# Patient Record
Sex: Male | Born: 1935 | Race: White | Hispanic: No | Marital: Married | Smoking: Former smoker
Health system: Southern US, Community
[De-identification: ages and names within clinical notes are randomized; demographics above are authoritative.]

## PROBLEM LIST (undated history)

## (undated) DIAGNOSIS — I499 Cardiac arrhythmia, unspecified: Secondary | ICD-10-CM

## (undated) DIAGNOSIS — H9190 Unspecified hearing loss, unspecified ear: Secondary | ICD-10-CM

## (undated) DIAGNOSIS — C801 Malignant (primary) neoplasm, unspecified: Secondary | ICD-10-CM

## (undated) DIAGNOSIS — M1990 Unspecified osteoarthritis, unspecified site: Secondary | ICD-10-CM

## (undated) DIAGNOSIS — M25559 Pain in unspecified hip: Secondary | ICD-10-CM

## (undated) DIAGNOSIS — C4491 Basal cell carcinoma of skin, unspecified: Secondary | ICD-10-CM

## (undated) DIAGNOSIS — C3490 Malignant neoplasm of unspecified part of unspecified bronchus or lung: Secondary | ICD-10-CM

## (undated) HISTORY — PX: SKIN CANCER EXCISION: SHX779

## (undated) HISTORY — DX: Malignant neoplasm of unspecified part of unspecified bronchus or lung: C34.90

## (undated) HISTORY — PX: ROTATOR CUFF REPAIR: SHX139

## (undated) HISTORY — PX: JOINT REPLACEMENT: SHX530

## (undated) HISTORY — PX: COLONOSCOPY: SHX174

---

## 2008-10-23 ENCOUNTER — Ambulatory Visit: Payer: Self-pay

## 2014-05-06 DIAGNOSIS — E785 Hyperlipidemia, unspecified: Secondary | ICD-10-CM | POA: Insufficient documentation

## 2016-03-29 DIAGNOSIS — I340 Nonrheumatic mitral (valve) insufficiency: Secondary | ICD-10-CM | POA: Insufficient documentation

## 2016-04-02 ENCOUNTER — Ambulatory Visit: Payer: Self-pay | Admitting: Cardiovascular Disease

## 2016-05-26 DIAGNOSIS — I4819 Other persistent atrial fibrillation: Secondary | ICD-10-CM | POA: Diagnosis present

## 2016-06-28 ENCOUNTER — Other Ambulatory Visit: Payer: Self-pay | Admitting: Orthopedic Surgery

## 2016-06-28 DIAGNOSIS — M2170 Unequal limb length (acquired), unspecified site: Secondary | ICD-10-CM

## 2016-07-06 ENCOUNTER — Ambulatory Visit
Admission: RE | Admit: 2016-07-06 | Discharge: 2016-07-06 | Disposition: A | Discharge status: Home / Self Care | Payer: Medicare Other | Source: Ambulatory Visit | Attending: Orthopedic Surgery | Admitting: Orthopedic Surgery

## 2016-07-06 DIAGNOSIS — M2170 Unequal limb length (acquired), unspecified site: Secondary | ICD-10-CM | POA: Insufficient documentation

## 2016-07-06 DIAGNOSIS — R2689 Other abnormalities of gait and mobility: Secondary | ICD-10-CM | POA: Diagnosis present

## 2017-07-11 NOTE — Discharge Instructions (Signed)

## 2017-07-13 ENCOUNTER — Encounter
Admission: RE | Disposition: A | Discharge status: Home / Self Care | Payer: Self-pay | Source: Ambulatory Visit | Attending: Ophthalmology

## 2017-07-13 ENCOUNTER — Ambulatory Visit: Payer: Medicare Other | Admitting: Anesthesiology

## 2017-07-13 ENCOUNTER — Ambulatory Visit
Admission: RE | Admit: 2017-07-13 | Discharge: 2017-07-13 | Disposition: A | Discharge status: Home / Self Care | Payer: Medicare Other | Source: Ambulatory Visit | Attending: Ophthalmology | Admitting: Ophthalmology

## 2017-07-13 DIAGNOSIS — I4891 Unspecified atrial fibrillation: Secondary | ICD-10-CM | POA: Insufficient documentation

## 2017-07-13 DIAGNOSIS — H2511 Age-related nuclear cataract, right eye: Secondary | ICD-10-CM | POA: Insufficient documentation

## 2017-07-13 DIAGNOSIS — G8929 Other chronic pain: Secondary | ICD-10-CM | POA: Insufficient documentation

## 2017-07-13 DIAGNOSIS — Z87891 Personal history of nicotine dependence: Secondary | ICD-10-CM | POA: Insufficient documentation

## 2017-07-13 DIAGNOSIS — E7800 Pure hypercholesterolemia, unspecified: Secondary | ICD-10-CM | POA: Insufficient documentation

## 2017-07-13 DIAGNOSIS — Z7901 Long term (current) use of anticoagulants: Secondary | ICD-10-CM | POA: Insufficient documentation

## 2017-07-13 DIAGNOSIS — Z79899 Other long term (current) drug therapy: Secondary | ICD-10-CM | POA: Diagnosis not present

## 2017-07-13 DIAGNOSIS — Z85828 Personal history of other malignant neoplasm of skin: Secondary | ICD-10-CM | POA: Diagnosis not present

## 2017-07-13 DIAGNOSIS — M25552 Pain in left hip: Secondary | ICD-10-CM | POA: Insufficient documentation

## 2017-07-13 HISTORY — PX: CATARACT EXTRACTION W/PHACO: SHX586

## 2017-07-13 HISTORY — DX: Unspecified hearing loss, unspecified ear: H91.90

## 2017-07-13 HISTORY — DX: Malignant (primary) neoplasm, unspecified: C80.1

## 2017-07-13 HISTORY — DX: Cardiac arrhythmia, unspecified: I49.9

## 2017-07-13 HISTORY — DX: Pain in unspecified hip: M25.559

## 2017-07-13 SURGERY — PHACOEMULSIFICATION, CATARACT, WITH IOL INSERTION
Anesthesia: Monitor Anesthesia Care | Laterality: Right | Wound class: Clean

## 2017-07-13 MED ORDER — LIDOCAINE HCL (PF) 2 % IJ SOLN
INTRAOCULAR | Status: DC | PRN
  Administered 2017-07-13: 1 mL

## 2017-07-13 MED ORDER — ACETAMINOPHEN 325 MG PO TABS: 325.0000 mg | ORAL_TABLET | ORAL | Status: DC | PRN

## 2017-07-13 MED ORDER — NA HYALUR & NA CHOND-NA HYALUR 0.4-0.35 ML IO KIT
PACK | INTRAOCULAR | Status: DC | PRN
  Administered 2017-07-13: 1 mL via INTRAOCULAR

## 2017-07-13 MED ORDER — BRIMONIDINE TARTRATE-TIMOLOL 0.2-0.5 % OP SOLN
OPHTHALMIC | Status: DC | PRN
  Administered 2017-07-13: 1 [drp] via OPHTHALMIC

## 2017-07-13 MED ORDER — ACETAMINOPHEN 160 MG/5ML PO SOLN: 325.0000 mg | ORAL | Status: DC | PRN

## 2017-07-13 MED ORDER — EPINEPHRINE PF 1 MG/ML IJ SOLN
INTRAOCULAR | Status: DC | PRN
  Administered 2017-07-13: 90 mL via OPHTHALMIC

## 2017-07-13 MED ORDER — FENTANYL CITRATE (PF) 100 MCG/2ML IJ SOLN
INTRAMUSCULAR | Status: DC | PRN
  Administered 2017-07-13: 50 ug via INTRAVENOUS

## 2017-07-13 MED ORDER — CEFUROXIME OPHTHALMIC INJECTION 1 MG/0.1 ML
INJECTION | OPHTHALMIC | Status: DC | PRN
  Administered 2017-07-13: 0.1 mL via INTRACAMERAL

## 2017-07-13 MED ORDER — LACTATED RINGERS IV SOLN: 1000.0000 mL | INTRAVENOUS | Status: DC

## 2017-07-13 MED ORDER — ARMC OPHTHALMIC DILATING DROPS
1.0000 "application " | OPHTHALMIC | Status: DC | PRN
  Administered 2017-07-13 (×3): 1 via OPHTHALMIC

## 2017-07-13 MED ORDER — MIDAZOLAM HCL 2 MG/2ML IJ SOLN
INTRAMUSCULAR | Status: DC | PRN
  Administered 2017-07-13: 1.5 mg via INTRAVENOUS

## 2017-07-13 MED ORDER — MOXIFLOXACIN HCL 0.5 % OP SOLN
1.0000 [drp] | OPHTHALMIC | Status: DC | PRN
  Administered 2017-07-13 (×3): 1 [drp] via OPHTHALMIC

## 2017-07-13 SURGICAL SUPPLY — 25 items
CANNULA ANT/CHMB 27GA (MISCELLANEOUS) ×3 IMPLANT
CARTRIDGE ABBOTT (MISCELLANEOUS) IMPLANT
GLOVE SURG LX 7.5 STRW (GLOVE) ×2
GLOVE SURG LX STRL 7.5 STRW (GLOVE) ×1 IMPLANT
GLOVE SURG TRIUMPH 8.0 PF LTX (GLOVE) ×3 IMPLANT
GOWN STRL REUS W/ TWL LRG LVL3 (GOWN DISPOSABLE) ×2 IMPLANT
GOWN STRL REUS W/TWL LRG LVL3 (GOWN DISPOSABLE) ×4
LENS IOL TECNIS ITEC 21.0 (Intraocular Lens) ×3 IMPLANT
MARKER SKIN DUAL TIP RULER LAB (MISCELLANEOUS) ×3 IMPLANT
NDL RETROBULBAR .5 NSTRL (NEEDLE) IMPLANT
NEEDLE FILTER BLUNT 18X 1/2SAF (NEEDLE) ×2
NEEDLE FILTER BLUNT 18X1 1/2 (NEEDLE) ×1 IMPLANT
PACK CATARACT BRASINGTON (MISCELLANEOUS) ×3 IMPLANT
PACK EYE AFTER SURG (MISCELLANEOUS) ×3 IMPLANT
PACK OPTHALMIC (MISCELLANEOUS) ×3 IMPLANT
RING MALYGIN 7.0 (MISCELLANEOUS) IMPLANT
SUT ETHILON 10-0 CS-B-6CS-B-6 (SUTURE)
SUT VICRYL  9 0 (SUTURE)
SUT VICRYL 9 0 (SUTURE) IMPLANT
SUTURE EHLN 10-0 CS-B-6CS-B-6 (SUTURE) IMPLANT
SYR 3ML LL SCALE MARK (SYRINGE) ×3 IMPLANT
SYR 5ML LL (SYRINGE) ×3 IMPLANT
SYR TB 1ML LUER SLIP (SYRINGE) ×3 IMPLANT
WATER STERILE IRR 250ML POUR (IV SOLUTION) ×3 IMPLANT
WIPE NON LINTING 3.25X3.25 (MISCELLANEOUS) ×3 IMPLANT

## 2017-07-13 NOTE — Anesthesia Postprocedure Evaluation (Signed)
Anesthesia Post Note  Patient: Jason Warren  Procedure(s) Performed: CATARACT EXTRACTION PHACO AND INTRAOCULAR LENS PLACEMENT (IOC) (Right )  Patient location during evaluation: PACU Anesthesia Type: MAC Level of consciousness: awake and alert Pain management: pain level controlled Vital Signs Assessment: post-procedure vital signs reviewed and stable Respiratory status: spontaneous breathing, nonlabored ventilation, respiratory function stable and patient connected to nasal cannula oxygen Cardiovascular status: stable and blood pressure returned to baseline Postop Assessment: no apparent nausea or vomiting Anesthetic complications: no    Witt Plitt D Shaterria Sager

## 2017-07-13 NOTE — Anesthesia Procedure Notes (Signed)
Procedure Name: MAC Performed by: Mayme Genta Pre-anesthesia Checklist: Patient identified, Emergency Drugs available, Suction available, Timeout performed and Patient being monitored Patient Re-evaluated:Patient Re-evaluated prior to induction Oxygen Delivery Method: Nasal cannula Placement Confirmation: positive ETCO2

## 2017-07-13 NOTE — Transfer of Care (Signed)
Immediate Anesthesia Transfer of Care Note  Patient: Jason Warren  Procedure(s) Performed: CATARACT EXTRACTION PHACO AND INTRAOCULAR LENS PLACEMENT (IOC) (Right )  Patient Location: PACU  Anesthesia Type: MAC  Level of Consciousness: awake, alert  and patient cooperative  Airway and Oxygen Therapy: Patient Spontanous Breathing and Patient connected to supplemental oxygen  Post-op Assessment: Post-op Vital signs reviewed, Patient's Cardiovascular Status Stable, Respiratory Function Stable, Patent Airway and No signs of Nausea or vomiting  Post-op Vital Signs: Reviewed and stable  Complications: No apparent anesthesia complications

## 2017-07-13 NOTE — H&P (Signed)
The History and Physical notes are on paper, have been signed, and are to be scanned. The patient remains stable and unchanged from the H&P.   Previous H&P reviewed, patient examined, and there are no changes.  Tramya Schoenfelder 07/13/2017 10:45 AM

## 2017-07-13 NOTE — Op Note (Signed)
LOCATION:  [facility]   PREOPERATIVE DIAGNOSIS:    Nuclear sclerotic cataract right eye. H25.11   POSTOPERATIVE DIAGNOSIS:  Nuclear sclerotic cataract right eye.     PROCEDURE:  Phacoemusification with posterior chamber intraocular lens placement of the right eye   LENS:   Implant Name Type Inv. Item Serial No. Manufacturer Lot No. LRB No. Used  LENS IOL DIOP 21.0 - [id number] Intraocular Lens LENS IOL DIOP 21.0 [id number]   Right 1       ULTRASOUND TIME: 18 % of 0 minutes, 54 seconds.  CDE 9.9   SURGEON:  Wyonia Hough, MD   ANESTHESIA:  Topical with tetracaine drops and 2% Xylocaine jelly, augmented with 1% preservative-free intracameral lidocaine.    COMPLICATIONS:  None.   DESCRIPTION OF PROCEDURE:  The patient was identified in the holding room and transported to the operating room and placed in the supine position under the operating microscope.  The right eye was identified as the operative eye and it was prepped and draped in the usual sterile ophthalmic fashion.   A 1 millimeter clear-corneal paracentesis was made at the 12:00 position.  0.5 ml of preservative-free 1% lidocaine was injected into the anterior chamber. The anterior chamber was filled with Viscoat viscoelastic.  A 2.4 millimeter keratome was used to make a near-clear corneal incision at the 9:00 position.  A curvilinear capsulorrhexis was made with a cystotome and capsulorrhexis forceps.  Balanced salt solution was used to hydrodissect and hydrodelineate the nucleus.   Phacoemulsification was then used in stop and chop fashion to remove the lens nucleus and epinucleus.  The remaining cortex was then removed using the irrigation and aspiration handpiece. Provisc was then placed into the capsular bag to distend it for lens placement.  A lens was then injected into the capsular bag.  The remaining viscoelastic was aspirated.   Wounds were hydrated with balanced salt solution.  The anterior  chamber was inflated to a physiologic pressure with balanced salt solution.  No wound leaks were noted. Cefuroxime 0.1 ml of a 10mg /ml solution was injected into the anterior chamber for a dose of 1 mg of intracameral antibiotic at the completion of the case.   Timolol and Brimonidine drops were applied to the eye.  The patient was taken to the recovery room in stable condition without complications of anesthesia or surgery.   Hanley Woerner 07/13/2017, 11:17 AM

## 2017-07-13 NOTE — Anesthesia Preprocedure Evaluation (Signed)
Anesthesia Evaluation  Patient identified by MRN, date of birth, ID band Patient awake    Reviewed: Allergy & Precautions, H&P , NPO status , Patient's Chart, lab work & pertinent test results, reviewed documented beta blocker date and time   Airway Mallampati: II  TM Distance: >3 FB Neck ROM: full    Dental no notable dental hx.    Pulmonary former smoker,    Pulmonary exam normal breath sounds clear to auscultation       Cardiovascular Exercise Tolerance: Good Atrial Fibrillation  Rhythm:regular Rate:Normal     Neuro/Psych negative neurological ROS  negative psych ROS   GI/Hepatic negative GI ROS, Neg liver ROS,   Endo/Other  negative endocrine ROS  Renal/GU negative Renal ROS  negative genitourinary   Musculoskeletal   Abdominal   Peds  Hematology negative hematology ROS (+)   Anesthesia Other Findings   Reproductive/Obstetrics negative OB ROS                             Anesthesia Physical Anesthesia Plan  ASA: II  Anesthesia Plan: MAC   Post-op Pain Management:    Induction:   PONV Risk Score and Plan:   Airway Management Planned:   Additional Equipment:   Intra-op Plan:   Post-operative Plan:   Informed Consent: I have reviewed the patients History and Physical, chart, labs and discussed the procedure including the risks, benefits and alternatives for the proposed anesthesia with the patient or authorized representative who has indicated his/her understanding and acceptance.     Plan Discussed with: CRNA  Anesthesia Plan Comments:         Anesthesia Quick Evaluation

## 2017-07-14 ENCOUNTER — Encounter: Payer: Self-pay | Admitting: Ophthalmology

## 2017-12-26 ENCOUNTER — Encounter: Payer: Self-pay | Admitting: *Deleted

## 2017-12-26 ENCOUNTER — Other Ambulatory Visit: Payer: Self-pay

## 2017-12-29 NOTE — Discharge Instructions (Signed)

## 2018-01-04 ENCOUNTER — Ambulatory Visit: Payer: Medicare Other | Admitting: Anesthesiology

## 2018-01-04 ENCOUNTER — Ambulatory Visit
Admission: RE | Admit: 2018-01-04 | Discharge: 2018-01-04 | Disposition: A | Discharge status: Home / Self Care | Payer: Medicare Other | Source: Ambulatory Visit | Attending: Ophthalmology | Admitting: Ophthalmology

## 2018-01-04 ENCOUNTER — Encounter
Admission: RE | Disposition: A | Discharge status: Home / Self Care | Payer: Self-pay | Source: Ambulatory Visit | Attending: Ophthalmology

## 2018-01-04 DIAGNOSIS — H2512 Age-related nuclear cataract, left eye: Secondary | ICD-10-CM | POA: Diagnosis present

## 2018-01-04 DIAGNOSIS — Z7902 Long term (current) use of antithrombotics/antiplatelets: Secondary | ICD-10-CM | POA: Insufficient documentation

## 2018-01-04 DIAGNOSIS — I4891 Unspecified atrial fibrillation: Secondary | ICD-10-CM | POA: Insufficient documentation

## 2018-01-04 DIAGNOSIS — Z79899 Other long term (current) drug therapy: Secondary | ICD-10-CM | POA: Diagnosis not present

## 2018-01-04 DIAGNOSIS — E7800 Pure hypercholesterolemia, unspecified: Secondary | ICD-10-CM | POA: Diagnosis not present

## 2018-01-04 DIAGNOSIS — Z87891 Personal history of nicotine dependence: Secondary | ICD-10-CM | POA: Insufficient documentation

## 2018-01-04 DIAGNOSIS — Z85828 Personal history of other malignant neoplasm of skin: Secondary | ICD-10-CM | POA: Diagnosis not present

## 2018-01-04 HISTORY — PX: CATARACT EXTRACTION W/PHACO: SHX586

## 2018-01-04 SURGERY — PHACOEMULSIFICATION, CATARACT, WITH IOL INSERTION
Anesthesia: Monitor Anesthesia Care | Site: Eye | Laterality: Left | Wound class: "Clean "

## 2018-01-04 MED ORDER — ARMC OPHTHALMIC DILATING DROPS
1.0000 "application " | OPHTHALMIC | Status: DC | PRN
  Administered 2018-01-04 (×3): 1 via OPHTHALMIC

## 2018-01-04 MED ORDER — EPINEPHRINE PF 1 MG/ML IJ SOLN
INTRAOCULAR | Status: DC | PRN
  Administered 2018-01-04: 48 mL via OPHTHALMIC

## 2018-01-04 MED ORDER — NA HYALUR & NA CHOND-NA HYALUR 0.4-0.35 ML IO KIT
PACK | INTRAOCULAR | Status: DC | PRN
  Administered 2018-01-04: 1 mL via INTRAOCULAR

## 2018-01-04 MED ORDER — LACTATED RINGERS IV SOLN: INTRAVENOUS | Status: DC

## 2018-01-04 MED ORDER — OXYCODONE HCL 5 MG PO TABS: 5.0000 mg | ORAL_TABLET | Freq: Once | ORAL | Status: DC | PRN

## 2018-01-04 MED ORDER — BRIMONIDINE TARTRATE-TIMOLOL 0.2-0.5 % OP SOLN
OPHTHALMIC | Status: DC | PRN
  Administered 2018-01-04: 1 [drp] via OPHTHALMIC

## 2018-01-04 MED ORDER — BALANCED SALT IO SOLN
INTRAOCULAR | Status: DC | PRN
  Administered 2018-01-04: 1 mL

## 2018-01-04 MED ORDER — MOXIFLOXACIN HCL 0.5 % OP SOLN
1.0000 [drp] | OPHTHALMIC | Status: DC | PRN
  Administered 2018-01-04 (×3): 1 [drp] via OPHTHALMIC

## 2018-01-04 MED ORDER — OXYCODONE HCL 5 MG/5ML PO SOLN: 5.0000 mg | Freq: Once | ORAL | Status: DC | PRN

## 2018-01-04 MED ORDER — FENTANYL CITRATE (PF) 100 MCG/2ML IJ SOLN
INTRAMUSCULAR | Status: DC | PRN
  Administered 2018-01-04: 50 ug via INTRAVENOUS

## 2018-01-04 MED ORDER — MIDAZOLAM HCL 2 MG/2ML IJ SOLN
INTRAMUSCULAR | Status: DC | PRN
  Administered 2018-01-04: 1 mg via INTRAVENOUS
  Administered 2018-01-04: .5 mg via INTRAVENOUS

## 2018-01-04 MED ORDER — CEFUROXIME OPHTHALMIC INJECTION 1 MG/0.1 ML
INJECTION | OPHTHALMIC | Status: DC | PRN
  Administered 2018-01-04: 0.1 mL via INTRACAMERAL

## 2018-01-04 SURGICAL SUPPLY — 20 items
CANNULA ANT/CHMB 27G (MISCELLANEOUS) ×1 IMPLANT
CANNULA ANT/CHMB 27GA (MISCELLANEOUS) ×3 IMPLANT
GLOVE SURG LX 7.5 STRW (GLOVE) ×2
GLOVE SURG LX STRL 7.5 STRW (GLOVE) ×1 IMPLANT
GLOVE SURG TRIUMPH 8.0 PF LTX (GLOVE) ×3 IMPLANT
GOWN STRL REUS W/ TWL LRG LVL3 (GOWN DISPOSABLE) ×2 IMPLANT
GOWN STRL REUS W/TWL LRG LVL3 (GOWN DISPOSABLE) ×4
LENS IOL TECNIS ITEC 20.5 (Intraocular Lens) ×2 IMPLANT
MARKER SKIN DUAL TIP RULER LAB (MISCELLANEOUS) ×3 IMPLANT
NDL FILTER BLUNT 18X1 1/2 (NEEDLE) ×1 IMPLANT
NEEDLE FILTER BLUNT 18X 1/2SAF (NEEDLE) ×2
NEEDLE FILTER BLUNT 18X1 1/2 (NEEDLE) ×1 IMPLANT
PACK CATARACT BRASINGTON (MISCELLANEOUS) ×3 IMPLANT
PACK EYE AFTER SURG (MISCELLANEOUS) ×3 IMPLANT
PACK OPTHALMIC (MISCELLANEOUS) ×3 IMPLANT
SYR 3ML LL SCALE MARK (SYRINGE) ×3 IMPLANT
SYR 5ML LL (SYRINGE) ×3 IMPLANT
SYR TB 1ML LUER SLIP (SYRINGE) ×3 IMPLANT
WATER STERILE IRR 500ML POUR (IV SOLUTION) ×3 IMPLANT
WIPE NON LINTING 3.25X3.25 (MISCELLANEOUS) ×3 IMPLANT

## 2018-01-04 NOTE — Transfer of Care (Signed)
Immediate Anesthesia Transfer of Care Note  Patient: Jason Warren  Procedure(s) Performed: CATARACT EXTRACTION PHACO AND INTRAOCULAR LENS PLACEMENT (IOC) LEFT (Left Eye)  Patient Location: PACU  Anesthesia Type: MAC  Level of Consciousness: awake, alert  and patient cooperative  Airway and Oxygen Therapy: Patient Spontanous Breathing and Patient connected to supplemental oxygen  Post-op Assessment: Post-op Vital signs reviewed, Patient's Cardiovascular Status Stable, Respiratory Function Stable, Patent Airway and No signs of Nausea or vomiting  Post-op Vital Signs: Reviewed and stable  Complications: No apparent anesthesia complications

## 2018-01-04 NOTE — Anesthesia Postprocedure Evaluation (Signed)
Anesthesia Post Note  Patient: Jason Warren  Procedure(s) Performed: CATARACT EXTRACTION PHACO AND INTRAOCULAR LENS PLACEMENT (IOC) LEFT (Left Eye)  Patient location during evaluation: PACU Anesthesia Type: MAC Level of consciousness: awake and alert Pain management: pain level controlled Vital Signs Assessment: post-procedure vital signs reviewed and stable Respiratory status: spontaneous breathing, nonlabored ventilation, respiratory function stable and patient connected to nasal cannula oxygen Cardiovascular status: stable and blood pressure returned to baseline Postop Assessment: no apparent nausea or vomiting Anesthetic complications: no    Reedy Biernat

## 2018-01-04 NOTE — H&P (Signed)
The History and Physical notes are on paper, have been signed, and are to be scanned. The patient remains stable and unchanged from the H&P.   Previous H&P reviewed, patient examined, and there are no changes.  Jason Warren 01/04/2018 9:10 AM

## 2018-01-04 NOTE — Anesthesia Preprocedure Evaluation (Addendum)
Anesthesia Evaluation  Patient identified by MRN, date of birth, ID band  Reviewed: NPO status   History of Anesthesia Complications Negative for: history of anesthetic complications  Airway Mallampati: II  TM Distance: >3 FB Neck ROM: full    Dental no notable dental hx.    Pulmonary neg pulmonary ROS, former smoker,    Pulmonary exam normal        Cardiovascular Exercise Tolerance: Good negative cardio ROS Normal cardiovascular exam+ dysrhythmias Atrial Fibrillation    ekg: afib;  stress: 2017: LVEF= 64% Regional wall motion:reveals normal myocardial thickening and wall  motion. The overall quality of the study is fair. Artifacts noted: no Left ventricular cavity: normal.;  echo: 2017: NORMAL LEFT VENTRICULAR SYSTOLIC FUNCTION WITH MILD LVH NORMAL RIGHT VENTRICULAR SYSTOLIC FUNCTION MODERATE VALVULAR REGURGITATION (See above) NO VALVULAR STENOSIS MILD to MODERATE PHTN EF 55-60%;  last eliquis: 4/3;     Neuro/Psych negative neurological ROS  negative psych ROS   GI/Hepatic negative GI ROS, Neg liver ROS,   Endo/Other  negative endocrine ROS  Renal/GU negative Renal ROS  negative genitourinary   Musculoskeletal   Abdominal   Peds  Hematology negative hematology ROS (+)   Anesthesia Other Findings   Reproductive/Obstetrics                            Anesthesia Physical Anesthesia Plan  ASA: II  Anesthesia Plan: MAC   Post-op Pain Management:    Induction:   PONV Risk Score and Plan:   Airway Management Planned:   Additional Equipment:   Intra-op Plan:   Post-operative Plan:   Informed Consent: I have reviewed the patients History and Physical, chart, labs and discussed the procedure including the risks, benefits and alternatives for the proposed anesthesia with the patient or authorized representative who has indicated his/her understanding and  acceptance.     Plan Discussed with: CRNA  Anesthesia Plan Comments:        Anesthesia Quick Evaluation

## 2018-01-04 NOTE — Anesthesia Procedure Notes (Signed)
Procedure Name: MAC Performed by: Izabella Marcantel, CRNA Pre-anesthesia Checklist: Patient identified, Emergency Drugs available, Suction available, Timeout performed and Patient being monitored Patient Re-evaluated:Patient Re-evaluated prior to induction Oxygen Delivery Method: Nasal cannula Placement Confirmation: positive ETCO2       

## 2018-01-04 NOTE — Op Note (Signed)
OPERATIVE NOTE  Jason Warren [id number] 01/04/2018   PREOPERATIVE DIAGNOSIS:  Nuclear sclerotic cataract left eye. H25.12   POSTOPERATIVE DIAGNOSIS:    Nuclear sclerotic cataract left eye.     PROCEDURE:  Phacoemusification with posterior chamber intraocular lens placement of the left eye   LENS:   Implant Name Type Inv. Item Serial No. Manufacturer Lot No. LRB No. Used  LENS IOL DIOP 20.5 - [id number] Intraocular Lens LENS IOL DIOP 20.5 [id number] AMO  Left 1        ULTRASOUND TIME: 16  % of 0 minutes 53 seconds, CDE 8.7  SURGEON:  Wyonia Hough, MD   ANESTHESIA:  Topical with tetracaine drops and 2% Xylocaine jelly, augmented with 1% preservative-free intracameral lidocaine.    COMPLICATIONS:  None.   DESCRIPTION OF PROCEDURE:  The patient was identified in the holding room and transported to the operating room and placed in the supine position under the operating microscope.  The left eye was identified as the operative eye and it was prepped and draped in the usual sterile ophthalmic fashion.   A 1 millimeter clear-corneal paracentesis was made at the 4:30 position.  0.5 ml of preservative-free 1% lidocaine was injected into the anterior chamber.  The anterior chamber was filled with Viscoat viscoelastic.  A 2.4 millimeter keratome was used to make a near-clear corneal incision at the 1:30 position.  .  A curvilinear capsulorrhexis was made with a cystotome and capsulorrhexis forceps.  Balanced salt solution was used to hydrodissect and hydrodelineate the nucleus.   Phacoemulsification was then used in stop and chop fashion to remove the lens nucleus and epinucleus.  The remaining cortex was then removed using the irrigation and aspiration handpiece. Provisc was then placed into the capsular bag to distend it for lens placement.  A lens was then injected into the capsular bag.  The remaining viscoelastic was aspirated.   Wounds were hydrated with balanced salt  solution.  The anterior chamber was inflated to a physiologic pressure with balanced salt solution.  No wound leaks were noted. Cefuroxime 0.1 ml of a 10mg /ml solution was injected into the anterior chamber for a dose of 1 mg of intracameral antibiotic at the completion of the case.   Timolol and Brimonidine drops were applied to the eye.  The patient was taken to the recovery room in stable condition without complications of anesthesia or surgery.  [name] 01/04/2018, 9:58 AM

## 2018-01-05 ENCOUNTER — Encounter: Payer: Self-pay | Admitting: Ophthalmology

## 2018-07-24 ENCOUNTER — Other Ambulatory Visit: Payer: Self-pay | Admitting: Internal Medicine

## 2018-07-24 DIAGNOSIS — I4819 Other persistent atrial fibrillation: Secondary | ICD-10-CM

## 2018-07-24 DIAGNOSIS — Z01818 Encounter for other preprocedural examination: Secondary | ICD-10-CM

## 2018-07-27 ENCOUNTER — Ambulatory Visit
Admission: RE | Admit: 2018-07-27 | Discharge: 2018-07-27 | Disposition: A | Discharge status: Home / Self Care | Payer: Medicare Other | Source: Ambulatory Visit | Attending: Internal Medicine | Admitting: Internal Medicine

## 2018-07-27 DIAGNOSIS — I4819 Other persistent atrial fibrillation: Secondary | ICD-10-CM | POA: Diagnosis not present

## 2018-07-27 DIAGNOSIS — Z01818 Encounter for other preprocedural examination: Secondary | ICD-10-CM

## 2018-07-27 DIAGNOSIS — Z01810 Encounter for preprocedural cardiovascular examination: Secondary | ICD-10-CM | POA: Insufficient documentation

## 2018-07-27 DIAGNOSIS — I081 Rheumatic disorders of both mitral and tricuspid valves: Secondary | ICD-10-CM | POA: Insufficient documentation

## 2018-07-27 NOTE — Progress Notes (Signed)
*  PRELIMINARY RESULTS* Echocardiogram 2D Echocardiogram has been performed.  Jason Warren Aure 07/27/2018, 11:23 AM

## 2018-07-28 ENCOUNTER — Other Ambulatory Visit: Payer: Self-pay | Admitting: Orthopedic Surgery

## 2018-08-01 ENCOUNTER — Other Ambulatory Visit: Payer: Self-pay | Admitting: Orthopedic Surgery

## 2018-08-01 NOTE — Patient Instructions (Addendum)
Jason Warren  08/01/2018   Your procedure is scheduled on: 08-04-18   Report to [facility] Main  Entrance           Report to admitting at     1030 AM    Call this number if you have problems the morning of surgery [phone]    Remember: Do not eat food  :After Midnight. You may have clear liquids until 0700 am then nothing by mouth    CLEAR LIQUID DIET   Foods Allowed                                                                     Foods Excluded  Coffee and tea, regular and decaf                             liquids that you cannot  Plain Jell-O in any flavor                                             see through such as: Fruit ices (not with fruit pulp)                                     milk, soups, orange juice  Iced Popsicles                                    All solid food Carbonated beverages, regular and diet                                    Cranberry, grape and apple juices Sports drinks like Gatorade Lightly seasoned clear broth or consume(fat free) Sugar, honey syrup  _____________________________________________________________________   BRUSH YOUR TEETH MORNING OF SURGERY AND RINSE YOUR MOUTH OUT, NO CHEWING GUM CANDY OR MINTS.     Take these medicines the morning of surgery with A SIP OF WATER: tylenol if needed                                You may not have any metal on your body including hair pins and              piercings  Do not wear jewelry, lotions, powders or perfumes, deodorant                        Men may shave face and neck.   Do not bring valuables to the hospital. [place].  Contacts, dentures or bridgework may not be worn into surgery.  Leave suitcase in the car. After surgery  it may be brought to your room.                 Please read over the following fact sheets you were  given: _____________________________________________________________________           [facility] - Preparing for Surgery Before surgery, you can play an important role.  Because skin is not sterile, your skin needs to be as free of germs as possible.  You can reduce the number of germs on your skin by washing with CHG (chlorahexidine gluconate) soap before surgery.  CHG is an antiseptic cleaner which kills germs and bonds with the skin to continue killing germs even after washing. Please DO NOT use if you have an allergy to CHG or antibacterial soaps.  If your skin becomes reddened/irritated stop using the CHG and inform your nurse when you arrive at Short Stay. Do not shave (including legs and underarms) for at least 48 hours prior to the first CHG shower.  You may shave your face/neck. Please follow these instructions carefully:  1.  Shower with CHG Soap the night before surgery and the  morning of Surgery.  2.  If you choose to wash your hair, wash your hair first as usual with your  normal  shampoo.  3.  After you shampoo, rinse your hair and body thoroughly to remove the  shampoo.                           4.  Use CHG as you would any other liquid soap.  You can apply chg directly  to the skin and wash                       Gently with a scrungie or clean washcloth.  5.  Apply the CHG Soap to your body ONLY FROM THE NECK DOWN.   Do not use on face/ open                           Wound or open sores. Avoid contact with eyes, ears mouth and genitals (private parts).                       Wash face,  Genitals (private parts) with your normal soap.             6.  Wash thoroughly, paying special attention to the area where your surgery  will be performed.  7.  Thoroughly rinse your body with warm water from the neck down.  8.  DO NOT shower/wash with your normal soap after using and rinsing off  the CHG Soap.                9.  Pat yourself dry with a clean towel.            10.  Wear clean  pajamas.            11.  Place clean sheets on your bed the night of your first shower and do not  sleep with pets. Day of Surgery : Do not apply any lotions/deodorants the morning of surgery.  Please wear clean clothes to the hospital/surgery center.  FAILURE TO FOLLOW THESE INSTRUCTIONS MAY RESULT IN THE CANCELLATION OF YOUR SURGERY PATIENT SIGNATURE_________________________________  NURSE SIGNATURE__________________________________  ________________________________________________________________________  WHAT IS A BLOOD TRANSFUSION? Blood Transfusion Information  A transfusion is  the replacement of blood or some of its parts. Blood is made up of multiple cells which provide different functions.  Red blood cells carry oxygen and are used for blood loss replacement.  White blood cells fight against infection.  Platelets control bleeding.  Plasma helps clot blood.  Other blood products are available for specialized needs, such as hemophilia or other clotting disorders. BEFORE THE TRANSFUSION  Who gives blood for transfusions?   Healthy volunteers who are fully evaluated to make sure their blood is safe. This is blood bank blood. Transfusion therapy is the safest it has ever been in the practice of medicine. Before blood is taken from a donor, a complete history is taken to make sure that person has no history of diseases nor engages in risky social behavior (examples are intravenous drug use or sexual activity with multiple partners). The donor's travel history is screened to minimize risk of transmitting infections, such as malaria. The donated blood is tested for signs of infectious diseases, such as HIV and hepatitis. The blood is then tested to be sure it is compatible with you in order to minimize the chance of a transfusion reaction. If you or a relative donates blood, this is often done in anticipation of surgery and is not appropriate for emergency situations. It takes many days  to process the donated blood. RISKS AND COMPLICATIONS Although transfusion therapy is very safe and saves many lives, the main dangers of transfusion include:   Getting an infectious disease.  Developing a transfusion reaction. This is an allergic reaction to something in the blood you were given. Every precaution is taken to prevent this. The decision to have a blood transfusion has been considered carefully by your caregiver before blood is given. Blood is not given unless the benefits outweigh the risks. AFTER THE TRANSFUSION  Right after receiving a blood transfusion, you will usually feel much better and more energetic. This is especially true if your red blood cells have gotten low (anemic). The transfusion raises the level of the red blood cells which carry oxygen, and this usually causes an energy increase.  The nurse administering the transfusion will monitor you carefully for complications. HOME CARE INSTRUCTIONS  No special instructions are needed after a transfusion. You may find your energy is better. Speak with your caregiver about any limitations on activity for underlying diseases you may have. SEEK MEDICAL CARE IF:   Your condition is not improving after your transfusion.  You develop redness or irritation at the intravenous (IV) site. SEEK IMMEDIATE MEDICAL CARE IF:  Any of the following symptoms occur over the next 12 hours:  Shaking chills.  You have a temperature by mouth above 102 F (38.9 C), not controlled by medicine.  Chest, back, or muscle pain.  People around you feel you are not acting correctly or are confused.  Shortness of breath or difficulty breathing.  Dizziness and fainting.  You get a rash or develop hives.  You have a decrease in urine output.  Your urine turns a dark color or changes to pink, red, or brown. Any of the following symptoms occur over the next 10 days:  You have a temperature by mouth above 102 F (38.9 C), not controlled  by medicine.  Shortness of breath.  Weakness after normal activity.  The white part of the eye turns yellow (jaundice).  You have a decrease in the amount of urine or are urinating less often.  Your urine turns a dark color or changes  to pink, red, or brown. Document Released: 09/17/2000 Document Revised: 12/13/2011 Document Reviewed: 05/06/2008 ExitCare Patient Information 2014 [place].  _______________________________________________________________________  Incentive Spirometer  An incentive spirometer is a tool that can help keep your lungs clear and active. This tool measures how well you are filling your lungs with each breath. Taking long deep breaths may help reverse or decrease the chance of developing breathing (pulmonary) problems (especially infection) following:  A long period of time when you are unable to move or be active. BEFORE THE PROCEDURE   If the spirometer includes an indicator to show your best effort, your nurse or respiratory therapist will set it to a desired goal.  If possible, sit up straight or lean slightly forward. Try not to slouch.  Hold the incentive spirometer in an upright position. INSTRUCTIONS FOR USE  1. Sit on the edge of your bed if possible, or sit up as far as you can in bed or on a chair. 2. Hold the incentive spirometer in an upright position. 3. Breathe out normally. 4. Place the mouthpiece in your mouth and seal your lips tightly around it. 5. Breathe in slowly and as deeply as possible, raising the piston or the ball toward the top of the column. 6. Hold your breath for 3-5 seconds or for as long as possible. Allow the piston or ball to fall to the bottom of the column. 7. Remove the mouthpiece from your mouth and breathe out normally. 8. Rest for a few seconds and repeat Steps 1 through 7 at least 10 times every 1-2 hours when you are awake. Take your time and take a few normal breaths between deep breaths. 9. The  spirometer may include an indicator to show your best effort. Use the indicator as a goal to work toward during each repetition. 10. After each set of 10 deep breaths, practice coughing to be sure your lungs are clear. If you have an incision (the cut made at the time of surgery), support your incision when coughing by placing a pillow or rolled up towels firmly against it. Once you are able to get out of bed, walk around indoors and cough well. You may stop using the incentive spirometer when instructed by your caregiver.  RISKS AND COMPLICATIONS  Take your time so you do not get dizzy or light-headed.  If you are in pain, you may need to take or ask for pain medication before doing incentive spirometry. It is harder to take a deep breath if you are having pain. AFTER USE  Rest and breathe slowly and easily.  It can be helpful to keep track of a log of your progress. Your caregiver can provide you with a simple table to help with this. If you are using the spirometer at home, follow these instructions: [place] IF:   You are having difficultly using the spirometer.  You have trouble using the spirometer as often as instructed.  Your pain medication is not giving enough relief while using the spirometer.  You develop fever of 100.5 F (38.1 C) or higher. SEEK IMMEDIATE MEDICAL CARE IF:   You cough up bloody sputum that had not been present before.  You develop fever of 102 F (38.9 C) or greater.  You develop worsening pain at or near the incision site. MAKE SURE YOU:   Understand these instructions.  Will watch your condition.  Will get help right away if you are not doing well or get worse. Document Released: 01/31/2007  Document Revised: 12/13/2011 Document Reviewed: 04/03/2007 [facility] Patient Information 2014 [place], [place].   ________________________________________________________________________

## 2018-08-01 NOTE — Progress Notes (Addendum)
Clearance 07-20-18 epic Dr. Nehemiah Massed  Requested EKG and stress test [phone]

## 2018-08-01 NOTE — Care Plan (Signed)
Spoke with patient prior to surgery. He will discharge to home with wife and prefers to go straight to OPPT. Set up in [place] as he requested.   Please contact me with questions or concerns   Ladell Heads, [place]

## 2018-08-02 ENCOUNTER — Encounter (HOSPITAL_COMMUNITY)
Admission: RE | Admit: 2018-08-02 | Discharge: 2018-08-02 | Disposition: A | Discharge status: Home / Self Care | Payer: Medicare Other | Source: Ambulatory Visit | Attending: Orthopedic Surgery | Admitting: Orthopedic Surgery

## 2018-08-02 ENCOUNTER — Encounter (HOSPITAL_COMMUNITY): Payer: Self-pay

## 2018-08-02 ENCOUNTER — Other Ambulatory Visit: Payer: Self-pay

## 2018-08-02 DIAGNOSIS — Z01812 Encounter for preprocedural laboratory examination: Secondary | ICD-10-CM | POA: Insufficient documentation

## 2018-08-02 HISTORY — DX: Unspecified osteoarthritis, unspecified site: M19.90

## 2018-08-02 LAB — CBC WITH DIFFERENTIAL/PLATELET
Abs Immature Granulocytes: 0.02 10*3/uL (ref 0.00–0.07)
BASOS ABS: 0 10*3/uL (ref 0.0–0.1)
Basophils Relative: 0 %
EOS PCT: 1 %
Eosinophils Absolute: 0.1 10*3/uL (ref 0.0–0.5)
HCT: 45.5 % (ref 39.0–52.0)
Hemoglobin: 14.5 g/dL (ref 13.0–17.0)
Immature Granulocytes: 0 %
Lymphocytes Relative: 38 %
Lymphs Abs: 2 10*3/uL (ref 0.7–4.0)
MCH: 32 pg (ref 26.0–34.0)
MCHC: 31.9 g/dL (ref 30.0–36.0)
MCV: 100.4 fL — ABNORMAL HIGH (ref 80.0–100.0)
MONO ABS: 0.5 10*3/uL (ref 0.1–1.0)
Monocytes Relative: 9 %
NRBC: 0 % (ref 0.0–0.2)
Neutro Abs: 2.7 10*3/uL (ref 1.7–7.7)
Neutrophils Relative %: 52 %
Platelets: 253 10*3/uL (ref 150–400)
RBC: 4.53 MIL/uL (ref 4.22–5.81)
RDW: 13.1 % (ref 11.5–15.5)
WBC: 5.3 10*3/uL (ref 4.0–10.5)

## 2018-08-02 LAB — BASIC METABOLIC PANEL
Anion gap: 9 (ref 5–15)
BUN: 18 mg/dL (ref 8–23)
CALCIUM: 9.3 mg/dL (ref 8.9–10.3)
CO2: 28 mmol/L (ref 22–32)
CREATININE: 1.05 mg/dL (ref 0.61–1.24)
Chloride: 104 mmol/L (ref 98–111)
Glucose, Bld: 90 mg/dL (ref 70–99)
Potassium: 4.9 mmol/L (ref 3.5–5.1)
SODIUM: 141 mmol/L (ref 135–145)

## 2018-08-02 LAB — URINALYSIS, ROUTINE W REFLEX MICROSCOPIC
Bilirubin Urine: NEGATIVE
GLUCOSE, UA: NEGATIVE mg/dL
Hgb urine dipstick: NEGATIVE
Ketones, ur: NEGATIVE mg/dL
LEUKOCYTES UA: NEGATIVE
Nitrite: NEGATIVE
Protein, ur: NEGATIVE mg/dL
SPECIFIC GRAVITY, URINE: 1.006 (ref 1.005–1.030)
pH: 6 (ref 5.0–8.0)

## 2018-08-02 LAB — APTT: aPTT: 32 seconds (ref 24–36)

## 2018-08-02 LAB — SURGICAL PCR SCREEN
MRSA, PCR: NEGATIVE
STAPHYLOCOCCUS AUREUS: NEGATIVE

## 2018-08-02 LAB — PROTIME-INR
INR: 1
PROTHROMBIN TIME: 13.1 s (ref 11.4–15.2)

## 2018-08-02 NOTE — Progress Notes (Signed)
ekg 07-20-18 on chart

## 2018-08-03 DIAGNOSIS — M1612 Unilateral primary osteoarthritis, left hip: Secondary | ICD-10-CM | POA: Diagnosis present

## 2018-08-03 LAB — ABO/RH: ABO/RH(D): O POS

## 2018-08-03 MED ORDER — BUPIVACAINE LIPOSOME 1.3 % IJ SUSP
10.0000 mL | INTRAMUSCULAR | Status: DC
  Filled 2018-08-03: qty 20

## 2018-08-03 MED ORDER — TRANEXAMIC ACID 1000 MG/10ML IV SOLN
2000.0000 mg | INTRAVENOUS | Status: DC
  Filled 2018-08-03: qty 20

## 2018-08-03 NOTE — H&P (Signed)
TOTAL HIP ADMISSION H&P  Patient is admitted for left total hip arthroplasty.  Subjective:  Chief Complaint: left hip pain  HPI: Jason Warren, 84 y.o. male, has a history of pain and functional disability in the left hip(s) due to arthritis and patient has failed non-surgical conservative treatments for greater than 12 weeks to include NSAID's and/or analgesics, corticosteriod injections and activity modification.  Onset of symptoms was gradual starting 2 years ago with gradually worsening course since that time.The patient noted no past surgery on the left hip(s).  Patient currently rates pain in the left hip at 10 out of 10 with activity. Patient has night pain, worsening of pain with activity and weight bearing, trendelenberg gait, pain that interfers with activities of daily living and pain with passive range of motion. Patient has evidence of joint space narrowing by imaging studies. This condition presents safety issues increasing the risk of falls. There is no current active infection.  There are no active problems to display for this patient.  Past Medical History:  Diagnosis Date  . Arthritis   . 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 Dysrhythmia    A-fib  . Hip pain    left  . HOH (hard of hearing)     Past Surgical History:  Procedure Laterality Date  . CATARACT EXTRACTION W/PHACO Right 07/13/2017   Procedure: CATARACT EXTRACTION PHACO AND INTRAOCULAR LENS PLACEMENT ([place]);  Surgeon: Leandrew Koyanagi, MD;  Location: [place];  Service: Ophthalmology;  Laterality: Right;  IVA TOPICAL RIGHT  . CATARACT EXTRACTION W/PHACO Left 01/04/2018   Procedure: CATARACT EXTRACTION PHACO AND INTRAOCULAR LENS PLACEMENT ([place]) LEFT;  Surgeon: Leandrew Koyanagi, MD;  Location: [place];  Service: Ophthalmology;  Laterality: Left;  . COLONOSCOPY    . JOINT REPLACEMENT     Left total hip Dr. Su Hoff 08-04-18  . ROTATOR CUFF REPAIR Right    . SKIN CANCER EXCISION     face    Current Facility-Administered Medications  Medication Dose Route Frequency Provider Last Rate Last Dose  . [START ON 08/04/2018] bupivacaine liposome (EXPAREL) 1.3 % injection 133 mg  10 mL Infiltration On Call to OR Frederik Pear, MD      . Derrill Memo ON 08/04/2018] tranexamic acid (CYKLOKAPRON) 2,000 mg in sodium chloride 0.9 % 50 mL Topical Application  1,245 mg Topical To OR Frederik Pear, MD       Current Outpatient Medications  Medication Sig Dispense Refill Last Dose  . acetaminophen (TYLENOL) 500 MG tablet Take 750 mg by mouth every 8 (eight) hours as needed for moderate pain.     Marland Kitchen apixaban (ELIQUIS) 5 MG TABS tablet Take 5 mg by mouth 2 (two) times daily.   01/04/2018 at Unknown time  . hydroxypropyl methylcellulose / hypromellose (ISOPTO TEARS / GONIOVISC) 2.5 % ophthalmic solution Place 1 drop into the left eye daily as needed for dry eyes.     . Multiple Vitamin (MULTIVITAMIN) tablet Take 1 tablet by mouth daily.   Past Week at Unknown time  . pravastatin (PRAVACHOL) 40 MG tablet Take 40 mg by mouth at bedtime.   01/03/2018 at Unknown time  . saw palmetto 160 MG capsule Take 160 mg by mouth 2 (two) times daily.   Past Week at Unknown time   No Known Allergies  Social History   Tobacco Use  . Smoking status: Former Smoker    Packs/day: 1.00    Years: 4.00  Pack years: 4.00    Types: Cigarettes  . Smokeless tobacco: Never Used  . Tobacco comment: quit early 70's  Substance Use Topics  . Alcohol use: No    No family history on file.   Review of Systems  Constitutional: Negative.   HENT: Negative.   Eyes: Negative.   Respiratory: Negative.   Cardiovascular: Negative.   Gastrointestinal: Negative.   Genitourinary: Negative.   Musculoskeletal: Positive for joint pain and myalgias.  Skin: Negative.   Neurological: Negative.   Endo/Heme/Allergies: Bruises/bleeds easily.  Psychiatric/Behavioral: Negative.     Objective:  Physical Exam   Constitutional: He is oriented to person, place, and time. He appears well-developed and well-nourished.  HENT:  Head: Normocephalic and atraumatic.  Eyes: Pupils are equal, round, and reactive to light.  Neck: Normal range of motion. Neck supple.  Cardiovascular: Intact distal pulses.  Respiratory: Effort normal.  Musculoskeletal: He exhibits tenderness.  In a seated position the patient does not fact have a very irritable left hip internal rotation blocks at -5 and produces a pelvic shift as well as reproducing his pain.  In the supine position, internal rotation.  [place] also reproduces his pain.  He has about a 15, forward flexion contracture of the left hip.  He is neurovascular intact distally.    Neurological: He is alert and oriented to person, place, and time.  Skin: Skin is warm and dry.  Psychiatric: He has a normal mood and affect. His behavior is normal. Judgment and thought content normal.    Vital signs in last 24 hours:    Labs:   Estimated body mass index is 21.56 kg/m as calculated from the following:   Height as of 08/02/18: 6' (1.829 m).   Weight as of 08/02/18: 72.1 kg.   Imaging Review Plain radiographs demonstrate medial pole arthritis      Preoperative templating of the joint replacement has been completed, documented, and submitted to the Operating Room personnel in order to optimize intra-operative equipment management.     Assessment/Plan:  End stage arthritis, left hip(s)  The patient history, physical examination, clinical judgement of the provider and imaging studies are consistent with end stage degenerative joint disease of the left hip(s) and total hip arthroplasty is deemed medically necessary. The treatment options including medical management, injection therapy, arthroscopy and arthroplasty were discussed at length. The risks and benefits of total hip arthroplasty were presented and reviewed. The risks due to aseptic loosening,  infection, stiffness, dislocation/subluxation,  thromboembolic complications and other imponderables were discussed.  The patient acknowledged the explanation, agreed to proceed with the plan and consent was signed. Patient is being admitted for inpatient treatment for surgery, pain control, PT, OT, prophylactic antibiotics, VTE prophylaxis, progressive ambulation and ADL's and discharge planning.The patient is planning to be discharged home with home health services.

## 2018-08-04 ENCOUNTER — Inpatient Hospital Stay (HOSPITAL_COMMUNITY): Payer: Medicare Other | Admitting: Certified Registered Nurse Anesthetist

## 2018-08-04 ENCOUNTER — Inpatient Hospital Stay (HOSPITAL_COMMUNITY): Payer: Medicare Other

## 2018-08-04 ENCOUNTER — Inpatient Hospital Stay (HOSPITAL_COMMUNITY)
Admission: RE | Admit: 2018-08-04 | Discharge: 2018-08-05 | DRG: 470 | Disposition: A | Discharge status: Home / Self Care | Payer: Medicare Other | Source: Ambulatory Visit | Attending: Orthopedic Surgery | Admitting: Orthopedic Surgery

## 2018-08-04 ENCOUNTER — Encounter (HOSPITAL_COMMUNITY): Payer: Self-pay | Admitting: Certified Registered Nurse Anesthetist

## 2018-08-04 ENCOUNTER — Encounter (HOSPITAL_COMMUNITY)
Admission: RE | Disposition: A | Discharge status: Home / Self Care | Payer: Self-pay | Source: Ambulatory Visit | Attending: Orthopedic Surgery

## 2018-08-04 DIAGNOSIS — Z85828 Personal history of other malignant neoplasm of skin: Secondary | ICD-10-CM

## 2018-08-04 DIAGNOSIS — Z79899 Other long term (current) drug therapy: Secondary | ICD-10-CM | POA: Diagnosis not present

## 2018-08-04 DIAGNOSIS — H9190 Unspecified hearing loss, unspecified ear: Secondary | ICD-10-CM | POA: Diagnosis present

## 2018-08-04 DIAGNOSIS — Z87891 Personal history of nicotine dependence: Secondary | ICD-10-CM | POA: Diagnosis not present

## 2018-08-04 DIAGNOSIS — M1612 Unilateral primary osteoarthritis, left hip: Secondary | ICD-10-CM | POA: Diagnosis present

## 2018-08-04 DIAGNOSIS — Z419 Encounter for procedure for purposes other than remedying health state, unspecified: Secondary | ICD-10-CM

## 2018-08-04 DIAGNOSIS — I959 Hypotension, unspecified: Secondary | ICD-10-CM | POA: Diagnosis not present

## 2018-08-04 DIAGNOSIS — Z7901 Long term (current) use of anticoagulants: Secondary | ICD-10-CM

## 2018-08-04 DIAGNOSIS — I4891 Unspecified atrial fibrillation: Secondary | ICD-10-CM | POA: Diagnosis present

## 2018-08-04 HISTORY — PX: TOTAL HIP ARTHROPLASTY: SHX124

## 2018-08-04 LAB — TYPE AND SCREEN
ABO/RH(D): O POS
Antibody Screen: NEGATIVE

## 2018-08-04 SURGERY — ARTHROPLASTY, HIP, TOTAL, ANTERIOR APPROACH
Anesthesia: Monitor Anesthesia Care | Laterality: Left

## 2018-08-04 MED ORDER — BUPIVACAINE HCL (PF) 0.25 % IJ SOLN
INTRAMUSCULAR | Status: AC
  Filled 2018-08-04: qty 30

## 2018-08-04 MED ORDER — METOCLOPRAMIDE HCL 5 MG PO TABS: 5.0000 mg | ORAL_TABLET | Freq: Three times a day (TID) | ORAL | Status: DC | PRN

## 2018-08-04 MED ORDER — METHOCARBAMOL 500 MG PO TABS: 500.0000 mg | ORAL_TABLET | Freq: Four times a day (QID) | ORAL | Status: DC | PRN

## 2018-08-04 MED ORDER — FENTANYL CITRATE (PF) 100 MCG/2ML IJ SOLN
INTRAMUSCULAR | Status: AC
  Filled 2018-08-04: qty 2

## 2018-08-04 MED ORDER — ONDANSETRON HCL 4 MG/2ML IJ SOLN
INTRAMUSCULAR | Status: AC
  Filled 2018-08-04: qty 2

## 2018-08-04 MED ORDER — TRANEXAMIC ACID 1000 MG/10ML IV SOLN
1000.0000 mg | Freq: Once | INTRAVENOUS | Status: DC
  Filled 2018-08-04 (×2): qty 10

## 2018-08-04 MED ORDER — BISACODYL 5 MG PO TBEC: 5.0000 mg | DELAYED_RELEASE_TABLET | Freq: Every day | ORAL | Status: DC | PRN

## 2018-08-04 MED ORDER — ONE-DAILY MULTI VITAMINS PO TABS: 1.0000 | ORAL_TABLET | Freq: Every day | ORAL | Status: DC

## 2018-08-04 MED ORDER — BUPIVACAINE LIPOSOME 1.3 % IJ SUSP
INTRAMUSCULAR | Status: DC | PRN
  Administered 2018-08-04: 20 mL

## 2018-08-04 MED ORDER — PROPOFOL 10 MG/ML IV BOLUS
INTRAVENOUS | Status: AC
  Filled 2018-08-04: qty 40

## 2018-08-04 MED ORDER — FLEET ENEMA 7-19 GM/118ML RE ENEM: 1.0000 | ENEMA | Freq: Once | RECTAL | Status: DC | PRN

## 2018-08-04 MED ORDER — TRANEXAMIC ACID 1000 MG/10ML IV SOLN
INTRAVENOUS | Status: DC | PRN
  Administered 2018-08-04: 2000 mg via TOPICAL

## 2018-08-04 MED ORDER — POLYVINYL ALCOHOL 1.4 % OP SOLN
1.0000 [drp] | OPHTHALMIC | Status: DC | PRN
  Filled 2018-08-04: qty 15

## 2018-08-04 MED ORDER — GABAPENTIN 300 MG PO CAPS
ORAL_CAPSULE | ORAL | Status: AC
  Filled 2018-08-04: qty 1

## 2018-08-04 MED ORDER — SODIUM CHLORIDE 0.9% FLUSH
INTRAVENOUS | Status: DC | PRN
  Administered 2018-08-04: 30 mL

## 2018-08-04 MED ORDER — PROPOFOL 10 MG/ML IV BOLUS
INTRAVENOUS | Status: DC | PRN
  Administered 2018-08-04: 30 mg via INTRAVENOUS

## 2018-08-04 MED ORDER — DEXAMETHASONE SODIUM PHOSPHATE 10 MG/ML IJ SOLN
INTRAMUSCULAR | Status: AC
  Filled 2018-08-04: qty 1

## 2018-08-04 MED ORDER — ACETAMINOPHEN 500 MG PO TABS: 1000.0000 mg | ORAL_TABLET | Freq: Once | ORAL | Status: DC | PRN

## 2018-08-04 MED ORDER — FENTANYL CITRATE (PF) 100 MCG/2ML IJ SOLN: 25.0000 ug | INTRAMUSCULAR | Status: DC | PRN

## 2018-08-04 MED ORDER — BUPIVACAINE HCL (PF) 0.25 % IJ SOLN
INTRAMUSCULAR | Status: DC | PRN
  Administered 2018-08-04: 30 mL

## 2018-08-04 MED ORDER — CEFAZOLIN SODIUM-DEXTROSE 2-4 GM/100ML-% IV SOLN
2.0000 g | INTRAVENOUS | Status: AC
  Administered 2018-08-04: 2 g via INTRAVENOUS
  Filled 2018-08-04: qty 100

## 2018-08-04 MED ORDER — CHLORHEXIDINE GLUCONATE 4 % EX LIQD: 60.0000 mL | Freq: Once | CUTANEOUS | Status: DC

## 2018-08-04 MED ORDER — FENTANYL CITRATE (PF) 100 MCG/2ML IJ SOLN
INTRAMUSCULAR | Status: DC | PRN
  Administered 2018-08-04: 50 ug via INTRAVENOUS

## 2018-08-04 MED ORDER — PANTOPRAZOLE SODIUM 40 MG PO TBEC
40.0000 mg | DELAYED_RELEASE_TABLET | Freq: Every day | ORAL | Status: DC
  Administered 2018-08-05: 40 mg via ORAL
  Filled 2018-08-04: qty 1

## 2018-08-04 MED ORDER — ONDANSETRON HCL 4 MG/2ML IJ SOLN: 4.0000 mg | Freq: Four times a day (QID) | INTRAMUSCULAR | Status: DC | PRN

## 2018-08-04 MED ORDER — ACETAMINOPHEN 500 MG PO TABS
1000.0000 mg | ORAL_TABLET | Freq: Four times a day (QID) | ORAL | Status: AC
  Administered 2018-08-04 – 2018-08-05 (×3): 1000 mg via ORAL
  Filled 2018-08-04 (×2): qty 2

## 2018-08-04 MED ORDER — CELECOXIB 200 MG PO CAPS
200.0000 mg | ORAL_CAPSULE | Freq: Two times a day (BID) | ORAL | Status: DC
  Administered 2018-08-04 – 2018-08-05 (×2): 200 mg via ORAL
  Filled 2018-08-04 (×2): qty 1

## 2018-08-04 MED ORDER — PHENOL 1.4 % MT LIQD
1.0000 | OROMUCOSAL | Status: DC | PRN
  Filled 2018-08-04: qty 177

## 2018-08-04 MED ORDER — ADULT MULTIVITAMIN W/MINERALS CH
1.0000 | ORAL_TABLET | Freq: Every day | ORAL | Status: DC
  Administered 2018-08-05: 1 via ORAL
  Filled 2018-08-04: qty 1

## 2018-08-04 MED ORDER — PROPOFOL 500 MG/50ML IV EMUL
INTRAVENOUS | Status: DC | PRN
  Administered 2018-08-04: 65 ug/kg/min via INTRAVENOUS

## 2018-08-04 MED ORDER — ONDANSETRON HCL 4 MG/2ML IJ SOLN
INTRAMUSCULAR | Status: DC | PRN
  Administered 2018-08-04: 4 mg via INTRAVENOUS

## 2018-08-04 MED ORDER — DIPHENHYDRAMINE HCL 12.5 MG/5ML PO ELIX: 12.5000 mg | ORAL_SOLUTION | ORAL | Status: DC | PRN

## 2018-08-04 MED ORDER — ONDANSETRON HCL 4 MG PO TABS: 4.0000 mg | ORAL_TABLET | Freq: Four times a day (QID) | ORAL | Status: DC | PRN

## 2018-08-04 MED ORDER — APIXABAN 2.5 MG PO TABS
2.5000 mg | ORAL_TABLET | Freq: Two times a day (BID) | ORAL | Status: DC
  Administered 2018-08-05: 2.5 mg via ORAL
  Filled 2018-08-04: qty 1

## 2018-08-04 MED ORDER — OXYCODONE HCL 5 MG/5ML PO SOLN
5.0000 mg | Freq: Once | ORAL | Status: DC | PRN
  Filled 2018-08-04: qty 5

## 2018-08-04 MED ORDER — HYDROMORPHONE HCL 1 MG/ML IJ SOLN: 0.5000 mg | INTRAMUSCULAR | Status: DC | PRN

## 2018-08-04 MED ORDER — ACETAMINOPHEN 500 MG PO TABS
ORAL_TABLET | ORAL | Status: AC
  Filled 2018-08-04: qty 2

## 2018-08-04 MED ORDER — TRANEXAMIC ACID-NACL 1000-0.7 MG/100ML-% IV SOLN
1000.0000 mg | INTRAVENOUS | Status: AC
  Administered 2018-08-04: 1000 mg via INTRAVENOUS
  Filled 2018-08-04: qty 100

## 2018-08-04 MED ORDER — SODIUM CHLORIDE 0.9 % IJ SOLN
INTRAMUSCULAR | Status: AC
  Filled 2018-08-04: qty 50

## 2018-08-04 MED ORDER — DOCUSATE SODIUM 100 MG PO CAPS
100.0000 mg | ORAL_CAPSULE | Freq: Two times a day (BID) | ORAL | Status: DC
  Administered 2018-08-05: 100 mg via ORAL
  Filled 2018-08-04 (×2): qty 1

## 2018-08-04 MED ORDER — ACETAMINOPHEN 160 MG/5ML PO SOLN: 1000.0000 mg | Freq: Once | ORAL | Status: DC | PRN

## 2018-08-04 MED ORDER — LACTATED RINGERS IV SOLN
INTRAVENOUS | Status: DC
  Administered 2018-08-04: 14:00:00 via INTRAVENOUS
  Administered 2018-08-04: 1000 mL via INTRAVENOUS

## 2018-08-04 MED ORDER — LIDOCAINE 2% (20 MG/ML) 5 ML SYRINGE
INTRAMUSCULAR | Status: AC
  Filled 2018-08-04: qty 5

## 2018-08-04 MED ORDER — ACETAMINOPHEN 325 MG PO TABS
325.0000 mg | ORAL_TABLET | Freq: Four times a day (QID) | ORAL | Status: DC | PRN
  Administered 2018-08-05: 650 mg via ORAL
  Filled 2018-08-04: qty 2

## 2018-08-04 MED ORDER — DEXAMETHASONE SODIUM PHOSPHATE 10 MG/ML IJ SOLN
INTRAMUSCULAR | Status: DC | PRN
  Administered 2018-08-04: 10 mg via INTRAVENOUS

## 2018-08-04 MED ORDER — TRANEXAMIC ACID-NACL 1000-0.7 MG/100ML-% IV SOLN
1000.0000 mg | Freq: Once | INTRAVENOUS | Status: DC
  Filled 2018-08-04: qty 100

## 2018-08-04 MED ORDER — KCL IN DEXTROSE-NACL 20-5-0.45 MEQ/L-%-% IV SOLN
INTRAVENOUS | Status: DC
  Administered 2018-08-04 – 2018-08-05 (×2): via INTRAVENOUS
  Filled 2018-08-04 (×2): qty 1000

## 2018-08-04 MED ORDER — HYPROMELLOSE (GONIOSCOPIC) 2.5 % OP SOLN: 1.0000 [drp] | Freq: Every day | OPHTHALMIC | Status: DC | PRN

## 2018-08-04 MED ORDER — GABAPENTIN 300 MG PO CAPS
300.0000 mg | ORAL_CAPSULE | Freq: Three times a day (TID) | ORAL | Status: DC
  Administered 2018-08-04 – 2018-08-05 (×2): 300 mg via ORAL
  Filled 2018-08-04 (×2): qty 1

## 2018-08-04 MED ORDER — DEXAMETHASONE SODIUM PHOSPHATE 10 MG/ML IJ SOLN
10.0000 mg | Freq: Once | INTRAMUSCULAR | Status: AC
  Administered 2018-08-05: 10 mg via INTRAVENOUS
  Filled 2018-08-04: qty 1

## 2018-08-04 MED ORDER — KCL IN DEXTROSE-NACL 20-5-0.45 MEQ/L-%-% IV SOLN
INTRAVENOUS | Status: AC
  Filled 2018-08-04: qty 1000

## 2018-08-04 MED ORDER — METHOCARBAMOL 500 MG IVPB - SIMPLE MED
500.0000 mg | Freq: Four times a day (QID) | INTRAVENOUS | Status: DC | PRN
  Filled 2018-08-04: qty 50

## 2018-08-04 MED ORDER — ACETAMINOPHEN 325 MG PO TABS
ORAL_TABLET | ORAL | Status: AC
  Filled 2018-08-04: qty 2

## 2018-08-04 MED ORDER — POLYETHYLENE GLYCOL 3350 17 G PO PACK: 17.0000 g | PACK | Freq: Every day | ORAL | Status: DC | PRN

## 2018-08-04 MED ORDER — METOCLOPRAMIDE HCL 5 MG/ML IJ SOLN: 5.0000 mg | Freq: Three times a day (TID) | INTRAMUSCULAR | Status: DC | PRN

## 2018-08-04 MED ORDER — MENTHOL 3 MG MT LOZG: 1.0000 | LOZENGE | OROMUCOSAL | Status: DC | PRN

## 2018-08-04 MED ORDER — OXYCODONE HCL 5 MG PO TABS: 5.0000 mg | ORAL_TABLET | Freq: Once | ORAL | Status: DC | PRN

## 2018-08-04 MED ORDER — ALUMINUM HYDROXIDE GEL 320 MG/5ML PO SUSP
15.0000 mL | ORAL | Status: DC | PRN
  Filled 2018-08-04: qty 30

## 2018-08-04 MED ORDER — PROPOFOL 10 MG/ML IV BOLUS
INTRAVENOUS | Status: AC
  Filled 2018-08-04: qty 20

## 2018-08-04 MED ORDER — BUPIVACAINE IN DEXTROSE 0.75-8.25 % IT SOLN
INTRATHECAL | Status: DC | PRN
  Administered 2018-08-04: 1.4 mL via INTRATHECAL

## 2018-08-04 MED ORDER — OXYCODONE HCL 5 MG PO TABS: 5.0000 mg | ORAL_TABLET | ORAL | Status: DC | PRN

## 2018-08-04 MED ORDER — ACETAMINOPHEN 10 MG/ML IV SOLN: 1000.0000 mg | Freq: Once | INTRAVENOUS | Status: DC | PRN

## 2018-08-04 MED ORDER — 0.9 % SODIUM CHLORIDE (POUR BTL) OPTIME
TOPICAL | Status: DC | PRN
  Administered 2018-08-04: 1000 mL

## 2018-08-04 SURGICAL SUPPLY — 44 items
BAG DECANTER FOR FLEXI CONT (MISCELLANEOUS) ×3 IMPLANT
BLADE SAW SGTL 18X1.27X75 (BLADE) ×2 IMPLANT
BLADE SAW SGTL 18X1.27X75MM (BLADE) ×1
COVER PERINEAL POST (MISCELLANEOUS) ×3 IMPLANT
COVER SURGICAL LIGHT HANDLE (MISCELLANEOUS) ×3 IMPLANT
COVER WAND RF STERILE (DRAPES) IMPLANT
CUP PINNACLE 100 SERIES 58MM (Hips) ×3 IMPLANT
DECANTER SPIKE VIAL GLASS SM (MISCELLANEOUS) ×6 IMPLANT
DRAPE STERI IOBAN 125X83 (DRAPES) ×3 IMPLANT
DRAPE U-SHAPE 47X51 STRL (DRAPES) ×6 IMPLANT
DRSG AQUACEL AG ADV 3.5X10 (GAUZE/BANDAGES/DRESSINGS) ×3 IMPLANT
DURAPREP 26ML APPLICATOR (WOUND CARE) ×3 IMPLANT
ELECT REM PT RETURN 15FT ADLT (MISCELLANEOUS) ×3 IMPLANT
ELIMINATOR HOLE APEX DEPUY (Hips) ×3 IMPLANT
GLOVE BIO SURGEON STRL SZ7.5 (GLOVE) ×3 IMPLANT
GLOVE BIO SURGEON STRL SZ8.5 (GLOVE) ×3 IMPLANT
GLOVE BIOGEL PI IND STRL 8 (GLOVE) ×1 IMPLANT
GLOVE BIOGEL PI IND STRL 9 (GLOVE) ×1 IMPLANT
GLOVE BIOGEL PI INDICATOR 8 (GLOVE) ×2
GLOVE BIOGEL PI INDICATOR 9 (GLOVE) ×2
GOWN STRL REUS W/ TWL LRG LVL3 (GOWN DISPOSABLE) ×1 IMPLANT
GOWN STRL REUS W/ TWL XL LVL3 (GOWN DISPOSABLE) ×1 IMPLANT
GOWN STRL REUS W/TWL LRG LVL3 (GOWN DISPOSABLE) ×2
GOWN STRL REUS W/TWL XL LVL3 (GOWN DISPOSABLE) ×2
HEAD M SROM 36MM PLUS 1.5 (Hips) ×1 IMPLANT
LINER NEUTRAL 36X58 PLUS4 ×3 IMPLANT
MANIFOLD NEPTUNE II (INSTRUMENTS) ×3 IMPLANT
NEEDLE HYPO 21X1.5 SAFETY (NEEDLE) ×6 IMPLANT
PACK ANTERIOR HIP CUSTOM (KITS) ×3 IMPLANT
SROM M HEAD 36MM PLUS 1.5 (Hips) ×3 IMPLANT
STEM TRI LOC BPS SZ8 W GRIPTON ×1 IMPLANT
SUT ETHIBOND NAB CT1 #1 30IN (SUTURE) ×3 IMPLANT
SUT VIC AB 0 CT1 27 (SUTURE) ×2
SUT VIC AB 0 CT1 27XBRD ANBCTR (SUTURE) ×1 IMPLANT
SUT VIC AB 1 CTX 36 (SUTURE) ×2
SUT VIC AB 1 CTX36XBRD ANBCTR (SUTURE) ×1 IMPLANT
SUT VIC AB 2-0 CT1 27 (SUTURE) ×2
SUT VIC AB 2-0 CT1 TAPERPNT 27 (SUTURE) ×1 IMPLANT
SUT VIC AB 3-0 CT1 27 (SUTURE) ×2
SUT VIC AB 3-0 CT1 TAPERPNT 27 (SUTURE) ×1 IMPLANT
SYR CONTROL 10ML LL (SYRINGE) ×9 IMPLANT
TRAY FOLEY MTR SLVR 16FR STAT (SET/KITS/TRAYS/PACK) ×3 IMPLANT
TRI LOC BPS SZ8 W GRIPTON ×3 IMPLANT
YANKAUER SUCT BULB TIP 10FT TU (MISCELLANEOUS) ×3 IMPLANT

## 2018-08-04 NOTE — Op Note (Signed)
OPERATIVE REPORT    DATE OF PROCEDURE:  08/04/2018       PREOPERATIVE DIAGNOSIS:  LEFT HIP OSTEOARTHRITIS                                                          POSTOPERATIVE DIAGNOSIS:  LEFT HIP OSTEOARTHRITIS                                                           PROCEDURE: Anterior L total hip arthroplasty using a 58 mm DePuy Pinnacle  Cup, [employer], 0-degree polyethylene liner, a +1x36 mm ceramic head, a 8 hi Depuy Triloc stem   SURGEON: Kerin Salen    ASSISTANT:   Kerry Hough. [employer]  (present throughout entire procedure and necessary for timely completion of the procedure)   ANESTHESIA: Spinal BLOOD LOSS: 250cc FLUID REPLACEMENT: 1500 crystalloid Antibiotic: 2gm ancef Tranexamic Acid: 1gm IV, 2gm Topical Exparel: 266mg  COMPLICATIONS: none    INDICATIONS FOR PROCEDURE: A 84 y.o. year-old With  LEFT HIP OSTEOARTHRITIS   for 2 years, x-rays show bone-on-bone arthritic changes, and osteophytes. Despite conservative measures with observation, anti-inflammatory medicine, narcotics, use of a cane, has severe unremitting pain and can ambulate only a few blocks before resting. Patient desires elective L total hip arthroplasty to decrease pain and increase function. The risks, benefits, and alternatives were discussed at length including but not limited to the risks of infection, bleeding, nerve injury, stiffness, blood clots, the need for revision surgery, cardiopulmonary complications, among others, and they were willing to proceed. Questions answered      PROCEDURE IN DETAIL: The patient was identified by armband,   received preoperative IV antibiotics in the holding area at [facility], taken to the operating room , appropriate anesthetic monitors   were attached and  anesthesia was induced with the patienton the gurney. The HANA boots were applied to the feet and he was then transferred to the HANA table with a peroneal post and support underneath the  non-operative le, which was locked in 2 lb traction. Theoperative lower extremity was then prepped and draped in the usual sterile fashion from just above the iliac crest to the knee. And a timeout procedure was performed. We then made a 12 cm incision along the interval at the leading edge of the tensor fascia lata of starting at 2 cm lateral to and 2 cm distal to the ASIS. Small bleeders in the skin and subcutaneous tissue identified and cauterized we dissected down to the fascia and made an incision in the fascia allowing [place] to elevate the fascia of the tensor muscle and exploited the interval between the rectus and the tensor fascia lata. A Cobra retractor was then placed along the superior neck of the femur and a Cobra retractor along the inferior neck of the femur we teed the capsule starting out at the superior anterior aspect of the acetabulum going distally and made the T along the neck both leaflets of the T were tagged with #2 Ethibond suture. Cobra retractors were then placed along the inferior and superior neck allowing [place] to  perform a standard neck cut and removed the femoral head with a power corkscrew. We then placed a right angle Hohmann retractor along the anterior aspect of the acetabulum a long bent Homan in the cotyloid notch and posteriorly a Cobra retractor. We then sequentially reamed up to a 57 mm basket reamer obtaining good coverage in all quadrants, verified by C-arm imaging. Under C-arm control with and hammered into place a 58 mm Pinnacle cup in 45 of abduction and 15 of anteversion. The cup seated nicely and required no supplemental screws. We then placed a central hole Eliminator and a 0 polyethylene liner. The foot was then externally rotated to 130, the limb was extended and adducted delivering the proximal femur up into the wound. A medium Hohmann retractor was placed over the greater trochanter and a long Homan retractor along the posterior femoral neck completing the  exposure. We then performed releases superiorly and and inferiorly of the capsule going back to the pirformis fossa superiorly and to the lesser trochanter inferiorly. We then entered the proximal femur with the box cutting offset chisel followed by, a canal sounder, the chili pepper and broaching up to a 8 broach. This seated nicely and we reamed the calcar. A trial reduction was performed with a 1 mm 36 mm head.The limb lengths were excellent the hip was stable in 90 of external rotation. At this point the trial components removed and we hammered into place a # 8 hi  Offset Tri-Lock stem with Gryption coating. A + 1x36 mm ceramic ball was then hammered into place the hip was reduced and final C-arm images obtained. The wound was thoroughly irrigated with normal saline solution. We repaired the ant capsule and the tensor fascia lot a with running 0 vicryl suture. the subcutaneous tissue was closed with 2-0 and 3-0 Vicryl suture followed by an Aquacil dressing. At this point the patient was awaken and transferred to hospital gurney without difficulty. The subcutaneous tissue with 0 and 2-0 undyed Vicryl suture and the skin with running   3-0 vicryl subcuticular suture. Aquacil dressing was applied. The patient was then unclamped, rolled supine, awaken extubated and taken to recovery room without difficulty in stable condition.   Kerin Salen 08/04/2018, 3:01 PM

## 2018-08-04 NOTE — Anesthesia Procedure Notes (Signed)
Spinal  Patient location during procedure: OR Start time: 08/04/2018 1:19 PM End time: 08/04/2018 1:22 PM Staffing Anesthesiologist: Audry Pili, MD Performed: anesthesiologist  Preanesthetic Checklist Completed: patient identified, surgical consent, pre-op evaluation, timeout performed, IV checked, risks and benefits discussed and monitors and equipment checked Spinal Block Patient position: sitting Prep: DuraPrep Patient monitoring: heart rate, cardiac monitor, continuous pulse ox and blood pressure Approach: midline Location: L3-4 Injection technique: single-shot Needle Needle type: Pencan  Needle gauge: 24 G Additional Notes Consent was obtained prior to the procedure with all questions answered and concerns addressed. Risks including, but not limited to, bleeding, infection, nerve damage, paralysis, failed block, inadequate analgesia, allergic reaction, high spinal, itching, and headache were discussed and the patient wished to proceed. Functioning IV was confirmed and monitors were applied. Sterile prep and drape, including hand hygiene, mask, and sterile gloves were used. The patient was positioned and the spine was prepped. The skin was anesthetized with lidocaine. Free flow of clear CSF was obtained prior to injecting local anesthetic into the CSF. The spinal needle aspirated freely following injection. The needle was carefully withdrawn. The patient tolerated the procedure well.   Renold Don, MD

## 2018-08-04 NOTE — Interval H&P Note (Signed)
History and Physical Interval Note:  08/04/2018 12:54 PM  Jason Warren  has presented today for surgery, with the diagnosis of LEFT HIP OSTEOARTHRITIS  The various methods of treatment have been discussed with the patient and family. After consideration of risks, benefits and other options for treatment, the patient has consented to  Procedure(s): TOTAL HIP ARTHROPLASTY ANTERIOR APPROACH (Left) as a surgical intervention .  The patient's history has been reviewed, patient examined, no change in status, stable for surgery.  I have reviewed the patient's chart and labs.  Questions were answered to the patient's satisfaction.     Kerin Salen

## 2018-08-04 NOTE — Anesthesia Preprocedure Evaluation (Addendum)
Anesthesia Evaluation  Patient identified by MRN, date of birth, ID band Patient awake    Reviewed: Allergy & Precautions, NPO status , Patient's Chart, lab work & pertinent test results  History of Anesthesia Complications Negative for: history of anesthetic complications  Airway Mallampati: I  TM Distance: >3 FB Neck ROM: Full    Dental  (+) Teeth Intact   Pulmonary neg shortness of breath, neg sleep apnea, neg COPD, neg recent URI, former smoker,    breath sounds clear to auscultation       Cardiovascular (-) hypertension(-) angina(-) Past MI and (-) CHF + dysrhythmias Atrial Fibrillation  Rhythm:Regular     Neuro/Psych negative neurological ROS  negative psych ROS   GI/Hepatic negative GI ROS, Neg liver ROS,   Endo/Other  negative endocrine ROS  Renal/GU negative Renal ROS     Musculoskeletal  (+) Arthritis ,   Abdominal   Peds  Hematology Eliquis. Last dose 10/28   Anesthesia Other Findings 10/19 tte: Left ventricle: The cavity size was normal. Systolic function was   normal. The estimated ejection fraction was in the range of 55%   to 60%. - Mitral valve: There was mild regurgitation. - Left atrium: The atrium was mildly dilated. - Right atrium: The atrium was mildly dilated. - Pulmonary arteries: PA peak pressure: 35 mm Hg (S).  Reproductive/Obstetrics                            Anesthesia Physical Anesthesia Plan  ASA: II  Anesthesia Plan: MAC and Spinal   Post-op Pain Management:    Induction:   PONV Risk Score and Plan: 1 and Treatment may vary due to age or medical condition  Airway Management Planned: Nasal Cannula  Additional Equipment: None  Intra-op Plan:   Post-operative Plan:   Informed Consent: I have reviewed the patients History and Physical, chart, labs and discussed the procedure including the risks, benefits and alternatives for the proposed  anesthesia with the patient or authorized representative who has indicated his/her understanding and acceptance.   Dental advisory given  Plan Discussed with: CRNA and Surgeon  Anesthesia Plan Comments:         Anesthesia Quick Evaluation

## 2018-08-04 NOTE — Anesthesia Postprocedure Evaluation (Signed)
Anesthesia Post Note  Patient: Jason Warren  Procedure(s) Performed: TOTAL HIP ARTHROPLASTY ANTERIOR APPROACH (Left )     Patient location during evaluation: PACU Anesthesia Type: Spinal Level of consciousness: awake and alert Pain management: pain level controlled Vital Signs Assessment: post-procedure vital signs reviewed and stable Respiratory status: spontaneous breathing and respiratory function stable Cardiovascular status: blood pressure returned to baseline and stable Postop Assessment: spinal receding and no apparent nausea or vomiting Anesthetic complications: no    Last Vitals:  Vitals:   08/04/18 1530 08/04/18 1545  BP: (!) 121/58 118/79  Pulse: 77 79  Resp: 15 14  Temp:  36.4 C  SpO2: 100% 100%    Last Pain:  Vitals:   08/04/18 1545  TempSrc:   PainSc: 0-No pain                 Audry Pili

## 2018-08-04 NOTE — Transfer of Care (Signed)
Immediate Anesthesia Transfer of Care Note  Patient: Jason Warren  Procedure(s) Performed: Procedure(s): TOTAL HIP ARTHROPLASTY ANTERIOR APPROACH (Left)  Patient Location: PACU  Anesthesia Type:Spinal  Level of Consciousness:  sedated, patient cooperative and responds to stimulation  Airway & Oxygen Therapy:Patient Spontanous Breathing and Patient connected to face mask oxgen  Post-op Assessment:  Report given to PACU RN and Post -op Vital signs reviewed and stable  Post vital signs:  Reviewed and stable  Last Vitals:  Vitals:   08/04/18 1045  BP: (!) 150/80  Pulse: 77  Resp: 20  Temp: (!) 36.3 C  SpO2: 017%    Complications: No apparent anesthesia complications

## 2018-08-04 NOTE — Care Plan (Signed)
Ortho Bundle Case Management Note  Patient Details  Name: Jason Warren MRN: [id number] Date of Birth: 1928-12-16   Spoke with patient prior to surgery. He will discharge to home with wife and prefers to go straight to OPPT. Set up in [place] as he requested.                DME was orginally ordered from [facility]. Please contact [facility] for Saturday delivery.   DME Arranged:  [facility]ford Rile rolling DME Agency:  [facility] Arranged:  PT [place] Agency:  [facility]  Additional Comments: Please contact me with any questions of if this plan should need to change.  Ladell Heads,  [place] [facility]  [phone] 08/04/2018, 5:38 PM

## 2018-08-05 ENCOUNTER — Other Ambulatory Visit: Payer: Self-pay

## 2018-08-05 LAB — BASIC METABOLIC PANEL
Anion gap: 6 (ref 5–15)
BUN: 15 mg/dL (ref 8–23)
CALCIUM: 8.1 mg/dL — AB (ref 8.9–10.3)
CO2: 26 mmol/L (ref 22–32)
CREATININE: 1.08 mg/dL (ref 0.61–1.24)
Chloride: 106 mmol/L (ref 98–111)
GFR calc Af Amer: >60 mL/min (ref >60)
GFR calc non Af Amer: 60 mL/min — ABNORMAL LOW (ref >60)
Glucose, Bld: 183 mg/dL — ABNORMAL HIGH (ref 70–99)
Potassium: 5 mmol/L (ref 3.5–5.1)
SODIUM: 138 mmol/L (ref 135–145)

## 2018-08-05 LAB — CBC
HCT: 36.1 % — ABNORMAL LOW (ref 39.0–52.0)
Hemoglobin: 11.6 g/dL — ABNORMAL LOW (ref 13.0–17.0)
MCH: 32.1 pg (ref 26.0–34.0)
MCHC: 32.1 g/dL (ref 30.0–36.0)
MCV: 100 fL (ref 80.0–100.0)
PLATELETS: 235 10*3/uL (ref 150–400)
RBC: 3.61 MIL/uL — ABNORMAL LOW (ref 4.22–5.81)
RDW: 12.9 % (ref 11.5–15.5)
WBC: 10.3 10*3/uL (ref 4.0–10.5)
nRBC: 0 % (ref 0.0–0.2)

## 2018-08-05 MED ORDER — CELECOXIB 200 MG PO CAPS: 200.0000 mg | ORAL_CAPSULE | Freq: Two times a day (BID) | ORAL | 0 refills | Status: DC

## 2018-08-05 MED ORDER — SODIUM CHLORIDE 0.9 % IV BOLUS
500.0000 mL | Freq: Once | INTRAVENOUS | Status: AC
  Administered 2018-08-05: 500 mL via INTRAVENOUS

## 2018-08-05 MED ORDER — OXYCODONE HCL 5 MG PO TABS: 5.0000 mg | ORAL_TABLET | Freq: Four times a day (QID) | ORAL | 0 refills | Status: DC | PRN

## 2018-08-05 NOTE — Discharge Instructions (Signed)
Discharge Instructions after total hip arthroplasty  You can bear weight and ambulate as tolerated on both legs Observe posterior hip precautions Use ice on the hip intermittently over the first 48 hours after surgery.  Pain medicine has been prescribed for you.  Use your medicine liberally over the first 48 hours, and then you can begin to taper your use. You may take Extra Strength Tylenol or Tylenol only in place of the pain pills. DO NOT take ANY nonsteroidal anti-inflammatory pain medications: Advil, Motrin, Ibuprofen, Aleve, Naproxen or Naprosyn.  The dressing is waterproof, okay to shower with it on. Leave it on until your first post op visit with Dr. Mayer Camel     Please call [phone] during normal business hours or [phone] after hours for any problems. Including the following:  - excessive redness of the incisions - drainage for more than 4 days - fever of more than 101.5 F  *Please note that pain medications will not be refilled after hours or on weekends.

## 2018-08-05 NOTE — Progress Notes (Signed)
Physical Therapy Treatment Patient Details Name: Jason Warren MRN: [id number] DOB: Dec 18, 1928 Today's Date: 08/05/2018    History of Present Illness Pt s/p L THR and with hx of a-fib    PT Comments    Pt progressing well with mobility and eager for dc home.  Pt and family reviewed car transfers, stairs and home therex program with written instruction provided.   Follow Up Recommendations  Follow surgeon's recommendation for DC plan and follow-up therapies     Equipment Recommendations  Rolling walker with 5" wheels    Recommendations for Other Services       Precautions / Restrictions Precautions Precautions: Fall Restrictions Weight Bearing Restrictions: No    Mobility  Bed Mobility               General bed mobility comments: Pt OOB with nursing - states unassisted  Transfers Overall transfer level: Needs assistance Equipment used: Rolling walker (2 wheeled) Transfers: Sit to/from Stand Sit to Stand: Min guard;Supervision         General transfer comment: cues for LE management and use of UEs to self assist  Ambulation/Gait Ambulation/Gait assistance: Min guard;Supervision Gait Distance (Feet): 400 Feet Assistive device: Rolling walker (2 wheeled) Gait Pattern/deviations: Step-to pattern;Step-through pattern;Decreased step length - right;Decreased step length - left;Shuffle;Trunk flexed     General Gait Details: cues for sequence, posture and position from RW.  Initial mild instability - improved with distance ambulated   Stairs Stairs: Yes Stairs assistance: Min assist Stair Management: No rails;Backwards;Forwards;With walker;Step to pattern Number of Stairs: 6 General stair comments: single step twice fwd with RW; 2 steps fwd and bkwd with RW; cues for sequence and foot/RW placement   Wheelchair Mobility    Modified Rankin (Stroke Patients Only)       Balance Overall balance assessment: Mild deficits observed, not formally tested                                          Cognition Arousal/Alertness: Awake/alert Behavior During Therapy: WFL for tasks assessed/performed Overall Cognitive Status: Within Functional Limits for tasks assessed                                        Exercises Total Joint Exercises Ankle Circles/Pumps: AROM;Both;20 reps;Supine Quad Sets: AROM;Both;10 reps;Supine Heel Slides: AAROM;Left;20 reps;Supine Hip ABduction/ADduction: AAROM;Left;15 reps;Supine Long Arc Quad: AROM;Left;10 reps;Seated    General Comments        Pertinent Vitals/Pain Pain Assessment: 0-10 Pain Score: 1  Pain Location: L hip Pain Descriptors / Indicators: Sore Pain Intervention(s): Monitored during session;Limited activity within patient's tolerance;Premedicated before session;Ice applied    Home Living Family/patient expects to be discharged to:: Private residence Living Arrangements: Spouse/significant other Available Help at Discharge: Family Type of Home: House Home Access: Stairs to enter Entrance Stairs-Rails: None Home Layout: One level Home Equipment: Cane - single point;Bedside commode      Prior Function Level of Independence: Independent          PT Goals (current goals can now be found in the care plan section) Acute Rehab PT Goals Patient Stated Goal: Regain IND PT Goal Formulation: With patient Time For Goal Achievement: 08/12/18 Potential to Achieve Goals: Good Progress towards PT goals: Progressing toward goals    Frequency  7X/week      PT Plan Current plan remains appropriate    Co-evaluation              AM-PAC PT "6 Clicks" Daily Activity  Outcome Measure  Difficulty turning over in bed (including adjusting bedclothes, sheets and blankets)?: A Lot Difficulty moving from lying on back to sitting on the side of the bed? : A Lot Difficulty sitting down on and standing up from a chair with arms (e.g., wheelchair, bedside  commode, etc,.)?: A Lot Help needed moving to and from a bed to chair (including a wheelchair)?: A Little Help needed walking in hospital room?: A Little Help needed climbing 3-5 steps with a railing? : A Little 6 Click Score: 15    End of Session Equipment Utilized During Treatment: Gait belt Activity Tolerance: Patient tolerated treatment well Patient left: in chair;with call bell/phone within reach;with family/visitor present Nurse Communication: Mobility status PT Visit Diagnosis: Difficulty in walking, not elsewhere classified (R26.2)     Time: 9872-1587 PT Time Calculation (min) (ACUTE ONLY): 25 min  Charges:  $Gait Training: 8-22 mins $Therapeutic Exercise: 8-22 mins $Therapeutic Activity: 8-22 mins                     [place] Pager [phone] Office [phone]    Jason Warren 08/05/2018, 12:30 PM

## 2018-08-05 NOTE — Progress Notes (Signed)
Patient with labile blood pressure, asymptomatic, alert and oriented with adequate urine output, Danielle, PA notified, new orders obtained, RN to continue to monitor status, [place] Kristopher Attwood RN BSN 08/05/2018 0000

## 2018-08-05 NOTE — Discharge Summary (Signed)
Patient ID: Jason Warren MRN: [id number] DOB/AGE: 1929/04/10 84 y.o.  Admit date: 08/04/2018 Discharge date: 08/05/2018  Admission Diagnoses:  Principal Problem:   Osteoarthritis of left hip Active Problems:   Primary osteoarthritis of left hip   Discharge Diagnoses:  Same  Past Medical History:  Diagnosis Date  . Arthritis   . 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 Dysrhythmia    A-fib  . Hip pain    left  . HOH (hard of hearing)     Surgeries: Procedure(s): TOTAL HIP ARTHROPLASTY ANTERIOR APPROACH on 08/04/2018   Consultants:   Discharged Condition: Improved  Hospital Course: Jason Warren is an 84 y.o. male who was admitted 08/04/2018 for operative treatment ofOsteoarthritis of left hip. Patient has severe unremitting pain that affects sleep, daily activities, and work/hobbies. After pre-op clearance the patient was taken to the operating room on 08/04/2018 and underwent  Procedure(s): TOTAL HIP ARTHROPLASTY ANTERIOR APPROACH.    Patient was given perioperative antibiotics:  Anti-infectives (From admission, onward)   Start     Dose/Rate Route Frequency Ordered Stop   08/04/18 1115  ceFAZolin (ANCEF) IVPB 2g/100 mL premix     2 g 200 mL/hr over 30 Minutes Intravenous On call to O.R. 08/04/18 1101 08/04/18 1322       Patient was given sequential compression devices, early ambulation, and chemoprophylaxis to prevent DVT.  Patient benefited maximally from hospital stay and there were no complications.    Recent vital signs:  Patient Vitals for the past 24 hrs:  BP Temp Temp src Pulse Resp SpO2 Height Weight  08/05/18 0645 100/63 - - (!) 57 18 98 % - -  08/05/18 0515 (!) 90/57 (!) 97.5 F (36.4 C) Oral 66 16 94 % - -  08/05/18 0100 97/63 - - 72 18 97 % - -  08/04/18 2335 (!) 93/59 - - 70 17 98 % - -  08/04/18 2226 98/65 - - 69 16 - - -  08/04/18 2210 (!) 90/58 (!) 97.5 F (36.4 C) Oral 62 16 98 % - -  08/04/18 2101 106/60 97.8 F  (36.6 C) Oral 69 16 95 % - -  08/04/18 1957 (!) 110/58 98.2 F (36.8 C) Oral 78 19 98 % - -  08/04/18 1900 - 98.1 F (36.7 C) - - - - - -  08/04/18 1822 122/67 - - (!) 103 - 97 % - -  08/04/18 1800 122/67 - - - 20 - - -  08/04/18 1712 134/82 97.7 F (36.5 C) Axillary 78 20 100 % - -  08/04/18 1645 126/74 98.1 F (36.7 C) - 80 16 100 % - -  08/04/18 1630 128/76 - - 69 16 100 % - -  08/04/18 1615 125/76 - - 76 20 100 % - -  08/04/18 1600 105/73 - - 75 17 95 % - -  08/04/18 1545 118/79 97.6 F (36.4 C) - 79 14 100 % - -  08/04/18 1530 (!) 121/58 - - 77 15 100 % - -  08/04/18 1515 99/68 - - 77 19 100 % - -  08/04/18 1513 100/63 97.9 F (36.6 C) - 78 16 100 % - -  08/04/18 1125 - - - - - - 6' (1.829 m) 72.6 kg  08/04/18 1045 (!) 150/80 (!) 97.4 F (36.3 C) Oral 77 20 100 % - -     Recent laboratory studies:  Recent Labs  08/02/18 1458 08/05/18 0318  WBC 5.3 10.3  HGB 14.5 11.6*  HCT 45.5 36.1*  PLT 253 235  NA 141 138  K 4.9 5.0  CL 104 106  CO2 28 26  BUN 18 15  CREATININE 1.05 1.08  GLUCOSE 90 183*  INR 1.00  --   CALCIUM 9.3 8.1*     Discharge Medications:   Allergies as of 08/05/2018   No Known Allergies     Medication List    STOP taking these medications   acetaminophen 500 MG tablet Commonly known as:  TYLENOL     TAKE these medications   ELIQUIS 5 MG Tabs tablet Generic drug:  apixaban Take 5 mg by mouth 2 (two) times daily.   hydroxypropyl methylcellulose / hypromellose 2.5 % ophthalmic solution Commonly known as:  ISOPTO TEARS / GONIOVISC Place 1 drop into the left eye daily as needed for dry eyes.   multivitamin tablet Take 1 tablet by mouth daily.   pravastatin 40 MG tablet Commonly known as:  PRAVACHOL Take 40 mg by mouth at bedtime.   saw palmetto 160 MG capsule Take 160 mg by mouth 2 (two) times daily.            Durable Medical Equipment  (From admission, onward)         Start     Ordered   08/04/18 2004  DME Walker  rolling  Once    Question:  Patient needs a walker to treat with the following condition  Answer:  Status post total hip replacement, left   08/04/18 2003   08/04/18 2004  DME 3 n 1  Once     08/04/18 2003          Diagnostic Studies: Dg C-arm 1-60 Min-no Report  Result Date: 08/04/2018 Fluoroscopy was utilized by the requesting physician.  No radiographic interpretation.   Dg Hip Operative Unilat W Or W/o Pelvis Left  Result Date: 08/04/2018 CLINICAL DATA:  Left hip replacement. EXAM: OPERATIVE LEFT HIP (WITH PELVIS IF PERFORMED) 2 VIEWS TECHNIQUE: Fluoroscopic spot image(s) were submitted for interpretation post-operatively. COMPARISON:  MRI 10/05/2017. FINDINGS: Total left hip replacement.  Hardware intact.  Anatomic alignment. IMPRESSION: Left hip replacement with anatomic alignment. Electronically Signed   By: Marcello Moores  Register   On: 08/04/2018 14:52    Disposition: Discharge disposition: 01-Home or Self Care       Discharge Instructions    Call MD / Call 911   Complete by:  As directed    If you experience chest pain or shortness of breath, CALL 911 and be transported to the hospital emergency room.  If you develope a fever above 101 F, pus (white drainage) or increased drainage or redness at the wound, or calf pain, call your surgeon's office.   Constipation Prevention   Complete by:  As directed    Drink plenty of fluids.  Prune juice may be helpful.  You may use a stool softener, such as Colace (over the counter) 100 mg twice a day.  Use MiraLax (over the counter) for constipation as needed.   Diet - low sodium heart healthy   Complete by:  As directed    Increase activity slowly as tolerated   Complete by:  As directed       Follow-up Information    Frederik Pear, MD. Go on 08/17/2018.   Specialty:  Orthopedic Surgery Why:  Your appointment has been set for [place] information: [place] [id number] [phone]  [facility]. Go on 08/08/2018.   Why:  your appointment has been made for [place] information: [address] [place], [place] [address]  [phone]           Signed: Grier Mitts 08/05/2018, 9:01 AM

## 2018-08-05 NOTE — Evaluation (Signed)
Physical Therapy Evaluation Patient Details Name: Jason Warren MRN: [id number] DOB: 10-Mar-1929 Today's Date: 08/05/2018   History of Present Illness  Pt s/p L THR and with hx of a-fib  Clinical Impression  Pt s/p L THR and presents with decreased L LE strength/ROM and post op pain limiting functional mobility.  Pt should progress to dc home with family assist and start OPPT  On Tuesday  08/08/18.    Follow Up Recommendations Follow surgeon's recommendation for DC plan and follow-up therapies    Equipment Recommendations  Rolling walker with 5" wheels    Recommendations for Other Services       Precautions / Restrictions Precautions Precautions: Fall Restrictions Weight Bearing Restrictions: No      Mobility  Bed Mobility               General bed mobility comments: Pt OOB with nursing - states unassisted  Transfers Overall transfer level: Needs assistance Equipment used: Rolling walker (2 wheeled) Transfers: Sit to/from Stand Sit to Stand: Min assist;Min guard         General transfer comment: cues for LE management and use of UEs to self assist  Ambulation/Gait Ambulation/Gait assistance: Min assist;Min guard Gait Distance (Feet): 200 Feet Assistive device: Rolling walker (2 wheeled) Gait Pattern/deviations: Step-to pattern;Step-through pattern;Decreased step length - right;Decreased step length - left;Shuffle;Trunk flexed     General Gait Details: cues for sequence, posture and position from RW.  Initial mild instability - improved with distance ambulated  Stairs            Wheelchair Mobility    Modified Rankin (Stroke Patients Only)       Balance Overall balance assessment: Mild deficits observed, not formally tested                                           Pertinent Vitals/Pain Pain Assessment: 0-10 Pain Score: 1  Pain Location: L hip Pain Descriptors / Indicators: Sore Pain Intervention(s): Limited activity  within patient's tolerance;Monitored during session;Ice applied    Home Living Family/patient expects to be discharged to:: Private residence Living Arrangements: Spouse/significant other Available Help at Discharge: Family Type of Home: House Home Access: Stairs to enter Entrance Stairs-Rails: None Entrance Stairs-Number of Steps: 1 + 1 Home Layout: One level Home Equipment: Cane - single point;Bedside commode      Prior Function Level of Independence: Independent               Hand Dominance        Extremity/Trunk Assessment   Upper Extremity Assessment Upper Extremity Assessment: Overall WFL for tasks assessed    Lower Extremity Assessment Lower Extremity Assessment: LLE deficits/detail LLE Deficits / Details: strength at hip 2+/5 with AAROM at hip to 85 flex and 15 abd       Communication   Communication: HOH  Cognition Arousal/Alertness: Awake/alert Behavior During Therapy: WFL for tasks assessed/performed Overall Cognitive Status: Within Functional Limits for tasks assessed                                        General Comments      Exercises Total Joint Exercises Ankle Circles/Pumps: AROM;Both;20 reps;Supine Quad Sets: AROM;Both;10 reps;Supine Heel Slides: AAROM;Left;20 reps;Supine Hip ABduction/ADduction: AAROM;Left;15 reps;Supine Long Arc Quad: AROM;Left;10 reps;Seated  Assessment/Plan    PT Assessment Patient needs continued PT services  PT Problem List Decreased strength;Decreased range of motion;Decreased activity tolerance;Decreased balance;Decreased mobility;Decreased knowledge of use of DME;Pain       PT Treatment Interventions DME instruction;Gait training;Stair training;Functional mobility training;Therapeutic activities;Therapeutic exercise;Patient/family education    PT Goals (Current goals can be found in the Care Plan section)  Acute Rehab PT Goals Patient Stated Goal: Regain IND PT Goal Formulation: With  patient Time For Goal Achievement: 08/12/18 Potential to Achieve Goals: Good    Frequency 7X/week   Barriers to discharge        Co-evaluation               AM-PAC PT "6 Clicks" Daily Activity  Outcome Measure Difficulty turning over in bed (including adjusting bedclothes, sheets and blankets)?: A Lot Difficulty moving from lying on back to sitting on the side of the bed? : A Lot Difficulty sitting down on and standing up from a chair with arms (e.g., wheelchair, bedside commode, etc,.)?: Unable Help needed moving to and from a bed to chair (including a wheelchair)?: A Little Help needed walking in hospital room?: A Little Help needed climbing 3-5 steps with a railing? : A Little 6 Click Score: 14    End of Session Equipment Utilized During Treatment: Gait belt Activity Tolerance: Patient tolerated treatment well Patient left: in chair;with call bell/phone within reach;with family/visitor present Nurse Communication: Mobility status PT Visit Diagnosis: Difficulty in walking, not elsewhere classified (R26.2)    Time: 0312-8118 PT Time Calculation (min) (ACUTE ONLY): 37 min   Charges:   PT Evaluation $PT Eval Low Complexity: 1 Low PT Treatments $Therapeutic Exercise: 8-22 mins        Debe Coder PT Acute Rehabilitation Services Pager [phone] Office [phone]   Mazi Brailsford 08/05/2018, 12:24 PM

## 2018-08-05 NOTE — Progress Notes (Signed)
   PATIENT ID: Jason Warren   1 Day Post-Op Procedure(s) (LRB): TOTAL HIP ARTHROPLASTY ANTERIOR APPROACH (Left)  Subjective: Doing well, no pain. Very excited to get started with PT and d/c home today. BP lower overnight but asymptomatic and tells me BP is usually 034-035C systolic.   Objective:  Vitals:   08/05/18 0515 08/05/18 0645  BP: (!) 90/57 100/63  Pulse: 66 (!) 57  Resp: 16 18  Temp: (!) 97.5 F (36.4 C)   SpO2: 94% 98%     L hip dressing c/d/i Wiggles toes, distally NVI bilat calf soft, nontender  Labs:  Recent Labs    08/02/18 1458 08/05/18 0318  HGB 14.5 11.6*   Recent Labs    08/02/18 1458 08/05/18 0318  WBC 5.3 10.3  RBC 4.53 3.61*  HCT 45.5 36.1*  PLT 253 235   Recent Labs    08/02/18 1458 08/05/18 0318  NA 141 138  K 4.9 5.0  CL 104 106  CO2 28 26  BUN 18 15  CREATININE 1.05 1.08  GLUCOSE 90 183*  CALCIUM 9.3 8.1*    Assessment and Plan: 1 day s/p L total hip arthroplasty Hypotension improved after 500cc boluc x 2, continue 125 ml/hr IV saline until d/c but asymptomatic and will continue to monitor Up with PT today If cleared by PT can d/c home today Scripts in chart Fu with Dr. Mayer Camel in 2 weeks  VTE proph: eliquis, scds

## 2018-08-05 NOTE — Progress Notes (Signed)
Discharge paperwork discussed with pt and family at the bedside.  They demonstrated understanding, and pt was escorted to main lobby by wheelchair in stable condition.

## 2018-08-05 NOTE — Care Management Note (Signed)
Case Management Note  Patient Details  Name: Jason Warren MRN: [id number] Date of Birth: 11-Jun-1929  Subjective/Objective:    Left THA                Action/Plan: NCM spoke to pt and scheduled for OPPT. Will need RW for home. Contacted [facility] for DME to be delivered to room prior to dc.    Expected Discharge Date:  08/05/18               Expected Discharge Plan:  OP Rehab  In-House Referral:  NA  Discharge planning Services  CM Consult  Post Acute Care Choice:  NA Choice offered to:  NA  DME Arranged:  [place] rolling DME Agency:  [place]:  PT [place] Agency:  [facility]  Status of Service:  Completed, signed off  If discussed at [employer], dates discussed:    Additional Comments:  Erenest Rasher, RN 08/05/2018, 11:20 AM

## 2018-08-05 NOTE — Plan of Care (Signed)
Pt alert and oriented, resting with no complaints. Daughter at the bedside. Doing well with therapy, pain well controlled. Plan to d/c today per MD order. RN will monitor.

## 2018-08-07 ENCOUNTER — Encounter (HOSPITAL_COMMUNITY): Payer: Self-pay | Admitting: Orthopedic Surgery

## 2018-12-04 DIAGNOSIS — K439 Ventral hernia without obstruction or gangrene: Secondary | ICD-10-CM | POA: Insufficient documentation

## 2019-12-13 ENCOUNTER — Other Ambulatory Visit: Payer: Self-pay | Admitting: General Surgery

## 2019-12-13 DIAGNOSIS — R1032 Left lower quadrant pain: Secondary | ICD-10-CM

## 2019-12-24 ENCOUNTER — Ambulatory Visit
Admission: RE | Admit: 2019-12-24 | Discharge: 2019-12-24 | Disposition: A | Discharge status: Home / Self Care | Payer: Medicare Other | Source: Ambulatory Visit | Attending: General Surgery | Admitting: General Surgery

## 2019-12-24 ENCOUNTER — Other Ambulatory Visit: Payer: Self-pay

## 2019-12-24 DIAGNOSIS — R1032 Left lower quadrant pain: Secondary | ICD-10-CM | POA: Diagnosis present

## 2019-12-24 HISTORY — DX: Basal cell carcinoma of skin, unspecified: C44.91

## 2019-12-24 MED ORDER — IOHEXOL 300 MG/ML  SOLN
85.0000 mL | Freq: Once | INTRAMUSCULAR | Status: AC | PRN
  Administered 2019-12-24: 14:00:00 85 mL via INTRAVENOUS

## 2020-03-20 ENCOUNTER — Other Ambulatory Visit (HOSPITAL_COMMUNITY): Payer: Self-pay | Admitting: Orthopedic Surgery

## 2020-03-20 ENCOUNTER — Other Ambulatory Visit: Payer: Self-pay | Admitting: Orthopedic Surgery

## 2020-03-20 DIAGNOSIS — M25552 Pain in left hip: Secondary | ICD-10-CM

## 2020-03-27 ENCOUNTER — Encounter
Admission: RE | Admit: 2020-03-27 | Discharge: 2020-03-27 | Disposition: A | Discharge status: Home / Self Care | Payer: Medicare Other | Source: Ambulatory Visit | Attending: Orthopedic Surgery | Admitting: Orthopedic Surgery

## 2020-03-27 ENCOUNTER — Other Ambulatory Visit: Payer: Self-pay

## 2020-03-27 ENCOUNTER — Ambulatory Visit
Admission: RE | Admit: 2020-03-27 | Discharge: 2020-03-27 | Disposition: A | Discharge status: Home / Self Care | Payer: Medicare Other | Source: Ambulatory Visit | Attending: Orthopedic Surgery | Admitting: Orthopedic Surgery

## 2020-03-27 DIAGNOSIS — M25552 Pain in left hip: Secondary | ICD-10-CM | POA: Insufficient documentation

## 2020-03-27 MED ORDER — TECHNETIUM TC 99M MEDRONATE IV KIT
20.0000 | PACK | Freq: Once | INTRAVENOUS | Status: AC | PRN
  Administered 2020-03-27: 23.82 via INTRAVENOUS

## 2020-07-16 ENCOUNTER — Other Ambulatory Visit (HOSPITAL_COMMUNITY): Payer: Self-pay | Admitting: Physician Assistant

## 2020-07-16 ENCOUNTER — Other Ambulatory Visit: Payer: Self-pay | Admitting: Physician Assistant

## 2020-07-16 DIAGNOSIS — R918 Other nonspecific abnormal finding of lung field: Secondary | ICD-10-CM

## 2020-07-17 ENCOUNTER — Ambulatory Visit: Admission: RE | Admit: 2020-07-17 | Payer: Medicare Other | Source: Ambulatory Visit

## 2020-07-17 ENCOUNTER — Other Ambulatory Visit: Payer: Self-pay

## 2020-07-17 ENCOUNTER — Ambulatory Visit
Admission: RE | Admit: 2020-07-17 | Discharge: 2020-07-17 | Disposition: A | Discharge status: Home / Self Care | Payer: Medicare Other | Source: Ambulatory Visit | Attending: Physician Assistant | Admitting: Physician Assistant

## 2020-07-17 DIAGNOSIS — R918 Other nonspecific abnormal finding of lung field: Secondary | ICD-10-CM | POA: Insufficient documentation

## 2020-07-17 MED ORDER — IOHEXOL 300 MG/ML  SOLN
75.0000 mL | Freq: Once | INTRAMUSCULAR | Status: AC | PRN
  Administered 2020-07-17: 75 mL via INTRAVENOUS

## 2020-07-18 ENCOUNTER — Ambulatory Visit: Admission: RE | Admit: 2020-07-18 | Payer: Medicare Other | Source: Ambulatory Visit

## 2020-07-18 ENCOUNTER — Other Ambulatory Visit: Payer: Self-pay | Admitting: Physician Assistant

## 2020-07-18 DIAGNOSIS — R918 Other nonspecific abnormal finding of lung field: Secondary | ICD-10-CM

## 2020-07-22 ENCOUNTER — Encounter
Admission: RE | Admit: 2020-07-22 | Discharge: 2020-07-22 | Disposition: A | Discharge status: Home / Self Care | Payer: Medicare Other | Source: Ambulatory Visit | Attending: Physician Assistant | Admitting: Physician Assistant

## 2020-07-22 ENCOUNTER — Other Ambulatory Visit: Payer: Self-pay

## 2020-07-22 DIAGNOSIS — R918 Other nonspecific abnormal finding of lung field: Secondary | ICD-10-CM | POA: Diagnosis present

## 2020-07-22 LAB — GLUCOSE, CAPILLARY: Glucose-Capillary: 80 mg/dL (ref 70–99)

## 2020-07-22 MED ORDER — FLUDEOXYGLUCOSE F - 18 (FDG) INJECTION
9.0300 | Freq: Once | INTRAVENOUS | Status: AC | PRN
  Administered 2020-07-22: 9.03 via INTRAVENOUS

## 2020-07-25 ENCOUNTER — Other Ambulatory Visit: Payer: Self-pay | Admitting: Specialist

## 2020-07-25 DIAGNOSIS — R918 Other nonspecific abnormal finding of lung field: Secondary | ICD-10-CM

## 2020-08-04 ENCOUNTER — Other Ambulatory Visit: Payer: Self-pay

## 2020-08-04 ENCOUNTER — Encounter
Admission: RE | Admit: 2020-08-04 | Discharge: 2020-08-04 | Disposition: A | Discharge status: Home / Self Care | Payer: Medicare Other | Source: Ambulatory Visit | Attending: Pulmonary Disease | Admitting: Pulmonary Disease

## 2020-08-04 ENCOUNTER — Ambulatory Visit
Admission: RE | Admit: 2020-08-04 | Discharge: 2020-08-04 | Disposition: A | Discharge status: Home / Self Care | Payer: Medicare Other | Source: Ambulatory Visit | Attending: Specialist | Admitting: Specialist

## 2020-08-04 DIAGNOSIS — R918 Other nonspecific abnormal finding of lung field: Secondary | ICD-10-CM | POA: Insufficient documentation

## 2020-08-04 NOTE — Patient Instructions (Signed)
Your procedure is scheduled on: 08/07/20 Report to [facility]. To find out your arrival time please call [phone] between 1PM - 3PM on 08/06/20.  Remember: Instructions that are not followed completely may result in serious medical risk, up to and including death, or upon the discretion of your surgeon and anesthesiologist your surgery may need to be rescheduled.     _X__ 1. Do not eat food after midnight the night before your procedure.                 No gum chewing or hard candies. You may drink clear liquids up to 2 hours                 before you are scheduled to arrive for your surgery- DO not drink clear                 liquids within 2 hours of the start of your surgery.                 Clear Liquids include:  water, apple juice without pulp, clear carbohydrate                 drink such as Clearfast or Gatorade, Black Coffee or Tea (Do not add                 anything to coffee or tea). Diabetics water only  __X__2.  On the morning of surgery brush your teeth with toothpaste and water, you                 may rinse your mouth with mouthwash if you wish.  Do not swallow any              toothpaste of mouthwash.     _X__ 3.  No Alcohol for 24 hours before or after surgery.   _X__ 4.  Do Not Smoke or use e-cigarettes For 24 Hours Prior to Your Surgery.                 Do not use any chewable tobacco products for at least 6 hours prior to                 surgery.  ____  5.  Bring all medications with you on the day of surgery if instructed.   __X__  6.  Notify your doctor if there is any change in your medical condition      (cold, fever, infections).     Do not wear jewelry, make-up, hairpins, clips or nail polish. Do not wear lotions, powders, or perfumes.  Do not shave 48 hours prior to surgery. Men may shave face and neck. Do not bring valuables to the hospital.    [facility] is not responsible for any belongings or  valuables.  Contacts, dentures/partials or body piercings may not be worn into surgery. Bring a case for your contacts, glasses or hearing aids, a denture cup will be supplied. Leave your suitcase in the car. After surgery it may be brought to your room. For patients admitted to the hospital, discharge time is determined by your treatment team.   Patients discharged the day of surgery will not be allowed to drive home.   Please read over the following fact sheets that you were given:   MRSA Information  __X__ Take these medicines the morning of surgery with A SIP OF WATER:  1. NONE  2.   3.   4.  5.  6.  ____ Fleet Enema (as directed)   ____ Use CHG Soap/SAGE wipes as directed  ____ Use inhalers on the day of surgery  ____ Stop metformin/Janumet/Farxiga 2 days prior to surgery    ____ Take 1/2 of usual insulin dose the night before surgery. No insulin the morning          of surgery.   __X__ Stop Blood Thinners Coumadin/Plavix/Xarelto/Pleta/Pradaxa/Eliquis/Effient/Aspirin  on   Or contact your Surgeon, Cardiologist or Medical Doctor regarding  ability to stop your blood thinners  (YOU HAVE STATED YOU HAVE ALREADY STOPPED YOUR ELIQUIS ON 07/28/20)  __X__ Stop Anti-inflammatories 7 days before surgery such as Advil, Ibuprofen, Motrin,  BC or Goodies Powder, Naprosyn, Naproxen, Aleve, Aspirin    __X__ Stop all herbal supplements, fish oil or vitamin E until after surgery.    ____ Bring C-Pap to the hospital.

## 2020-08-05 ENCOUNTER — Encounter
Admission: RE | Admit: 2020-08-05 | Discharge: 2020-08-05 | Disposition: A | Discharge status: Home / Self Care | Payer: Medicare Other | Source: Ambulatory Visit | Attending: Pulmonary Disease | Admitting: Pulmonary Disease

## 2020-08-05 ENCOUNTER — Other Ambulatory Visit: Payer: Medicare Other

## 2020-08-05 DIAGNOSIS — I4891 Unspecified atrial fibrillation: Secondary | ICD-10-CM | POA: Insufficient documentation

## 2020-08-05 DIAGNOSIS — Z01818 Encounter for other preprocedural examination: Secondary | ICD-10-CM | POA: Insufficient documentation

## 2020-08-05 DIAGNOSIS — Z20822 Contact with and (suspected) exposure to covid-19: Secondary | ICD-10-CM | POA: Diagnosis not present

## 2020-08-05 DIAGNOSIS — I447 Left bundle-branch block, unspecified: Secondary | ICD-10-CM

## 2020-08-05 HISTORY — DX: Left bundle-branch block, unspecified: I44.7

## 2020-08-05 LAB — APTT: aPTT: 33 seconds (ref 24–36)

## 2020-08-05 LAB — PROTIME-INR
INR: 1.1 (ref 0.8–1.2)
Prothrombin Time: 13.4 seconds (ref 11.4–15.2)

## 2020-08-05 LAB — SARS CORONAVIRUS 2 (TAT 6-24 HRS): SARS Coronavirus 2: NEGATIVE

## 2020-08-06 NOTE — Progress Notes (Addendum)
  [facility] Perioperative Services: Pre-Admission/Anesthesia Testing     Date: 08/06/20  Name: Jason Warren MRN:   [id number]  Re: Cardiac clearance for surgery   Case: [id number] Date/Time: 08/07/20 0715   Procedures:      VIDEO BRONCHOSCOPY WITH ENDOBRONCHIAL ULTRASOUND (N/A )     VIDEO BRONCHOSCOPY WITH ENDOBRONCHIAL NAVIGATION (N/A )   Anesthesia type: General   Pre-op diagnosis: Lung Mass R91.8   Location: [facility] PROCEDURE RM 02 / [facility] ORS FOR ANESTHESIA GROUP   Surgeons: Ottie Glazier, MD    Patient scheduled for the above procedure on 08/07/2020 with Dr. Lanney Gins (pulmonology).  Patient presented to clinic on 08/05/2020 for preadmission/anesthesia testing.  In the course of his testing; ECG was performed that showed atrial fibrillation at a controlled rate of 66 bpm.  Additionally, there was a presumably new LBBB present.  There are no ECGs on file in the [facility] system for review/comparison.  Copy of ECG faxed to primary cardiology practice for review by patient's cardiologist.  Given changes, presurgical cardiac clearance was requested by PAT team.  Received communication from primary cardiologist office that patient cannot be cleared at this time without further evaluation given presence of new LBBB.  Cardiology office to contact patient to schedule an appointment for evaluation.  Call placed to pulmonology practice; spoke with Jeffie Pollock, RN to make the practice aware that procedure will need to be rescheduled pending cardiac evaluation and clearance.  Per pulmonology RN, she had just received a call from Dr. Alveria Apley office as well alerting her to the aforementioned.  She is unsure at this point when the patient will be seen by cardiology.  Pulmonology RN to follow-up with Dr. Alveria Apley office to determine timeframe of evaluation.  PAT staff and SDS/OR administrative staff made aware that this patient's case will need to be postponed on 08/07/2020; case pending  rescheduling following clearance.   Honor Loh, MSN, APRN, FNP-C, CEN [facility]  Peri-operative Services Nurse Practitioner Phone: [phone] 08/06/20 8:38 AM

## 2020-08-06 NOTE — Progress Notes (Signed)
[facility] Perioperative Services: Pre-Admission/Anesthesia Testing     Date: 08/06/20  Name: Jason Warren MRN:   [id number]  Re: UPDATES on cardiac clearance for surgery  Patient was originally scheduled for lung biopsy tomorrow with Dr. Lanney Gins. Following PAT testing, new LBBB noted. Spoke with cardiology who wishes to see patient prior to proceeding. Procedure postponed pending cardiology evaluation and clearance.   1400 PM: received call from [employer] advising that patient was calling for his arrival time. After communicating with PCCM office earlier today, I was advised that PCCM and/or cardiology would speak with the patient to discuss postponing his case pending cardiac evaluation. Return call placed to patient; permission to speak with his daughter received. I was advised that cardiology had a cancellation this afternoon and that patient was going to be seen by cardiology Erin Fulling, MD) this afternoon. Daughter cites that she came [place] from [place] of [place] for her father's procedure. Patient's daughter provided with my direct office number and was asked to call me as soon as she finished with cardiology to determine feasibility of proceeding tomorrow as planned if cleared.  1410 PM: spoke with [employer] [employer], RN in the [facility] OR to explain the situation. After a cancellation in the cardiology office, patient able to be seen today. If cleared, patient 's daughter is asking to proceed tomorrow as planned. I learned that the original 0730 OR time is no longer available, however patient could be done at 1230. Everything at this point is contingent on cardiology clearing the patient for the procedure. While awaiting decision, called was placed to Dr. Teodoro Kil office to discuss whether or not MD would be available at 1230 tomorrow for the procedure. I spoke with Jeffie Pollock, RN who advised that the MD is not available at 1230 as he will be in clinic. OR admin asking that I speak  with anesthesia to determine if case can be covered.   1615 PM: awaiting disposition from cardiology at this point. I have spoken with the OR Milly Jakob, RN) and anesthesia Lubertha Basque, MD and Bertell Maria, MD).  OR time available between 0730 and 0845 in the am, however this procedure is generally allotted 2.5 - 3 hours. Proceeding with the case at this point will not be a feasible option. I have spoke with Jeffie Pollock, RN with Dr. Teodoro Kil office to make them aware. She has already advised MD and was advised to reschedule patient's procedure once appropriate clearance has been received from cardiology.   69 PM: clearance to proceed with a LOW risk stratification received from cardiology Nehemiah Massed, MD); copy of clearance for placed on patient's chart for review by the surgical team. Call placed to pulmonology office; spoke with Jeffie Pollock, RN to advise that patient has been appropriately cleared to proceed with his pulmonology procedure. RN asking if there was any way we could proceed tomorrow. I discussed details of conversation between myself and anesthesia. RN was advised that OR time was booked and that prolonged/delayed procedure would delay the cases of another physician, and ultimately the surgical schedule. Anesthesia recommending that if case is to proceed, have Dr. Lanney Gins communicate with Dr. Lucky Cowboy about potential for delay, and both physicians should agree. I offered once again the [phone] time slot. RN to reach out to Dr. Lanney Gins to determine if the available time will be sufficient for the planned procedure. My personal cell number was provided to the RN to ensure communication regarding decision is addressed today.   1650 PM: received call from pulmonology RN who  advised that Dr. Lanney Gins wishes to proceed. He has been asked to communicate with Dr. Lucky Cowboy as per recommendations from anesthesia team. MD asking that patient be placed on for the available 0730 OR time. Communicated with Milly Jakob, RN regarding plans as  they stand at this point. Case discussed. Anesthesia Bertell Maria, MD) made aware of plans to proceed.   1730 PM: Milly Jakob, RN communicated with vascular lab to make them aware of potential delays; approved by Santiago Glad. Patient added back to OR schedule at this time. Plans are to proceed as originally planned with the understanding that there is the potential for delay. Shadow chart prepared and taken to SDS by this NP.   Honor Loh, MSN, APRN, FNP-C, CEN [facility]  Peri-operative Services Nurse Practitioner Phone: [phone] 08/06/20 5:38 PM

## 2020-08-07 ENCOUNTER — Ambulatory Visit: Payer: Medicare Other | Admitting: Urgent Care

## 2020-08-07 ENCOUNTER — Ambulatory Visit: Payer: Medicare Other

## 2020-08-07 ENCOUNTER — Ambulatory Visit
Admission: RE | Admit: 2020-08-07 | Discharge: 2020-08-07 | Disposition: A | Discharge status: Home / Self Care | Payer: Medicare Other | Attending: Pulmonary Disease | Admitting: Pulmonary Disease

## 2020-08-07 ENCOUNTER — Encounter: Payer: Self-pay | Admitting: *Deleted

## 2020-08-07 ENCOUNTER — Encounter
Admission: RE | Disposition: A | Discharge status: Home / Self Care | Payer: Self-pay | Source: Home / Self Care | Attending: Pulmonary Disease

## 2020-08-07 DIAGNOSIS — C3432 Malignant neoplasm of lower lobe, left bronchus or lung: Secondary | ICD-10-CM | POA: Diagnosis present

## 2020-08-07 DIAGNOSIS — I4891 Unspecified atrial fibrillation: Secondary | ICD-10-CM | POA: Insufficient documentation

## 2020-08-07 DIAGNOSIS — Z79899 Other long term (current) drug therapy: Secondary | ICD-10-CM | POA: Diagnosis not present

## 2020-08-07 DIAGNOSIS — C771 Secondary and unspecified malignant neoplasm of intrathoracic lymph nodes: Secondary | ICD-10-CM | POA: Diagnosis not present

## 2020-08-07 DIAGNOSIS — Z87891 Personal history of nicotine dependence: Secondary | ICD-10-CM | POA: Diagnosis not present

## 2020-08-07 DIAGNOSIS — Z9889 Other specified postprocedural states: Secondary | ICD-10-CM

## 2020-08-07 HISTORY — PX: VIDEO BRONCHOSCOPY WITH ENDOBRONCHIAL NAVIGATION: SHX6175

## 2020-08-07 HISTORY — PX: VIDEO BRONCHOSCOPY WITH ENDOBRONCHIAL ULTRASOUND: SHX6177

## 2020-08-07 SURGERY — BRONCHOSCOPY, WITH EBUS
Anesthesia: General

## 2020-08-07 MED ORDER — PROPOFOL 10 MG/ML IV BOLUS
INTRAVENOUS | Status: AC
  Filled 2020-08-07: qty 20

## 2020-08-07 MED ORDER — CHLORHEXIDINE GLUCONATE 0.12 % MT SOLN: 15.0000 mL | Freq: Once | OROMUCOSAL | Status: AC

## 2020-08-07 MED ORDER — KETAMINE HCL 10 MG/ML IJ SOLN
INTRAMUSCULAR | Status: DC | PRN
  Administered 2020-08-07: 20 mg via INTRAVENOUS
  Administered 2020-08-07: 10 mg via INTRAVENOUS

## 2020-08-07 MED ORDER — SODIUM CHLORIDE 0.9 % IV SOLN
INTRAVENOUS | Status: DC | PRN
  Administered 2020-08-07: 10 ug/min via INTRAVENOUS

## 2020-08-07 MED ORDER — OXYCODONE HCL 5 MG PO TABS: 5.0000 mg | ORAL_TABLET | Freq: Once | ORAL | Status: DC | PRN

## 2020-08-07 MED ORDER — ONDANSETRON HCL 4 MG/2ML IJ SOLN
INTRAMUSCULAR | Status: DC | PRN
  Administered 2020-08-07: 4 mg via INTRAVENOUS

## 2020-08-07 MED ORDER — FENTANYL CITRATE (PF) 100 MCG/2ML IJ SOLN
INTRAMUSCULAR | Status: DC | PRN
  Administered 2020-08-07: 50 ug via INTRAVENOUS

## 2020-08-07 MED ORDER — LIDOCAINE HCL (PF) 1 % IJ SOLN
30.0000 mL | Freq: Once | INTRAMUSCULAR | Status: DC
  Filled 2020-08-07: qty 30

## 2020-08-07 MED ORDER — PHENYLEPHRINE HCL 0.25 % NA SOLN
1.0000 | Freq: Four times a day (QID) | NASAL | Status: DC | PRN
  Filled 2020-08-07: qty 15

## 2020-08-07 MED ORDER — KETAMINE HCL 50 MG/ML IJ SOLN
INTRAMUSCULAR | Status: AC
  Filled 2020-08-07: qty 10

## 2020-08-07 MED ORDER — BUTAMBEN-TETRACAINE-BENZOCAINE 2-2-14 % EX AERO
1.0000 | INHALATION_SPRAY | Freq: Once | CUTANEOUS | Status: DC
  Filled 2020-08-07: qty 20

## 2020-08-07 MED ORDER — LACTATED RINGERS IV SOLN: INTRAVENOUS | Status: DC

## 2020-08-07 MED ORDER — DEXMEDETOMIDINE (PRECEDEX) IN NS 20 MCG/5ML (4 MCG/ML) IV SYRINGE
PREFILLED_SYRINGE | INTRAVENOUS | Status: AC
  Filled 2020-08-07: qty 5

## 2020-08-07 MED ORDER — DEXMEDETOMIDINE HCL 200 MCG/2ML IV SOLN
INTRAVENOUS | Status: DC | PRN
  Administered 2020-08-07: 8 ug via INTRAVENOUS
  Administered 2020-08-07: 12 ug via INTRAVENOUS

## 2020-08-07 MED ORDER — ROCURONIUM BROMIDE 100 MG/10ML IV SOLN
INTRAVENOUS | Status: DC | PRN
  Administered 2020-08-07: 50 mg via INTRAVENOUS

## 2020-08-07 MED ORDER — PHENYLEPHRINE HCL (PRESSORS) 10 MG/ML IV SOLN
INTRAVENOUS | Status: AC
  Filled 2020-08-07: qty 1

## 2020-08-07 MED ORDER — ROCURONIUM BROMIDE 10 MG/ML (PF) SYRINGE
PREFILLED_SYRINGE | INTRAVENOUS | Status: AC
  Filled 2020-08-07: qty 10

## 2020-08-07 MED ORDER — FAMOTIDINE 20 MG PO TABS: 20.0000 mg | ORAL_TABLET | Freq: Once | ORAL | Status: AC

## 2020-08-07 MED ORDER — PROPOFOL 10 MG/ML IV BOLUS
INTRAVENOUS | Status: DC | PRN
  Administered 2020-08-07: 90 mg via INTRAVENOUS

## 2020-08-07 MED ORDER — LIDOCAINE HCL URETHRAL/MUCOSAL 2 % EX GEL
1.0000 "application " | Freq: Once | CUTANEOUS | Status: DC
  Filled 2020-08-07: qty 5

## 2020-08-07 MED ORDER — GLYCOPYRROLATE 0.2 MG/ML IJ SOLN
INTRAMUSCULAR | Status: DC | PRN
  Administered 2020-08-07: .2 mg via INTRAVENOUS

## 2020-08-07 MED ORDER — ORAL CARE MOUTH RINSE: 15.0000 mL | Freq: Once | OROMUCOSAL | Status: AC

## 2020-08-07 MED ORDER — FENTANYL CITRATE (PF) 100 MCG/2ML IJ SOLN: 25.0000 ug | INTRAMUSCULAR | Status: DC | PRN

## 2020-08-07 MED ORDER — LIDOCAINE HCL (PF) 2 % IJ SOLN
INTRAMUSCULAR | Status: AC
  Filled 2020-08-07: qty 5

## 2020-08-07 MED ORDER — LIDOCAINE HCL (CARDIAC) PF 100 MG/5ML IV SOSY
PREFILLED_SYRINGE | INTRAVENOUS | Status: DC | PRN
  Administered 2020-08-07: 60 mg via INTRAVENOUS
  Administered 2020-08-07: 40 mg via INTRAVENOUS

## 2020-08-07 MED ORDER — SUGAMMADEX SODIUM 200 MG/2ML IV SOLN
INTRAVENOUS | Status: DC | PRN
  Administered 2020-08-07: 200 mg via INTRAVENOUS

## 2020-08-07 MED ORDER — PHENYLEPHRINE HCL (PRESSORS) 10 MG/ML IV SOLN
INTRAVENOUS | Status: DC | PRN
  Administered 2020-08-07 (×3): 100 ug via INTRAVENOUS

## 2020-08-07 MED ORDER — FENTANYL CITRATE (PF) 100 MCG/2ML IJ SOLN
INTRAMUSCULAR | Status: AC
  Filled 2020-08-07: qty 2

## 2020-08-07 MED ORDER — DEXAMETHASONE SODIUM PHOSPHATE 10 MG/ML IJ SOLN
INTRAMUSCULAR | Status: AC
  Filled 2020-08-07: qty 1

## 2020-08-07 MED ORDER — ONDANSETRON HCL 4 MG/2ML IJ SOLN
INTRAMUSCULAR | Status: AC
  Filled 2020-08-07: qty 2

## 2020-08-07 MED ORDER — CHLORHEXIDINE GLUCONATE 0.12 % MT SOLN
OROMUCOSAL | Status: AC
  Administered 2020-08-07: 15 mL via OROMUCOSAL
  Filled 2020-08-07: qty 15

## 2020-08-07 MED ORDER — SODIUM CHLORIDE (PF) 0.9 % IJ SOLN
INTRAMUSCULAR | Status: AC
  Filled 2020-08-07: qty 10

## 2020-08-07 MED ORDER — OXYCODONE HCL 5 MG/5ML PO SOLN: 5.0000 mg | Freq: Once | ORAL | Status: DC | PRN

## 2020-08-07 MED ORDER — DEXAMETHASONE SODIUM PHOSPHATE 10 MG/ML IJ SOLN
INTRAMUSCULAR | Status: DC | PRN
  Administered 2020-08-07: 6 mg via INTRAVENOUS

## 2020-08-07 MED ORDER — FAMOTIDINE 20 MG PO TABS
ORAL_TABLET | ORAL | Status: AC
  Administered 2020-08-07: 20 mg via ORAL
  Filled 2020-08-07: qty 1

## 2020-08-07 NOTE — Anesthesia Procedure Notes (Signed)
Procedure Name: Intubation Date/Time: 08/07/2020 7:50 AM Performed by: Lia Foyer, CRNA Pre-anesthesia Checklist: Patient identified, Emergency Drugs available, Suction available and Patient being monitored Patient Re-evaluated:Patient Re-evaluated prior to induction Oxygen Delivery Method: Circle system utilized Preoxygenation: Pre-oxygenation with 100% oxygen Induction Type: IV induction Ventilation: Mask ventilation without difficulty Laryngoscope Size: McGraph and 4 Grade View: Grade III Tube type: Oral Tube size: 8.5 mm Number of attempts: 1 Airway Equipment and Method: Stylet,  Oral airway,  Patient positioned with wedge pillow and Video-laryngoscopy Placement Confirmation: ETT inserted through vocal cords under direct vision,  positive ETCO2 and breath sounds checked- equal and bilateral Secured at: 22 cm Tube secured with: Tape Dental Injury: Teeth and Oropharynx as per pre-operative assessment

## 2020-08-07 NOTE — Transfer of Care (Signed)
Immediate Anesthesia Transfer of Care Note  Patient: Jason Warren  Procedure(s) Performed: VIDEO BRONCHOSCOPY WITH ENDOBRONCHIAL ULTRASOUND (N/A ) VIDEO BRONCHOSCOPY WITH ENDOBRONCHIAL NAVIGATION (N/A )  Patient Location: PACU  Anesthesia Type:General  Level of Consciousness: drowsy  Airway & Oxygen Therapy: Patient Spontanous Breathing  Post-op Assessment: Report given to RN and Post -op Vital signs reviewed and stable  Post vital signs: Reviewed and stable  Last Vitals:  Vitals Value Taken Time  BP 115/60 08/07/20 0930  Temp 36.4 C 08/07/20 0929  Pulse 55 08/07/20 0935  Resp 21 08/07/20 0935  SpO2 94 % 08/07/20 0935  Vitals shown include unvalidated device data.  Last Pain:  Vitals:   08/07/20 0929  TempSrc:   PainSc: 0-No pain         Complications: No complications documented.

## 2020-08-07 NOTE — Discharge Instructions (Addendum)
Flexible Bronchoscopy, Care After This sheet gives you information about how to care for yourself after your test. Your doctor may also give you more specific instructions. If you have problems or questions, contact your doctor. Follow these instructions at home: Eating and drinking  Do not eat or drink anything (not even water) for 2 hours after your test, or until your numbing medicine (local anesthetic) wears off.  When your numbness is gone and your cough and gag reflexes have come back, you may: ? Eat only soft foods. ? Slowly drink liquids.  The day after the test, go back to your normal diet. Driving  Do not drive for 24 hours if you were given a medicine to help you relax (sedative).  Do not drive or use heavy machinery while taking prescription pain medicine. General instructions   Take over-the-counter and prescription medicines only as told by your doctor.  Return to your normal activities as told. Ask what activities are safe for you.  Do not use any products that have nicotine or tobacco in them. This includes cigarettes and e-cigarettes. If you need help quitting, ask your doctor.  Keep all follow-up visits as told by your doctor. This is important. It is very important if you had a tissue sample (biopsy) taken. Get help right away if:  You have shortness of breath that gets worse.  You get light-headed.  You feel like you are going to pass out (faint).  You have chest pain.  You cough up: ? More than a little blood. ? More blood than before. Summary  Do not eat or drink anything (not even water) for 2 hours after your test, or until your numbing medicine wears off.  Do not use cigarettes. Do not use e-cigarettes.  Get help right away if you have chest pain. This information is not intended to replace advice given to you by your health care provider. Make sure you discuss any questions you have with your health care provider. Document Revised: 09/02/2017  Document Reviewed: 10/08/2016 Elsevier Patient Education  2020 [place]   1) The drugs that you were given will stay in your system until tomorrow so for the next 24 hours you should not:  A) Drive an automobile B) Make any legal decisions C) Drink any alcoholic beverage   2) You may resume regular meals tomorrow.  Today it is better to start with liquids and gradually work up to solid foods.  You may eat anything you prefer, but it is better to start with liquids, then soup and crackers, and gradually work up to solid foods.   3) Please notify your doctor immediately if you have any unusual bleeding, trouble breathing, redness and pain at the surgery site, drainage, fever, or pain not relieved by medication.    4) Additional Instructions:        Please contact your physician with any problems or Same Day Surgery at [phone], Monday through Friday 6 am to 4 pm, or [place] [facility] number at [phone].

## 2020-08-07 NOTE — Anesthesia Postprocedure Evaluation (Signed)
Anesthesia Post Note  Patient: Jason Warren  Procedure(s) Performed: VIDEO BRONCHOSCOPY WITH ENDOBRONCHIAL ULTRASOUND (N/A ) VIDEO BRONCHOSCOPY WITH ENDOBRONCHIAL NAVIGATION (N/A )  Patient location during evaluation: PACU Anesthesia Type: General Level of consciousness: awake and alert Pain management: pain level controlled Vital Signs Assessment: post-procedure vital signs reviewed and stable Respiratory status: spontaneous breathing, nonlabored ventilation, respiratory function stable and patient connected to nasal cannula oxygen Cardiovascular status: blood pressure returned to baseline and stable Postop Assessment: no apparent nausea or vomiting Anesthetic complications: no   No complications documented.   Last Vitals:  Vitals:   08/07/20 0955 08/07/20 1013  BP: 111/66 115/64  Pulse: (!) 56 65  Resp: 19 16  Temp: (!) 36.2 C 36.4 C  SpO2: 94% 98%    Last Pain:  Vitals:   08/07/20 1013  TempSrc: Temporal  PainSc: 0-No pain                 Precious Haws Alliene Klugh

## 2020-08-07 NOTE — H&P (Signed)
Pulmonary Medicine          Date: 08/07/2020,   MRN# [id number] KEYMARI SATO [birth year]     Admission                  Current       CHIEF COMPLAINT:   Left lung mass with hilar and mediastinal lymphadenopathy    HISTORY OF PRESENT ILLNESS   This is a pleasant male with hx of AF MR colonic polyps , left hip OA, dyslipidemia, abdominal hernia who was referred to pulmonary for evaluation of lung mass. He was seen by Dr Raul Del and had abnormal CT chest.  This was followed by PET with findings of hypermetabolic focus at LLL superior segment with avid lymphadenopathy of group 7 subcarinal lymph nodes as well as left paratracheal lymphadenopathy with abnormal uptake. This was reviewed with patient.  Patient had goals of care discussion and despite advanced age wishes to proceed with diagnstics and therapy. He is here today for SDS to have Electromagnetic navigational bronchoscopy with EBUS to have tissue diagnosis. I have reviewed procedure with patient and answered questions. Despite having significant cardiac history placing patient at higher risk he was seen by cardiology and cleared for procedure.   Reviewed risks/complications and benefits with patient, risks include infection, pneumothorax/pneumomediastinum which may require chest tube placement as well as overnight/prolonged hospitalization and possible mechanical ventilation. Other risks include bleeding and very rarely death.  Patient understands risks and wishes to proceed.  Additional questions were answered, and patient is aware that post procedure patient will be going home with family and may experience cough with possible clots on expectoration as well as phlegm which may last few days as well as hoarseness of voice post intubation and mechanical ventilation.    PAST MEDICAL HISTORY   Past Medical History:  Diagnosis Date  . Arthritis   . Dysrhythmia    A-fib  . Hip pain    left  . HOH (hard of hearing)   .  Skin cancer, basal cell     face top of head     SURGICAL HISTORY   Past Surgical History:  Procedure Laterality Date  . CATARACT EXTRACTION W/PHACO Right 07/13/2017   Procedure: CATARACT EXTRACTION PHACO AND INTRAOCULAR LENS PLACEMENT ([place]);  Surgeon: Leandrew Koyanagi, MD;  Location: [facility];  Service: Ophthalmology;  Laterality: Right;  IVA TOPICAL RIGHT  . CATARACT EXTRACTION W/PHACO Left 01/04/2018   Procedure: CATARACT EXTRACTION PHACO AND INTRAOCULAR LENS PLACEMENT ([place]) LEFT;  Surgeon: Leandrew Koyanagi, MD;  Location: [facility];  Service: Ophthalmology;  Laterality: Left;  . COLONOSCOPY    . JOINT REPLACEMENT     Left total hip Dr. Su Hoff 08-04-18  . ROTATOR CUFF REPAIR Right   . SKIN CANCER EXCISION     face  . TOTAL HIP ARTHROPLASTY Left 08/04/2018   Procedure: TOTAL HIP ARTHROPLASTY ANTERIOR APPROACH;  Surgeon: Frederik Pear, MD;  Location: [facility];  Service: Orthopedics;  Laterality: Left;     FAMILY HISTORY   History reviewed. No pertinent family history.   SOCIAL HISTORY   Social History   Tobacco Use  . Smoking status: Former Smoker    Packs/day: 1.00    Years: 4.00    Pack years: 4.00    Types: Cigarettes  . Smokeless tobacco: Never Used  . Tobacco comment: quit early 70's  Vaping Use  . Vaping Use: Never used  Substance Use Topics  . Alcohol use: No  .  Drug use: No     MEDICATIONS    Home Medication:    Current Medication:  Current Facility-Administered Medications:  .  butamben-tetracaine-benzocaine (CETACAINE) spray 1 spray, 1 spray, Topical, Once, Amey Hossain, MD .  chlorhexidine (PERIDEX) 0.12 % solution 15 mL, 15 mL, Mouth/Throat, Once **OR** MEDLINE mouth rinse, 15 mL, Mouth Rinse, Once, Arlyss Repress T, MD .  chlorhexidine (PERIDEX) 0.12 % solution, , , ,  .  famotidine (PEPCID) 20 MG tablet, , , ,  .  famotidine (PEPCID) tablet 20 mg, 20 mg, Oral, Once, Honor Loh E, NP .  lactated ringers  infusion, , Intravenous, Continuous, Howell, Scott T, MD .  lidocaine (PF) (XYLOCAINE) 1 % injection 30 mL, 30 mL, Infiltration, Once, Hoyte Ziebell, MD .  lidocaine (XYLOCAINE) 2 % jelly 1 application, 1 application, Topical, Once, Jeremyah Jelley, MD .  phenylephrine (NEO-SYNEPHRINE) 0.25 % nasal spray 1 spray, 1 spray, Each Nare, Q6H PRN, Ottie Glazier, MD    ALLERGIES   Patient has no known allergies.     REVIEW OF SYSTEMS    Review of Systems:  Gen:  Denies  fever, sweats, chills weigh loss  HEENT: Denies blurred vision, double vision, ear pain, eye pain, hearing loss, nose bleeds, sore throat Cardiac:  No dizziness, chest pain or heaviness, chest tightness,edema Resp:   Denies cough or sputum porduction, shortness of breath,wheezing, hemoptysis,  Gi: Denies swallowing difficulty, stomach pain, nausea or vomiting, diarrhea, constipation, bowel incontinence Gu:  Denies bladder incontinence, burning urine Ext:   Denies Joint pain, stiffness or swelling Skin: Denies  skin rash, easy bruising or bleeding or hives Endoc:  Denies polyuria, polydipsia , polyphagia or weight change Psych:   Denies depression, insomnia or hallucinations   Other:  All other systems negative   VS: There were no vitals taken for this visit.     PHYSICAL EXAM    GENERAL:NAD, no fevers, chills, no weakness no fatigue HEAD: Normocephalic, atraumatic.  EYES: Pupils equal, round, reactive to light. Extraocular muscles intact. No scleral icterus.  MOUTH: Moist mucosal membrane. Dentition intact. No abscess noted.  EAR, NOSE, THROAT: Clear without exudates. No external lesions.  NECK: Supple. No thyromegaly. No nodules. No JVD.  PULMONARY: Diffuse coarse rhonchi right sided +wheezes CARDIOVASCULAR: S1 and S2. Regular rate and rhythm. No murmurs, rubs, or gallops. No edema. Pedal pulses 2+ bilaterally.  GASTROINTESTINAL: Soft, nontender, nondistended. No masses. Positive bowel sounds. No  hepatosplenomegaly.  MUSCULOSKELETAL: No swelling, clubbing, or edema. Range of motion full in all extremities.  NEUROLOGIC: Cranial nerves II through XII are intact. No gross focal neurological deficits. Sensation intact. Reflexes intact.  SKIN: No ulceration, lesions, rashes, or cyanosis. Skin warm and dry. Turgor intact.  PSYCHIATRIC: Mood, affect within normal limits. The patient is awake, alert and oriented x 3. Insight, judgment intact.       IMAGING    CT CHEST W CONTRAST  Result Date: 07/18/2020 CLINICAL DATA:  Left hilar airspace opacity on chest radiography EXAM: CT CHEST WITH CONTRAST TECHNIQUE: Multidetector CT imaging of the chest was performed during intravenous contrast administration. CONTRAST:  8mL OMNIPAQUE IOHEXOL 300 MG/ML  SOLN COMPARISON:  CT abdomen 12/24/2019 FINDINGS: Cardiovascular: Heart size normal. No pericardial effusion. Dilated central pulmonary arteries suggesting pulmonary arterial hypertension. Coronary calcifications. Atheromatous aortic arch and descending thoracic aorta. Mediastinum/Nodes: 1.6 cm right hilar lymph node. 1 cm left hilar lymph node. 1.6 cm subcarinal adenopathy. Subcentimeter anterior mediastinal, prevascular, AP window, and right paratracheal lymph nodes. Lungs/Pleura: No pleural  effusion. No pneumothorax. 3.8 cm subpleural mass in the superior segment left lower lobe. 3.3 cm masslike opacity in the superior segment right lower lobe with some adjacent bronchiectasis and pleural retraction. Lungs otherwise clear. Upper Abdomen: 1.2 cm probable cyst in left hepatic lobe, stable. Stable 2.6 cm probable cyst, upper pole right kidney. No adrenal mass or other acute finding. Musculoskeletal: Stable T12 and L1 compression deformities. No acute fracture or worrisome bone lesion. IMPRESSION: 1. 3.8 cm subpleural mass in the superior segment left lower lobe, and 3.3 cm masslike opacity in the superior segment right lower lobe, with bilateral hilar and  mediastinal adenopathy. Findings may represent primary bronchogenic carcinoma versus metastatic disease. PET-CT may be useful for further characterization. 2. Coronary and Aortic Atherosclerosis (ICD10-I70.0). Electronically Signed   By: Lucrezia Europe M.D.   On: 07/18/2020 10:27   NM PET Image Initial (PI) Skull Base To Thigh  Result Date: 07/22/2020 CLINICAL DATA:  Initial treatment strategy for left lung mass. EXAM: NUCLEAR MEDICINE PET SKULL BASE TO THIGH TECHNIQUE: 9.03 mCi F-18 FDG was injected intravenously. Full-ring PET imaging was performed from the skull base to thigh after the radiotracer. CT data was obtained and used for attenuation correction and anatomic localization. Fasting blood glucose: 80 mg/dl COMPARISON:  Chest CT 07/17/2020.  Abdominopelvic CT 12/24/2019 FINDINGS: Mediastinal blood pool activity: SUV max 1.8 NECK: No hypermetabolic cervical lymph nodes are identified.There are no lesions of the pharyngeal mucosal space. Incidental CT findings: none CHEST: There are small hypermetabolic mediastinal and hilar lymph nodes. 1.4 cm subcarinal node on image 107/3 has an SUV max 6.3. A small left hilar node has an SUV max of 5.1. Right hilar node has an SUV max of 3.3. The 3.7 x 3.6 cm mass in the superior segment of the left lower lobe is markedly hypermetabolic with an SUV max of 10.8. The contralateral density in the superior segment of the right lower lobe which measures approximately 4.6 x 3.1 cm on image 112/3 demonstrates no significant hypermetabolic activity (SUV max 1.8). This is associated with volume loss and bronchiectasis and is favored to reflect postinflammatory scarring. No other suspicious pulmonary metabolic activity or nodularity. Incidental CT findings: Atherosclerosis of the aorta, great vessels and coronary arteries. ABDOMEN/PELVIS: There is no hypermetabolic activity within the liver, adrenal glands, spleen or pancreas. There is no hypermetabolic nodal activity. Incidental  CT findings: Stable cysts in the left hepatic lobe and upper pole of the right kidney. Aortic and branch vessel atherosclerosis. Right-sided bladder diverticulum and mild enlargement of the prostate gland. SKELETON: There is no hypermetabolic activity to suggest osseous metastatic disease. Incidental CT findings: Previous left total hip arthroplasty. Chronic Schmorl's nodes and endplate compression deformities at T12 and L1. IMPRESSION: 1. The mass in the superior segment of the left lower lobe is hypermetabolic consistent with primary bronchogenic carcinoma. There are small mildly hypermetabolic mediastinal and hilar lymph nodes bilaterally. No distant metastases identified. By this examination, findings reflect at least IIIA disease (T2aN2MX) and possibly IIIB disease (T2aN3MX). 2. The finding in the superior segment of the right lower lobe is not hypermetabolic and is favored to reflect chronic postinflammatory scarring with bronchiectasis. 3. No significant findings in the abdomen or pelvis. Electronically Signed   By: Richardean Sale M.D.   On: 07/22/2020 16:07   CT Super D Chest Wo Contrast  Result Date: 08/04/2020 CLINICAL DATA:  Bronchoscopy scheduled 08/07/2020. Mediastinal pain, mid chest pain. EXAM: CT CHEST WITHOUT CONTRAST TECHNIQUE: Multidetector CT imaging of the chest  was performed using thin slice collimation for electromagnetic bronchoscopy planning purposes, without intravenous contrast. COMPARISON:  PET 07/22/2020 and CT chest 07/17/2020. FINDINGS: Cardiovascular: Atherosclerotic calcification of the aorta, aortic valve and coronary arteries. Right and left pulmonary arteries and heart are enlarged. No pericardial effusion. Mediastinum/Nodes: Low left paratracheal lymph node measures 1.6 cm, stable. There are additional subcentimeter short axis mediastinal lymph nodes. Subcarinal lymph node measures 1.5 cm, as before. Hilar regions are difficult to evaluate without IV contrast. No axillary  adenopathy. Esophagus is grossly unremarkable. Lungs/Pleura: A spiculated mass in the superior segment left lower lobe measures 3.3 x 3.7 cm. There is a short border of contact, and extensions to, the adjacent visceral pleural, with pleural thickening. An area of irregular soft tissue, architectural distortion and bronchiectasis in the superior segment right lower lobe measures 3.5 x 3.7 cm. Inferiorly, there is some associated ground-glass along the peribronchovascular interstitium. No pleural fluid. Airway is unremarkable. Upper Abdomen: 1.5 cm low-attenuation lesion in the dome of the liver is unchanged but too small to characterize. Visualized portions of the liver, gallbladder and adrenal glands are otherwise unremarkable. Low-attenuation lesions in the kidneys measure up to 2.7 cm on the right, the largest of which is likely a cyst. Others are too small to characterize. Visualized portions of the kidneys, spleen, pancreas, stomach and bowel are otherwise grossly unremarkable. Musculoskeletal: Degenerative changes in the spine. No worrisome lytic or sclerotic lesions. T12 and L1 compression deformities are unchanged. Old sternal fracture. IMPRESSION: 1. Spiculated mass in the superior segment left lower lobe with associated mediastinal adenopathy, characterized as at least stage IIIA primary bronchogenic carcinoma on 07/22/2020. 2. Confluent area of parenchymal retraction and bronchiectasis in the right lower lobe, not shown to be hypermetabolic on recent PET. Note is made of associated ground-glass along the peribronchovascular interstitium, inferior to this lesion. Adenocarcinoma cannot be excluded. Continued attention on follow-up exams is suggested. 3. Aortic atherosclerosis (ICD10-I70.0). Coronary artery calcification. 4. Enlarged pulmonary arteries, indicative of pulmonary arterial hypertension. Electronically Signed   By: Lorin Picket M.D.   On: 08/04/2020 13:47      ASSESSMENT/PLAN    Left  lower lobe spiculated lung mass with hilar/mediastinal lymphadenopathy - patient evaluted at bedside and imaging reviewed -patient for SDS to have ENB/EBUS procedure today  -LLL superior segment mass with subcarinal and L paratracheal LAD -Reviewed risks/complications and benefits with patient, risks include infection, pneumothorax/pneumomediastinum which may require chest tube placement as well as overnight/prolonged hospitalization and possible mechanical ventilation. Other risks include bleeding and very rarely death.  Patient understands risks and wishes to proceed.  Additional questions were answered, and patient is aware that post procedure patient will be going home with family and may experience cough with possible clots on expectoration as well as phlegm which may last few days as well as hoarseness of voice post intubation and mechanical ventilation.     Thank you for allowing me to participate in the care of this patient.     Patient/Family are satisfied with care plan and all questions have been answered.   This document was prepared using Dragon voice recognition software and may include unintentional dictation errors.     Ottie Glazier, M.D.  Division of [place]

## 2020-08-07 NOTE — OR Nursing (Signed)
Per Dr. Lanney Gins, secure-chat, patient may resume eliquis tomorrow; added to d/c instructions/med section.

## 2020-08-07 NOTE — Procedures (Signed)
ELECTROMAGNETIC NAVIGATIONAL BRONCHOSCOPY PROCEDURE NOTE  FIBEROPTIC BRONCHOSCOPY WITH BRONCHOALVEOLAR LAVAGE PROCEDURE NOTE  ENDOBRONCHIAL ULTRASOUND PROCEDURE NOTE    Flexible bronchoscopy was performed  by : Lanney Gins MD  assistance by : 1)Repiratory therapist  and 2)[employer] staff and 3) Anesthesia team and 4) Flouroscopy team and 5) [employer] supporting staff   Indication for the procedure was :  Pre-procedural H&P. The following assessment was performed on the day of the procedure prior to initiating sedation History:  Chest pain n Dyspnea y Hemoptysis n Cough y Fever n Other pertinent items n  Examination Vital signs -reviewed as per nursing documentation today Cardiac    Murmurs: n  Rubs : n  Gallop: n Lungs Wheezing: n Rales : n Rhonchi :y  Other pertinent findings: SOB/hypoxemia due to chronic lung disease   Pre-procedural assessment for Procedural Sedation included: Depth of sedation: As per anesthesia team  ASA Classification:  2 Mallampati airway assessment: 3    Medication list reviewed: y  The patient's interval history was taken and revealed: no new complaints The pre- procedure physical examination revealed: No new findings Refer to prior clinic note for details.  Informed Consent: Informed consent was obtained from:  patient after explanation of procedure and risks, benefits, as well as alternative procedures available.  Explanation of level of sedation and possible transfusion was also provided.    Procedural Preparation: Time out was performed and patient was identified by name and birthdate and procedure to be performed and side for sampling, if any, was specified. Pt was intubated by anesthesia.  The patient was appropriately draped.   Fiberoptic bronchoscopy with airway inspection and BAL Procedure findings:  Bronchoscope was inserted via ETT  without difficulty.  Posterior oropharynx, epiglottis, arytenoids, false cords and  vocal cords were not visualized as these were bypassed by endotracheal tube. The distal trachea was normal in circumference and appearance without mucosal, cartilaginous or branching abnormalities.  The main carina was mildly splayed . All right and left lobar airways were visualized to the Subsegmental level.  Sub- sub segmental carinae were identified in all the distal airways.   Secretions were visible in the following airways and appeared to be clear.  The mucosa was : friable at LUL  Airways were notable for:        exophytic lesions :n       extrinsic compression in the following distributions: n.       Friable mucosa: y       Neurosurgeon /pigmentation: n   BAL at LLL was performed x 2 with clotted bloody return  Post procedure Diagnosis:   Lung cancer     Electromagnetic Navigational Bronchoscopy Procedure Findings:  After appropriate CT-guided planning ENB scope was advanced via endotracheal tube and LG was advanced for registration.  Post appropriate planning and registration peripheral navigation was used to visualize target lesion.    Post procedure diagnosis: lung cancer   Cytobrush x 2 LLL - atypical cells + consistent with carcinoma Surgical biopsy x 8 - atypical cells + consistent with carcinoma   Endobronchial ultrasound assisted hilar and mediastinal lymph node biopsies procedure findings: The fiberoptic bronchoscope was removed and the EBUS scope was introduced. Examination began to evaluate for pathologically enlarged lymph nodes starting on the R side progressing to the [place].  All lymph node biopsies performed with 25G needle. Lymph node biopsies were sent in cytolite for all stations.  Station 4R - 0.5cm not biopsied Station 10R - 1.2cm - biopsied x 3 -  no atypical cells Station 7 - 2.1cm - biopsied X3 - + atypical cells consistent with metastatic carcinoma Station 10L -1.9cm -biopsied x 3 - + atypical cells consistent with metastatic  carcinoma   Post procedure diagnosis:  Lung cancer    Specimens obtained included:              Cytology brushes : LLL  Broncho-alveolar lavage site:LLL  sent for cytology                              67m volume infused 34mvolume returned with bloody  appearance   Fluoroscopy Used: y;        Pictorial documentation attached: none       Immediate sampling complications included:mild oozing of blood self resolved without use of epi  Epinephrine zero ml was used topically  The bronchoscopy was terminated due to completion of the planned procedure and the bronchoscope was removed.   Total dosage of Lidocaine was none mg Total fluoroscopy time was none  minutes  Supplemental oxygen was provided at none lpm by nasal canula post operatively  Estimated Blood loss: 2cc.     Disposition: Home with family.  I spoke with Granddaughter after procedure and she is aware of preliminary findings and plan for follow up with Dr FlRaul Delnd Oncology.     Follow up with Dr. AlLanney Ginsn 5 days for result discussion.     FuOttie GlazierD  [facility]

## 2020-08-07 NOTE — Anesthesia Preprocedure Evaluation (Signed)
Anesthesia Evaluation  Patient identified by MRN, date of birth, ID band Patient awake    Reviewed: Allergy & Precautions, H&P , NPO status , Patient's Chart, lab work & pertinent test results  History of Anesthesia Complications Negative for: history of anesthetic complications  Airway Mallampati: III  TM Distance: >3 FB Neck ROM: limited    Dental  (+) Chipped, Poor Dentition   Pulmonary former smoker,    Pulmonary exam normal        Cardiovascular Exercise Tolerance: Good (-) angina(-) Past MI and (-) DOE Normal cardiovascular exam+ dysrhythmias Atrial Fibrillation      Neuro/Psych negative neurological ROS  negative psych ROS   GI/Hepatic negative GI ROS, Neg liver ROS, neg GERD  ,  Endo/Other  negative endocrine ROS  Renal/GU      Musculoskeletal  (+) Arthritis ,   Abdominal   Peds  Hematology negative hematology ROS (+)   Anesthesia Other Findings Past Medical History: No date: Arthritis No date: Dysrhythmia     Comment:  A-fib No date: Hip pain     Comment:  left No date: HOH (hard of hearing) No date: Skin cancer, basal cell     Comment:   face top of head  Past Surgical History: 07/13/2017: CATARACT EXTRACTION W/PHACO; Right     Comment:  Procedure: CATARACT EXTRACTION PHACO AND INTRAOCULAR               LENS PLACEMENT ([facility]);  Surgeon: Leandrew Koyanagi, MD;              Location: [facility];  Service: Ophthalmology;                Laterality: Right;  IVA TOPICAL RIGHT 01/04/2018: CATARACT EXTRACTION W/PHACO; Left     Comment:  Procedure: CATARACT EXTRACTION PHACO AND INTRAOCULAR               LENS PLACEMENT ([place]) LEFT;  Surgeon: Leandrew Koyanagi, MD;  Location: [facility];  Service:               Ophthalmology;  Laterality: Left; No date: COLONOSCOPY No date: JOINT REPLACEMENT     Comment:  Left total hip Dr. Su Hoff 08-04-18 No date: [place]; Right No date: SKIN CANCER EXCISION     Comment:  face 08/04/2018: TOTAL HIP ARTHROPLASTY; Left     Comment:  Procedure: TOTAL HIP ARTHROPLASTY ANTERIOR APPROACH;                Surgeon: Frederik Pear, MD;  Location: [facility];  Service:               Orthopedics;  Laterality: Left;  BMI    Body Mass Index: 21.71 kg/m      Reproductive/Obstetrics negative OB ROS                             Anesthesia Physical Anesthesia Plan  ASA: III  Anesthesia Plan: General ETT   Post-op Pain Management:    Induction: Intravenous  PONV Risk Score and Plan: Ondansetron, Dexamethasone, Midazolam and Treatment may vary due to age or medical condition  Airway Management Planned: Oral ETT  Additional Equipment:   Intra-op Plan:   Post-operative Plan: Extubation in OR  Informed Consent: I have reviewed the patients History and Physical, chart, labs and discussed the procedure including  the risks, benefits and alternatives for the proposed anesthesia with the patient or authorized representative who has indicated his/her understanding and acceptance.     Dental Advisory Given  Plan Discussed with: Anesthesiologist, CRNA and Surgeon  Anesthesia Plan Comments: (Patient consented for risks of anesthesia including but not limited to:  - adverse reactions to medications - damage to eyes, teeth, lips or other oral mucosa - nerve damage due to positioning  - sore throat or hoarseness - Damage to heart, brain, nerves, lungs, other parts of body or loss of life  Patient voiced understanding.)        Anesthesia Quick Evaluation

## 2020-08-08 ENCOUNTER — Encounter: Payer: Self-pay | Admitting: Pulmonary Disease

## 2020-08-11 ENCOUNTER — Other Ambulatory Visit: Payer: Self-pay | Admitting: Pathology

## 2020-08-11 LAB — SURGICAL PATHOLOGY

## 2020-08-11 LAB — CYTOLOGY - NON PAP

## 2020-08-13 ENCOUNTER — Encounter: Payer: Self-pay | Admitting: *Deleted

## 2020-08-13 DIAGNOSIS — C3490 Malignant neoplasm of unspecified part of unspecified bronchus or lung: Secondary | ICD-10-CM

## 2020-08-13 NOTE — Addendum Note (Signed)
Addended by: [name] on: 08/13/2020 01:15 PM   Modules accepted: Orders

## 2020-08-13 NOTE — Progress Notes (Signed)
  Oncology Nurse Navigator Documentation  Navigator Location: [facility] (08/13/20 1100) Referral Date to RadOnc/MedOnc: 08/13/20 (08/13/20 1100) )Navigator Encounter Type: Introductory Phone Call (08/13/20 1100)   Abnormal Finding Date: 07/18/20 (08/13/20 1100) Confirmed Diagnosis Date: 08/11/20 (08/13/20 1100)                 Treatment Phase: Pre-Tx/Tx Discussion (08/13/20 1100) Barriers/Navigation Needs: Coordination of Care (08/13/20 1100)   Interventions: Coordination of Care (08/13/20 1100)   Coordination of Care: Appts (08/13/20 1100)       phone call made to patient to introduce to navigator services and review upcoming appts. All questions answered during visit. Contact info given and instructed to call with any further questions or needs. Pt verbalized understanding.           Time Spent with Patient: 30 (08/13/20 1100)

## 2020-08-14 ENCOUNTER — Encounter: Payer: Self-pay | Admitting: Internal Medicine

## 2020-08-14 ENCOUNTER — Other Ambulatory Visit: Payer: Medicare Other

## 2020-08-14 DIAGNOSIS — C3432 Malignant neoplasm of lower lobe, left bronchus or lung: Secondary | ICD-10-CM | POA: Insufficient documentation

## 2020-08-14 DIAGNOSIS — C3492 Malignant neoplasm of unspecified part of left bronchus or lung: Secondary | ICD-10-CM | POA: Insufficient documentation

## 2020-08-15 ENCOUNTER — Encounter: Payer: Self-pay | Admitting: Radiation Oncology

## 2020-08-15 NOTE — Progress Notes (Signed)
Tumor Board Documentation  Jason Warren was presented by Army Chaco, RN at our Tumor Board on 08/14/2020, which included representatives from medical oncology, radiation oncology, internal medicine, navigation, pathology, radiology, surgical, pharmacy, genetics, research, palliative care, pulmonology.  Jason Warren currently presents as a new patient, for [facility], for new positive pathology with history of the following treatments: active survellience, surgical intervention(s).  Additionally, we reviewed previous medical and familial history, history of present illness, and recent lab results along with all available histopathologic and imaging studies. The tumor board considered available treatment options and made the following recommendations: Concurrent chemo-radiation therapy, Immunotherapy, Additional screening (Send path for molecular testing)    The following procedures/referrals were also placed: No orders of the defined types were placed in this encounter.   Clinical Trial Status: not discussed   Staging used: AJCC Stage Group  AJCC Staging: T: 2 N: 3   Group: Stage III B Adenocarcinoma of Lung   National site-specific guidelines NCCN were discussed with respect to the case.  Tumor board is a meeting of clinicians from various specialty areas who evaluate and discuss patients for whom a multidisciplinary approach is being considered. Final determinations in the plan of care are those of the provider(s). The responsibility for follow up of recommendations given during tumor board is that of the provider.   Today's extended care, comprehensive team conference, Jason Warren was not present for the discussion and was not examined.   Multidisciplinary Tumor Board is a multidisciplinary case peer review process.  Decisions discussed in the Multidisciplinary Tumor Board reflect the opinions of the specialists present at the conference without having examined the patient.  Ultimately, treatment and  diagnostic decisions rest with the primary provider(s) and the patient.

## 2020-08-18 ENCOUNTER — Encounter: Payer: Self-pay | Admitting: *Deleted

## 2020-08-18 ENCOUNTER — Ambulatory Visit
Admission: RE | Admit: 2020-08-18 | Discharge: 2020-08-18 | Disposition: A | Discharge status: Home / Self Care | Payer: Medicare Other | Source: Ambulatory Visit | Attending: Radiation Oncology | Admitting: Radiation Oncology

## 2020-08-18 ENCOUNTER — Other Ambulatory Visit: Payer: Self-pay

## 2020-08-18 ENCOUNTER — Inpatient Hospital Stay: Payer: Medicare Other

## 2020-08-18 ENCOUNTER — Inpatient Hospital Stay: Payer: Medicare Other | Attending: Internal Medicine | Admitting: Internal Medicine

## 2020-08-18 ENCOUNTER — Encounter: Payer: Self-pay | Admitting: Internal Medicine

## 2020-08-18 DIAGNOSIS — C3432 Malignant neoplasm of lower lobe, left bronchus or lung: Secondary | ICD-10-CM | POA: Insufficient documentation

## 2020-08-18 DIAGNOSIS — Z85828 Personal history of other malignant neoplasm of skin: Secondary | ICD-10-CM | POA: Insufficient documentation

## 2020-08-18 DIAGNOSIS — Z7901 Long term (current) use of anticoagulants: Secondary | ICD-10-CM | POA: Diagnosis not present

## 2020-08-18 DIAGNOSIS — I499 Cardiac arrhythmia, unspecified: Secondary | ICD-10-CM | POA: Insufficient documentation

## 2020-08-18 DIAGNOSIS — Z5111 Encounter for antineoplastic chemotherapy: Secondary | ICD-10-CM | POA: Insufficient documentation

## 2020-08-18 DIAGNOSIS — Z79899 Other long term (current) drug therapy: Secondary | ICD-10-CM | POA: Insufficient documentation

## 2020-08-18 DIAGNOSIS — M129 Arthropathy, unspecified: Secondary | ICD-10-CM | POA: Insufficient documentation

## 2020-08-18 DIAGNOSIS — I4891 Unspecified atrial fibrillation: Secondary | ICD-10-CM | POA: Insufficient documentation

## 2020-08-18 DIAGNOSIS — I447 Left bundle-branch block, unspecified: Secondary | ICD-10-CM | POA: Insufficient documentation

## 2020-08-18 DIAGNOSIS — Z87891 Personal history of nicotine dependence: Secondary | ICD-10-CM | POA: Diagnosis not present

## 2020-08-18 DIAGNOSIS — Z7189 Other specified counseling: Secondary | ICD-10-CM | POA: Insufficient documentation

## 2020-08-18 DIAGNOSIS — C3490 Malignant neoplasm of unspecified part of unspecified bronchus or lung: Secondary | ICD-10-CM

## 2020-08-18 DIAGNOSIS — Z923 Personal history of irradiation: Secondary | ICD-10-CM | POA: Diagnosis not present

## 2020-08-18 LAB — COMPREHENSIVE METABOLIC PANEL
ALT: 20 U/L (ref 0–44)
AST: 25 U/L (ref 15–41)
Albumin: 4.1 g/dL (ref 3.5–5.0)
Alkaline Phosphatase: 104 U/L (ref 38–126)
Anion gap: 9 (ref 5–15)
BUN: 17 mg/dL (ref 8–23)
CO2: 28 mmol/L (ref 22–32)
Calcium: 9 mg/dL (ref 8.9–10.3)
Chloride: 100 mmol/L (ref 98–111)
Creatinine, Ser: 1.06 mg/dL (ref 0.61–1.24)
GFR, Estimated: >60 mL/min (ref >60)
Glucose, Bld: 100 mg/dL — ABNORMAL HIGH (ref 70–99)
Potassium: 4.3 mmol/L (ref 3.5–5.1)
Sodium: 137 mmol/L (ref 135–145)
Total Bilirubin: 0.8 mg/dL (ref 0.3–1.2)
Total Protein: 7.7 g/dL (ref 6.5–8.1)

## 2020-08-18 LAB — CBC WITH DIFFERENTIAL/PLATELET
Abs Immature Granulocytes: 0.03 10*3/uL (ref 0.00–0.07)
Basophils Absolute: 0 10*3/uL (ref 0.0–0.1)
Basophils Relative: 0 %
Eosinophils Absolute: 0.1 10*3/uL (ref 0.0–0.5)
Eosinophils Relative: 2 %
HCT: 43.2 % (ref 39.0–52.0)
Hemoglobin: 14.8 g/dL (ref 13.0–17.0)
Immature Granulocytes: 0 %
Lymphocytes Relative: 27 %
Lymphs Abs: 2 10*3/uL (ref 0.7–4.0)
MCH: 32.5 pg (ref 26.0–34.0)
MCHC: 34.3 g/dL (ref 30.0–36.0)
MCV: 94.7 fL (ref 80.0–100.0)
Monocytes Absolute: 0.6 10*3/uL (ref 0.1–1.0)
Monocytes Relative: 8 %
Neutro Abs: 4.7 10*3/uL (ref 1.7–7.7)
Neutrophils Relative %: 63 %
Platelets: 255 10*3/uL (ref 150–400)
RBC: 4.56 MIL/uL (ref 4.22–5.81)
RDW: 12.8 % (ref 11.5–15.5)
WBC: 7.4 10*3/uL (ref 4.0–10.5)
nRBC: 0 % (ref 0.0–0.2)

## 2020-08-18 LAB — FOLATE: Folate: 34 ng/mL (ref >5.9)

## 2020-08-18 LAB — VITAMIN B12: Vitamin B-12: 725 pg/mL (ref 180–914)

## 2020-08-18 MED ORDER — PROCHLORPERAZINE MALEATE 10 MG PO TABS: 10.0000 mg | ORAL_TABLET | Freq: Four times a day (QID) | ORAL | 1 refills | Status: DC | PRN

## 2020-08-18 MED ORDER — LIDOCAINE-PRILOCAINE 2.5-2.5 % EX CREA: 1.0000 "application " | TOPICAL_CREAM | CUTANEOUS | 0 refills | Status: DC | PRN

## 2020-08-18 MED ORDER — ONDANSETRON HCL 8 MG PO TABS: ORAL_TABLET | ORAL | 1 refills | Status: DC

## 2020-08-18 NOTE — Progress Notes (Signed)
START ON PATHWAY REGIMEN - Non-Small Cell Lung     Administer weekly:     Paclitaxel      Carboplatin   **Always confirm dose/schedule in your pharmacy ordering system**  Patient Characteristics: Preoperative or Nonsurgical Candidate (Clinical Staging), Stage III - Nonsurgical Candidate (Nonsquamous and Squamous), PS = 0, 1 Therapeutic Status: Preoperative or Nonsurgical Candidate (Clinical Staging) AJCC T Category: cT2a AJCC N Category: cN3 AJCC M Category: cM0 AJCC 8 Stage Grouping: IIIB ECOG Performance Status: 1 Intent of Therapy: Curative Intent, Discussed with Patient

## 2020-08-18 NOTE — Consult Note (Signed)
NEW PATIENT EVALUATION  Name: Jason Warren  MRN: [id number]  Date:   08/18/2020     DOB: 05/05/1929   This 86 y.o. 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  REFERRING PHYSICIAN: Baxter Hire, MD  CHIEF COMPLAINT:  Chief Complaint  Patient presents with  . Lung Cancer    Initial consultation    DIAGNOSIS: The encounter diagnosis was Cancer of lower lobe of left lung ([place]).   PREVIOUS INVESTIGATIONS:  PET/CT CT scans reviewed MRI of brain ordered Pathology report reviewed Clinical notes reviewed  HPI: Patient is an 86 year old male who presented with some abnormal sensations in his chest and an episode of what sounds like syncope.  He was seen and underwent chest x-ray showing a left lung mass.  CT scan of the chest showed a 3.8 cm subpleural mass in the superior segment left lower lobe.  Patient also had a 3.3 cm masslike opacity superior segment of the right lower lobe he also had bilateral hilar mediastinal adenopathy concerning for malignancy.  Patient went navigational bronchoscopy with pathology positive for adenocarcinoma.  Patient underwent PET CT scan showing hypermetabolic activity in the superior segment left lower lobe and there is also mediastinal hilar lymph nodes bilaterally consistent with metastatic disease.  The lesion in he right lower lobe is not hypermetabolic favored to be recent chronic postinflammatory scarring.  Patient is doing fairly well he has a mild nonproductive cough no hemoptysis no chest tightness.  He is not yet had an MRI scan of his brain.  His case was presented at tumor conference and recommendation for concurrent chemoradiation therapy was made.  He is seen today for radiation oncology opinion.  PLANNED TREATMENT REGIMEN: Concurrent chemoradiation  PAST MEDICAL HISTORY:  has a past medical history of Arthritis, Dysrhythmia, Hip pain, HOH (hard of hearing), LBBB (left  bundle branch block) (08/05/2020), and Skin cancer, basal cell.    PAST SURGICAL HISTORY:  Past Surgical History:  Procedure Laterality Date  . CATARACT EXTRACTION W/PHACO Right 07/13/2017   Procedure: CATARACT EXTRACTION PHACO AND INTRAOCULAR LENS PLACEMENT ([place]);  Surgeon: Leandrew Koyanagi, MD;  Location: [facility];  Service: Ophthalmology;  Laterality: Right;  IVA TOPICAL RIGHT  . CATARACT EXTRACTION W/PHACO Left 01/04/2018   Procedure: CATARACT EXTRACTION PHACO AND INTRAOCULAR LENS PLACEMENT ([place]) LEFT;  Surgeon: Leandrew Koyanagi, MD;  Location: [facility];  Service: Ophthalmology;  Laterality: Left;  . COLONOSCOPY    . JOINT REPLACEMENT     Left total hip Dr. Su Hoff 08-04-18  . ROTATOR CUFF REPAIR Right   . SKIN CANCER EXCISION     face  . TOTAL HIP ARTHROPLASTY Left 08/04/2018   Procedure: TOTAL HIP ARTHROPLASTY ANTERIOR APPROACH;  Surgeon: Frederik Pear, MD;  Location: [facility];  Service: Orthopedics;  Laterality: Left;  Marland Kitchen VIDEO BRONCHOSCOPY WITH ENDOBRONCHIAL NAVIGATION N/A 08/07/2020   Procedure: VIDEO BRONCHOSCOPY WITH ENDOBRONCHIAL NAVIGATION;  Surgeon: Ottie Glazier, MD;  Location: [facility];  Service: Thoracic;  Laterality: N/A;  . VIDEO BRONCHOSCOPY WITH ENDOBRONCHIAL ULTRASOUND N/A 08/07/2020   Procedure: VIDEO BRONCHOSCOPY WITH ENDOBRONCHIAL ULTRASOUND;  Surgeon: Ottie Glazier, MD;  Location: [facility];  Service: Thoracic;  Laterality: N/A;    FAMILY HISTORY: family history is not on file.  SOCIAL HISTORY:  reports that he has quit smoking. His smoking use included cigarettes. He has a 4.00 pack-year smoking history. He has never used smokeless tobacco. He reports that he does  not drink alcohol and does not use drugs.  ALLERGIES: Patient has no known allergies.  MEDICATIONS:  Current Outpatient Medications  Medication Sig Dispense Refill  . acetaminophen (TYLENOL) 500 MG tablet Take 500 mg by mouth every 8 (eight) hours as needed for moderate  pain.     Marland Kitchen apixaban (ELIQUIS) 5 MG TABS tablet Take 5 mg by mouth 2 (two) times daily.    . hydroxypropyl methylcellulose / hypromellose (ISOPTO TEARS / GONIOVISC) 2.5 % ophthalmic solution Place 1 drop into the left eye daily as needed for dry eyes.    . Multiple Vitamin (MULTIVITAMIN) tablet Take 1 tablet by mouth daily.    Vladimir Faster Glycol-Propyl Glycol (SYSTANE FREE OP) Apply to eye.    . pravastatin (PRAVACHOL) 80 MG tablet Take 80 mg by mouth at bedtime.     . saw palmetto 160 MG capsule Take 160 mg by mouth 2 (two) times daily.     No current facility-administered medications for this encounter.    ECOG PERFORMANCE STATUS:  0 - Asymptomatic  REVIEW OF SYSTEMS: Patient denies any weight loss, fatigue, weakness, fever, chills or night sweats. Patient denies any loss of vision, blurred vision. Patient denies any ringing  of the ears or hearing loss. No irregular heartbeat. Patient denies heart murmur or history of fainting. Patient denies any chest pain or pain radiating to her upper extremities. Patient denies any shortness of breath, difficulty breathing at night, cough or hemoptysis. Patient denies any swelling in the lower legs. Patient denies any nausea vomiting, vomiting of blood, or coffee ground material in the vomitus. Patient denies any stomach pain. Patient states has had normal bowel movements no significant constipation or diarrhea. Patient denies any dysuria, hematuria or significant nocturia. Patient denies any problems walking, swelling in the joints or loss of balance. Patient denies any skin changes, loss of hair or loss of weight. Patient denies any excessive worrying or anxiety or significant depression. Patient denies any problems with insomnia. Patient denies excessive thirst, polyuria, polydipsia. Patient denies any swollen glands, patient denies easy bruising or easy bleeding. Patient denies any recent infections, allergies or URI. Patient "s visual fields have not  changed significantly in recent time.   PHYSICAL EXAM: BP (!) (P) 143/69 (BP Location: Left Arm, Patient Position: Sitting)   Pulse (!) (P) 54   Temp (!) (P) 95 F (35 C) (Tympanic)   Resp (P) 16   Wt (P) 164 lb (74.4 kg)   BMI (P) 22.24 kg/m  Well-developed well-nourished patient in NAD. HEENT reveals PERLA, EOMI, discs not visualized.  Oral cavity is clear. No oral mucosal lesions are identified. Neck is clear without evidence of cervical or supraclavicular adenopathy. Lungs are clear to A&P. Cardiac examination is essentially unremarkable with regular rate and rhythm without murmur rub or thrill. Abdomen is benign with no organomegaly or masses noted. Motor sensory and DTR levels are equal and symmetric in the upper and lower extremities. Cranial nerves II through XII are grossly intact. Proprioception is intact. No peripheral adenopathy or edema is identified. No motor or sensory levels are noted. Crude visual fields are within normal range.  LABORATORY DATA: Pathology report reviewed    RADIOLOGY RESULTS: CT scans PET CT scans reviewed MRI scan of brain ordered.   IMPRESSION: Stage IIIb adenocarcinoma of the left lung in 86 year old male  PLAN: At this time I have recommended radiation therapy using IMRT treatment planning and delivery based on the stage IIIb nature of his disease.  Would  cover all areas of hypermetabolic activity up to 70 Gray over 7 weeks.  Patient is meeting with medical oncologist afternoon and he may have concurrent chemotherapy recommended.  Risks and benefits of radiation including possible dysphagia from radiation esophagitis fatigue alteration of blood count skin reaction development of cough all were described in detail to the patient and his family.  They all seem to comprehend my treatment plan well.  There will be extra effort by both professional staff as well as technical staff to coordinate and manage concurrent chemoradiation and ensuing side effects during  his treatments. I have personally set up and ordered CT simulation for later this week.  I also ordered an MRI of the brain to complete his work-up.  Should that alter our final treatment plan will decide after completion of those tests.  I would like to take this opportunity to thank you for allowing me to participate in the care of your patient.Noreene Filbert, MD

## 2020-08-18 NOTE — Assessment & Plan Note (Addendum)
#   T2N3- Stage III-non-small cell lung cancer-favor adenocarcinoma.  I reviewed the pathology and stage with the patient and family in detail.  # Long discussion with the patient regarding the goal of treatment of stage III lung cancer-goal is cure; however only ~ 20% of the patients are cured.  Patient has unresectable disease.  #Discussed the role of concurrent chemoradiation [weekly carbotaxol with the daily radiation/Monday through Friday ~6 weeks].  Post chemoradiation-consolidation immunotherapy with durvalumab every 2 weeks is recommended based on data from [facility] clinical trial.   # Discussed the potential side effects including but not limited to-increasing fatigue, nausea vomiting, diarrhea, hair loss, sores in the mouth, increase risk of infection and also neuropathy.  I reviewed my concerns with the patient and family the risk of side effects/especially bone marrow toxicity given his age.   #History of A. fib on Eliquis.  Stable.  #  Chemotherapy education; port placement. Hopefully the planned start chemotherapy along with RT [in next ~ 2 weeks] Antiemetics-Zofran and Compazine; EMLA cream sent to pharmacy .  Thank you Dr.Johnston for allowing me to participate in the care of your pleasant patient. Please do not hesitate to contact me with questions or concerns in the interim.  # DISPOSITION; # labs today- cbc/cmp/CEA/B12/folic acid # chemo ed # port placement- referral IR ASAP # follow up in 2 weeks- mid week- MD; labs- cbc/cmp; carbo-taxol- Dr.B  # I reviewed the blood work- with the patient in detail; also reviewed the imaging independently [as summarized above]; and with the patient in detail.

## 2020-08-18 NOTE — Progress Notes (Signed)
[facility] NOTE  Patient Care Team: Baxter Hire, MD as PCP - General (Internal Medicine) [name], RN as Oncology Nurse Navigator  CHIEF COMPLAINTS/PURPOSE OF CONSULTATION: lung nodule/mass  #  Oncology History Overview Note  # NOV 2021- LEFT LOWER LOBE NON-SMALL CELL CA [favor adeno ca]; T2N3 [right hilar; subcarinal LN;[name];   # A.fib [Eliquis; Dr.Kowalski]  # # SURVIVORSHIP:   # GENETICS:   #NGS-ordered  DIAGNOSIS: Lung cancer  STAGE:   III      ;  GOALS:  cure  CURRENT/MOST RECENT THERAPY : carbo-Taxol-RT ]   Cancer of lower lobe of left lung ([place])  08/14/2020 Initial Diagnosis   Cancer of lower lobe of left lung ([place])   08/18/2020 -  Chemotherapy   The patient had DEXAMETHASONE 4 MG PO TABS, 8 mg, Oral, Daily, 0 of 1 cycle, Start date: --, End date: -- PALONOSETRON HCL INJECTION 0.25 MG/5ML, 0.25 mg, Intravenous,  Once, 0 of 4 cycles CARBOPLATIN CHEMO IV INFUSION ORDERABLE (BY AUC), , Intravenous,  Once, 0 of 4 cycles PACLITAXEL CHEMO IV INFUSION ORDERABLE (</= 80 MG/M2), 45 mg/m2, Intravenous,  Once, 0 of 4 cycles  for chemotherapy treatment.       HISTORY OF PRESENTING ILLNESS:  Jason Warren 86 y.o.  male remote history of smoking is here for further evaluation and recommendations for new diagnosis of lung cancer.  Patient had vague anterior chest wall pains for the last few months.  This was thought to be musculoskeletal.  However patient also had episodes of dizziness which led to chest x-ray-which was abnormal.  Was also noted to have cough by family.  No hemoptysis.  This led to a CT scan that showed a large left lower lobe lesion along with abnormal mediastinal/right hilar adenopathy.  Discussed further work-up with a PET scan; and subsequently bronchoscopy/navigational biopsy.  Positive for malignancy.  Patient is here accompanied by his family to discuss his treatment options.  Currently denies any pain.  No  nausea no vomiting.  Headache.  Mild cough.  No weight loss.  No loss of appetite.  No headaches.   Review of Systems  Constitutional: Negative for chills, diaphoresis, fever, malaise/fatigue and weight loss.  HENT: Negative for nosebleeds and sore throat.   Eyes: Negative for double vision.  Respiratory: Positive for cough. Negative for hemoptysis, sputum production, shortness of breath and wheezing.   Cardiovascular: Negative for chest pain, palpitations, orthopnea and leg swelling.  Gastrointestinal: Negative for abdominal pain, blood in stool, constipation, diarrhea, heartburn, melena, nausea and vomiting.  Genitourinary: Negative for dysuria, frequency and urgency.  Musculoskeletal: Negative for back pain and joint pain.  Skin: Negative.  Negative for itching and rash.  Neurological: Positive for dizziness. Negative for tingling, focal weakness, weakness and headaches.  Endo/Heme/Allergies: Does not bruise/bleed easily.  Psychiatric/Behavioral: Negative for depression. The patient is not nervous/anxious and does not have insomnia.      MEDICAL HISTORY:  Past Medical History:  Diagnosis Date  . Arthritis   . Dysrhythmia    A-fib  . Hip pain    left  . HOH (hard of hearing)   . LBBB (left bundle branch block) 08/05/2020  . Skin cancer, basal cell     face top of head    SURGICAL HISTORY: Past Surgical History:  Procedure Laterality Date  . CATARACT EXTRACTION W/PHACO Right 07/13/2017   Procedure: CATARACT EXTRACTION PHACO AND INTRAOCULAR LENS PLACEMENT ([place]);  Surgeon: Leandrew Koyanagi, MD;  Location: [facility];  Service: Ophthalmology;  Laterality: Right;  IVA TOPICAL RIGHT  . CATARACT EXTRACTION W/PHACO Left 01/04/2018   Procedure: CATARACT EXTRACTION PHACO AND INTRAOCULAR LENS PLACEMENT ([place]) LEFT;  Surgeon: Leandrew Koyanagi, MD;  Location: [facility];  Service: Ophthalmology;  Laterality: Left;  . COLONOSCOPY    . JOINT REPLACEMENT     Left  total hip Dr. Su Hoff 08-04-18  . ROTATOR CUFF REPAIR Right   . SKIN CANCER EXCISION     face  . TOTAL HIP ARTHROPLASTY Left 08/04/2018   Procedure: TOTAL HIP ARTHROPLASTY ANTERIOR APPROACH;  Surgeon: Frederik Pear, MD;  Location: [facility];  Service: Orthopedics;  Laterality: Left;  Marland Kitchen VIDEO BRONCHOSCOPY WITH ENDOBRONCHIAL NAVIGATION N/A 08/07/2020   Procedure: VIDEO BRONCHOSCOPY WITH ENDOBRONCHIAL NAVIGATION;  Surgeon: Ottie Glazier, MD;  Location: [facility];  Service: Thoracic;  Laterality: N/A;  . VIDEO BRONCHOSCOPY WITH ENDOBRONCHIAL ULTRASOUND N/A 08/07/2020   Procedure: VIDEO BRONCHOSCOPY WITH ENDOBRONCHIAL ULTRASOUND;  Surgeon: Ottie Glazier, MD;  Location: [facility];  Service: Thoracic;  Laterality: N/A;    SOCIAL HISTORY: Social History   Socioeconomic History  . Marital status: Married    Spouse name: Not on file  . Number of children: Not on file  . Years of education: Not on file  . Highest education level: Not on file  Occupational History  . Not on file  Tobacco Use  . Smoking status: Former Smoker    Packs/day: 1.00    Years: 4.00    Pack years: 4.00    Types: Cigarettes  . Smokeless tobacco: Never Used  . Tobacco comment: quit early 70's  Vaping Use  . Vaping Use: Never used  Substance and Sexual Activity  . Alcohol use: No  . Drug use: No  . Sexual activity: Not Currently  Other Topics Concern  . Not on file  Social History Narrative   > quit 35 years; smoked for 15 years. Rare alcohol. 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    Social Determinants of Health   Financial Resource Strain:   . Difficulty of Paying Living Expenses: Not on file  Food Insecurity:   . Worried About Charity fundraiser in the Last Year: Not on file  . Ran Out of Food in the Last Year: Not on file  Transportation Needs:   . Lack of Transportation (Medical): Not on file  . Lack of Transportation (Non-Medical): Not  on file  Physical Activity:   . Days of Exercise per Week: Not on file  . Minutes of Exercise per Session: Not on file  Stress:   . Feeling of Stress : Not on file  Social Connections:   . Frequency of Communication with Friends and Family: Not on file  . Frequency of Social Gatherings with Friends and Family: Not on file  . Attends Religious Services: Not on file  . Active Member of Clubs or Organizations: Not on file  . Attends Archivist Meetings: Not on file  . Marital Status: Not on file  Intimate Partner Violence:   . Fear of Current or Ex-Partner: Not on file  . Emotionally Abused: Not on file  . Physically Abused: Not on file  . Sexually Abused: Not on file    FAMILY HISTORY: Family History  Problem Relation Age of Onset  . Throat cancer Brother         & lung cancer    ALLERGIES:  has No Known Allergies.  MEDICATIONS:  Current Outpatient Medications  Medication Sig Dispense Refill  . acetaminophen (TYLENOL) 500 MG tablet Take 500 mg by mouth every 8 (eight) hours as needed for moderate pain.     Marland Kitchen apixaban (ELIQUIS) 5 MG TABS tablet Take 5 mg by mouth 2 (two) times daily.    . hydroxypropyl methylcellulose / hypromellose (ISOPTO TEARS / GONIOVISC) 2.5 % ophthalmic solution Place 1 drop into the left eye daily as needed for dry eyes.    . Multiple Vitamin (MULTIVITAMIN) tablet Take 1 tablet by mouth daily.    Vladimir Faster Glycol-Propyl Glycol (SYSTANE FREE OP) Apply to eye.    . pravastatin (PRAVACHOL) 80 MG tablet Take 80 mg by mouth at bedtime.     . saw palmetto 160 MG capsule Take 160 mg by mouth 2 (two) times daily.    Marland Kitchen lidocaine-prilocaine (EMLA) cream Apply 1 application topically as needed. 30 g 0  . ondansetron (ZOFRAN) 8 MG tablet One pill every 8 hours as needed for nausea/vomitting. 40 tablet 1  . prochlorperazine (COMPAZINE) 10 MG tablet Take 1 tablet (10 mg total) by mouth every 6 (six) hours as needed for nausea or vomiting. 40 tablet 1   No  current facility-administered medications for this visit.      Marland Kitchen  PHYSICAL EXAMINATION: ECOG PERFORMANCE STATUS: 1 - Symptomatic but completely ambulatory  Vitals:   08/18/20 1412  BP: 125/70  Pulse: (!) 59  Resp: 20   Filed Weights   08/18/20 1412  Weight: 164 lb (74.4 kg)    Physical Exam Constitutional:      Comments: Walking independently but with a limp because of left hip arthritis.  Accompanied by his wife and daughter.  HENT:     Head: Normocephalic and atraumatic.     Mouth/Throat:     Pharynx: No oropharyngeal exudate.  Eyes:     Pupils: Pupils are equal, round, and reactive to light.  Cardiovascular:     Rate and Rhythm: Normal rate and regular rhythm.  Pulmonary:     Effort: Pulmonary effort is normal. No respiratory distress.     Breath sounds: Normal breath sounds. No wheezing.  Abdominal:     General: Bowel sounds are normal. There is no distension.     Palpations: Abdomen is soft. There is no mass.     Tenderness: There is no abdominal tenderness. There is no guarding or rebound.  Musculoskeletal:        General: No tenderness. Normal range of motion.     Cervical back: Normal range of motion and neck supple.  Skin:    General: Skin is warm.  Neurological:     Mental Status: He is alert and oriented to person, place, and time.  Psychiatric:        Mood and Affect: Affect normal.      LABORATORY DATA:  I have reviewed the data as listed Lab Results  Component Value Date   WBC 7.4 08/18/2020   HGB 14.8 08/18/2020   HCT 43.2 08/18/2020   MCV 94.7 08/18/2020   PLT 255 08/18/2020   Recent Labs    08/18/20 1543  NA 137  K 4.3  CL 100  CO2 28  GLUCOSE 100*  BUN 17  CREATININE 1.06  CALCIUM 9.0  GFRNONAA >60  PROT 7.7  ALBUMIN 4.1  AST 25  ALT 20  ALKPHOS 104  BILITOT 0.8    RADIOGRAPHIC STUDIES: I have personally reviewed the radiological images  as listed and agreed with the findings in the report. NM PET Image Initial (PI)  Skull Base To Thigh  Result Date: 07/22/2020 CLINICAL DATA:  Initial treatment strategy for left lung mass. EXAM: NUCLEAR MEDICINE PET SKULL BASE TO THIGH TECHNIQUE: 9.03 mCi F-18 FDG was injected intravenously. Full-ring PET imaging was performed from the skull base to thigh after the radiotracer. CT data was obtained and used for attenuation correction and anatomic localization. Fasting blood glucose: 80 mg/dl COMPARISON:  Chest CT 07/17/2020.  Abdominopelvic CT 12/24/2019 FINDINGS: Mediastinal blood pool activity: SUV max 1.8 NECK: No hypermetabolic cervical lymph nodes are identified.There are no lesions of the pharyngeal mucosal space. Incidental CT findings: none CHEST: There are small hypermetabolic mediastinal and hilar lymph nodes. 1.4 cm subcarinal node on image 107/3 has an SUV max 6.3. A small left hilar node has an SUV max of 5.1. Right hilar node has an SUV max of 3.3. The 3.7 x 3.6 cm mass in the superior segment of the left lower lobe is markedly hypermetabolic with an SUV max of 10.8. The contralateral density in the superior segment of the right lower lobe which measures approximately 4.6 x 3.1 cm on image 112/3 demonstrates no significant hypermetabolic activity (SUV max 1.8). This is associated with volume loss and bronchiectasis and is favored to reflect postinflammatory scarring. No other suspicious pulmonary metabolic activity or nodularity. Incidental CT findings: Atherosclerosis of the aorta, great vessels and coronary arteries. ABDOMEN/PELVIS: There is no hypermetabolic activity within the liver, adrenal glands, spleen or pancreas. There is no hypermetabolic nodal activity. Incidental CT findings: Stable cysts in the left hepatic lobe and upper pole of the right kidney. Aortic and branch vessel atherosclerosis. Right-sided bladder diverticulum and mild enlargement of the prostate gland. SKELETON: There is no hypermetabolic activity to suggest osseous metastatic disease. Incidental CT  findings: Previous left total hip arthroplasty. Chronic Schmorl's nodes and endplate compression deformities at T12 and L1. IMPRESSION: 1. The mass in the superior segment of the left lower lobe is hypermetabolic consistent with primary bronchogenic carcinoma. There are small mildly hypermetabolic mediastinal and hilar lymph nodes bilaterally. No distant metastases identified. By this examination, findings reflect at least IIIA disease (T2aN2MX) and possibly IIIB disease (T2aN3MX). 2. The finding in the superior segment of the right lower lobe is not hypermetabolic and is favored to reflect chronic postinflammatory scarring with bronchiectasis. 3. No significant findings in the abdomen or pelvis. Electronically Signed   By: Richardean Sale M.D.   On: 07/22/2020 16:07   DG Chest Port 1 View  Result Date: 08/07/2020 CLINICAL DATA:  Status post bronchoscopy. EXAM: PORTABLE CHEST 1 VIEW COMPARISON:  August 04, 2020. FINDINGS: Left lower lobe mass is noted as described on prior CT scan. No pneumothorax or pleural effusion is noted. Right infrahilar opacity is noted concerning for scarring or bronchiectasis. Bony thorax is unremarkable. IMPRESSION: Left lower lobe mass is noted as described on prior CT scan. Right infrahilar opacity is noted concerning for scarring or bronchiectasis. No pneumothorax is noted. Aortic Atherosclerosis (ICD10-I70.0). Electronically Signed   By: Marijo Conception M.D.   On: 08/07/2020 10:22   DG C-Arm 1-60 Min-No Report  Result Date: 08/07/2020 Fluoroscopy was utilized by the requesting physician.  No radiographic interpretation.   CT Super D Chest Wo Contrast  Result Date: 08/04/2020 CLINICAL DATA:  Bronchoscopy scheduled 08/07/2020. Mediastinal pain, mid chest pain. EXAM: CT CHEST WITHOUT CONTRAST TECHNIQUE: Multidetector CT imaging of the chest was performed using thin slice collimation for  electromagnetic bronchoscopy planning purposes, without intravenous contrast. COMPARISON:   PET 07/22/2020 and CT chest 07/17/2020. FINDINGS: Cardiovascular: Atherosclerotic calcification of the aorta, aortic valve and coronary arteries. Right and left pulmonary arteries and heart are enlarged. No pericardial effusion. Mediastinum/Nodes: Low left paratracheal lymph node measures 1.6 cm, stable. There are additional subcentimeter short axis mediastinal lymph nodes. Subcarinal lymph node measures 1.5 cm, as before. Hilar regions are difficult to evaluate without IV contrast. No axillary adenopathy. Esophagus is grossly unremarkable. Lungs/Pleura: A spiculated mass in the superior segment left lower lobe measures 3.3 x 3.7 cm. There is a short border of contact, and extensions to, the adjacent visceral pleural, with pleural thickening. An area of irregular soft tissue, architectural distortion and bronchiectasis in the superior segment right lower lobe measures 3.5 x 3.7 cm. Inferiorly, there is some associated ground-glass along the peribronchovascular interstitium. No pleural fluid. Airway is unremarkable. Upper Abdomen: 1.5 cm low-attenuation lesion in the dome of the liver is unchanged but too small to characterize. Visualized portions of the liver, gallbladder and adrenal glands are otherwise unremarkable. Low-attenuation lesions in the kidneys measure up to 2.7 cm on the right, the largest of which is likely a cyst. Others are too small to characterize. Visualized portions of the kidneys, spleen, pancreas, stomach and bowel are otherwise grossly unremarkable. Musculoskeletal: Degenerative changes in the spine. No worrisome lytic or sclerotic lesions. T12 and L1 compression deformities are unchanged. Old sternal fracture. IMPRESSION: 1. Spiculated mass in the superior segment left lower lobe with associated mediastinal adenopathy, characterized as at least stage IIIA primary bronchogenic carcinoma on 07/22/2020. 2. Confluent area of parenchymal retraction and bronchiectasis in the right lower lobe, not  shown to be hypermetabolic on recent PET. Note is made of associated ground-glass along the peribronchovascular interstitium, inferior to this lesion. Adenocarcinoma cannot be excluded. Continued attention on follow-up exams is suggested. 3. Aortic atherosclerosis (ICD10-I70.0). Coronary artery calcification. 4. Enlarged pulmonary arteries, indicative of pulmonary arterial hypertension. Electronically Signed   By: Lorin Picket M.D.   On: 08/04/2020 13:47    ASSESSMENT & PLAN:   Cancer of lower lobe of left lung ([place]) # T2N3- Stage III-non-small cell lung cancer-favor adenocarcinoma.  I reviewed the pathology and stage with the patient and family in detail.  # Long discussion with the patient regarding the goal of treatment of stage III lung cancer-goal is cure; however only ~ 20% of the patients are cured.  Patient has unresectable disease.  #Discussed the role of concurrent chemoradiation [weekly carbotaxol with the daily radiation/Monday through Friday ~6 weeks].  Post chemoradiation-consolidation immunotherapy with durvalumab every 2 weeks is recommended based on data from [facility] clinical trial.   # Discussed the potential side effects including but not limited to-increasing fatigue, nausea vomiting, diarrhea, hair loss, sores in the mouth, increase risk of infection and also neuropathy.  I reviewed my concerns with the patient and family the risk of side effects/especially bone marrow toxicity given his age.   #History of A. fib on Eliquis.  Stable.  #  Chemotherapy education; port placement. Hopefully the planned start chemotherapy along with RT [in next ~ 2 weeks] Antiemetics-Zofran and Compazine; EMLA cream sent to pharmacy .  Thank you Dr.Johnston for allowing me to participate in the care of your pleasant patient. Please do not hesitate to contact me with questions or concerns in the interim.  # DISPOSITION; # labs today- cbc/cmp/CEA/B12/folic acid # chemo ed # port placement-  referral IR ASAP # follow up in 2  weeks- mid week- MD; labs- cbc/cmp; carbo-taxol- Dr.B  # I reviewed the blood work- with the patient in detail; also reviewed the imaging independently [as summarized above]; and with the patient in detail.    All questions were answered. The patient knows to call the clinic with any problems, questions or concerns.   [name], MD 08/18/2020 9:15 PM

## 2020-08-19 ENCOUNTER — Other Ambulatory Visit: Payer: Medicare Other

## 2020-08-19 ENCOUNTER — Ambulatory Visit
Admission: RE | Admit: 2020-08-19 | Discharge: 2020-08-19 | Disposition: A | Discharge status: Home / Self Care | Payer: Medicare Other | Source: Ambulatory Visit | Attending: Radiation Oncology | Admitting: Radiation Oncology

## 2020-08-19 ENCOUNTER — Other Ambulatory Visit: Payer: Self-pay

## 2020-08-19 ENCOUNTER — Telehealth: Payer: Self-pay | Admitting: *Deleted

## 2020-08-19 DIAGNOSIS — C3490 Malignant neoplasm of unspecified part of unspecified bronchus or lung: Secondary | ICD-10-CM | POA: Insufficient documentation

## 2020-08-19 LAB — CEA: CEA: 10.6 ng/mL — ABNORMAL HIGH (ref 0.0–4.7)

## 2020-08-19 MED ORDER — GADOBUTROL 1 MMOL/ML IV SOLN
7.5000 mL | Freq: Once | INTRAVENOUS | Status: AC | PRN
  Administered 2020-08-19: 7.5 mL via INTRAVENOUS

## 2020-08-19 NOTE — Telephone Encounter (Signed)
PA submitted for Zofran  [place] Key: [id number] - Rx #: [id number]

## 2020-08-19 NOTE — Progress Notes (Signed)
Clearance to hold eliquis prior to port placement faxed to Dr. Alveria Apley office.   [employer] [facility] testing submitted via online portal today.

## 2020-08-19 NOTE — Progress Notes (Signed)
  Oncology Nurse Navigator Documentation  Navigator Location: [facility]-Med Onc (08/19/20 0900)   )Navigator Encounter Type: Initial MedOnc;Initial RadOnc (08/19/20 0900)                         Barriers/Navigation Needs: Coordination of Care;Education (08/19/20 0900) Education: Newly Diagnosed Cancer Education;Understanding Cancer/ Treatment Options (08/19/20 0900) Interventions: Coordination of Care;Education;Referrals (08/19/20 0900) Referrals:  (port placement) (08/19/20 0900) Coordination of Care: Appts;Chemo;Radiology (08/19/20 0900) Education Method: Written;Verbal (08/19/20 0900)      Acuity: Level 2-Minimal Needs (1-2 Barriers Identified) (08/19/20 0900)      met with patient during new patient visit with Dr. Baruch Gouty and Dr. Rogue Bussing. All questions answered during visit. Reviewed upcoming appts. Pt given resources regarding diagnosis and supportive services available. Contact info given and instructed to call with any further questions or needs. Pt verbalized understanding.   Time Spent with Patient: 120 (08/19/20 0900)

## 2020-08-20 ENCOUNTER — Institutional Professional Consult (permissible substitution): Payer: Medicare Other | Admitting: Radiation Oncology

## 2020-08-20 ENCOUNTER — Ambulatory Visit: Payer: Medicare Other

## 2020-08-20 NOTE — Patient Instructions (Signed)
Paclitaxel injection What is this medicine? PACLITAXEL (PAK li TAX el) is a chemotherapy drug. It targets fast dividing cells, like cancer cells, and causes these cells to die. This medicine is used to treat ovarian cancer, breast cancer, lung cancer, Kaposi's sarcoma, and other cancers. This medicine may be used for other purposes; ask your health care provider or pharmacist if you have questions. COMMON BRAND NAME(S): Onxol, Taxol What should I tell my health care provider before I take this medicine? They need to know if you have any of these conditions:  history of irregular heartbeat  liver disease  low blood counts, like low white cell, platelet, or red cell counts  lung or breathing disease, like asthma  tingling of the fingers or toes, or other nerve disorder  an unusual or allergic reaction to paclitaxel, alcohol, polyoxyethylated castor oil, other chemotherapy, other medicines, foods, dyes, or preservatives  pregnant or trying to get pregnant  breast-feeding How should I use this medicine? This drug is given as an infusion into a vein. It is administered in a hospital or clinic by a specially trained health care professional. Talk to your pediatrician regarding the use of this medicine in children. Special care may be needed. Overdosage: If you think you have taken too much of this medicine contact a poison control center or emergency room at once. NOTE: This medicine is only for you. Do not share this medicine with others. What if I miss a dose? It is important not to miss your dose. Call your doctor or health care professional if you are unable to keep an appointment. What may interact with this medicine? Do not take this medicine with any of the following medications:  disulfiram  metronidazole This medicine may also interact with the following medications:  antiviral medicines for hepatitis, HIV or AIDS  certain antibiotics like erythromycin and  clarithromycin  certain medicines for fungal infections like ketoconazole and itraconazole  certain medicines for seizures like carbamazepine, phenobarbital, phenytoin  gemfibrozil  nefazodone  rifampin  St. John's wort This list may not describe all possible interactions. Give your health care provider a list of all the medicines, herbs, non-prescription drugs, or dietary supplements you use. Also tell them if you smoke, drink alcohol, or use illegal drugs. Some items may interact with your medicine. What should I watch for while using this medicine? Your condition will be monitored carefully while you are receiving this medicine. You will need important blood work done while you are taking this medicine. This medicine can cause serious allergic reactions. To reduce your risk you will need to take other medicine(s) before treatment with this medicine. If you experience allergic reactions like skin rash, itching or hives, swelling of the face, lips, or tongue, tell your doctor or health care professional right away. In some cases, you may be given additional medicines to help with side effects. Follow all directions for their use. This drug may make you feel generally unwell. This is not uncommon, as chemotherapy can affect healthy cells as well as cancer cells. Report any side effects. Continue your course of treatment even though you feel ill unless your doctor tells you to stop. Call your doctor or health care professional for advice if you get a fever, chills or sore throat, or other symptoms of a cold or flu. Do not treat yourself. This drug decreases your body's ability to fight infections. Try to avoid being around people who are sick. This medicine may increase your risk to bruise  or bleed. Call your doctor or health care professional if you notice any unusual bleeding. Be careful brushing and flossing your teeth or using a toothpick because you may get an infection or bleed more easily.  If you have any dental work done, tell your dentist you are receiving this medicine. Avoid taking products that contain aspirin, acetaminophen, ibuprofen, naproxen, or ketoprofen unless instructed by your doctor. These medicines may hide a fever. Do not become pregnant while taking this medicine. Women should inform their doctor if they wish to become pregnant or think they might be pregnant. There is a potential for serious side effects to an unborn child. Talk to your health care professional or pharmacist for more information. Do not breast-feed an infant while taking this medicine. Men are advised not to father a child while receiving this medicine. This product may contain alcohol. Ask your pharmacist or healthcare provider if this medicine contains alcohol. Be sure to tell all healthcare providers you are taking this medicine. Certain medicines, like metronidazole and disulfiram, can cause an unpleasant reaction when taken with alcohol. The reaction includes flushing, headache, nausea, vomiting, sweating, and increased thirst. The reaction can last from 30 minutes to several hours. What side effects may I notice from receiving this medicine? Side effects that you should report to your doctor or health care professional as soon as possible:  allergic reactions like skin rash, itching or hives, swelling of the face, lips, or tongue  breathing problems  changes in vision  fast, irregular heartbeat  high or low blood pressure  mouth sores  pain, tingling, numbness in the hands or feet  signs of decreased platelets or bleeding - bruising, pinpoint red spots on the skin, black, tarry stools, blood in the urine  signs of decreased red blood cells - unusually weak or tired, feeling faint or lightheaded, falls  signs of infection - fever or chills, cough, sore throat, pain or difficulty passing urine  signs and symptoms of liver injury like dark yellow or brown urine; general ill feeling or  flu-like symptoms; light-colored stools; loss of appetite; nausea; right upper belly pain; unusually weak or tired; yellowing of the eyes or skin  swelling of the ankles, feet, hands  unusually slow heartbeat Side effects that usually do not require medical attention (report to your doctor or health care professional if they continue or are bothersome):  diarrhea  hair loss  loss of appetite  muscle or joint pain  nausea, vomiting  pain, redness, or irritation at site where injected  tiredness This list may not describe all possible side effects. Call your doctor for medical advice about side effects. You may report side effects to FDA at 1-800-FDA-1088. Where should I keep my medicine? This drug is given in a hospital or clinic and will not be stored at home. NOTE: This sheet is a summary. It may not cover all possible information. If you have questions about this medicine, talk to your doctor, pharmacist, or health care provider.  2020 Elsevier/Gold Standard (2017-05-24 13:14:55) Carboplatin injection What is this medicine? CARBOPLATIN (KAR boe pla tin) is a chemotherapy drug. It targets fast dividing cells, like cancer cells, and causes these cells to die. This medicine is used to treat ovarian cancer and many other cancers. This medicine may be used for other purposes; ask your health care provider or pharmacist if you have questions. COMMON BRAND NAME(S): Paraplatin What should I tell my health care provider before I take this medicine? They need to  know if you have any of these conditions:  blood disorders  hearing problems  kidney disease  recent or ongoing radiation therapy  an unusual or allergic reaction to carboplatin, cisplatin, other chemotherapy, other medicines, foods, dyes, or preservatives  pregnant or trying to get pregnant  breast-feeding How should I use this medicine? This drug is usually given as an infusion into a vein. It is administered in a  hospital or clinic by a specially trained health care professional. Talk to your pediatrician regarding the use of this medicine in children. Special care may be needed. Overdosage: If you think you have taken too much of this medicine contact a poison control center or emergency room at once. NOTE: This medicine is only for you. Do not share this medicine with others. What if I miss a dose? It is important not to miss a dose. Call your doctor or health care professional if you are unable to keep an appointment. What may interact with this medicine?  medicines for seizures  medicines to increase blood counts like filgrastim, pegfilgrastim, sargramostim  some antibiotics like amikacin, gentamicin, neomycin, streptomycin, tobramycin  vaccines Talk to your doctor or health care professional before taking any of these medicines:  acetaminophen  aspirin  ibuprofen  ketoprofen  naproxen This list may not describe all possible interactions. Give your health care provider a list of all the medicines, herbs, non-prescription drugs, or dietary supplements you use. Also tell them if you smoke, drink alcohol, or use illegal drugs. Some items may interact with your medicine. What should I watch for while using this medicine? Your condition will be monitored carefully while you are receiving this medicine. You will need important blood work done while you are taking this medicine. This drug may make you feel generally unwell. This is not uncommon, as chemotherapy can affect healthy cells as well as cancer cells. Report any side effects. Continue your course of treatment even though you feel ill unless your doctor tells you to stop. In some cases, you may be given additional medicines to help with side effects. Follow all directions for their use. Call your doctor or health care professional for advice if you get a fever, chills or sore throat, or other symptoms of a cold or flu. Do not treat  yourself. This drug decreases your body's ability to fight infections. Try to avoid being around people who are sick. This medicine may increase your risk to bruise or bleed. Call your doctor or health care professional if you notice any unusual bleeding. Be careful brushing and flossing your teeth or using a toothpick because you may get an infection or bleed more easily. If you have any dental work done, tell your dentist you are receiving this medicine. Avoid taking products that contain aspirin, acetaminophen, ibuprofen, naproxen, or ketoprofen unless instructed by your doctor. These medicines may hide a fever. Do not become pregnant while taking this medicine. Women should inform their doctor if they wish to become pregnant or think they might be pregnant. There is a potential for serious side effects to an unborn child. Talk to your health care professional or pharmacist for more information. Do not breast-feed an infant while taking this medicine. What side effects may I notice from receiving this medicine? Side effects that you should report to your doctor or health care professional as soon as possible:  allergic reactions like skin rash, itching or hives, swelling of the face, lips, or tongue  signs of infection - fever or  chills, cough, sore throat, pain or difficulty passing urine  signs of decreased platelets or bleeding - bruising, pinpoint red spots on the skin, black, tarry stools, nosebleeds  signs of decreased red blood cells - unusually weak or tired, fainting spells, lightheadedness  breathing problems  changes in hearing  changes in vision  chest pain  high blood pressure  low blood counts - This drug may decrease the number of white blood cells, red blood cells and platelets. You may be at increased risk for infections and bleeding.  nausea and vomiting  pain, swelling, redness or irritation at the injection site  pain, tingling, numbness in the hands or  feet  problems with balance, talking, walking  trouble passing urine or change in the amount of urine Side effects that usually do not require medical attention (report to your doctor or health care professional if they continue or are bothersome):  hair loss  loss of appetite  metallic taste in the mouth or changes in taste This list may not describe all possible side effects. Call your doctor for medical advice about side effects. You may report side effects to FDA at 1-800-FDA-1088. Where should I keep my medicine? This drug is given in a hospital or clinic and will not be stored at home. NOTE: This sheet is a summary. It may not cover all possible information. If you have questions about this medicine, talk to your doctor, pharmacist, or health care provider.  2020 Elsevier/Gold Standard (2007-12-26 14:38:05)

## 2020-08-21 ENCOUNTER — Ambulatory Visit
Admission: RE | Admit: 2020-08-21 | Discharge: 2020-08-21 | Disposition: A | Discharge status: Home / Self Care | Payer: Medicare Other | Source: Ambulatory Visit | Attending: Radiation Oncology | Admitting: Radiation Oncology

## 2020-08-21 ENCOUNTER — Inpatient Hospital Stay: Payer: Medicare Other

## 2020-08-21 ENCOUNTER — Other Ambulatory Visit: Payer: Medicare Other

## 2020-08-21 ENCOUNTER — Encounter: Payer: Self-pay | Admitting: *Deleted

## 2020-08-21 DIAGNOSIS — C3432 Malignant neoplasm of lower lobe, left bronchus or lung: Secondary | ICD-10-CM | POA: Diagnosis present

## 2020-08-21 DIAGNOSIS — Z510 Encounter for antineoplastic radiation therapy: Secondary | ICD-10-CM | POA: Insufficient documentation

## 2020-08-21 NOTE — Progress Notes (Signed)
°  Oncology Nurse Navigator Documentation  Navigator Location: [facility] (08/21/20 1500)   )Navigator Encounter Type: Lobby (08/21/20 1500)                     Patient Visit Type: RadOnc (08/21/20 1500) Treatment Phase: CT SIM (08/21/20 1500) Barriers/Navigation Needs: No Barriers At This Time;No Needs;No Questions (08/21/20 1500)   Interventions: None Required (08/21/20 1500)           follow up visit made with patient during CT simulation. All questions answered during visit. Pt given MRI results and informed that all take home-meds on treatment plan have been sent into his pharmacy. Informed still waiting on clearance from cardiology to hold eliquis prior to port placement being scheduled. Nothing further needed at this time. Instructed to call with any further questions or needs. Pt verbalized understanding.            Time Spent with Patient: 30 (08/21/20 1500)

## 2020-08-22 ENCOUNTER — Telehealth: Payer: Self-pay | Admitting: *Deleted

## 2020-08-22 MED ORDER — LIDOCAINE-PRILOCAINE 2.5-2.5 % EX CREA: 1.0000 "application " | TOPICAL_CREAM | CUTANEOUS | 1 refills | Status: DC | PRN

## 2020-08-22 NOTE — Telephone Encounter (Signed)
Port placement scheduled for Wednesday 11/24/ with arrival of 830 am. Patient instructed to not eat or drink anything 6-8 hours prior to the apt. He will also bring a driver to this apt. He was instructed to hold his Eliquis 48 hours prior to the biopsy (last dose to be taken on Sunday). He gave verbal understanding of the plan of care.

## 2020-08-22 NOTE — Telephone Encounter (Signed)
[name] (Key: [id number]) - [id number] Lidocaine-Prilocaine 2.5-2.5% cream     Status: PA Response - Approved  Created: November 16th, 2021 [id number]  Sent: November 19th, 2021  Open  Archive     JORON VELIS (Key: [place]) [id number] Ondansetron HCl 8MG  tablets     Status: PA Response - Approved  Created: November 15th, 2021 [id number]  Sent: November 16th, 2021

## 2020-08-22 NOTE — Progress Notes (Signed)
Patient on schedule for [facility] 08/27/2020, called and spoke with patient on phone with pre procedure instructions given,made aware to be here @ 0830, NPO after [place] as well as driver for discharge post procedure/discharge,.stated understanding.

## 2020-08-22 NOTE — Progress Notes (Signed)
Patient is aware and holding Eliquis 08/24/2020 as LD.

## 2020-08-22 NOTE — Telephone Encounter (Signed)
EMLA Cream is on backorder and needs a replacemet medicine called in.   I called and discussed with patient about checking with another pharmacy to see if they have it in stock and he said to try [facility] and if they do not have it to call [facility] on [address] and [place] does not have it in stock and it would be after Thanksgiving before they can get it.  [facility] on [address] at [place] has it in [place].  New prescription sent to [facility] and patient informed

## 2020-08-22 NOTE — Telephone Encounter (Signed)
Foundation one request - faxed to [place] [facility]

## 2020-08-22 NOTE — Telephone Encounter (Signed)
PA submitted for EMLA cream  [name] Key: [id number] - Rx #: [id number]

## 2020-08-22 NOTE — Telephone Encounter (Signed)
-----   Message from [place] sent at 08/20/2020  4:10 PM EST ----- Regarding: [place] Team,   Scanned to media tab for review.  Thanks, [employer]

## 2020-08-25 ENCOUNTER — Other Ambulatory Visit: Payer: Self-pay | Admitting: Radiology

## 2020-08-26 ENCOUNTER — Other Ambulatory Visit: Payer: Self-pay | Admitting: Radiology

## 2020-08-26 DIAGNOSIS — Z510 Encounter for antineoplastic radiation therapy: Secondary | ICD-10-CM | POA: Diagnosis not present

## 2020-08-27 ENCOUNTER — Ambulatory Visit
Admission: RE | Admit: 2020-08-27 | Discharge: 2020-08-27 | Disposition: A | Discharge status: Home / Self Care | Payer: Medicare Other | Source: Ambulatory Visit | Attending: Internal Medicine | Admitting: Internal Medicine

## 2020-08-27 ENCOUNTER — Other Ambulatory Visit: Payer: Self-pay

## 2020-08-27 DIAGNOSIS — Z7901 Long term (current) use of anticoagulants: Secondary | ICD-10-CM | POA: Diagnosis not present

## 2020-08-27 DIAGNOSIS — Z79899 Other long term (current) drug therapy: Secondary | ICD-10-CM | POA: Diagnosis not present

## 2020-08-27 DIAGNOSIS — C3492 Malignant neoplasm of unspecified part of left bronchus or lung: Secondary | ICD-10-CM | POA: Insufficient documentation

## 2020-08-27 DIAGNOSIS — Z87891 Personal history of nicotine dependence: Secondary | ICD-10-CM | POA: Diagnosis not present

## 2020-08-27 DIAGNOSIS — C3432 Malignant neoplasm of lower lobe, left bronchus or lung: Secondary | ICD-10-CM

## 2020-08-27 HISTORY — PX: IR IMAGING GUIDED PORT INSERTION: IMG5740

## 2020-08-27 LAB — BASIC METABOLIC PANEL
Anion gap: 11 (ref 5–15)
BUN: 22 mg/dL (ref 8–23)
CO2: 27 mmol/L (ref 22–32)
Calcium: 9.5 mg/dL (ref 8.9–10.3)
Chloride: 100 mmol/L (ref 98–111)
Creatinine, Ser: 1.16 mg/dL (ref 0.61–1.24)
GFR, Estimated: >60 mL/min (ref >60)
Glucose, Bld: 104 mg/dL — ABNORMAL HIGH (ref 70–99)
Potassium: 4.5 mmol/L (ref 3.5–5.1)
Sodium: 138 mmol/L (ref 135–145)

## 2020-08-27 LAB — CBC WITH DIFFERENTIAL/PLATELET
Abs Immature Granulocytes: 0.03 10*3/uL (ref 0.00–0.07)
Basophils Absolute: 0 10*3/uL (ref 0.0–0.1)
Basophils Relative: 0 %
Eosinophils Absolute: 0.1 10*3/uL (ref 0.0–0.5)
Eosinophils Relative: 1 %
HCT: 47.7 % (ref 39.0–52.0)
Hemoglobin: 15.9 g/dL (ref 13.0–17.0)
Immature Granulocytes: 0 %
Lymphocytes Relative: 39 %
Lymphs Abs: 3.2 10*3/uL (ref 0.7–4.0)
MCH: 32.2 pg (ref 26.0–34.0)
MCHC: 33.3 g/dL (ref 30.0–36.0)
MCV: 96.6 fL (ref 80.0–100.0)
Monocytes Absolute: 0.7 10*3/uL (ref 0.1–1.0)
Monocytes Relative: 8 %
Neutro Abs: 4.2 10*3/uL (ref 1.7–7.7)
Neutrophils Relative %: 52 %
Platelets: 283 10*3/uL (ref 150–400)
RBC: 4.94 MIL/uL (ref 4.22–5.81)
RDW: 12.6 % (ref 11.5–15.5)
WBC: 8.2 10*3/uL (ref 4.0–10.5)
nRBC: 0 % (ref 0.0–0.2)

## 2020-08-27 MED ORDER — FENTANYL CITRATE (PF) 100 MCG/2ML IJ SOLN
INTRAMUSCULAR | Status: AC
  Filled 2020-08-27: qty 2

## 2020-08-27 MED ORDER — FENTANYL CITRATE (PF) 100 MCG/2ML IJ SOLN
INTRAMUSCULAR | Status: AC | PRN
Start: 2020-08-27 — End: 2020-08-27
  Administered 2020-08-27: 25 ug via INTRAVENOUS

## 2020-08-27 MED ORDER — CEFAZOLIN SODIUM-DEXTROSE 2-4 GM/100ML-% IV SOLN
2.0000 g | INTRAVENOUS | Status: AC
  Administered 2020-08-27: 2 g via INTRAVENOUS

## 2020-08-27 MED ORDER — MIDAZOLAM HCL 2 MG/2ML IJ SOLN
INTRAMUSCULAR | Status: AC | PRN
  Administered 2020-08-27: 1 mg via INTRAVENOUS

## 2020-08-27 MED ORDER — HEPARIN SOD (PORK) LOCK FLUSH 100 UNIT/ML IV SOLN
INTRAVENOUS | Status: AC
  Filled 2020-08-27: qty 5

## 2020-08-27 MED ORDER — SODIUM CHLORIDE 0.9 % IV SOLN: INTRAVENOUS | Status: DC

## 2020-08-27 MED ORDER — MIDAZOLAM HCL 5 MG/5ML IJ SOLN
INTRAMUSCULAR | Status: AC
  Filled 2020-08-27: qty 5

## 2020-08-27 NOTE — Progress Notes (Signed)
Reviewed procedure, port with patient. Verbalizes understanding.  Wife:  Pamala Hurry called updated, verbalizes understanding. Patient tolerated juice while in post-op, does not want a sandwich, "will eat at home" No c/o's upon discharge

## 2020-08-27 NOTE — Discharge Instructions (Signed)
Implanted Port Home Guide  An implanted port is a type of central line that is placed under the skin. Central lines are used to provide IV access when treatment or nutrition needs to be given through a person's veins. Implanted ports are used for long-term IV access. An implanted port may be placed because: You need IV medicine that would be irritating to the small veins in your hands or arms. You need long-term IV medicines, such as antibiotics. You need IV nutrition for a long period. You need frequent blood draws for lab tests. You need dialysis.   Implanted ports are usually placed in the chest area, but they can also be placed in the upper arm, the abdomen, or the leg. An implanted port has two main parts: Reservoir. The reservoir is round and will appear as a small, raised area under your skin. The reservoir is the part where a needle is inserted to give medicines or draw blood. Catheter. The catheter is a thin, flexible tube that extends from the reservoir. The catheter is placed into a large vein. Medicine that is inserted into the reservoir goes into the catheter and then into the vein.   How will I care for my incision  You may shower tomorrow  How is my port accessed? Special steps must be taken to access the port: Before the port is accessed, a numbing cream can be placed on the skin. This helps numb the skin over the port site. Your health care provider uses a sterile technique to access the port. Your health care provider must put on a mask and sterile gloves. The skin over your port is cleaned carefully with an antiseptic and allowed to dry. The port is gently pinched between sterile gloves, and a needle is inserted into the port. Only "non-coring" port needles should be used to access the port. Once the port is accessed, a blood return should be checked. This helps ensure that the port is in the vein and is not clogged. If your port needs to remain accessed for a constant  infusion, a clear (transparent) bandage will be placed over the needle site. The bandage and needle will need to be changed every week, or as directed by your health care provider.   What is flushing? Flushing helps keep the port from getting clogged. Follow your health care provider's instructions on how and when to flush the port. Ports are usually flushed with saline solution or a medicine called heparin. The need for flushing will depend on how the port is used. If the port is used for intermittent medicines or blood draws, the port will need to be flushed: After medicines have been given. After blood has been drawn. As part of routine maintenance. If a constant infusion is running, the port may not need to be flushed.   How long will my port stay implanted? The port can stay in for as long as your health care provider thinks it is needed. When it is time for the port to come out, surgery will be done to remove it. The procedure is similar to the one performed when the port was put in. When should I seek immediate medical care? When you have an implanted port, you should seek immediate medical care if: You notice a bad smell coming from the incision site. You have swelling, redness, or drainage at the incision site. You have more swelling or pain at the port site or the surrounding area. You have a fever that   is not controlled with medicine.   This information is not intended to replace advice given to you by your health care provider. Make sure you discuss any questions you have with your health care provider. Document Released: 09/20/2005 Document Revised: 02/26/2016 Document Reviewed: 05/28/2013 Elsevier Interactive Patient Education  2017 Elsevier Inc.    

## 2020-08-27 NOTE — H&P (Signed)
Chief Complaint: Patient was seen in consultation today for need for venous access at the request of Brahmanday,Govinda R  Referring Physician(s): Brahmanday,Govinda R  Patient Status: [facility] - Out-pt  History of Present Illness: Jason Warren is a 86 y.o. male with a history of stage 3 non small cell lung cancer of the left lower lobe.  He is not a surgical candidate and requires chemotherapy.  To facilitate this, he requires durable venous access and presents today for portacatheter placement.  He is in his usual state of health this morning and has no new or acute symptoms.   Past Medical History:  Diagnosis Date  . Arthritis   . Dysrhythmia    A-fib  . Hip pain    left  . HOH (hard of hearing)   . LBBB (left bundle branch block) 08/05/2020  . Skin cancer, basal cell     face top of head    Past Surgical History:  Procedure Laterality Date  . CATARACT EXTRACTION W/PHACO Right 07/13/2017   Procedure: CATARACT EXTRACTION PHACO AND INTRAOCULAR LENS PLACEMENT ([place]);  Surgeon: Leandrew Koyanagi, MD;  Location: [place];  Service: Ophthalmology;  Laterality: Right;  IVA TOPICAL RIGHT  . CATARACT EXTRACTION W/PHACO Left 01/04/2018   Procedure: CATARACT EXTRACTION PHACO AND INTRAOCULAR LENS PLACEMENT ([place]) LEFT;  Surgeon: Leandrew Koyanagi, MD;  Location: [place];  Service: Ophthalmology;  Laterality: Left;  . COLONOSCOPY    . JOINT REPLACEMENT     Left total hip Dr. Su Hoff 08-04-18  . ROTATOR CUFF REPAIR Right   . SKIN CANCER EXCISION     face  . TOTAL HIP ARTHROPLASTY Left 08/04/2018   Procedure: TOTAL HIP ARTHROPLASTY ANTERIOR APPROACH;  Surgeon: Frederik Pear, MD;  Location: [facility];  Service: Orthopedics;  Laterality: Left;  Marland Kitchen VIDEO BRONCHOSCOPY WITH ENDOBRONCHIAL NAVIGATION N/A 08/07/2020   Procedure: VIDEO BRONCHOSCOPY WITH ENDOBRONCHIAL NAVIGATION;  Surgeon: Ottie Glazier, MD;  Location: [facility];  Service: Thoracic;  Laterality: N/A;  .  VIDEO BRONCHOSCOPY WITH ENDOBRONCHIAL ULTRASOUND N/A 08/07/2020   Procedure: VIDEO BRONCHOSCOPY WITH ENDOBRONCHIAL ULTRASOUND;  Surgeon: Ottie Glazier, MD;  Location: [facility];  Service: Thoracic;  Laterality: N/A;    Allergies: Patient has no known allergies.  Medications: Prior to Admission medications   Medication Sig Start Date End Date Taking? Authorizing Provider  acetaminophen (TYLENOL) 500 MG tablet Take 500 mg by mouth every 8 (eight) hours as needed for moderate pain.    Yes [provider]  hydroxypropyl methylcellulose / hypromellose (ISOPTO TEARS / GONIOVISC) 2.5 % ophthalmic solution Place 1 drop into the left eye daily as needed for dry eyes.   Yes [provider]  Multiple Vitamin (MULTIVITAMIN) tablet Take 1 tablet by mouth daily.   Yes [provider]  pravastatin (PRAVACHOL) 80 MG tablet Take 80 mg by mouth at bedtime.    Yes [provider]  saw palmetto 160 MG capsule Take 160 mg by mouth 2 (two) times daily.   Yes [provider]  apixaban (ELIQUIS) 5 MG TABS tablet Take 5 mg by mouth 2 (two) times daily.    [provider]  lidocaine-prilocaine (EMLA) cream Apply 1 application topically as needed. Apply small amount to port site at least 1 hour prior to it being accessed, cover with plastic wrap 08/22/20   [name], MD  ondansetron (ZOFRAN) 8 MG tablet One pill every 8 hours as needed for nausea/vomitting. 08/18/20   [name], MD  Polyethyl Glycol-Propyl Glycol (  SYSTANE FREE OP) Apply to eye.    [provider]  prochlorperazine (COMPAZINE) 10 MG tablet Take 1 tablet (10 mg total) by mouth every 6 (six) hours as needed for nausea or vomiting. 08/18/20   [name], MD     Family History  Problem Relation Age of Onset  . Throat cancer Brother         & lung cancer    Social History   Socioeconomic History  . Marital status: Married    Spouse name: Not on file  .  Number of children: Not on file  . Years of education: Not on file  . Highest education level: Not on file  Occupational History  . Not on file  Tobacco Use  . Smoking status: Former Smoker    Packs/day: 1.00    Years: 4.00    Pack years: 4.00    Types: Cigarettes  . Smokeless tobacco: Never Used  . Tobacco comment: quit early 70's  Vaping Use  . Vaping Use: Never used  Substance and Sexual Activity  . Alcohol use: No  . Drug use: No  . Sexual activity: Not Currently  Other Topics Concern  . Not on file  Social History Narrative   > quit 35 years; smoked for 15 years. Rare alcohol. 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    Social Determinants of Health   Financial Resource Strain:   . Difficulty of Paying Living Expenses: Not on file  Food Insecurity:   . Worried About Charity fundraiser in the Last Year: Not on file  . Ran Out of Food in the Last Year: Not on file  Transportation Needs:   . Lack of Transportation (Medical): Not on file  . Lack of Transportation (Non-Medical): Not on file  Physical Activity:   . Days of Exercise per Week: Not on file  . Minutes of Exercise per Session: Not on file  Stress:   . Feeling of Stress : Not on file  Social Connections:   . Frequency of Communication with Friends and Family: Not on file  . Frequency of Social Gatherings with Friends and Family: Not on file  . Attends Religious Services: Not on file  . Active Member of Clubs or Organizations: Not on file  . Attends Archivist Meetings: Not on file  . Marital Status: Not on file    ECOG Status: 1 - Symptomatic but completely ambulatory  Review of Systems: A 12 point ROS discussed and pertinent positives are indicated in the HPI above.  All other systems are negative.  Review of Systems  Vital Signs: BP (!) 152/79   Pulse 73   Temp 98.3 F (36.8 C) (Oral)   Resp 17   Ht 6' (1.829 m)   Wt  72.6 kg   SpO2 97%   BMI 21.70 kg/m   Physical Exam Constitutional:      Appearance: Normal appearance.  HENT:     Head: Normocephalic and atraumatic.     Mouth/Throat:     Mouth: Mucous membranes are moist.  Eyes:     General: No scleral icterus. Cardiovascular:     Rate and Rhythm: Normal rate and regular rhythm.  Pulmonary:     Effort: Pulmonary effort is normal.     Breath sounds: Normal breath sounds.  Skin:    General: Skin is warm and dry.  Neurological:  Mental Status: He is alert and oriented to person, place, and time.  Psychiatric:        Mood and Affect: Mood normal.        Behavior: Behavior normal.     Imaging: MR Brain W Wo Contrast  Result Date: 08/19/2020 CLINICAL DATA:  Lung cancer, staging EXAM: MRI HEAD WITHOUT AND WITH CONTRAST TECHNIQUE: Multiplanar, multiecho pulse sequences of the brain and surrounding structures were obtained without and with intravenous contrast. CONTRAST:  7.5 mL Gadavist COMPARISON:  None. FINDINGS: Brain: There is no acute infarction or intracranial hemorrhage. There is no intracranial mass, mass effect, or edema. There is no hydrocephalus or extra-axial fluid collection. Prominence of the ventricles and sulci reflects mild generalized parenchymal volume loss. Patchy areas of T2 hyperintensity in the supratentorial white matter are nonspecific but may reflect mild to moderate microvascular ischemic changes. No abnormal enhancement. Vascular: Major vessel flow voids at the skull base are preserved. Skull and upper cervical spine: Normal marrow signal is preserved. Sinuses/Orbits: Paranasal sinuses are aerated. Orbits are unremarkable. Other: Sella is unremarkable.  Mastoid air cells are clear. IMPRESSION: No evidence of intracranial metastatic disease. Electronically Signed   By: Macy Mis M.D.   On: 08/19/2020 15:22   DG Chest Port 1 View  Result Date: 08/07/2020 CLINICAL DATA:  Status post bronchoscopy. EXAM: PORTABLE CHEST 1  VIEW COMPARISON:  August 04, 2020. FINDINGS: Left lower lobe mass is noted as described on prior CT scan. No pneumothorax or pleural effusion is noted. Right infrahilar opacity is noted concerning for scarring or bronchiectasis. Bony thorax is unremarkable. IMPRESSION: Left lower lobe mass is noted as described on prior CT scan. Right infrahilar opacity is noted concerning for scarring or bronchiectasis. No pneumothorax is noted. Aortic Atherosclerosis (ICD10-I70.0). Electronically Signed   By: Marijo Conception M.D.   On: 08/07/2020 10:22   DG C-Arm 1-60 Min-No Report  Result Date: 08/07/2020 Fluoroscopy was utilized by the requesting physician.  No radiographic interpretation.   CT Super D Chest Wo Contrast  Result Date: 08/04/2020 CLINICAL DATA:  Bronchoscopy scheduled 08/07/2020. Mediastinal pain, mid chest pain. EXAM: CT CHEST WITHOUT CONTRAST TECHNIQUE: Multidetector CT imaging of the chest was performed using thin slice collimation for electromagnetic bronchoscopy planning purposes, without intravenous contrast. COMPARISON:  PET 07/22/2020 and CT chest 07/17/2020. FINDINGS: Cardiovascular: Atherosclerotic calcification of the aorta, aortic valve and coronary arteries. Right and left pulmonary arteries and heart are enlarged. No pericardial effusion. Mediastinum/Nodes: Low left paratracheal lymph node measures 1.6 cm, stable. There are additional subcentimeter short axis mediastinal lymph nodes. Subcarinal lymph node measures 1.5 cm, as before. Hilar regions are difficult to evaluate without IV contrast. No axillary adenopathy. Esophagus is grossly unremarkable. Lungs/Pleura: A spiculated mass in the superior segment left lower lobe measures 3.3 x 3.7 cm. There is a short border of contact, and extensions to, the adjacent visceral pleural, with pleural thickening. An area of irregular soft tissue, architectural distortion and bronchiectasis in the superior segment right lower lobe measures 3.5 x 3.7 cm.  Inferiorly, there is some associated ground-glass along the peribronchovascular interstitium. No pleural fluid. Airway is unremarkable. Upper Abdomen: 1.5 cm low-attenuation lesion in the dome of the liver is unchanged but too small to characterize. Visualized portions of the liver, gallbladder and adrenal glands are otherwise unremarkable. Low-attenuation lesions in the kidneys measure up to 2.7 cm on the right, the largest of which is likely a cyst. Others are too small to characterize. Visualized portions of the kidneys,  spleen, pancreas, stomach and bowel are otherwise grossly unremarkable. Musculoskeletal: Degenerative changes in the spine. No worrisome lytic or sclerotic lesions. T12 and L1 compression deformities are unchanged. Old sternal fracture. IMPRESSION: 1. Spiculated mass in the superior segment left lower lobe with associated mediastinal adenopathy, characterized as at least stage IIIA primary bronchogenic carcinoma on 07/22/2020. 2. Confluent area of parenchymal retraction and bronchiectasis in the right lower lobe, not shown to be hypermetabolic on recent PET. Note is made of associated ground-glass along the peribronchovascular interstitium, inferior to this lesion. Adenocarcinoma cannot be excluded. Continued attention on follow-up exams is suggested. 3. Aortic atherosclerosis (ICD10-I70.0). Coronary artery calcification. 4. Enlarged pulmonary arteries, indicative of pulmonary arterial hypertension. Electronically Signed   By: Lorin Picket M.D.   On: 08/04/2020 13:47    Labs:  CBC: Recent Labs    08/18/20 1543 08/27/20 0857  WBC 7.4 8.2  HGB 14.8 15.9  HCT 43.2 47.7  PLT 255 283    COAGS: Recent Labs    08/05/20 0834  INR 1.1  APTT 33    BMP: Recent Labs    08/18/20 1543 08/27/20 0857  NA 137 138  K 4.3 4.5  CL 100 100  CO2 28 27  GLUCOSE 100* 104*  BUN 17 22  CALCIUM 9.0 9.5  CREATININE 1.06 1.16  GFRNONAA >60 >60    LIVER FUNCTION TESTS: Recent Labs      08/18/20 1543  BILITOT 0.8  AST 25  ALT 20  ALKPHOS 104  PROT 7.7  ALBUMIN 4.1    TUMOR MARKERS: No results for input(s): AFPTM, CEA, CA199, CHROMGRNA in the last 8760 hours.  Assessment and Plan:  Left lower lobe non small cell lung cancer, ready to begin chemotherapy but requires stable and reliable venous access.   1.) Proceed with portacatheter placement.   Risks and benefits of image guided port-a-catheter placement was discussed with the patient including, but not limited to bleeding, infection, pneumothorax, or fibrin sheath development and need for additional procedures.  All of the patient's questions were answered, patient is agreeable to proceed. Consent signed and in chart.    Thank you for this interesting consult.  I greatly enjoyed meeting Amarius Toto and look forward to participating in their care.  A copy of this report was sent to the requesting provider on this date.  Electronically Signed: Criselda Peaches, MD 08/27/2020, 9:29 AM   I spent a total of 15 Minutes  in face to face in clinical consultation, greater than 50% of which was counseling/coordinating care for non small cell lung cancer of the left lower lobe.

## 2020-08-27 NOTE — Procedures (Signed)
Interventional Radiology Procedure Note  Procedure: Placement of a right IJ approach single lumen PowerPort.  Tip is positioned at the superior cavoatrial junction and catheter is ready for immediate use.   Complications: No immediate  EBL: None  Recommendations:  - Ok to shower tomorrow - Do not submerge for 7 days - Routine line care    Signed,  Josie Burleigh K. Tyrique Sporn, MD   

## 2020-09-01 ENCOUNTER — Other Ambulatory Visit: Payer: Self-pay

## 2020-09-01 DIAGNOSIS — C3490 Malignant neoplasm of unspecified part of unspecified bronchus or lung: Secondary | ICD-10-CM

## 2020-09-02 ENCOUNTER — Ambulatory Visit: Admission: RE | Admit: 2020-09-02 | Payer: Medicare Other | Source: Ambulatory Visit

## 2020-09-02 ENCOUNTER — Encounter: Payer: Self-pay | Admitting: Internal Medicine

## 2020-09-02 ENCOUNTER — Encounter: Payer: Self-pay | Admitting: *Deleted

## 2020-09-02 ENCOUNTER — Inpatient Hospital Stay: Payer: Medicare Other

## 2020-09-02 ENCOUNTER — Other Ambulatory Visit: Payer: Self-pay

## 2020-09-02 ENCOUNTER — Inpatient Hospital Stay (HOSPITAL_BASED_OUTPATIENT_CLINIC_OR_DEPARTMENT_OTHER): Payer: Medicare Other | Admitting: Internal Medicine

## 2020-09-02 VITALS — BP 148/80 | HR 82 | Temp 96.1°F

## 2020-09-02 DIAGNOSIS — C3432 Malignant neoplasm of lower lobe, left bronchus or lung: Secondary | ICD-10-CM

## 2020-09-02 DIAGNOSIS — C3490 Malignant neoplasm of unspecified part of unspecified bronchus or lung: Secondary | ICD-10-CM

## 2020-09-02 DIAGNOSIS — Z5111 Encounter for antineoplastic chemotherapy: Secondary | ICD-10-CM | POA: Diagnosis not present

## 2020-09-02 LAB — COMPREHENSIVE METABOLIC PANEL
ALT: 18 U/L (ref 0–44)
AST: 26 U/L (ref 15–41)
Albumin: 4 g/dL (ref 3.5–5.0)
Alkaline Phosphatase: 106 U/L (ref 38–126)
Anion gap: 10 (ref 5–15)
BUN: 24 mg/dL — ABNORMAL HIGH (ref 8–23)
CO2: 24 mmol/L (ref 22–32)
Calcium: 9 mg/dL (ref 8.9–10.3)
Chloride: 102 mmol/L (ref 98–111)
Creatinine, Ser: 1.06 mg/dL (ref 0.61–1.24)
GFR, Estimated: >60 mL/min (ref >60)
Glucose, Bld: 145 mg/dL — ABNORMAL HIGH (ref 70–99)
Potassium: 4.1 mmol/L (ref 3.5–5.1)
Sodium: 136 mmol/L (ref 135–145)
Total Bilirubin: 0.8 mg/dL (ref 0.3–1.2)
Total Protein: 7.4 g/dL (ref 6.5–8.1)

## 2020-09-02 LAB — CBC WITH DIFFERENTIAL/PLATELET
Abs Immature Granulocytes: 0.02 10*3/uL (ref 0.00–0.07)
Basophils Absolute: 0 10*3/uL (ref 0.0–0.1)
Basophils Relative: 0 %
Eosinophils Absolute: 0.1 10*3/uL (ref 0.0–0.5)
Eosinophils Relative: 2 %
HCT: 42.1 % (ref 39.0–52.0)
Hemoglobin: 14.2 g/dL (ref 13.0–17.0)
Immature Granulocytes: 0 %
Lymphocytes Relative: 29 %
Lymphs Abs: 1.9 10*3/uL (ref 0.7–4.0)
MCH: 32.2 pg (ref 26.0–34.0)
MCHC: 33.7 g/dL (ref 30.0–36.0)
MCV: 95.5 fL (ref 80.0–100.0)
Monocytes Absolute: 0.5 10*3/uL (ref 0.1–1.0)
Monocytes Relative: 8 %
Neutro Abs: 4 10*3/uL (ref 1.7–7.7)
Neutrophils Relative %: 61 %
Platelets: 274 10*3/uL (ref 150–400)
RBC: 4.41 MIL/uL (ref 4.22–5.81)
RDW: 12.6 % (ref 11.5–15.5)
WBC: 6.7 10*3/uL (ref 4.0–10.5)
nRBC: 0 % (ref 0.0–0.2)

## 2020-09-02 MED ORDER — HEPARIN SOD (PORK) LOCK FLUSH 100 UNIT/ML IV SOLN
500.0000 [IU] | Freq: Once | INTRAVENOUS | Status: AC | PRN
  Administered 2020-09-02: 500 [IU]
  Filled 2020-09-02: qty 5

## 2020-09-02 MED ORDER — SODIUM CHLORIDE 0.9 % IV SOLN
45.0000 mg/m2 | Freq: Once | INTRAVENOUS | Status: AC
  Administered 2020-09-02: 90 mg via INTRAVENOUS
  Filled 2020-09-02: qty 15

## 2020-09-02 MED ORDER — SODIUM CHLORIDE 0.9 % IV SOLN
Freq: Once | INTRAVENOUS | Status: AC
  Filled 2020-09-02: qty 250

## 2020-09-02 MED ORDER — HEPARIN SOD (PORK) LOCK FLUSH 100 UNIT/ML IV SOLN
INTRAVENOUS | Status: AC
  Filled 2020-09-02: qty 5

## 2020-09-02 MED ORDER — SODIUM CHLORIDE 0.9 % IV SOLN
20.0000 mg | Freq: Once | INTRAVENOUS | Status: AC
  Administered 2020-09-02: 20 mg via INTRAVENOUS
  Filled 2020-09-02: qty 20

## 2020-09-02 MED ORDER — FAMOTIDINE IN NACL 20-0.9 MG/50ML-% IV SOLN
20.0000 mg | Freq: Once | INTRAVENOUS | Status: AC
  Administered 2020-09-02: 20 mg via INTRAVENOUS
  Filled 2020-09-02: qty 50

## 2020-09-02 MED ORDER — SODIUM CHLORIDE 0.9 % IV SOLN
149.4000 mg | Freq: Once | INTRAVENOUS | Status: AC
  Administered 2020-09-02: 150 mg via INTRAVENOUS
  Filled 2020-09-02: qty 15

## 2020-09-02 MED ORDER — SODIUM CHLORIDE 0.9% FLUSH
10.0000 mL | Freq: Once | INTRAVENOUS | Status: AC
  Administered 2020-09-02: 10 mL via INTRAVENOUS
  Filled 2020-09-02: qty 10

## 2020-09-02 MED ORDER — DIPHENHYDRAMINE HCL 50 MG/ML IJ SOLN
50.0000 mg | Freq: Once | INTRAMUSCULAR | Status: AC
  Administered 2020-09-02: 50 mg via INTRAVENOUS
  Filled 2020-09-02: qty 1

## 2020-09-02 MED ORDER — PALONOSETRON HCL INJECTION 0.25 MG/5ML
0.2500 mg | Freq: Once | INTRAVENOUS | Status: AC
  Administered 2020-09-02: 0.25 mg via INTRAVENOUS
  Filled 2020-09-02: qty 5

## 2020-09-02 NOTE — Progress Notes (Signed)
[facility] NOTE  Patient Care Team: Baxter Hire, MD as PCP - General (Internal Medicine) [name], RN as Oncology Nurse Navigator [name], MD as Consulting Physician (Hematology and Oncology)  CHIEF COMPLAINTS/PURPOSE OF CONSULTATION: lung nodule/mass  #  Oncology History Overview Note  # NOV 2021- LEFT LOWER LOBE NON-SMALL CELL CA [favor adeno ca]; T2N3 [right hilar; subcarinal LN;[name]; NOV 2021-MRI Brain-NEG  # 11/30- carbo-Taxol-RT [RT until 10/23/20]   # A.fib [Eliquis; Dr.Kowalski]  # # SURVIVORSHIP:   # GENETICS:   #NGS-ordered  DIAGNOSIS: Lung cancer  STAGE:   III      ;  GOALS:  cure  CURRENT/MOST RECENT THERAPY : carbo-Taxol-RT ]   Cancer of lower lobe of left lung ([place])  08/14/2020 Initial Diagnosis   Cancer of lower lobe of left lung ([place])   09/02/2020 -  Chemotherapy   The patient had dexamethasone (DECADRON) 4 MG tablet, 8 mg, Oral, Daily, 1 of 1 cycle, Start date: --, End date: -- palonosetron (ALOXI) injection 0.25 mg, 0.25 mg, Intravenous,  Once, 1 of 8 cycles CARBOplatin (PARAPLATIN) 150 mg in sodium chloride 0.9 % 100 mL chemo infusion, 150 mg (100 % of original dose 149.4 mg), Intravenous,  Once, 1 of 8 cycles Dose modification:   (original dose 149.4 mg, Cycle 1) PACLitaxel (TAXOL) 90 mg in sodium chloride 0.9 % 250 mL chemo infusion (</= 80mg /m2), 45 mg/m2 = 90 mg, Intravenous,  Once, 1 of 8 cycles  for chemotherapy treatment.       HISTORY OF PRESENTING ILLNESS:  Jason Warren 86 y.o.  male patient with stage III lung non-small cell lung-is here to proceed with chemoradiation.  In the interim evaluated by radiation oncology.  Planning to start radiation tomorrow dec1st.   Denies any worsening shortness of breath.  Currently denies any pain.  No nausea no vomiting.no Headache.  Mild cough.  No weight loss.  No loss of appetite.  No headaches.   Review of Systems  Constitutional:  Negative for chills, diaphoresis, fever, malaise/fatigue and weight loss.  HENT: Negative for nosebleeds and sore throat.   Eyes: Negative for double vision.  Respiratory: Positive for cough. Negative for hemoptysis, sputum production, shortness of breath and wheezing.   Cardiovascular: Negative for chest pain, palpitations, orthopnea and leg swelling.  Gastrointestinal: Negative for abdominal pain, blood in stool, constipation, diarrhea, heartburn, melena, nausea and vomiting.  Genitourinary: Negative for dysuria, frequency and urgency.  Musculoskeletal: Negative for back pain and joint pain.  Skin: Negative.  Negative for itching and rash.  Neurological: Positive for dizziness. Negative for tingling, focal weakness, weakness and headaches.  Endo/Heme/Allergies: Does not bruise/bleed easily.  Psychiatric/Behavioral: Negative for depression. The patient is not nervous/anxious and does not have insomnia.      MEDICAL HISTORY:  Past Medical History:  Diagnosis Date   Arthritis    Dysrhythmia    A-fib   Hip pain    left   HOH (hard of hearing)    LBBB (left bundle branch block) 08/05/2020   Skin cancer, basal cell     face top of head    SURGICAL HISTORY: Past Surgical History:  Procedure Laterality Date   CATARACT EXTRACTION W/PHACO Right 07/13/2017   Procedure: CATARACT EXTRACTION PHACO AND INTRAOCULAR LENS PLACEMENT (IOC);  Surgeon: Leandrew Koyanagi, MD;  Location: [facility];  Service: Ophthalmology;  Laterality: Right;  IVA TOPICAL RIGHT   CATARACT EXTRACTION W/PHACO Left 01/04/2018   Procedure: CATARACT EXTRACTION PHACO AND  INTRAOCULAR LENS PLACEMENT (IOC) LEFT;  Surgeon: Leandrew Koyanagi, MD;  Location: [facility];  Service: Ophthalmology;  Laterality: Left;   COLONOSCOPY     IR IMAGING GUIDED PORT INSERTION  08/27/2020   JOINT REPLACEMENT     Left total hip Dr. Su Hoff 08-04-18   ROTATOR CUFF REPAIR Right    SKIN CANCER EXCISION      face   TOTAL HIP ARTHROPLASTY Left 08/04/2018   Procedure: TOTAL HIP ARTHROPLASTY ANTERIOR APPROACH;  Surgeon: Frederik Pear, MD;  Location: [facility];  Service: Orthopedics;  Laterality: Left;   VIDEO BRONCHOSCOPY WITH ENDOBRONCHIAL NAVIGATION N/A 08/07/2020   Procedure: VIDEO BRONCHOSCOPY WITH ENDOBRONCHIAL NAVIGATION;  Surgeon: Ottie Glazier, MD;  Location: [facility];  Service: Thoracic;  Laterality: N/A;   VIDEO BRONCHOSCOPY WITH ENDOBRONCHIAL ULTRASOUND N/A 08/07/2020   Procedure: VIDEO BRONCHOSCOPY WITH ENDOBRONCHIAL ULTRASOUND;  Surgeon: Ottie Glazier, MD;  Location: [facility];  Service: Thoracic;  Laterality: N/A;    SOCIAL HISTORY: Social History   Socioeconomic History   Marital status: Married    Spouse name: Not on file   Number of children: Not on file   Years of education: Not on file   Highest education level: Not on file  Occupational History   Not on file  Tobacco Use   Smoking status: Former Smoker    Packs/day: 1.00    Years: 4.00    Pack years: 4.00    Types: Cigarettes   Smokeless tobacco: Never Used   Tobacco comment: quit early 70's  Vaping Use   Vaping Use: Never used  Substance and Sexual Activity   Alcohol use: No   Drug use: No   Sexual activity: Not Currently  Other Topics Concern   Not on file  Social History Narrative   > quit 35 years; smoked for 15 years. Rare alcohol. 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    Social Determinants of Health   Financial Resource Strain:    Difficulty of Paying Living Expenses: Not on file  Food Insecurity:    Worried About Charity fundraiser in the Last Year: Not on file   [employer] of Food in the Last Year: Not on file  Transportation Needs:    Lack of Transportation (Medical): Not on file   Lack of Transportation (Non-Medical): Not on file  Physical Activity:    Days of Exercise per Week: Not on file   Minutes of  Exercise per Session: Not on file  Stress:    Feeling of Stress : Not on file  Social Connections:    Frequency of Communication with Friends and Family: Not on file   Frequency of Social Gatherings with Friends and Family: Not on file   Attends Religious Services: Not on file   Active Member of Clubs or Organizations: Not on file   Attends Archivist Meetings: Not on file   Marital Status: Not on file  Intimate Partner Violence:    Fear of Current or Ex-Partner: Not on file   Emotionally Abused: Not on file   Physically Abused: Not on file   Sexually Abused: Not on file    FAMILY HISTORY: Family History  Problem Relation Age of Onset   Throat cancer Brother         & lung cancer    ALLERGIES:  has No Known Allergies.  MEDICATIONS:  Current Outpatient Medications  Medication Sig Dispense Refill  acetaminophen (TYLENOL) 500 MG tablet Take 500 mg by mouth every 8 (eight) hours as needed for moderate pain.      apixaban (ELIQUIS) 5 MG TABS tablet Take 5 mg by mouth 2 (two) times daily.     hydroxypropyl methylcellulose / hypromellose (ISOPTO TEARS / GONIOVISC) 2.5 % ophthalmic solution Place 1 drop into the left eye daily as needed for dry eyes.     lidocaine-prilocaine (EMLA) cream Apply 1 application topically as needed. Apply small amount to port site at least 1 hour prior to it being accessed, cover with plastic wrap 30 g 1   Multiple Vitamin (MULTIVITAMIN) tablet Take 1 tablet by mouth daily.     ondansetron (ZOFRAN) 8 MG tablet One pill every 8 hours as needed for nausea/vomitting. 40 tablet 1   Polyethyl Glycol-Propyl Glycol (SYSTANE FREE OP) Apply to eye.     pravastatin (PRAVACHOL) 80 MG tablet Take 80 mg by mouth at bedtime.      prochlorperazine (COMPAZINE) 10 MG tablet Take 1 tablet (10 mg total) by mouth every 6 (six) hours as needed for nausea or vomiting. 40 tablet 1   saw palmetto 160 MG capsule Take 160 mg by mouth 2 (two) times  daily.     No current facility-administered medications for this visit.   Facility-Administered Medications Ordered in Other Visits  Medication Dose Route Frequency Provider Last Rate Last Admin   CARBOplatin (PARAPLATIN) 150 mg in sodium chloride 0.9 % 250 mL chemo infusion  150 mg Intravenous Once [name], MD       dexamethasone (DECADRON) 20 mg in sodium chloride 0.9 % 50 mL IVPB  20 mg Intravenous Once [name], MD       diphenhydrAMINE (BENADRYL) injection 50 mg  50 mg Intravenous Once [name], MD       famotidine (PEPCID) IVPB 20 mg premix  20 mg Intravenous Once [name], MD       PACLitaxel (TAXOL) 90 mg in sodium chloride 0.9 % 250 mL chemo infusion (</= 80mg /m2)  45 mg/m2 (Treatment Plan Recorded) Intravenous Once [name], MD       palonosetron (ALOXI) injection 0.25 mg  0.25 mg Intravenous Once Charlaine Dalton R, MD          .  PHYSICAL EXAMINATION: ECOG PERFORMANCE STATUS: 1 - Symptomatic but completely ambulatory  Vitals:   09/02/20 0909  BP: 138/66  Pulse: 66  Resp: 16  Temp: 97.8 F (36.6 C)  SpO2: 100%   Filed Weights   09/02/20 0909  Weight: 164 lb (74.4 kg)    Physical Exam Constitutional:      Comments: Walking independently but with a limp because of left hip arthritis.  Accompanied by his wife and daughter.  HENT:     Head: Normocephalic and atraumatic.     Mouth/Throat:     Pharynx: No oropharyngeal exudate.  Eyes:     Pupils: Pupils are equal, round, and reactive to light.  Cardiovascular:     Rate and Rhythm: Normal rate and regular rhythm.  Pulmonary:     Effort: Pulmonary effort is normal. No respiratory distress.     Breath sounds: Normal breath sounds. No wheezing.  Abdominal:     General: Bowel sounds are normal. There is no distension.     Palpations: Abdomen is soft. There is no mass.     Tenderness: There is no abdominal tenderness. There is no guarding or  rebound.  Musculoskeletal:  General: No tenderness. Normal range of motion.     Cervical back: Normal range of motion and neck supple.  Skin:    General: Skin is warm.  Neurological:     Mental Status: He is alert and oriented to person, place, and time.  Psychiatric:        Mood and Affect: Affect normal.      LABORATORY DATA:  I have reviewed the data as listed Lab Results  Component Value Date   WBC 6.7 09/02/2020   HGB 14.2 09/02/2020   HCT 42.1 09/02/2020   MCV 95.5 09/02/2020   PLT 274 09/02/2020   Recent Labs    08/18/20 1543 08/27/20 0857 09/02/20 0846  NA 137 138 136  K 4.3 4.5 4.1  CL 100 100 102  CO2 28 27 24   GLUCOSE 100* 104* 145*  BUN 17 22 24*  CREATININE 1.06 1.16 1.06  CALCIUM 9.0 9.5 9.0  GFRNONAA >60 >60 >60  PROT 7.7  --  7.4  ALBUMIN 4.1  --  4.0  AST 25  --  26  ALT 20  --  18  ALKPHOS 104  --  106  BILITOT 0.8  --  0.8    RADIOGRAPHIC STUDIES: I have personally reviewed the radiological images as listed and agreed with the findings in the report. MR Brain W Wo Contrast  Result Date: 08/19/2020 CLINICAL DATA:  Lung cancer, staging EXAM: MRI HEAD WITHOUT AND WITH CONTRAST TECHNIQUE: Multiplanar, multiecho pulse sequences of the brain and surrounding structures were obtained without and with intravenous contrast. CONTRAST:  7.5 mL Gadavist COMPARISON:  None. FINDINGS: Brain: There is no acute infarction or intracranial hemorrhage. There is no intracranial mass, mass effect, or edema. There is no hydrocephalus or extra-axial fluid collection. Prominence of the ventricles and sulci reflects mild generalized parenchymal volume loss. Patchy areas of T2 hyperintensity in the supratentorial white matter are nonspecific but may reflect mild to moderate microvascular ischemic changes. No abnormal enhancement. Vascular: Major vessel flow voids at the skull base are preserved. Skull and upper cervical spine: Normal marrow signal is preserved.  Sinuses/Orbits: Paranasal sinuses are aerated. Orbits are unremarkable. Other: Sella is unremarkable.  Mastoid air cells are clear. IMPRESSION: No evidence of intracranial metastatic disease. Electronically Signed   By: Macy Mis M.D.   On: 08/19/2020 15:22   DG Chest Port 1 View  Result Date: 08/07/2020 CLINICAL DATA:  Status post bronchoscopy. EXAM: PORTABLE CHEST 1 VIEW COMPARISON:  August 04, 2020. FINDINGS: Left lower lobe mass is noted as described on prior CT scan. No pneumothorax or pleural effusion is noted. Right infrahilar opacity is noted concerning for scarring or bronchiectasis. Bony thorax is unremarkable. IMPRESSION: Left lower lobe mass is noted as described on prior CT scan. Right infrahilar opacity is noted concerning for scarring or bronchiectasis. No pneumothorax is noted. Aortic Atherosclerosis (ICD10-I70.0). Electronically Signed   By: Marijo Conception M.D.   On: 08/07/2020 10:22   DG C-Arm 1-60 Min-No Report  Result Date: 08/07/2020 Fluoroscopy was utilized by the requesting physician.  No radiographic interpretation.   IR IMAGING GUIDED PORT INSERTION  Result Date: 08/27/2020 INDICATION: 86 year old male with left lower lobe non-small cell lung cancer. He requires durable venous access for chemotherapy. EXAM: IMPLANTED PORT A CATH PLACEMENT WITH ULTRASOUND AND FLUOROSCOPIC GUIDANCE MEDICATIONS: 2 g Ancef; The antibiotic was administered within an appropriate time interval prior to skin puncture. ANESTHESIA/SEDATION: Versed 1 mg IV; Fentanyl 25 mcg IV; Moderate Sedation Time:  15  minutes The patient was continuously monitored during the procedure by the interventional radiology nurse under my direct supervision. FLUOROSCOPY TIME:  0 minutes, 48 seconds (4.01 mGy) COMPLICATIONS: None immediate. PROCEDURE: The right neck and chest was prepped with chlorhexidine, and draped in the usual sterile fashion using maximum barrier technique (cap and mask, sterile gown, sterile gloves,  large sterile sheet, hand hygiene and cutaneous antiseptic). Local anesthesia was attained by infiltration with 1% lidocaine with epinephrine. Ultrasound demonstrated patency of the right internal jugular vein, and this was documented with an image. Under real-time ultrasound guidance, this vein was accessed with a 21 gauge micropuncture needle and image documentation was performed. A small dermatotomy was made at the access site with an 11 scalpel. A 0.018" wire was advanced into the SVC and the access needle exchanged for a 73F micropuncture vascular sheath. The 0.018" wire was then removed and a 0.035" wire advanced into the IVC. An appropriate location for the subcutaneous reservoir was selected below the clavicle and an incision was made through the skin and underlying soft tissues. The subcutaneous tissues were then dissected using a combination of blunt and sharp surgical technique and a pocket was formed. A single lumen power injectable portacatheter was then tunneled through the subcutaneous tissues from the pocket to the dermatotomy and the port reservoir placed within the subcutaneous pocket. The venous access site was then serially dilated and a peel away vascular sheath placed over the wire. The wire was removed and the port catheter advanced into position under fluoroscopic guidance. The catheter tip is positioned in the superior cavoatrial junction. This was documented with a spot image. The portacatheter was then tested and found to flush and aspirate well. The port was flushed with saline followed by 100 units/mL heparinized saline. The pocket was then closed in two layers using first subdermal inverted interrupted absorbable sutures followed by a running subcuticular suture. The epidermis was then sealed with Dermabond. The dermatotomy at the venous access site was also closed with Dermabond. IMPRESSION: Successful placement of a right IJ approach Power Port with ultrasound and fluoroscopic  guidance. The catheter is ready for use. Electronically Signed   By: Jacqulynn Cadet M.D.   On: 08/27/2020 10:47   CT Super D Chest Wo Contrast  Result Date: 08/04/2020 CLINICAL DATA:  Bronchoscopy scheduled 08/07/2020. Mediastinal pain, mid chest pain. EXAM: CT CHEST WITHOUT CONTRAST TECHNIQUE: Multidetector CT imaging of the chest was performed using thin slice collimation for electromagnetic bronchoscopy planning purposes, without intravenous contrast. COMPARISON:  PET 07/22/2020 and CT chest 07/17/2020. FINDINGS: Cardiovascular: Atherosclerotic calcification of the aorta, aortic valve and coronary arteries. Right and left pulmonary arteries and heart are enlarged. No pericardial effusion. Mediastinum/Nodes: Low left paratracheal lymph node measures 1.6 cm, stable. There are additional subcentimeter short axis mediastinal lymph nodes. Subcarinal lymph node measures 1.5 cm, as before. Hilar regions are difficult to evaluate without IV contrast. No axillary adenopathy. Esophagus is grossly unremarkable. Lungs/Pleura: A spiculated mass in the superior segment left lower lobe measures 3.3 x 3.7 cm. There is a short border of contact, and extensions to, the adjacent visceral pleural, with pleural thickening. An area of irregular soft tissue, architectural distortion and bronchiectasis in the superior segment right lower lobe measures 3.5 x 3.7 cm. Inferiorly, there is some associated ground-glass along the peribronchovascular interstitium. No pleural fluid. Airway is unremarkable. Upper Abdomen: 1.5 cm low-attenuation lesion in the dome of the liver is unchanged but too small to characterize. Visualized portions of the liver, gallbladder  and adrenal glands are otherwise unremarkable. Low-attenuation lesions in the kidneys measure up to 2.7 cm on the right, the largest of which is likely a cyst. Others are too small to characterize. Visualized portions of the kidneys, spleen, pancreas, stomach and bowel are  otherwise grossly unremarkable. Musculoskeletal: Degenerative changes in the spine. No worrisome lytic or sclerotic lesions. T12 and L1 compression deformities are unchanged. Old sternal fracture. IMPRESSION: 1. Spiculated mass in the superior segment left lower lobe with associated mediastinal adenopathy, characterized as at least stage IIIA primary bronchogenic carcinoma on 07/22/2020. 2. Confluent area of parenchymal retraction and bronchiectasis in the right lower lobe, not shown to be hypermetabolic on recent PET. Note is made of associated ground-glass along the peribronchovascular interstitium, inferior to this lesion. Adenocarcinoma cannot be excluded. Continued attention on follow-up exams is suggested. 3. Aortic atherosclerosis (ICD10-I70.0). Coronary artery calcification. 4. Enlarged pulmonary arteries, indicative of pulmonary arterial hypertension. Electronically Signed   By: Lorin Picket M.D.   On: 08/04/2020 13:47    ASSESSMENT & PLAN:   Cancer of lower lobe of left lung ([place]) # T2N3- Stage III-non-small cell lung cancer-[favor adenocarcinoma]- plan concurrent chemoradiation weekly carbotaxol with the daily radiation.[start 11/30]   #Proceed with cycle #1 of carbotaxol; Labs today reviewed;  acceptable for treatment today.  Start radiation December 1.  Monitor closely given patient's suspected low bone marrow results.  #History of A. fib on Eliquis. STABLE.   # DISPOSITION; # chemo today # in 1 week- labs- cbc/bmp;carbo-taxol # follow up in 2 weeks- MD; labs- cbc/cmp; carbo-taxol- Dr.B    All questions were answered. The patient knows to call the clinic with any problems, questions or concerns.   [name], MD 09/02/2020 10:06 AM

## 2020-09-02 NOTE — Progress Notes (Signed)
  Oncology Nurse Navigator Documentation  Navigator Location: [facility] (09/02/20 1200)   )Navigator Encounter Type: Follow-up Appt (09/02/20 1200)                   Treatment Initiated Date: 09/02/20 (09/02/20 1200) Patient Visit Type: MedOnc (09/02/20 1200) Treatment Phase: First Chemo Tx (09/02/20 1200) Barriers/Navigation Needs: No Barriers At This Time;No Needs;No Questions (09/02/20 1200)   Interventions: None Required (09/02/20 1200)             met with patient prior to first chemotherapy treatment today. All questions answered during visit. Reviewed upcoming appts with patient. Instructed to call with any further questions or needs. Pt verbalized understanding. Nothing further needed at this time.         Time Spent with Patient: 30 (09/02/20 1200)

## 2020-09-02 NOTE — Assessment & Plan Note (Addendum)
#   T2N3- Stage III-non-small cell lung cancer-[favor adenocarcinoma]- plan concurrent chemoradiation weekly carbotaxol with the daily radiation.[start 11/30]   #Proceed with cycle #1 of carbotaxol; Labs today reviewed;  acceptable for treatment today.  Start radiation December 1.  Monitor closely given patient's suspected low bone marrow results.  #History of A. fib on Eliquis. STABLE.   # DISPOSITION; # chemo today # in 1 week- labs- cbc/bmp;carbo-taxol # follow up in 2 weeks- MD; labs- cbc/cmp; carbo-taxol- Dr.B

## 2020-09-03 ENCOUNTER — Ambulatory Visit
Admission: RE | Admit: 2020-09-03 | Discharge: 2020-09-03 | Disposition: A | Discharge status: Home / Self Care | Payer: Medicare Other | Source: Ambulatory Visit | Attending: Radiation Oncology | Admitting: Radiation Oncology

## 2020-09-03 ENCOUNTER — Inpatient Hospital Stay: Payer: Medicare Other | Attending: Internal Medicine | Admitting: Hospice and Palliative Medicine

## 2020-09-03 DIAGNOSIS — Z510 Encounter for antineoplastic radiation therapy: Secondary | ICD-10-CM | POA: Insufficient documentation

## 2020-09-03 DIAGNOSIS — Z5111 Encounter for antineoplastic chemotherapy: Secondary | ICD-10-CM | POA: Insufficient documentation

## 2020-09-03 DIAGNOSIS — C3432 Malignant neoplasm of lower lobe, left bronchus or lung: Secondary | ICD-10-CM | POA: Insufficient documentation

## 2020-09-03 DIAGNOSIS — L598 Other specified disorders of the skin and subcutaneous tissue related to radiation: Secondary | ICD-10-CM | POA: Insufficient documentation

## 2020-09-03 DIAGNOSIS — C3490 Malignant neoplasm of unspecified part of unspecified bronchus or lung: Secondary | ICD-10-CM

## 2020-09-03 DIAGNOSIS — I4891 Unspecified atrial fibrillation: Secondary | ICD-10-CM | POA: Insufficient documentation

## 2020-09-03 DIAGNOSIS — K208 Other esophagitis without bleeding: Secondary | ICD-10-CM | POA: Insufficient documentation

## 2020-09-03 DIAGNOSIS — Z7901 Long term (current) use of anticoagulants: Secondary | ICD-10-CM | POA: Insufficient documentation

## 2020-09-03 NOTE — Progress Notes (Signed)
Multidisciplinary Oncology Council Documentation  Amit Leece was presented by our [facility] on 09/03/2020, which included representatives from:  . Palliative Care . Dietitian . Physical/Occupational Therapist . Speech Therapist . Survivorship Nurse . Nurse Navigator . Social work . Genetics    Sahan currently presents with history of stage III lung cancer  We reviewed previous medical and familial history, history of present illness, and recent lab results along with all available histopathologic and imaging studies. The [facility] considered available treatment options and made the following recommendations/referrals: No orders of the defined types were placed in this encounter.  Would recommend ACP conversation if patient is willing Recommend OT if weakness with chemo Recommend RD if needed  The MOC is a meeting of clinicians from various specialty areas who evaluate and discuss patients for whom a multidisciplinary approach is being considered. Final determinations in the plan of care are those of the provider(s).   Today's extended care, comprehensive team conference, Jarriel was not present for the discussion and was not examined.

## 2020-09-04 ENCOUNTER — Ambulatory Visit
Admission: RE | Admit: 2020-09-04 | Discharge: 2020-09-04 | Disposition: A | Discharge status: Home / Self Care | Payer: Medicare Other | Source: Ambulatory Visit | Attending: Radiation Oncology | Admitting: Radiation Oncology

## 2020-09-04 DIAGNOSIS — Z510 Encounter for antineoplastic radiation therapy: Secondary | ICD-10-CM | POA: Diagnosis not present

## 2020-09-05 ENCOUNTER — Ambulatory Visit
Admission: RE | Admit: 2020-09-05 | Discharge: 2020-09-05 | Disposition: A | Discharge status: Home / Self Care | Payer: Medicare Other | Source: Ambulatory Visit | Attending: Radiation Oncology | Admitting: Radiation Oncology

## 2020-09-05 ENCOUNTER — Encounter: Payer: Self-pay | Admitting: Internal Medicine

## 2020-09-05 DIAGNOSIS — Z510 Encounter for antineoplastic radiation therapy: Secondary | ICD-10-CM | POA: Diagnosis not present

## 2020-09-08 ENCOUNTER — Ambulatory Visit
Admission: RE | Admit: 2020-09-08 | Discharge: 2020-09-08 | Disposition: A | Discharge status: Home / Self Care | Payer: Medicare Other | Source: Ambulatory Visit | Attending: Radiation Oncology | Admitting: Radiation Oncology

## 2020-09-08 DIAGNOSIS — Z510 Encounter for antineoplastic radiation therapy: Secondary | ICD-10-CM | POA: Diagnosis not present

## 2020-09-09 ENCOUNTER — Telehealth: Payer: Self-pay

## 2020-09-09 ENCOUNTER — Ambulatory Visit
Admission: RE | Admit: 2020-09-09 | Discharge: 2020-09-09 | Disposition: A | Discharge status: Home / Self Care | Payer: Medicare Other | Source: Ambulatory Visit | Attending: Radiation Oncology | Admitting: Radiation Oncology

## 2020-09-09 DIAGNOSIS — Z510 Encounter for antineoplastic radiation therapy: Secondary | ICD-10-CM | POA: Diagnosis not present

## 2020-09-09 NOTE — Telephone Encounter (Signed)
T/C to pt for follow up after receiving first chemo but no answer.   Left message letting him know we were calling to check on him and encouraged pt to call for any questions or concerns.

## 2020-09-10 ENCOUNTER — Ambulatory Visit
Admission: RE | Admit: 2020-09-10 | Discharge: 2020-09-10 | Disposition: A | Discharge status: Home / Self Care | Payer: Medicare Other | Source: Ambulatory Visit | Attending: Radiation Oncology | Admitting: Radiation Oncology

## 2020-09-10 ENCOUNTER — Inpatient Hospital Stay: Payer: Medicare Other

## 2020-09-10 ENCOUNTER — Inpatient Hospital Stay (HOSPITAL_BASED_OUTPATIENT_CLINIC_OR_DEPARTMENT_OTHER): Payer: Medicare Other | Admitting: Internal Medicine

## 2020-09-10 ENCOUNTER — Encounter: Payer: Self-pay | Admitting: *Deleted

## 2020-09-10 ENCOUNTER — Other Ambulatory Visit: Payer: Self-pay

## 2020-09-10 VITALS — BP 137/77 | HR 68 | Resp 16

## 2020-09-10 DIAGNOSIS — I4891 Unspecified atrial fibrillation: Secondary | ICD-10-CM | POA: Diagnosis not present

## 2020-09-10 DIAGNOSIS — C3432 Malignant neoplasm of lower lobe, left bronchus or lung: Secondary | ICD-10-CM | POA: Diagnosis present

## 2020-09-10 DIAGNOSIS — Z510 Encounter for antineoplastic radiation therapy: Secondary | ICD-10-CM | POA: Diagnosis not present

## 2020-09-10 DIAGNOSIS — Z5111 Encounter for antineoplastic chemotherapy: Secondary | ICD-10-CM | POA: Diagnosis present

## 2020-09-10 DIAGNOSIS — Z7901 Long term (current) use of anticoagulants: Secondary | ICD-10-CM | POA: Diagnosis not present

## 2020-09-10 DIAGNOSIS — L598 Other specified disorders of the skin and subcutaneous tissue related to radiation: Secondary | ICD-10-CM | POA: Diagnosis not present

## 2020-09-10 DIAGNOSIS — K208 Other esophagitis without bleeding: Secondary | ICD-10-CM | POA: Diagnosis not present

## 2020-09-10 LAB — COMPREHENSIVE METABOLIC PANEL
ALT: 19 U/L (ref 0–44)
AST: 24 U/L (ref 15–41)
Albumin: 4 g/dL (ref 3.5–5.0)
Alkaline Phosphatase: 97 U/L (ref 38–126)
Anion gap: 9 (ref 5–15)
BUN: 22 mg/dL (ref 8–23)
CO2: 23 mmol/L (ref 22–32)
Calcium: 8.7 mg/dL — ABNORMAL LOW (ref 8.9–10.3)
Chloride: 102 mmol/L (ref 98–111)
Creatinine, Ser: 0.97 mg/dL (ref 0.61–1.24)
GFR, Estimated: >60 mL/min (ref >60)
Glucose, Bld: 94 mg/dL (ref 70–99)
Potassium: 4.3 mmol/L (ref 3.5–5.1)
Sodium: 134 mmol/L — ABNORMAL LOW (ref 135–145)
Total Bilirubin: 1 mg/dL (ref 0.3–1.2)
Total Protein: 6.9 g/dL (ref 6.5–8.1)

## 2020-09-10 LAB — CBC WITH DIFFERENTIAL/PLATELET
Abs Immature Granulocytes: 0.03 10*3/uL (ref 0.00–0.07)
Basophils Absolute: 0 10*3/uL (ref 0.0–0.1)
Basophils Relative: 1 %
Eosinophils Absolute: 0.1 10*3/uL (ref 0.0–0.5)
Eosinophils Relative: 2 %
HCT: 39.4 % (ref 39.0–52.0)
Hemoglobin: 13.5 g/dL (ref 13.0–17.0)
Immature Granulocytes: 1 %
Lymphocytes Relative: 28 %
Lymphs Abs: 1.6 10*3/uL (ref 0.7–4.0)
MCH: 32.5 pg (ref 26.0–34.0)
MCHC: 34.3 g/dL (ref 30.0–36.0)
MCV: 94.9 fL (ref 80.0–100.0)
Monocytes Absolute: 0.4 10*3/uL (ref 0.1–1.0)
Monocytes Relative: 8 %
Neutro Abs: 3.5 10*3/uL (ref 1.7–7.7)
Neutrophils Relative %: 60 %
Platelets: 267 10*3/uL (ref 150–400)
RBC: 4.15 MIL/uL — ABNORMAL LOW (ref 4.22–5.81)
RDW: 12.4 % (ref 11.5–15.5)
WBC: 5.6 10*3/uL (ref 4.0–10.5)
nRBC: 0 % (ref 0.0–0.2)

## 2020-09-10 MED ORDER — DIPHENHYDRAMINE HCL 50 MG/ML IJ SOLN
50.0000 mg | Freq: Once | INTRAMUSCULAR | Status: AC
  Administered 2020-09-10: 50 mg via INTRAVENOUS
  Filled 2020-09-10: qty 1

## 2020-09-10 MED ORDER — HEPARIN SOD (PORK) LOCK FLUSH 100 UNIT/ML IV SOLN
INTRAVENOUS | Status: AC
  Filled 2020-09-10: qty 5

## 2020-09-10 MED ORDER — SODIUM CHLORIDE 0.9 % IV SOLN
150.0000 mg | Freq: Once | INTRAVENOUS | Status: AC
  Administered 2020-09-10: 150 mg via INTRAVENOUS
  Filled 2020-09-10: qty 15

## 2020-09-10 MED ORDER — PALONOSETRON HCL INJECTION 0.25 MG/5ML
0.2500 mg | Freq: Once | INTRAVENOUS | Status: AC
  Administered 2020-09-10: 0.25 mg via INTRAVENOUS
  Filled 2020-09-10: qty 5

## 2020-09-10 MED ORDER — SODIUM CHLORIDE 0.9 % IV SOLN
45.0000 mg/m2 | Freq: Once | INTRAVENOUS | Status: AC
  Administered 2020-09-10: 90 mg via INTRAVENOUS
  Filled 2020-09-10: qty 15

## 2020-09-10 MED ORDER — SODIUM CHLORIDE 0.9 % IV SOLN
Freq: Once | INTRAVENOUS | Status: AC
  Filled 2020-09-10: qty 250

## 2020-09-10 MED ORDER — SODIUM CHLORIDE 0.9 % IV SOLN
20.0000 mg | Freq: Once | INTRAVENOUS | Status: AC
  Administered 2020-09-10: 20 mg via INTRAVENOUS
  Filled 2020-09-10: qty 20

## 2020-09-10 MED ORDER — SODIUM CHLORIDE 0.9% FLUSH
10.0000 mL | Freq: Once | INTRAVENOUS | Status: AC
  Administered 2020-09-10: 10 mL via INTRAVENOUS
  Filled 2020-09-10: qty 10

## 2020-09-10 MED ORDER — HEPARIN SOD (PORK) LOCK FLUSH 100 UNIT/ML IV SOLN
500.0000 [IU] | Freq: Once | INTRAVENOUS | Status: AC | PRN
  Administered 2020-09-10: 500 [IU]
  Filled 2020-09-10: qty 5

## 2020-09-10 MED ORDER — FAMOTIDINE IN NACL 20-0.9 MG/50ML-% IV SOLN
20.0000 mg | Freq: Once | INTRAVENOUS | Status: AC
  Administered 2020-09-10: 20 mg via INTRAVENOUS
  Filled 2020-09-10: qty 50

## 2020-09-10 NOTE — Progress Notes (Signed)
[facility] NOTE  Patient Care Team: Baxter Hire, MD as PCP - General (Internal Medicine) [name], RN as Oncology Nurse Navigator [name], MD as Consulting Physician (Hematology and Oncology)  CHIEF COMPLAINTS/PURPOSE OF CONSULTATION: lung nodule/mass  #  Oncology History Overview Note  # NOV 2021- LEFT LOWER LOBE NON-SMALL CELL CA [favor adeno ca]; T2N3 [right hilar; subcarinal LN;[name]; NOV 2021-MRI Brain-NEG  # 11/30- carbo-Taxol-RT [RT until 10/23/20]   # A.fib [Eliquis; Dr.Kowalski]  # # SURVIVORSHIP:   # GENETICS:   #NGS-ordered  DIAGNOSIS: Lung cancer  STAGE:   III      ;  GOALS:  cure  CURRENT/MOST RECENT THERAPY : carbo-Taxol-RT ]   Cancer of lower lobe of left lung ([place])  08/14/2020 Initial Diagnosis   Cancer of lower lobe of left lung ([place])   09/02/2020 -  Chemotherapy   The patient had dexamethasone (DECADRON) 4 MG tablet, 8 mg, Oral, Daily, 1 of 1 cycle, Start date: --, End date: -- palonosetron (ALOXI) injection 0.25 mg, 0.25 mg, Intravenous,  Once, 2 of 8 cycles Administration: 0.25 mg (09/02/2020), 0.25 mg (09/10/2020) CARBOplatin (PARAPLATIN) 150 mg in sodium chloride 0.9 % 250 mL chemo infusion, 150 mg (100 % of original dose 149.4 mg), Intravenous,  Once, 2 of 8 cycles Dose modification:   (original dose 149.4 mg, Cycle 1),   (original dose 155.4 mg, Cycle 5) Administration: 150 mg (09/02/2020), 150 mg (09/10/2020) PACLitaxel (TAXOL) 90 mg in sodium chloride 0.9 % 250 mL chemo infusion (</= 80mg /m2), 45 mg/m2 = 90 mg, Intravenous,  Once, 2 of 8 cycles Administration: 90 mg (09/02/2020), 90 mg (09/10/2020)  for chemotherapy treatment.       HISTORY OF PRESENTING ILLNESS:  Jason Warren 86 y.o.  male patient with stage III lung non-small cell lung-currently on concurrent chemoradiation for follow-up.  Patient is status post 1 weekly chemotherapy treatment so far.  Patient denies any difficulty in  breathing.  Denies any difficulty swallowing.  No pain.  Complains of mild fatigue.  Denies any tingling or numbness.   Review of Systems  Constitutional: Negative for chills, diaphoresis, fever, malaise/fatigue and weight loss.  HENT: Negative for nosebleeds and sore throat.   Eyes: Negative for double vision.  Respiratory: Positive for cough. Negative for hemoptysis, sputum production, shortness of breath and wheezing.   Cardiovascular: Negative for chest pain, palpitations, orthopnea and leg swelling.  Gastrointestinal: Negative for abdominal pain, blood in stool, constipation, diarrhea, heartburn, melena, nausea and vomiting.  Genitourinary: Negative for dysuria, frequency and urgency.  Musculoskeletal: Negative for back pain and joint pain.  Skin: Negative.  Negative for itching and rash.  Neurological: Positive for dizziness. Negative for tingling, focal weakness, weakness and headaches.  Endo/Heme/Allergies: Does not bruise/bleed easily.  Psychiatric/Behavioral: Negative for depression. The patient is not nervous/anxious and does not have insomnia.      MEDICAL HISTORY:  Past Medical History:  Diagnosis Date  . Arthritis   . Dysrhythmia    A-fib  . Hip pain    left  . HOH (hard of hearing)   . LBBB (left bundle branch block) 08/05/2020  . Skin cancer, basal cell     face top of head    SURGICAL HISTORY: Past Surgical History:  Procedure Laterality Date  . CATARACT EXTRACTION W/PHACO Right 07/13/2017   Procedure: CATARACT EXTRACTION PHACO AND INTRAOCULAR LENS PLACEMENT ([place]);  Surgeon: Leandrew Koyanagi, MD;  Location: [facility];  Service: Ophthalmology;  Laterality: Right;  IVA TOPICAL RIGHT  . CATARACT EXTRACTION W/PHACO Left 01/04/2018   Procedure: CATARACT EXTRACTION PHACO AND INTRAOCULAR LENS PLACEMENT ([place]) LEFT;  Surgeon: Leandrew Koyanagi, MD;  Location: [facility];  Service: Ophthalmology;  Laterality: Left;  . COLONOSCOPY    . IR IMAGING  GUIDED PORT INSERTION  08/27/2020  . JOINT REPLACEMENT     Left total hip Dr. Su Hoff 08-04-18  . ROTATOR CUFF REPAIR Right   . SKIN CANCER EXCISION     face  . TOTAL HIP ARTHROPLASTY Left 08/04/2018   Procedure: TOTAL HIP ARTHROPLASTY ANTERIOR APPROACH;  Surgeon: Frederik Pear, MD;  Location: [facility];  Service: Orthopedics;  Laterality: Left;  Marland Kitchen VIDEO BRONCHOSCOPY WITH ENDOBRONCHIAL NAVIGATION N/A 08/07/2020   Procedure: VIDEO BRONCHOSCOPY WITH ENDOBRONCHIAL NAVIGATION;  Surgeon: Ottie Glazier, MD;  Location: [facility];  Service: Thoracic;  Laterality: N/A;  . VIDEO BRONCHOSCOPY WITH ENDOBRONCHIAL ULTRASOUND N/A 08/07/2020   Procedure: VIDEO BRONCHOSCOPY WITH ENDOBRONCHIAL ULTRASOUND;  Surgeon: Ottie Glazier, MD;  Location: [facility];  Service: Thoracic;  Laterality: N/A;    SOCIAL HISTORY: Social History   Socioeconomic History  . Marital status: Married    Spouse name: Not on file  . Number of children: Not on file  . Years of education: Not on file  . Highest education level: Not on file  Occupational History  . Not on file  Tobacco Use  . Smoking status: Former Smoker    Packs/day: 1.00    Years: 4.00    Pack years: 4.00    Types: Cigarettes  . Smokeless tobacco: Never Used  . Tobacco comment: quit early 70's  Vaping Use  . Vaping Use: Never used  Substance and Sexual Activity  . Alcohol use: No  . Drug use: No  . Sexual activity: Not Currently  Other Topics Concern  . Not on file  Social History Narrative   > quit 35 years; smoked for 15 years. Rare alcohol. 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    Social Determinants of Health   Financial Resource Strain:   . Difficulty of Paying Living Expenses: Not on file  Food Insecurity:   . Worried About Charity fundraiser in the Last Year: Not on file  . Ran Out of Food in the Last Year: Not on file  Transportation Needs:   . Lack of Transportation  (Medical): Not on file  . Lack of Transportation (Non-Medical): Not on file  Physical Activity:   . Days of Exercise per Week: Not on file  . Minutes of Exercise per Session: Not on file  Stress:   . Feeling of Stress : Not on file  Social Connections:   . Frequency of Communication with Friends and Family: Not on file  . Frequency of Social Gatherings with Friends and Family: Not on file  . Attends Religious Services: Not on file  . Active Member of Clubs or Organizations: Not on file  . Attends Archivist Meetings: Not on file  . Marital Status: Not on file  Intimate Partner Violence:   . Fear of Current or Ex-Partner: Not on file  . Emotionally Abused: Not on file  . Physically Abused: Not on file  . Sexually Abused: Not on file    FAMILY HISTORY: Family History  Problem Relation Age of Onset  . Throat cancer Brother         & lung cancer    ALLERGIES:  has No Known Allergies.  MEDICATIONS:  Current Outpatient Medications  Medication Sig Dispense Refill  . acetaminophen (TYLENOL) 500 MG tablet Take 500 mg by mouth every 8 (eight) hours as needed for moderate pain.     Marland Kitchen apixaban (ELIQUIS) 5 MG TABS tablet Take 5 mg by mouth 2 (two) times daily.    . hydroxypropyl methylcellulose / hypromellose (ISOPTO TEARS / GONIOVISC) 2.5 % ophthalmic solution Place 1 drop into the left eye daily as needed for dry eyes.    Marland Kitchen lidocaine-prilocaine (EMLA) cream Apply 1 application topically as needed. Apply small amount to port site at least 1 hour prior to it being accessed, cover with plastic wrap 30 g 1  . Multiple Vitamin (MULTIVITAMIN) tablet Take 1 tablet by mouth daily.    Vladimir Faster Glycol-Propyl Glycol (SYSTANE FREE OP) Apply to eye.    . pravastatin (PRAVACHOL) 80 MG tablet Take 80 mg by mouth at bedtime.     . saw palmetto 160 MG capsule Take 160 mg by mouth 2 (two) times daily.    . ondansetron (ZOFRAN) 8 MG tablet One pill every 8 hours as needed for  nausea/vomitting. (Patient not taking: Reported on 09/10/2020) 40 tablet 1  . prochlorperazine (COMPAZINE) 10 MG tablet Take 1 tablet (10 mg total) by mouth every 6 (six) hours as needed for nausea or vomiting. (Patient not taking: Reported on 09/10/2020) 40 tablet 1   No current facility-administered medications for this visit.      Marland Kitchen  PHYSICAL EXAMINATION: ECOG PERFORMANCE STATUS: 1 - Symptomatic but completely ambulatory  Vitals:   09/10/20 0953  BP: 134/68  Pulse: 62  Resp: 16  Temp: (!) 96 F (35.6 C)  SpO2: 100%   Filed Weights   09/10/20 0953  Weight: 166 lb (75.3 kg)    Physical Exam Constitutional:      Comments: Walking independently but with a limp because of left hip arthritis.  He is alone.  HENT:     Head: Normocephalic and atraumatic.     Mouth/Throat:     Pharynx: No oropharyngeal exudate.  Eyes:     Pupils: Pupils are equal, round, and reactive to light.  Cardiovascular:     Rate and Rhythm: Normal rate and regular rhythm.  Pulmonary:     Effort: Pulmonary effort is normal. No respiratory distress.     Breath sounds: Normal breath sounds. No wheezing.  Abdominal:     General: Bowel sounds are normal. There is no distension.     Palpations: Abdomen is soft. There is no mass.     Tenderness: There is no abdominal tenderness. There is no guarding or rebound.  Musculoskeletal:        General: No tenderness. Normal range of motion.     Cervical back: Normal range of motion and neck supple.  Skin:    General: Skin is warm.  Neurological:     Mental Status: He is alert and oriented to person, place, and time.  Psychiatric:        Mood and Affect: Affect normal.      LABORATORY DATA:  I have reviewed the data as listed Lab Results  Component Value Date   WBC 5.6 09/10/2020   HGB 13.5 09/10/2020   HCT 39.4 09/10/2020   MCV 94.9 09/10/2020   PLT 267 09/10/2020   Recent Labs    08/18/20 1543 08/18/20 1543 08/27/20 0857 09/02/20 0846  09/10/20 0936  NA 137   < > 138 136 134*  K 4.3   < > 4.5 4.1 4.3  CL 100   < > 100 102 102  CO2 28   < > 27 24 23   GLUCOSE 100*   < > 104* 145* 94  BUN 17   < > 22 24* 22  CREATININE 1.06   < > 1.16 1.06 0.97  CALCIUM 9.0   < > 9.5 9.0 8.7*  GFRNONAA >60   < > >60 >60 >60  PROT 7.7  --   --  7.4 6.9  ALBUMIN 4.1  --   --  4.0 4.0  AST 25  --   --  26 24  ALT 20  --   --  18 19  ALKPHOS 104  --   --  106 97  BILITOT 0.8  --   --  0.8 1.0   < > = values in this interval not displayed.    RADIOGRAPHIC STUDIES: I have personally reviewed the radiological images as listed and agreed with the findings in the report. MR Brain W Wo Contrast  Result Date: 08/19/2020 CLINICAL DATA:  Lung cancer, staging EXAM: MRI HEAD WITHOUT AND WITH CONTRAST TECHNIQUE: Multiplanar, multiecho pulse sequences of the brain and surrounding structures were obtained without and with intravenous contrast. CONTRAST:  7.5 mL Gadavist COMPARISON:  None. FINDINGS: Brain: There is no acute infarction or intracranial hemorrhage. There is no intracranial mass, mass effect, or edema. There is no hydrocephalus or extra-axial fluid collection. Prominence of the ventricles and sulci reflects mild generalized parenchymal volume loss. Patchy areas of T2 hyperintensity in the supratentorial white matter are nonspecific but may reflect mild to moderate microvascular ischemic changes. No abnormal enhancement. Vascular: Major vessel flow voids at the skull base are preserved. Skull and upper cervical spine: Normal marrow signal is preserved. Sinuses/Orbits: Paranasal sinuses are aerated. Orbits are unremarkable. Other: Sella is unremarkable.  Mastoid air cells are clear. IMPRESSION: No evidence of intracranial metastatic disease. Electronically Signed   By: Macy Mis M.D.   On: 08/19/2020 15:22   IR IMAGING GUIDED PORT INSERTION  Result Date: 08/27/2020 INDICATION: 86 year old male with left lower lobe non-small cell lung  cancer. He requires durable venous access for chemotherapy. EXAM: IMPLANTED PORT A CATH PLACEMENT WITH ULTRASOUND AND FLUOROSCOPIC GUIDANCE MEDICATIONS: 2 g Ancef; The antibiotic was administered within an appropriate time interval prior to skin puncture. ANESTHESIA/SEDATION: Versed 1 mg IV; Fentanyl 25 mcg IV; Moderate Sedation Time:  15 minutes The patient was continuously monitored during the procedure by the interventional radiology nurse under my direct supervision. FLUOROSCOPY TIME:  0 minutes, 48 seconds (9.56 mGy) COMPLICATIONS: None immediate. PROCEDURE: The right neck and chest was prepped with chlorhexidine, and draped in the usual sterile fashion using maximum barrier technique (cap and mask, sterile gown, sterile gloves, large sterile sheet, hand hygiene and cutaneous antiseptic). Local anesthesia was attained by infiltration with 1% lidocaine with epinephrine. Ultrasound demonstrated patency of the right internal jugular vein, and this was documented with an image. Under real-time ultrasound guidance, this vein was accessed with a 21 gauge micropuncture needle and image documentation was performed. A small dermatotomy was made at the access site with an 11 scalpel. A 0.018" wire was advanced into the SVC and the access needle exchanged for a 55F micropuncture vascular sheath. The 0.018" wire was then removed and a 0.035" wire advanced into the IVC. An appropriate location for the subcutaneous reservoir was selected below the clavicle and an incision was made through the skin and  underlying soft tissues. The subcutaneous tissues were then dissected using a combination of blunt and sharp surgical technique and a pocket was formed. A single lumen power injectable portacatheter was then tunneled through the subcutaneous tissues from the pocket to the dermatotomy and the port reservoir placed within the subcutaneous pocket. The venous access site was then serially dilated and a peel away vascular sheath  placed over the wire. The wire was removed and the port catheter advanced into position under fluoroscopic guidance. The catheter tip is positioned in the superior cavoatrial junction. This was documented with a spot image. The portacatheter was then tested and found to flush and aspirate well. The port was flushed with saline followed by 100 units/mL heparinized saline. The pocket was then closed in two layers using first subdermal inverted interrupted absorbable sutures followed by a running subcuticular suture. The epidermis was then sealed with Dermabond. The dermatotomy at the venous access site was also closed with Dermabond. IMPRESSION: Successful placement of a right IJ approach Power Port with ultrasound and fluoroscopic guidance. The catheter is ready for use. Electronically Signed   By: Jacqulynn Cadet M.D.   On: 08/27/2020 10:47    ASSESSMENT & PLAN:   Cancer of lower lobe of left lung ([place]) # T2N3- Stage III-non-small cell lung cancer-[favor adenocarcinoma]- plan concurrent chemoradiation weekly carbotaxol with the daily radiation.[start 11/30]  #Proceed with cycle #2 of carbotaxol; Labs today reviewed;  acceptable for treatment today ;RT- started onDecember 1]. Proceed with chemo today.   #History of A. fib on Eliquis. STABLE.   # DISPOSITION; # chemo today # in 1 week- labs- cbc/bmp;carbo-taxol- Cancel MD appt for next week.  # follow up in 2 weeks- MD; labs- cbc/cmp; carbo-taxol- Dr.B    All questions were answered. The patient knows to call the clinic with any problems, questions or concerns.   [name], MD 09/10/2020 7:01 PM

## 2020-09-10 NOTE — Progress Notes (Signed)
Patient tolerated infusion well. Patient and VSS. Discharged home  

## 2020-09-10 NOTE — Assessment & Plan Note (Signed)
#   T2N3- Stage III-non-small cell lung cancer-[favor adenocarcinoma]- plan concurrent chemoradiation weekly carbotaxol with the daily radiation.[start 11/30]  #Proceed with cycle #2 of carbotaxol; Labs today reviewed;  acceptable for treatment today ;RT- started onDecember 1]. Proceed with chemo today.   #History of A. fib on Eliquis. STABLE.   # DISPOSITION; # chemo today # in 1 week- labs- cbc/bmp;carbo-taxol- Cancel MD appt for next week.  # follow up in 2 weeks- MD; labs- cbc/cmp; carbo-taxol- Dr.B

## 2020-09-10 NOTE — Progress Notes (Signed)
  Oncology Nurse Navigator Documentation  Navigator Location: [facility] (09/10/20 1400)   )Navigator Encounter Type: Follow-up Appt;Treatment (09/10/20 1400)                     Patient Visit Type: MedOnc (09/10/20 1400) Treatment Phase: Treatment (09/10/20 1400) Barriers/Navigation Needs: No Barriers At This Time;No Needs (09/10/20 1400)   Interventions: None Required (09/10/20 1400)         met with patient during follow up visit with Dr. Rogue Bussing. Pt did not have any questions or needs during visit. Informed pt that if notices decline in performance status or appetite during course of treatment to let us know to make appropriate referrals to rehab and/or nutrition. Pt stated that he is doing well today and having no issues at this time. Instructed pt to call with any further questions or needs. Pt verbalized understanding. Nothing further needed at this time.             Time Spent with Patient: 30 (09/10/20 1400)

## 2020-09-10 NOTE — Progress Notes (Signed)
Patient here for follow-up and chemotherapy. He stated that he has tolerated his chemotherapy treatments well.

## 2020-09-11 ENCOUNTER — Ambulatory Visit
Admission: RE | Admit: 2020-09-11 | Discharge: 2020-09-11 | Disposition: A | Discharge status: Home / Self Care | Payer: Medicare Other | Source: Ambulatory Visit | Attending: Radiation Oncology | Admitting: Radiation Oncology

## 2020-09-11 DIAGNOSIS — Z510 Encounter for antineoplastic radiation therapy: Secondary | ICD-10-CM | POA: Diagnosis not present

## 2020-09-12 ENCOUNTER — Encounter: Payer: Self-pay | Admitting: Internal Medicine

## 2020-09-12 ENCOUNTER — Ambulatory Visit
Admission: RE | Admit: 2020-09-12 | Discharge: 2020-09-12 | Disposition: A | Discharge status: Home / Self Care | Payer: Medicare Other | Source: Ambulatory Visit | Attending: Radiation Oncology | Admitting: Radiation Oncology

## 2020-09-12 DIAGNOSIS — Z510 Encounter for antineoplastic radiation therapy: Secondary | ICD-10-CM | POA: Diagnosis not present

## 2020-09-15 ENCOUNTER — Ambulatory Visit
Admission: RE | Admit: 2020-09-15 | Discharge: 2020-09-15 | Disposition: A | Discharge status: Home / Self Care | Payer: Medicare Other | Source: Ambulatory Visit | Attending: Radiation Oncology | Admitting: Radiation Oncology

## 2020-09-15 DIAGNOSIS — Z510 Encounter for antineoplastic radiation therapy: Secondary | ICD-10-CM | POA: Diagnosis not present

## 2020-09-16 ENCOUNTER — Ambulatory Visit
Admission: RE | Admit: 2020-09-16 | Discharge: 2020-09-16 | Disposition: A | Discharge status: Home / Self Care | Payer: Medicare Other | Source: Ambulatory Visit | Attending: Radiation Oncology | Admitting: Radiation Oncology

## 2020-09-16 DIAGNOSIS — Z510 Encounter for antineoplastic radiation therapy: Secondary | ICD-10-CM | POA: Diagnosis not present

## 2020-09-17 ENCOUNTER — Inpatient Hospital Stay: Payer: Medicare Other

## 2020-09-17 ENCOUNTER — Encounter: Payer: Self-pay | Admitting: Internal Medicine

## 2020-09-17 ENCOUNTER — Ambulatory Visit
Admission: RE | Admit: 2020-09-17 | Discharge: 2020-09-17 | Disposition: A | Discharge status: Home / Self Care | Payer: Medicare Other | Source: Ambulatory Visit | Attending: Radiation Oncology | Admitting: Radiation Oncology

## 2020-09-17 ENCOUNTER — Other Ambulatory Visit: Payer: Self-pay | Admitting: *Deleted

## 2020-09-17 ENCOUNTER — Inpatient Hospital Stay (HOSPITAL_BASED_OUTPATIENT_CLINIC_OR_DEPARTMENT_OTHER): Payer: Medicare Other | Admitting: Internal Medicine

## 2020-09-17 ENCOUNTER — Other Ambulatory Visit: Payer: Self-pay

## 2020-09-17 DIAGNOSIS — C3432 Malignant neoplasm of lower lobe, left bronchus or lung: Secondary | ICD-10-CM

## 2020-09-17 DIAGNOSIS — Z5111 Encounter for antineoplastic chemotherapy: Secondary | ICD-10-CM | POA: Diagnosis not present

## 2020-09-17 DIAGNOSIS — Z510 Encounter for antineoplastic radiation therapy: Secondary | ICD-10-CM | POA: Diagnosis not present

## 2020-09-17 LAB — CBC WITH DIFFERENTIAL/PLATELET
Abs Immature Granulocytes: 0.01 10*3/uL (ref 0.00–0.07)
Basophils Absolute: 0 10*3/uL (ref 0.0–0.1)
Basophils Relative: 1 %
Eosinophils Absolute: 0 10*3/uL (ref 0.0–0.5)
Eosinophils Relative: 1 %
HCT: 38.7 % — ABNORMAL LOW (ref 39.0–52.0)
Hemoglobin: 13.3 g/dL (ref 13.0–17.0)
Immature Granulocytes: 0 %
Lymphocytes Relative: 30 %
Lymphs Abs: 1 10*3/uL (ref 0.7–4.0)
MCH: 32.6 pg (ref 26.0–34.0)
MCHC: 34.4 g/dL (ref 30.0–36.0)
MCV: 94.9 fL (ref 80.0–100.0)
Monocytes Absolute: 0.3 10*3/uL (ref 0.1–1.0)
Monocytes Relative: 9 %
Neutro Abs: 2 10*3/uL (ref 1.7–7.7)
Neutrophils Relative %: 59 %
Platelets: 226 10*3/uL (ref 150–400)
RBC: 4.08 MIL/uL — ABNORMAL LOW (ref 4.22–5.81)
RDW: 12.6 % (ref 11.5–15.5)
WBC: 3.3 10*3/uL — ABNORMAL LOW (ref 4.0–10.5)
nRBC: 0 % (ref 0.0–0.2)

## 2020-09-17 LAB — COMPREHENSIVE METABOLIC PANEL
ALT: 22 U/L (ref 0–44)
AST: 27 U/L (ref 15–41)
Albumin: 4.1 g/dL (ref 3.5–5.0)
Alkaline Phosphatase: 99 U/L (ref 38–126)
Anion gap: 9 (ref 5–15)
BUN: 18 mg/dL (ref 8–23)
CO2: 24 mmol/L (ref 22–32)
Calcium: 8.8 mg/dL — ABNORMAL LOW (ref 8.9–10.3)
Chloride: 103 mmol/L (ref 98–111)
Creatinine, Ser: 1.01 mg/dL (ref 0.61–1.24)
GFR, Estimated: >60 mL/min (ref >60)
Glucose, Bld: 141 mg/dL — ABNORMAL HIGH (ref 70–99)
Potassium: 4.1 mmol/L (ref 3.5–5.1)
Sodium: 136 mmol/L (ref 135–145)
Total Bilirubin: 1 mg/dL (ref 0.3–1.2)
Total Protein: 7.1 g/dL (ref 6.5–8.1)

## 2020-09-17 MED ORDER — SODIUM CHLORIDE 0.9 % IV SOLN
20.0000 mg | Freq: Once | INTRAVENOUS | Status: AC
  Administered 2020-09-17: 11:00:00 20 mg via INTRAVENOUS
  Filled 2020-09-17: qty 2

## 2020-09-17 MED ORDER — DIPHENHYDRAMINE HCL 50 MG/ML IJ SOLN
50.0000 mg | Freq: Once | INTRAMUSCULAR | Status: AC
  Administered 2020-09-17: 10:00:00 50 mg via INTRAVENOUS
  Filled 2020-09-17: qty 1

## 2020-09-17 MED ORDER — HEPARIN SOD (PORK) LOCK FLUSH 100 UNIT/ML IV SOLN
INTRAVENOUS | Status: AC
  Filled 2020-09-17: qty 5

## 2020-09-17 MED ORDER — PALONOSETRON HCL INJECTION 0.25 MG/5ML
0.2500 mg | Freq: Once | INTRAVENOUS | Status: AC
  Administered 2020-09-17: 10:00:00 0.25 mg via INTRAVENOUS
  Filled 2020-09-17: qty 5

## 2020-09-17 MED ORDER — SODIUM CHLORIDE 0.9 % IV SOLN
45.0000 mg/m2 | Freq: Once | INTRAVENOUS | Status: AC
  Administered 2020-09-17: 11:00:00 90 mg via INTRAVENOUS
  Filled 2020-09-17: qty 15

## 2020-09-17 MED ORDER — FAMOTIDINE IN NACL 20-0.9 MG/50ML-% IV SOLN
20.0000 mg | Freq: Once | INTRAVENOUS | Status: AC
  Administered 2020-09-17: 10:00:00 20 mg via INTRAVENOUS
  Filled 2020-09-17: qty 50

## 2020-09-17 MED ORDER — SODIUM CHLORIDE 0.9% FLUSH
10.0000 mL | Freq: Once | INTRAVENOUS | Status: AC
  Administered 2020-09-17: 09:00:00 10 mL via INTRAVENOUS
  Filled 2020-09-17: qty 10

## 2020-09-17 MED ORDER — SODIUM CHLORIDE 0.9 % IV SOLN
154.4000 mg | Freq: Once | INTRAVENOUS | Status: AC
  Administered 2020-09-17: 12:00:00 150 mg via INTRAVENOUS
  Filled 2020-09-17: qty 15

## 2020-09-17 MED ORDER — SODIUM CHLORIDE 0.9 % IV SOLN
Freq: Once | INTRAVENOUS | Status: AC
  Filled 2020-09-17: qty 250

## 2020-09-17 MED ORDER — SODIUM CHLORIDE 0.9% FLUSH
10.0000 mL | INTRAVENOUS | Status: DC | PRN
  Administered 2020-09-17: 10:00:00 10 mL
  Filled 2020-09-17: qty 10

## 2020-09-17 MED ORDER — HEPARIN SOD (PORK) LOCK FLUSH 100 UNIT/ML IV SOLN
500.0000 [IU] | Freq: Once | INTRAVENOUS | Status: AC | PRN
  Administered 2020-09-17: 13:00:00 500 [IU]
  Filled 2020-09-17: qty 5

## 2020-09-17 NOTE — Progress Notes (Signed)
Tolerated paclitaxel and carboplatin well. Discharged home in stable condition.

## 2020-09-17 NOTE — Progress Notes (Signed)
[facility] NOTE  Patient Care Team: Baxter Hire, MD as PCP - General (Internal Medicine) [name], RN as Oncology Nurse Navigator [name], MD as Consulting Physician (Hematology and Oncology)  CHIEF COMPLAINTS/PURPOSE OF CONSULTATION: lung nodule/mass  #  Oncology History Overview Note  # NOV 2021- LEFT LOWER LOBE NON-SMALL CELL CA [favor adeno ca]; T2N3 [right hilar; subcarinal LN;[name]; NOV 2021-MRI Brain-NEG  # 11/30- carbo-Taxol-RT [RT until 10/23/20]   # A.fib [Eliquis; Dr.Kowalski]  # # SURVIVORSHIP:   # GENETICS:   #NGS-ordered  DIAGNOSIS: Lung cancer  STAGE:   III      ;  GOALS:  cure  CURRENT/MOST RECENT THERAPY : carbo-Taxol-RT ]   Cancer of lower lobe of left lung ([place])  08/14/2020 Initial Diagnosis   Cancer of lower lobe of left lung ([place])   09/02/2020 -  Chemotherapy   The patient had dexamethasone (DECADRON) 4 MG tablet, 8 mg, Oral, Daily, 1 of 1 cycle, Start date: --, End date: -- palonosetron (ALOXI) injection 0.25 mg, 0.25 mg, Intravenous,  Once, 3 of 8 cycles Administration: 0.25 mg (09/02/2020), 0.25 mg (09/10/2020), 0.25 mg (09/17/2020) CARBOplatin (PARAPLATIN) 150 mg in sodium chloride 0.9 % 250 mL chemo infusion, 150 mg (100 % of original dose 149.4 mg), Intravenous,  Once, 3 of 8 cycles Dose modification:   (original dose 149.4 mg, Cycle 1),   (original dose 155.4 mg, Cycle 5) Administration: 150 mg (09/02/2020), 150 mg (09/10/2020), 150 mg (09/17/2020) PACLitaxel (TAXOL) 90 mg in sodium chloride 0.9 % 250 mL chemo infusion (</= 80mg /m2), 45 mg/m2 = 90 mg, Intravenous,  Once, 3 of 8 cycles Administration: 90 mg (09/02/2020), 90 mg (09/10/2020), 90 mg (09/17/2020)  for chemotherapy treatment.       HISTORY OF PRESENTING ILLNESS:  Jason Warren 86 y.o.  male patient with stage III lung non-small cell lung-currently on concurrent chemoradiation for follow-up.  Patient denies any difficulty  swallowing.  Denies any nausea vomiting.  Appetite is good no weight loss.  No difficulty in breathing.  Mild to moderate fatigue.  Review of Systems  Constitutional: Negative for chills, diaphoresis, fever, malaise/fatigue and weight loss.  HENT: Negative for nosebleeds and sore throat.   Eyes: Negative for double vision.  Respiratory: Positive for cough. Negative for hemoptysis, sputum production, shortness of breath and wheezing.   Cardiovascular: Negative for chest pain, palpitations, orthopnea and leg swelling.  Gastrointestinal: Negative for abdominal pain, blood in stool, constipation, diarrhea, heartburn, melena, nausea and vomiting.  Genitourinary: Negative for dysuria, frequency and urgency.  Musculoskeletal: Negative for back pain and joint pain.  Skin: Negative.  Negative for itching and rash.  Neurological: Positive for dizziness. Negative for tingling, focal weakness, weakness and headaches.  Endo/Heme/Allergies: Does not bruise/bleed easily.  Psychiatric/Behavioral: Negative for depression. The patient is not nervous/anxious and does not have insomnia.      MEDICAL HISTORY:  Past Medical History:  Diagnosis Date  . Arthritis   . Dysrhythmia    A-fib  . Hip pain    left  . HOH (hard of hearing)   . LBBB (left bundle branch block) 08/05/2020  . Skin cancer, basal cell     face top of head    SURGICAL HISTORY: Past Surgical History:  Procedure Laterality Date  . CATARACT EXTRACTION W/PHACO Right 07/13/2017   Procedure: CATARACT EXTRACTION PHACO AND INTRAOCULAR LENS PLACEMENT ([place]);  Surgeon: Leandrew Koyanagi, MD;  Location: [facility];  Service: Ophthalmology;  Laterality: Right;  IVA  TOPICAL RIGHT  . CATARACT EXTRACTION W/PHACO Left 01/04/2018   Procedure: CATARACT EXTRACTION PHACO AND INTRAOCULAR LENS PLACEMENT ([facility]) LEFT;  Surgeon: Leandrew Koyanagi, MD;  Location: [facility];  Service: Ophthalmology;  Laterality: Left;  . COLONOSCOPY    .  IR IMAGING GUIDED PORT INSERTION  08/27/2020  . JOINT REPLACEMENT     Left total hip Dr. Su Hoff 08-04-18  . ROTATOR CUFF REPAIR Right   . SKIN CANCER EXCISION     face  . TOTAL HIP ARTHROPLASTY Left 08/04/2018   Procedure: TOTAL HIP ARTHROPLASTY ANTERIOR APPROACH;  Surgeon: Frederik Pear, MD;  Location: [facility];  Service: Orthopedics;  Laterality: Left;  Marland Kitchen VIDEO BRONCHOSCOPY WITH ENDOBRONCHIAL NAVIGATION N/A 08/07/2020   Procedure: VIDEO BRONCHOSCOPY WITH ENDOBRONCHIAL NAVIGATION;  Surgeon: Ottie Glazier, MD;  Location: [facility];  Service: Thoracic;  Laterality: N/A;  . VIDEO BRONCHOSCOPY WITH ENDOBRONCHIAL ULTRASOUND N/A 08/07/2020   Procedure: VIDEO BRONCHOSCOPY WITH ENDOBRONCHIAL ULTRASOUND;  Surgeon: Ottie Glazier, MD;  Location: [facility];  Service: Thoracic;  Laterality: N/A;    SOCIAL HISTORY: Social History   Socioeconomic History  . Marital status: Married    Spouse name: Not on file  . Number of children: Not on file  . Years of education: Not on file  . Highest education level: Not on file  Occupational History  . Not on file  Tobacco Use  . Smoking status: Former Smoker    Packs/day: 1.00    Years: 4.00    Pack years: 4.00    Types: Cigarettes  . Smokeless tobacco: Never Used  . Tobacco comment: quit early 70's  Vaping Use  . Vaping Use: Never used  Substance and Sexual Activity  . Alcohol use: No  . Drug use: No  . Sexual activity: Not Currently  Other Topics Concern  . Not on file  Social History Narrative   > quit 35 years; smoked for 15 years. Rare alcohol. 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    Social Determinants of Health   Financial Resource Strain: Not on file  Food Insecurity: Not on file  Transportation Needs: Not on file  Physical Activity: Not on file  Stress: Not on file  Social Connections: Not on file  Intimate Partner Violence: Not on file    FAMILY  HISTORY: Family History  Problem Relation Age of Onset  . Throat cancer Brother         & lung cancer    ALLERGIES:  has No Known Allergies.  MEDICATIONS:  Current Outpatient Medications  Medication Sig Dispense Refill  . acetaminophen (TYLENOL) 500 MG tablet Take 500 mg by mouth every 8 (eight) hours as needed for moderate pain.     Marland Kitchen apixaban (ELIQUIS) 5 MG TABS tablet Take 5 mg by mouth 2 (two) times daily.    . hydroxypropyl methylcellulose / hypromellose (ISOPTO TEARS / GONIOVISC) 2.5 % ophthalmic solution Place 1 drop into the left eye daily as needed for dry eyes.    Marland Kitchen lidocaine-prilocaine (EMLA) cream Apply 1 application topically as needed. Apply small amount to port site at least 1 hour prior to it being accessed, cover with plastic wrap 30 g 1  . Multiple Vitamin (MULTIVITAMIN) tablet Take 1 tablet by mouth daily.    Vladimir Faster Glycol-Propyl Glycol (SYSTANE FREE OP) Apply to eye.    . pravastatin (PRAVACHOL) 80 MG tablet Take 80 mg by mouth at bedtime.     Marland Kitchen  ondansetron (ZOFRAN) 8 MG tablet One pill every 8 hours as needed for nausea/vomitting. (Patient not taking: No sig reported) 40 tablet 1  . prochlorperazine (COMPAZINE) 10 MG tablet Take 1 tablet (10 mg total) by mouth every 6 (six) hours as needed for nausea or vomiting. (Patient not taking: No sig reported) 40 tablet 1  . saw palmetto 160 MG capsule Take 160 mg by mouth 2 (two) times daily.     No current facility-administered medications for this visit.   PHYSICAL EXAMINATION: ECOG PERFORMANCE STATUS: 1 - Symptomatic but completely ambulatory  Vitals:   09/17/20 0935  BP: 124/69  Pulse: 67  Resp: 16  Temp: (!) 96.7 F (35.9 C)  SpO2: 100%   Filed Weights   09/17/20 0935  Weight: 165 lb (74.8 kg)    Physical Exam Constitutional:      Comments: Walking independently but with a limp because of left hip arthritis.  He is alone.  HENT:     Head: Normocephalic and atraumatic.     Mouth/Throat:      Pharynx: No oropharyngeal exudate.  Eyes:     Pupils: Pupils are equal, round, and reactive to light.  Cardiovascular:     Rate and Rhythm: Normal rate and regular rhythm.  Pulmonary:     Effort: Pulmonary effort is normal. No respiratory distress.     Breath sounds: Normal breath sounds. No wheezing.  Abdominal:     General: Bowel sounds are normal. There is no distension.     Palpations: Abdomen is soft. There is no mass.     Tenderness: There is no abdominal tenderness. There is no guarding or rebound.  Musculoskeletal:        General: No tenderness. Normal range of motion.     Cervical back: Normal range of motion and neck supple.  Skin:    General: Skin is warm.  Neurological:     Mental Status: He is alert and oriented to person, place, and time.  Psychiatric:        Mood and Affect: Affect normal.      LABORATORY DATA:  I have reviewed the data as listed Lab Results  Component Value Date   WBC 3.3 (L) 09/17/2020   HGB 13.3 09/17/2020   HCT 38.7 (L) 09/17/2020   MCV 94.9 09/17/2020   PLT 226 09/17/2020   Recent Labs    09/02/20 0846 09/10/20 0936 09/17/20 0856  NA 136 134* 136  K 4.1 4.3 4.1  CL 102 102 103  CO2 24 23 24   GLUCOSE 145* 94 141*  BUN 24* 22 18  CREATININE 1.06 0.97 1.01  CALCIUM 9.0 8.7* 8.8*  GFRNONAA >60 >60 >60  PROT 7.4 6.9 7.1  ALBUMIN 4.0 4.0 4.1  AST 26 24 27   ALT 18 19 22   ALKPHOS 106 97 99  BILITOT 0.8 1.0 1.0    RADIOGRAPHIC STUDIES: I have personally reviewed the radiological images as listed and agreed with the findings in the report. IR IMAGING GUIDED PORT INSERTION  Result Date: 08/27/2020 INDICATION: 86 year old male with left lower lobe non-small cell lung cancer. He requires durable venous access for chemotherapy. EXAM: IMPLANTED PORT A CATH PLACEMENT WITH ULTRASOUND AND FLUOROSCOPIC GUIDANCE MEDICATIONS: 2 g Ancef; The antibiotic was administered within an appropriate time interval prior to skin puncture.  ANESTHESIA/SEDATION: Versed 1 mg IV; Fentanyl 25 mcg IV; Moderate Sedation Time:  15 minutes The patient was continuously monitored during the procedure by the interventional radiology nurse under my direct supervision.  FLUOROSCOPY TIME:  0 minutes, 48 seconds (8.59 mGy) COMPLICATIONS: None immediate. PROCEDURE: The right neck and chest was prepped with chlorhexidine, and draped in the usual sterile fashion using maximum barrier technique (cap and mask, sterile gown, sterile gloves, large sterile sheet, hand hygiene and cutaneous antiseptic). Local anesthesia was attained by infiltration with 1% lidocaine with epinephrine. Ultrasound demonstrated patency of the right internal jugular vein, and this was documented with an image. Under real-time ultrasound guidance, this vein was accessed with a 21 gauge micropuncture needle and image documentation was performed. A small dermatotomy was made at the access site with an 11 scalpel. A 0.018" wire was advanced into the SVC and the access needle exchanged for a 42F micropuncture vascular sheath. The 0.018" wire was then removed and a 0.035" wire advanced into the IVC. An appropriate location for the subcutaneous reservoir was selected below the clavicle and an incision was made through the skin and underlying soft tissues. The subcutaneous tissues were then dissected using a combination of blunt and sharp surgical technique and a pocket was formed. A single lumen power injectable portacatheter was then tunneled through the subcutaneous tissues from the pocket to the dermatotomy and the port reservoir placed within the subcutaneous pocket. The venous access site was then serially dilated and a peel away vascular sheath placed over the wire. The wire was removed and the port catheter advanced into position under fluoroscopic guidance. The catheter tip is positioned in the superior cavoatrial junction. This was documented with a spot image. The portacatheter was then tested  and found to flush and aspirate well. The port was flushed with saline followed by 100 units/mL heparinized saline. The pocket was then closed in two layers using first subdermal inverted interrupted absorbable sutures followed by a running subcuticular suture. The epidermis was then sealed with Dermabond. The dermatotomy at the venous access site was also closed with Dermabond. IMPRESSION: Successful placement of a right IJ approach Power Port with ultrasound and fluoroscopic guidance. The catheter is ready for use. Electronically Signed   By: Jacqulynn Cadet M.D.   On: 08/27/2020 10:47    ASSESSMENT & PLAN:   Cancer of lower lobe of left lung ([place]) # T2N3- Stage III-non-small cell lung cancer-[favor adenocarcinoma]- plan concurrent chemoradiation weekly carbotaxol with the daily radiation.[start 11/30]; table.  #Proceed with cycle #3 of carbotaxol; Labs today reviewed;  acceptable for treatment today ;RT- started onDecember 1]. Proceed with chemo today.   #History of A. fib on Eliquis. STABLE.   # DISPOSITION; # chemo today # in 1 week as planned # follow up in 2 weeks-X- MD; labs- cbc/cmp; carbo-taxol- Dr.B  All questions were answered. The patient knows to call the clinic with any problems, questions or concerns.   [name], MD 09/23/2020 11:32 PM

## 2020-09-17 NOTE — Assessment & Plan Note (Addendum)
#   T2N3- Stage III-non-small cell lung cancer-[favor adenocarcinoma]- plan concurrent chemoradiation weekly carbotaxol with the daily radiation.[start 11/30]; table.  #Proceed with cycle #3 of carbotaxol; Labs today reviewed;  acceptable for treatment today ;RT- started onDecember 1]. Proceed with chemo today.   #History of A. fib on Eliquis. STABLE.   # DISPOSITION; # chemo today # in 1 week as planned # follow up in 2 weeks-X- MD; labs- cbc/cmp; carbo-taxol- Dr.B

## 2020-09-18 ENCOUNTER — Ambulatory Visit
Admission: RE | Admit: 2020-09-18 | Discharge: 2020-09-18 | Disposition: A | Discharge status: Home / Self Care | Payer: Medicare Other | Source: Ambulatory Visit | Attending: Radiation Oncology | Admitting: Radiation Oncology

## 2020-09-18 DIAGNOSIS — Z510 Encounter for antineoplastic radiation therapy: Secondary | ICD-10-CM | POA: Diagnosis not present

## 2020-09-19 ENCOUNTER — Ambulatory Visit
Admission: RE | Admit: 2020-09-19 | Discharge: 2020-09-19 | Disposition: A | Discharge status: Home / Self Care | Payer: Medicare Other | Source: Ambulatory Visit | Attending: Radiation Oncology | Admitting: Radiation Oncology

## 2020-09-19 DIAGNOSIS — Z510 Encounter for antineoplastic radiation therapy: Secondary | ICD-10-CM | POA: Diagnosis not present

## 2020-09-22 ENCOUNTER — Ambulatory Visit
Admission: RE | Admit: 2020-09-22 | Discharge: 2020-09-22 | Disposition: A | Discharge status: Home / Self Care | Payer: Medicare Other | Source: Ambulatory Visit | Attending: Radiation Oncology | Admitting: Radiation Oncology

## 2020-09-22 DIAGNOSIS — Z510 Encounter for antineoplastic radiation therapy: Secondary | ICD-10-CM | POA: Diagnosis not present

## 2020-09-23 ENCOUNTER — Ambulatory Visit
Admission: RE | Admit: 2020-09-23 | Discharge: 2020-09-23 | Disposition: A | Discharge status: Home / Self Care | Payer: Medicare Other | Source: Ambulatory Visit | Attending: Radiation Oncology | Admitting: Radiation Oncology

## 2020-09-23 DIAGNOSIS — Z510 Encounter for antineoplastic radiation therapy: Secondary | ICD-10-CM | POA: Diagnosis not present

## 2020-09-24 ENCOUNTER — Ambulatory Visit
Admission: RE | Admit: 2020-09-24 | Discharge: 2020-09-24 | Disposition: A | Discharge status: Home / Self Care | Payer: Medicare Other | Source: Ambulatory Visit | Attending: Radiation Oncology | Admitting: Radiation Oncology

## 2020-09-24 ENCOUNTER — Inpatient Hospital Stay: Payer: Medicare Other

## 2020-09-24 ENCOUNTER — Inpatient Hospital Stay (HOSPITAL_BASED_OUTPATIENT_CLINIC_OR_DEPARTMENT_OTHER): Payer: Medicare Other | Admitting: Internal Medicine

## 2020-09-24 ENCOUNTER — Other Ambulatory Visit: Payer: Self-pay

## 2020-09-24 ENCOUNTER — Encounter: Payer: Self-pay | Admitting: Internal Medicine

## 2020-09-24 DIAGNOSIS — Z5111 Encounter for antineoplastic chemotherapy: Secondary | ICD-10-CM | POA: Diagnosis not present

## 2020-09-24 DIAGNOSIS — C3432 Malignant neoplasm of lower lobe, left bronchus or lung: Secondary | ICD-10-CM

## 2020-09-24 DIAGNOSIS — Z510 Encounter for antineoplastic radiation therapy: Secondary | ICD-10-CM | POA: Diagnosis not present

## 2020-09-24 LAB — CBC WITH DIFFERENTIAL/PLATELET
Abs Immature Granulocytes: 0.02 10*3/uL (ref 0.00–0.07)
Basophils Absolute: 0 10*3/uL (ref 0.0–0.1)
Basophils Relative: 1 %
Eosinophils Absolute: 0 10*3/uL (ref 0.0–0.5)
Eosinophils Relative: 1 %
HCT: 38 % — ABNORMAL LOW (ref 39.0–52.0)
Hemoglobin: 13.1 g/dL (ref 13.0–17.0)
Immature Granulocytes: 1 %
Lymphocytes Relative: 24 %
Lymphs Abs: 0.7 10*3/uL (ref 0.7–4.0)
MCH: 32.6 pg (ref 26.0–34.0)
MCHC: 34.5 g/dL (ref 30.0–36.0)
MCV: 94.5 fL (ref 80.0–100.0)
Monocytes Absolute: 0.3 10*3/uL (ref 0.1–1.0)
Monocytes Relative: 12 %
Neutro Abs: 1.7 10*3/uL (ref 1.7–7.7)
Neutrophils Relative %: 61 %
Platelets: 203 10*3/uL (ref 150–400)
RBC: 4.02 MIL/uL — ABNORMAL LOW (ref 4.22–5.81)
RDW: 13.2 % (ref 11.5–15.5)
WBC: 2.8 10*3/uL — ABNORMAL LOW (ref 4.0–10.5)
nRBC: 0 % (ref 0.0–0.2)

## 2020-09-24 LAB — COMPREHENSIVE METABOLIC PANEL
ALT: 19 U/L (ref 0–44)
AST: 26 U/L (ref 15–41)
Albumin: 4.1 g/dL (ref 3.5–5.0)
Alkaline Phosphatase: 88 U/L (ref 38–126)
Anion gap: 10 (ref 5–15)
BUN: 19 mg/dL (ref 8–23)
CO2: 23 mmol/L (ref 22–32)
Calcium: 8.6 mg/dL — ABNORMAL LOW (ref 8.9–10.3)
Chloride: 102 mmol/L (ref 98–111)
Creatinine, Ser: 0.95 mg/dL (ref 0.61–1.24)
GFR, Estimated: >60 mL/min (ref >60)
Glucose, Bld: 123 mg/dL — ABNORMAL HIGH (ref 70–99)
Potassium: 4.1 mmol/L (ref 3.5–5.1)
Sodium: 135 mmol/L (ref 135–145)
Total Bilirubin: 1 mg/dL (ref 0.3–1.2)
Total Protein: 7.1 g/dL (ref 6.5–8.1)

## 2020-09-24 MED ORDER — SODIUM CHLORIDE 0.9 % IV SOLN
20.0000 mg | Freq: Once | INTRAVENOUS | Status: AC
  Administered 2020-09-24: 11:00:00 20 mg via INTRAVENOUS
  Filled 2020-09-24: qty 20

## 2020-09-24 MED ORDER — DIPHENHYDRAMINE HCL 50 MG/ML IJ SOLN
50.0000 mg | Freq: Once | INTRAMUSCULAR | Status: AC
  Administered 2020-09-24: 11:00:00 50 mg via INTRAVENOUS
  Filled 2020-09-24: qty 1

## 2020-09-24 MED ORDER — SODIUM CHLORIDE 0.9 % IV SOLN
150.0000 mg | Freq: Once | INTRAVENOUS | Status: AC
  Administered 2020-09-24: 13:00:00 150 mg via INTRAVENOUS
  Filled 2020-09-24: qty 15

## 2020-09-24 MED ORDER — SODIUM CHLORIDE 0.9 % IV SOLN
45.0000 mg/m2 | Freq: Once | INTRAVENOUS | Status: AC
  Administered 2020-09-24: 12:00:00 90 mg via INTRAVENOUS
  Filled 2020-09-24: qty 15

## 2020-09-24 MED ORDER — FAMOTIDINE IN NACL 20-0.9 MG/50ML-% IV SOLN
20.0000 mg | Freq: Once | INTRAVENOUS | Status: AC
  Administered 2020-09-24: 11:00:00 20 mg via INTRAVENOUS
  Filled 2020-09-24: qty 50

## 2020-09-24 MED ORDER — SODIUM CHLORIDE 0.9% FLUSH
10.0000 mL | Freq: Once | INTRAVENOUS | Status: AC
  Administered 2020-09-24: 09:00:00 10 mL via INTRAVENOUS
  Filled 2020-09-24: qty 10

## 2020-09-24 MED ORDER — PALONOSETRON HCL INJECTION 0.25 MG/5ML
0.2500 mg | Freq: Once | INTRAVENOUS | Status: AC
  Administered 2020-09-24: 11:00:00 0.25 mg via INTRAVENOUS
  Filled 2020-09-24: qty 5

## 2020-09-24 MED ORDER — HEPARIN SOD (PORK) LOCK FLUSH 100 UNIT/ML IV SOLN
INTRAVENOUS | Status: AC
  Filled 2020-09-24: qty 5

## 2020-09-24 MED ORDER — HEPARIN SOD (PORK) LOCK FLUSH 100 UNIT/ML IV SOLN
500.0000 [IU] | Freq: Once | INTRAVENOUS | Status: AC | PRN
Start: 2020-09-24 — End: 2020-09-24
  Administered 2020-09-24: 14:00:00 500 [IU]
  Filled 2020-09-24: qty 5

## 2020-09-24 MED ORDER — SODIUM CHLORIDE 0.9 % IV SOLN
Freq: Once | INTRAVENOUS | Status: AC
  Filled 2020-09-24: qty 250

## 2020-09-24 MED ORDER — SUCRALFATE 1 G PO TABS: 1.0000 g | ORAL_TABLET | Freq: Three times a day (TID) | ORAL | 1 refills | Status: DC

## 2020-09-24 NOTE — Progress Notes (Signed)
Rash left arm and back x5 days. Using cortisone cream to help with itching.

## 2020-09-24 NOTE — Progress Notes (Signed)
[facility] NOTE  Patient Care Team: Baxter Hire, MD as PCP - General (Internal Medicine) [name], RN as Oncology Nurse Navigator [name], MD as Consulting Physician (Hematology and Oncology)  CHIEF COMPLAINTS/PURPOSE OF CONSULTATION: lung nodule/mass  #  Oncology History Overview Note  # NOV 2021- LEFT LOWER LOBE NON-SMALL CELL CA [favor adeno ca]; T2N3 [right hilar; subcarinal LN;[name]; NOV 2021-MRI Brain-NEG  # 11/30- carbo-Taxol-RT [RT until 10/23/20]   # A.fib [Eliquis; Dr.Kowalski]  # # SURVIVORSHIP:   # GENETICS:   #NGS-ordered  DIAGNOSIS: Lung cancer  STAGE:   III      ;  GOALS:  cure  CURRENT/MOST RECENT THERAPY : carbo-Taxol-RT ]   Cancer of lower lobe of left lung ([place])  08/14/2020 Initial Diagnosis   Cancer of lower lobe of left lung ([place])   09/02/2020 -  Chemotherapy   The patient had dexamethasone (DECADRON) 4 MG tablet, 8 mg, Oral, Daily, 1 of 1 cycle, Start date: --, End date: -- palonosetron (ALOXI) injection 0.25 mg, 0.25 mg, Intravenous,  Once, 4 of 8 cycles Administration: 0.25 mg (09/02/2020), 0.25 mg (09/10/2020), 0.25 mg (09/17/2020), 0.25 mg (09/24/2020) CARBOplatin (PARAPLATIN) 150 mg in sodium chloride 0.9 % 250 mL chemo infusion, 150 mg (100 % of original dose 149.4 mg), Intravenous,  Once, 4 of 8 cycles Dose modification:   (original dose 149.4 mg, Cycle 1),   (original dose 155.4 mg, Cycle 5) Administration: 150 mg (09/02/2020), 150 mg (09/10/2020), 150 mg (09/17/2020), 150 mg (09/24/2020) PACLitaxel (TAXOL) 90 mg in sodium chloride 0.9 % 250 mL chemo infusion (</= 80mg /m2), 45 mg/m2 = 90 mg, Intravenous,  Once, 4 of 8 cycles Administration: 90 mg (09/02/2020), 90 mg (09/10/2020), 90 mg (09/17/2020), 90 mg (09/24/2020)  for chemotherapy treatment.       HISTORY OF PRESENTING ILLNESS:  Jason Warren 86 y.o.  male patient with stage III lung non-small cell lung-currently on concurrent  chemoradiation for follow-up.  Patient complains of mild difficulty swallowing.  No nausea or vomiting.  Complains of rash on his back at the site of radiation portal.  No cough.  No difficulty in breathing.  Mild fatigue.  Review of Systems  Constitutional: Negative for chills, diaphoresis, fever, malaise/fatigue and weight loss.  HENT: Negative for nosebleeds and sore throat.   Eyes: Negative for double vision.  Respiratory: Positive for cough. Negative for hemoptysis, sputum production, shortness of breath and wheezing.   Cardiovascular: Negative for chest pain, palpitations, orthopnea and leg swelling.  Gastrointestinal: Negative for abdominal pain, blood in stool, constipation, diarrhea, heartburn, melena, nausea and vomiting.  Genitourinary: Negative for dysuria, frequency and urgency.  Musculoskeletal: Negative for back pain and joint pain.  Skin: Positive for rash. Negative for itching.  Neurological: Negative for tingling, focal weakness, weakness and headaches.  Endo/Heme/Allergies: Does not bruise/bleed easily.  Psychiatric/Behavioral: Negative for depression. The patient is not nervous/anxious and does not have insomnia.      MEDICAL HISTORY:  Past Medical History:  Diagnosis Date  . Arthritis   . Dysrhythmia    A-fib  . Hip pain    left  . HOH (hard of hearing)   . LBBB (left bundle branch block) 08/05/2020  . Skin cancer, basal cell     face top of head    SURGICAL HISTORY: Past Surgical History:  Procedure Laterality Date  . CATARACT EXTRACTION W/PHACO Right 07/13/2017   Procedure: CATARACT EXTRACTION PHACO AND INTRAOCULAR LENS PLACEMENT ([place]);  Surgeon: Leandrew Koyanagi, MD;  Location: [facility];  Service: Ophthalmology;  Laterality: Right;  IVA TOPICAL RIGHT  . CATARACT EXTRACTION W/PHACO Left 01/04/2018   Procedure: CATARACT EXTRACTION PHACO AND INTRAOCULAR LENS PLACEMENT ([place]) LEFT;  Surgeon: Leandrew Koyanagi, MD;  Location: [facility];  Service: Ophthalmology;  Laterality: Left;  . COLONOSCOPY    . IR IMAGING GUIDED PORT INSERTION  08/27/2020  . JOINT REPLACEMENT     Left total hip Dr. Su Hoff 08-04-18  . ROTATOR CUFF REPAIR Right   . SKIN CANCER EXCISION     face  . TOTAL HIP ARTHROPLASTY Left 08/04/2018   Procedure: TOTAL HIP ARTHROPLASTY ANTERIOR APPROACH;  Surgeon: Frederik Pear, MD;  Location: [facility];  Service: Orthopedics;  Laterality: Left;  Marland Kitchen VIDEO BRONCHOSCOPY WITH ENDOBRONCHIAL NAVIGATION N/A 08/07/2020   Procedure: VIDEO BRONCHOSCOPY WITH ENDOBRONCHIAL NAVIGATION;  Surgeon: Ottie Glazier, MD;  Location: [facility];  Service: Thoracic;  Laterality: N/A;  . VIDEO BRONCHOSCOPY WITH ENDOBRONCHIAL ULTRASOUND N/A 08/07/2020   Procedure: VIDEO BRONCHOSCOPY WITH ENDOBRONCHIAL ULTRASOUND;  Surgeon: Ottie Glazier, MD;  Location: [facility];  Service: Thoracic;  Laterality: N/A;    SOCIAL HISTORY: Social History   Socioeconomic History  . Marital status: Married    Spouse name: Not on file  . Number of children: Not on file  . Years of education: Not on file  . Highest education level: Not on file  Occupational History  . Not on file  Tobacco Use  . Smoking status: Former Smoker    Packs/day: 1.00    Years: 4.00    Pack years: 4.00    Types: Cigarettes  . Smokeless tobacco: Never Used  . Tobacco comment: quit early 70's  Vaping Use  . Vaping Use: Never used  Substance and Sexual Activity  . Alcohol use: No  . Drug use: No  . Sexual activity: Not Currently  Other Topics Concern  . Not on file  Social History Narrative   > quit 35 years; smoked for 15 years. Rare alcohol. 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    Social Determinants of Health   Financial Resource Strain: Not on file  Food Insecurity: Not on file  Transportation Needs: Not on file  Physical Activity: Not on file  Stress: Not on file  Social Connections: Not on  file  Intimate Partner Violence: Not on file    FAMILY HISTORY: Family History  Problem Relation Age of Onset  . Throat cancer Brother         & lung cancer    ALLERGIES:  has No Known Allergies.  MEDICATIONS:  Current Outpatient Medications  Medication Sig Dispense Refill  . acetaminophen (TYLENOL) 500 MG tablet Take 500 mg by mouth every 8 (eight) hours as needed for moderate pain.     Marland Kitchen apixaban (ELIQUIS) 5 MG TABS tablet Take 5 mg by mouth 2 (two) times daily.    . hydroxypropyl methylcellulose / hypromellose (ISOPTO TEARS / GONIOVISC) 2.5 % ophthalmic solution Place 1 drop into the left eye daily as needed for dry eyes.    Marland Kitchen lidocaine-prilocaine (EMLA) cream Apply 1 application topically as needed. Apply small amount to port site at least 1 hour prior to it being accessed, cover with plastic wrap 30 g 1  . Multiple Vitamin (MULTIVITAMIN) tablet Take 1 tablet by mouth daily.    . ondansetron (ZOFRAN) 8 MG tablet One pill every 8 hours as needed for nausea/vomitting. [place]  tablet 1  . Polyethyl Glycol-Propyl Glycol (SYSTANE FREE OP) Apply to eye.    . pravastatin (PRAVACHOL) 80 MG tablet Take 80 mg by mouth at bedtime.     . prochlorperazine (COMPAZINE) 10 MG tablet Take 1 tablet (10 mg total) by mouth every 6 (six) hours as needed for nausea or vomiting. 40 tablet 1  . saw palmetto 160 MG capsule Take 160 mg by mouth 2 (two) times daily.    . sucralfate (CARAFATE) 1 g tablet Take 1 tablet (1 g total) by mouth 4 (four) times daily -  with meals and at bedtime. 90 tablet 1   No current facility-administered medications for this visit.   PHYSICAL EXAMINATION: ECOG PERFORMANCE STATUS: 1 - Symptomatic but completely ambulatory  Vitals:   09/24/20 0937  BP: 132/68  Pulse: 68  Resp: 16  Temp: 98 F (36.7 C)  SpO2: 99%   Filed Weights   09/24/20 0937  Weight: 166 lb (75.3 kg)    Physical Exam Constitutional:      Comments: Walking independently but with a limp because of  left hip arthritis.  He is alone.  HENT:     Head: Normocephalic and atraumatic.     Mouth/Throat:     Pharynx: No oropharyngeal exudate.  Eyes:     Pupils: Pupils are equal, round, and reactive to light.  Cardiovascular:     Rate and Rhythm: Normal rate and regular rhythm.  Pulmonary:     Effort: Pulmonary effort is normal. No respiratory distress.     Breath sounds: Normal breath sounds. No wheezing.  Abdominal:     General: Bowel sounds are normal. There is no distension.     Palpations: Abdomen is soft. There is no mass.     Tenderness: There is no abdominal tenderness. There is no guarding or rebound.  Musculoskeletal:        General: No tenderness. Normal range of motion.     Cervical back: Normal range of motion and neck supple.  Skin:    General: Skin is warm.     Comments: Rash noted on the back.   Neurological:     Mental Status: He is alert and oriented to person, place, and time.  Psychiatric:        Mood and Affect: Affect normal.      LABORATORY DATA:  I have reviewed the data as listed Lab Results  Component Value Date   WBC 2.8 (L) 09/24/2020   HGB 13.1 09/24/2020   HCT 38.0 (L) 09/24/2020   MCV 94.5 09/24/2020   PLT 203 09/24/2020   Recent Labs    09/10/20 0936 09/17/20 0856 09/24/20 0839  NA 134* 136 135  K 4.3 4.1 4.1  CL 102 103 102  CO2 23 24 23   GLUCOSE 94 141* 123*  BUN 22 18 19   CREATININE 0.97 1.01 0.95  CALCIUM 8.7* 8.8* 8.6*  GFRNONAA >60 >60 >60  PROT 6.9 7.1 7.1  ALBUMIN 4.0 4.1 4.1  AST 24 27 26   ALT 19 22 19   ALKPHOS 97 99 88  BILITOT 1.0 1.0 1.0    RADIOGRAPHIC STUDIES: I have personally reviewed the radiological images as listed and agreed with the findings in the report. IR IMAGING GUIDED PORT INSERTION  Result Date: 08/27/2020 INDICATION: 86 year old male with left lower lobe non-small cell lung cancer. He requires durable venous access for chemotherapy. EXAM: IMPLANTED PORT A CATH PLACEMENT WITH ULTRASOUND AND  FLUOROSCOPIC GUIDANCE MEDICATIONS: 2 g Ancef; The antibiotic was  administered within an appropriate time interval prior to skin puncture. ANESTHESIA/SEDATION: Versed 1 mg IV; Fentanyl 25 mcg IV; Moderate Sedation Time:  15 minutes The patient was continuously monitored during the procedure by the interventional radiology nurse under my direct supervision. FLUOROSCOPY TIME:  0 minutes, 48 seconds (0.62 mGy) COMPLICATIONS: None immediate. PROCEDURE: The right neck and chest was prepped with chlorhexidine, and draped in the usual sterile fashion using maximum barrier technique (cap and mask, sterile gown, sterile gloves, large sterile sheet, hand hygiene and cutaneous antiseptic). Local anesthesia was attained by infiltration with 1% lidocaine with epinephrine. Ultrasound demonstrated patency of the right internal jugular vein, and this was documented with an image. Under real-time ultrasound guidance, this vein was accessed with a 21 gauge micropuncture needle and image documentation was performed. A small dermatotomy was made at the access site with an 11 scalpel. A 0.018" wire was advanced into the SVC and the access needle exchanged for a 81F micropuncture vascular sheath. The 0.018" wire was then removed and a 0.035" wire advanced into the IVC. An appropriate location for the subcutaneous reservoir was selected below the clavicle and an incision was made through the skin and underlying soft tissues. The subcutaneous tissues were then dissected using a combination of blunt and sharp surgical technique and a pocket was formed. A single lumen power injectable portacatheter was then tunneled through the subcutaneous tissues from the pocket to the dermatotomy and the port reservoir placed within the subcutaneous pocket. The venous access site was then serially dilated and a peel away vascular sheath placed over the wire. The wire was removed and the port catheter advanced into position under fluoroscopic guidance. The  catheter tip is positioned in the superior cavoatrial junction. This was documented with a spot image. The portacatheter was then tested and found to flush and aspirate well. The port was flushed with saline followed by 100 units/mL heparinized saline. The pocket was then closed in two layers using first subdermal inverted interrupted absorbable sutures followed by a running subcuticular suture. The epidermis was then sealed with Dermabond. The dermatotomy at the venous access site was also closed with Dermabond. IMPRESSION: Successful placement of a right IJ approach Power Port with ultrasound and fluoroscopic guidance. The catheter is ready for use. Electronically Signed   By: Jacqulynn Cadet M.D.   On: 08/27/2020 10:47    ASSESSMENT & PLAN:   Cancer of lower lobe of left lung ([place]) # T2N3- Stage III-non-small cell lung cancer-[favor adenocarcinoma]- plan concurrent chemoradiation weekly carbotaxol with the daily radiation.[start 11/30]; table.  #Proceed with cycle #4 of carbotaxol; Labs today reviewed;  acceptable for treatment today ;RT- started on December 1]. Proceed with chemo today.   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   # Radiation dermatitis- G-1 ; on 1% hydrocortsione  #History of A. fib on Eliquis. STABLE.   # DISPOSITION; # chemo today # in 1 week as planned # follow up in 2 weeks-X- MD/Dr.YU pref; labs- cbc/cmp; carbo-taxol- # # follow up in 3 weeks-MD; labs- cbc/cmp; carbo-taxol- Dr.B  All questions were answered. The patient knows to call the clinic with any problems, questions or concerns.   [name], MD 09/24/2020 7:21 PM

## 2020-09-24 NOTE — Assessment & Plan Note (Addendum)
#   T2N3- Stage III-non-small cell lung cancer-[favor adenocarcinoma]- plan concurrent chemoradiation weekly carbotaxol with the daily radiation.[start 11/30]; table.  #Proceed with cycle #4 of carbotaxol; Labs today reviewed;  acceptable for treatment today ;RT- started on December 1]. Proceed with chemo today.   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   # Radiation dermatitis- G-1 ; on 1% hydrocortsione  #History of A. fib on Eliquis. STABLE.   # DISPOSITION; # chemo today # in 1 week as planned # follow up in 2 weeks-X- MD/Dr.YU pref; labs- cbc/cmp; carbo-taxol- # # follow up in 3 weeks-MD; labs- cbc/cmp; carbo-taxol- Dr.B

## 2020-09-24 NOTE — Progress Notes (Signed)
Jason Warren tolerated his treatment well today without any complications.

## 2020-09-25 ENCOUNTER — Ambulatory Visit
Admission: RE | Admit: 2020-09-25 | Discharge: 2020-09-25 | Disposition: A | Discharge status: Home / Self Care | Payer: Medicare Other | Source: Ambulatory Visit | Attending: Radiation Oncology | Admitting: Radiation Oncology

## 2020-09-25 DIAGNOSIS — Z510 Encounter for antineoplastic radiation therapy: Secondary | ICD-10-CM | POA: Diagnosis not present

## 2020-09-29 ENCOUNTER — Ambulatory Visit
Admission: RE | Admit: 2020-09-29 | Discharge: 2020-09-29 | Disposition: A | Discharge status: Home / Self Care | Payer: Medicare Other | Source: Ambulatory Visit | Attending: Radiation Oncology | Admitting: Radiation Oncology

## 2020-09-29 DIAGNOSIS — Z510 Encounter for antineoplastic radiation therapy: Secondary | ICD-10-CM | POA: Diagnosis not present

## 2020-09-30 ENCOUNTER — Ambulatory Visit
Admission: RE | Admit: 2020-09-30 | Discharge: 2020-09-30 | Disposition: A | Discharge status: Home / Self Care | Payer: Medicare Other | Source: Ambulatory Visit | Attending: Radiation Oncology | Admitting: Radiation Oncology

## 2020-09-30 ENCOUNTER — Other Ambulatory Visit: Payer: Self-pay | Admitting: *Deleted

## 2020-09-30 DIAGNOSIS — Z510 Encounter for antineoplastic radiation therapy: Secondary | ICD-10-CM | POA: Diagnosis not present

## 2020-09-30 MED ORDER — SILVER SULFADIAZINE 1 % EX CREA: 1.0000 "application " | TOPICAL_CREAM | Freq: Two times a day (BID) | CUTANEOUS | 2 refills | Status: DC

## 2020-10-01 ENCOUNTER — Inpatient Hospital Stay: Payer: Medicare Other

## 2020-10-01 ENCOUNTER — Other Ambulatory Visit: Payer: Self-pay

## 2020-10-01 ENCOUNTER — Encounter: Payer: Self-pay | Admitting: Oncology

## 2020-10-01 ENCOUNTER — Ambulatory Visit
Admission: RE | Admit: 2020-10-01 | Discharge: 2020-10-01 | Disposition: A | Discharge status: Home / Self Care | Payer: Medicare Other | Source: Ambulatory Visit | Attending: Radiation Oncology | Admitting: Radiation Oncology

## 2020-10-01 ENCOUNTER — Inpatient Hospital Stay (HOSPITAL_BASED_OUTPATIENT_CLINIC_OR_DEPARTMENT_OTHER): Payer: Medicare Other | Admitting: Oncology

## 2020-10-01 VITALS — BP 128/70 | HR 64 | Resp 18

## 2020-10-01 VITALS — BP 129/63 | HR 69 | Temp 98.0°F | Wt 168.7 lb

## 2020-10-01 DIAGNOSIS — L589 Radiodermatitis, unspecified: Secondary | ICD-10-CM

## 2020-10-01 DIAGNOSIS — C3432 Malignant neoplasm of lower lobe, left bronchus or lung: Secondary | ICD-10-CM

## 2020-10-01 DIAGNOSIS — Z5111 Encounter for antineoplastic chemotherapy: Secondary | ICD-10-CM | POA: Diagnosis not present

## 2020-10-01 DIAGNOSIS — T66XXXA Radiation sickness, unspecified, initial encounter: Secondary | ICD-10-CM

## 2020-10-01 DIAGNOSIS — K208 Other esophagitis without bleeding: Secondary | ICD-10-CM | POA: Diagnosis not present

## 2020-10-01 DIAGNOSIS — Z510 Encounter for antineoplastic radiation therapy: Secondary | ICD-10-CM | POA: Diagnosis not present

## 2020-10-01 LAB — CBC WITH DIFFERENTIAL/PLATELET
Abs Immature Granulocytes: 0.02 10*3/uL (ref 0.00–0.07)
Basophils Absolute: 0 10*3/uL (ref 0.0–0.1)
Basophils Relative: 1 %
Eosinophils Absolute: 0 10*3/uL (ref 0.0–0.5)
Eosinophils Relative: 1 %
HCT: 36.1 % — ABNORMAL LOW (ref 39.0–52.0)
Hemoglobin: 12.5 g/dL — ABNORMAL LOW (ref 13.0–17.0)
Immature Granulocytes: 1 %
Lymphocytes Relative: 17 %
Lymphs Abs: 0.5 10*3/uL — ABNORMAL LOW (ref 0.7–4.0)
MCH: 32.8 pg (ref 26.0–34.0)
MCHC: 34.6 g/dL (ref 30.0–36.0)
MCV: 94.8 fL (ref 80.0–100.0)
Monocytes Absolute: 0.4 10*3/uL (ref 0.1–1.0)
Monocytes Relative: 13 %
Neutro Abs: 2.1 10*3/uL (ref 1.7–7.7)
Neutrophils Relative %: 67 %
Platelets: 173 10*3/uL (ref 150–400)
RBC: 3.81 MIL/uL — ABNORMAL LOW (ref 4.22–5.81)
RDW: 13.7 % (ref 11.5–15.5)
WBC: 3.1 10*3/uL — ABNORMAL LOW (ref 4.0–10.5)
nRBC: 0 % (ref 0.0–0.2)

## 2020-10-01 LAB — COMPREHENSIVE METABOLIC PANEL
ALT: 20 U/L (ref 0–44)
AST: 25 U/L (ref 15–41)
Albumin: 3.8 g/dL (ref 3.5–5.0)
Alkaline Phosphatase: 88 U/L (ref 38–126)
Anion gap: 8 (ref 5–15)
BUN: 16 mg/dL (ref 8–23)
CO2: 25 mmol/L (ref 22–32)
Calcium: 8.6 mg/dL — ABNORMAL LOW (ref 8.9–10.3)
Chloride: 104 mmol/L (ref 98–111)
Creatinine, Ser: 0.81 mg/dL (ref 0.61–1.24)
GFR, Estimated: >60 mL/min (ref >60)
Glucose, Bld: 113 mg/dL — ABNORMAL HIGH (ref 70–99)
Potassium: 4 mmol/L (ref 3.5–5.1)
Sodium: 137 mmol/L (ref 135–145)
Total Bilirubin: 0.7 mg/dL (ref 0.3–1.2)
Total Protein: 6.7 g/dL (ref 6.5–8.1)

## 2020-10-01 MED ORDER — PALONOSETRON HCL INJECTION 0.25 MG/5ML
0.2500 mg | Freq: Once | INTRAVENOUS | Status: AC
  Administered 2020-10-01: 11:00:00 0.25 mg via INTRAVENOUS
  Filled 2020-10-01: qty 5

## 2020-10-01 MED ORDER — HEPARIN SOD (PORK) LOCK FLUSH 100 UNIT/ML IV SOLN
INTRAVENOUS | Status: AC
  Filled 2020-10-01: qty 5

## 2020-10-01 MED ORDER — FAMOTIDINE IN NACL 20-0.9 MG/50ML-% IV SOLN
20.0000 mg | Freq: Once | INTRAVENOUS | Status: AC
  Administered 2020-10-01: 11:00:00 20 mg via INTRAVENOUS
  Filled 2020-10-01: qty 50

## 2020-10-01 MED ORDER — SODIUM CHLORIDE 0.9% FLUSH
10.0000 mL | Freq: Once | INTRAVENOUS | Status: AC
  Administered 2020-10-01: 09:00:00 10 mL via INTRAVENOUS
  Filled 2020-10-01: qty 10

## 2020-10-01 MED ORDER — SODIUM CHLORIDE 0.9 % IV SOLN
45.0000 mg/m2 | Freq: Once | INTRAVENOUS | Status: AC
  Administered 2020-10-01: 11:00:00 90 mg via INTRAVENOUS
  Filled 2020-10-01: qty 15

## 2020-10-01 MED ORDER — HEPARIN SOD (PORK) LOCK FLUSH 100 UNIT/ML IV SOLN
500.0000 [IU] | Freq: Once | INTRAVENOUS | Status: AC
  Administered 2020-10-01: 13:00:00 500 [IU] via INTRAVENOUS
  Filled 2020-10-01: qty 5

## 2020-10-01 MED ORDER — DIPHENHYDRAMINE HCL 50 MG/ML IJ SOLN
50.0000 mg | Freq: Once | INTRAMUSCULAR | Status: AC
  Administered 2020-10-01: 11:00:00 50 mg via INTRAVENOUS
  Filled 2020-10-01: qty 1

## 2020-10-01 MED ORDER — SODIUM CHLORIDE 0.9% FLUSH
10.0000 mL | INTRAVENOUS | Status: DC | PRN
  Administered 2020-10-01: 11:00:00 10 mL
  Filled 2020-10-01: qty 10

## 2020-10-01 MED ORDER — SODIUM CHLORIDE 0.9 % IV SOLN
20.0000 mg | Freq: Once | INTRAVENOUS | Status: AC
  Administered 2020-10-01: 11:00:00 20 mg via INTRAVENOUS
  Filled 2020-10-01: qty 20

## 2020-10-01 MED ORDER — SODIUM CHLORIDE 0.9 % IV SOLN
150.0000 mg | Freq: Once | INTRAVENOUS | Status: AC
Start: 2020-10-01 — End: 2020-10-01
  Administered 2020-10-01: 12:00:00 150 mg via INTRAVENOUS
  Filled 2020-10-01: qty 15

## 2020-10-01 MED ORDER — SODIUM CHLORIDE 0.9 % IV SOLN
Freq: Once | INTRAVENOUS | Status: AC
  Filled 2020-10-01: qty 250

## 2020-10-01 MED ORDER — HEPARIN SOD (PORK) LOCK FLUSH 100 UNIT/ML IV SOLN
500.0000 [IU] | Freq: Once | INTRAVENOUS | Status: DC | PRN
  Filled 2020-10-01: qty 5

## 2020-10-01 NOTE — Progress Notes (Signed)
Tolerated paclitaxel and carboplatin. Discharged home in stable condition.

## 2020-10-01 NOTE — Progress Notes (Signed)
Rash on back is not improving but the itch was relieved by cortisone cream.  Dr. Baruch Gouty prescribed Silvadene cream yesterday and has used 1 application.

## 2020-10-01 NOTE — Progress Notes (Signed)
[facility] CONSULT NOTE  Patient Care Team: Baxter Hire, MD as PCP - General (Internal Medicine) [name], RN as Oncology Nurse Navigator [name], MD as Consulting Physician (Hematology and Oncology)  CHIEF COMPLAINTS/PURPOSE OF CONSULTATION: lung nodule/mass  #  Oncology History Overview Note  # NOV 2021- LEFT LOWER LOBE NON-SMALL CELL CA [favor adeno ca]; T2N3 [right hilar; subcarinal LN;[name]; NOV 2021-MRI Brain-NEG  # 11/30- carbo-Taxol-RT [RT until 10/23/20]   # A.fib [Eliquis; Dr.Kowalski]  # # SURVIVORSHIP:   # GENETICS:   #NGS-ordered  DIAGNOSIS: Lung cancer  STAGE:   III      ;  GOALS:  cure  CURRENT/MOST RECENT THERAPY : carbo-Taxol-RT ]   Cancer of lower lobe of left lung ([place])  08/14/2020 Initial Diagnosis   Cancer of lower lobe of left lung ([place])   09/02/2020 -  Chemotherapy   The patient had dexamethasone (DECADRON) 4 MG tablet, 8 mg, Oral, Daily, 1 of 1 cycle, Start date: --, End date: -- palonosetron (ALOXI) injection 0.25 mg, 0.25 mg, Intravenous,  Once, 5 of 8 cycles Administration: 0.25 mg (09/02/2020), 0.25 mg (10/01/2020), 0.25 mg (09/10/2020), 0.25 mg (09/17/2020), 0.25 mg (09/24/2020) CARBOplatin (PARAPLATIN) 150 mg in sodium chloride 0.9 % 250 mL chemo infusion, 150 mg (100 % of original dose 149.4 mg), Intravenous,  Once, 5 of 8 cycles Dose modification:   (original dose 149.4 mg, Cycle 1),   (original dose 155.4 mg, Cycle 5) Administration: 150 mg (09/02/2020), 150 mg (10/01/2020), 150 mg (09/10/2020), 150 mg (09/17/2020), 150 mg (09/24/2020) PACLitaxel (TAXOL) 90 mg in sodium chloride 0.9 % 250 mL chemo infusion (</= 80mg /m2), 45 mg/m2 = 90 mg, Intravenous,  Once, 5 of 8 cycles Administration: 90 mg (09/02/2020), 90 mg (10/01/2020), 90 mg (09/10/2020), 90 mg (09/17/2020), 90 mg (09/24/2020)  for chemotherapy treatment.       HISTORY OF PRESENTING ILLNESS:  Jason Warren 86 y.o.  male patient with  stage III lung non-small cell lung-currently on concurrent chemoradiation for follow-up.  Patient follows up with Dr.Brahmanday who is off today, I am covering him to see this patient. He has a rash on his back.  Dr. Cheron Schaumann has prescribed him Silvadene cream which she has only tried once. Some dysphagia.  He takes sucralfate 1 g 3 times a day.  He reports the symptoms are manageable. Denies any nausea, vomiting, diarrhea, shortness of breath. Chronic intermittent cough, unchanged. Review of Systems  Constitutional: Negative for chills, diaphoresis, fever, malaise/fatigue and weight loss.  HENT: Negative for nosebleeds and sore throat.   Eyes: Negative for double vision.  Respiratory: Positive for cough. Negative for hemoptysis, sputum production, shortness of breath and wheezing.   Cardiovascular: Negative for chest pain, palpitations, orthopnea and leg swelling.  Gastrointestinal: Negative for abdominal pain, blood in stool, constipation, diarrhea, heartburn, melena, nausea and vomiting.  Genitourinary: Negative for dysuria, frequency and urgency.  Musculoskeletal: Negative for back pain and joint pain.  Skin: Positive for rash. Negative for itching.  Neurological: Negative for tingling, focal weakness, weakness and headaches.  Endo/Heme/Allergies: Does not bruise/bleed easily.  Psychiatric/Behavioral: Negative for depression. The patient is not nervous/anxious and does not have insomnia.      MEDICAL HISTORY:  Past Medical History:  Diagnosis Date  . Arthritis   . Dysrhythmia    A-fib  . Hip pain    left  . HOH (hard of hearing)   . LBBB (left bundle branch block) 08/05/2020  . Skin cancer, basal cell  face top of head    SURGICAL HISTORY: Past Surgical History:  Procedure Laterality Date  . CATARACT EXTRACTION W/PHACO Right 07/13/2017   Procedure: CATARACT EXTRACTION PHACO AND INTRAOCULAR LENS PLACEMENT ([place]);  Surgeon: Leandrew Koyanagi, MD;  Location: [facility];  Service: Ophthalmology;  Laterality: Right;  IVA TOPICAL RIGHT  . CATARACT EXTRACTION W/PHACO Left 01/04/2018   Procedure: CATARACT EXTRACTION PHACO AND INTRAOCULAR LENS PLACEMENT ([place]) LEFT;  Surgeon: Leandrew Koyanagi, MD;  Location: [facility];  Service: Ophthalmology;  Laterality: Left;  . COLONOSCOPY    . IR IMAGING GUIDED PORT INSERTION  08/27/2020  . JOINT REPLACEMENT     Left total hip Dr. Su Hoff 08-04-18  . ROTATOR CUFF REPAIR Right   . SKIN CANCER EXCISION     face  . TOTAL HIP ARTHROPLASTY Left 08/04/2018   Procedure: TOTAL HIP ARTHROPLASTY ANTERIOR APPROACH;  Surgeon: Frederik Pear, MD;  Location: [facility];  Service: Orthopedics;  Laterality: Left;  Marland Kitchen VIDEO BRONCHOSCOPY WITH ENDOBRONCHIAL NAVIGATION N/A 08/07/2020   Procedure: VIDEO BRONCHOSCOPY WITH ENDOBRONCHIAL NAVIGATION;  Surgeon: Ottie Glazier, MD;  Location: [facility];  Service: Thoracic;  Laterality: N/A;  . VIDEO BRONCHOSCOPY WITH ENDOBRONCHIAL ULTRASOUND N/A 08/07/2020   Procedure: VIDEO BRONCHOSCOPY WITH ENDOBRONCHIAL ULTRASOUND;  Surgeon: Ottie Glazier, MD;  Location: [facility];  Service: Thoracic;  Laterality: N/A;    SOCIAL HISTORY: Social History   Socioeconomic History  . Marital status: Married    Spouse name: Not on file  . Number of children: Not on file  . Years of education: Not on file  . Highest education level: Not on file  Occupational History  . Not on file  Tobacco Use  . Smoking status: Former Smoker    Packs/day: 1.00    Years: 4.00    Pack years: 4.00    Types: Cigarettes  . Smokeless tobacco: Never Used  . Tobacco comment: quit early 70's  Vaping Use  . Vaping Use: Never used  Substance and Sexual Activity  . Alcohol use: No  . Drug use: No  . Sexual activity: Not Currently  Other Topics Concern  . Not on file  Social History Narrative   > quit 35 years; smoked for 15 years. Rare alcohol. 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    Social Determinants of Health   Financial Resource Strain: Not on file  Food Insecurity: Not on file  Transportation Needs: Not on file  Physical Activity: Not on file  Stress: Not on file  Social Connections: Not on file  Intimate Partner Violence: Not on file    FAMILY HISTORY: Family History  Problem Relation Age of Onset  . Throat cancer Brother         & lung cancer    ALLERGIES:  has No Known Allergies.  MEDICATIONS:  Current Outpatient Medications  Medication Sig Dispense Refill  . acetaminophen (TYLENOL) 500 MG tablet Take 500 mg by mouth every 8 (eight) hours as needed for moderate pain.     Marland Kitchen apixaban (ELIQUIS) 5 MG TABS tablet Take 5 mg by mouth 2 (two) times daily.    . hydroxypropyl methylcellulose / hypromellose (ISOPTO TEARS / GONIOVISC) 2.5 % ophthalmic solution Place 1 drop into the left eye daily as needed for dry eyes.    Marland Kitchen lidocaine-prilocaine (EMLA) cream Apply 1 application topically as needed. Apply small amount to port site at least 1 hour prior to it  being accessed, cover with plastic wrap 30 g 1  . Multiple Vitamin (MULTIVITAMIN) tablet Take 1 tablet by mouth daily.    Vladimir Faster Glycol-Propyl Glycol (SYSTANE FREE OP) Apply to eye.    . pravastatin (PRAVACHOL) 80 MG tablet Take 80 mg by mouth at bedtime.     . saw palmetto 160 MG capsule Take 160 mg by mouth 2 (two) times daily.    . silver sulfADIAZINE (SILVADENE) 1 % cream Apply 1 application topically 2 (two) times daily. 85 g 2  . sucralfate (CARAFATE) 1 g tablet Take 1 tablet (1 g total) by mouth 4 (four) times daily -  with meals and at bedtime. 90 tablet 1  . ondansetron (ZOFRAN) 8 MG tablet One pill every 8 hours as needed for nausea/vomitting. (Patient not taking: Reported on 10/01/2020) 40 tablet 1  . prochlorperazine (COMPAZINE) 10 MG tablet Take 1 tablet (10 mg total) by mouth every 6 (six) hours as needed for nausea or vomiting. (Patient not taking:  Reported on 10/01/2020) 40 tablet 1   No current facility-administered medications for this visit.   Facility-Administered Medications Ordered in Other Visits  Medication Dose Route Frequency Provider Last Rate Last Admin  . heparin lock flush 100 unit/mL  500 Units Intracatheter Once PRN Earlie Server, MD      . sodium chloride flush (NS) 0.9 % injection 10 mL  10 mL Intracatheter PRN Earlie Server, MD   10 mL at 10/01/20 1030   PHYSICAL EXAMINATION: ECOG PERFORMANCE STATUS: 1 - Symptomatic but completely ambulatory  Vitals:   10/01/20 0933  BP: 129/63  Pulse: 69  Temp: 98 F (36.7 C)  SpO2: 97%   Filed Weights   10/01/20 0933  Weight: 168 lb 11.2 oz (76.5 kg)    Physical Exam Constitutional:      Comments: Walking independently but with a limp because of left hip arthritis.  He is alone.  HENT:     Head: Normocephalic and atraumatic.     Mouth/Throat:     Pharynx: No oropharyngeal exudate.  Eyes:     Pupils: Pupils are equal, round, and reactive to light.  Cardiovascular:     Rate and Rhythm: Normal rate and regular rhythm.  Pulmonary:     Effort: Pulmonary effort is normal. No respiratory distress.     Breath sounds: Normal breath sounds. No wheezing.  Abdominal:     General: Bowel sounds are normal. There is no distension.     Palpations: Abdomen is soft. There is no mass.     Tenderness: There is no abdominal tenderness. There is no guarding or rebound.  Musculoskeletal:        General: No tenderness. Normal range of motion.     Cervical back: Normal range of motion and neck supple.  Skin:    General: Skin is warm.     Comments: Rash noted on the back.   Neurological:     Mental Status: He is alert and oriented to person, place, and time.  Psychiatric:        Mood and Affect: Affect normal.        LABORATORY DATA:  I have reviewed the data as listed Lab Results  Component Value Date   WBC 3.1 (L) 10/01/2020   HGB 12.5 (L) 10/01/2020   HCT 36.1 (L)  10/01/2020   MCV 94.8 10/01/2020   PLT 173 10/01/2020   Recent Labs    09/17/20 0856 09/24/20 0839 10/01/20 0835  NA 136 135 137  K 4.1 4.1 4.0  CL 103 102 104  CO2 24 23 25   GLUCOSE 141* 123* 113*  BUN 18 19 16   CREATININE 1.01 0.95 0.81  CALCIUM 8.8* 8.6* 8.6*  GFRNONAA >60 >60 >60  PROT 7.1 7.1 6.7  ALBUMIN 4.1 4.1 3.8  AST 27 26 25   ALT 22 19 20   ALKPHOS 99 88 88  BILITOT 1.0 1.0 0.7    RADIOGRAPHIC STUDIES: I have personally reviewed the radiological images as listed and agreed with the findings in the report. CT CHEST W CONTRAST  Result Date: 07/18/2020 CLINICAL DATA:  Left hilar airspace opacity on chest radiography EXAM: CT CHEST WITH CONTRAST TECHNIQUE: Multidetector CT imaging of the chest was performed during intravenous contrast administration. CONTRAST:  36mL OMNIPAQUE IOHEXOL 300 MG/ML  SOLN COMPARISON:  CT abdomen 12/24/2019 FINDINGS: Cardiovascular: Heart size normal. No pericardial effusion. Dilated central pulmonary arteries suggesting pulmonary arterial hypertension. Coronary calcifications. Atheromatous aortic arch and descending thoracic aorta. Mediastinum/Nodes: 1.6 cm right hilar lymph node. 1 cm left hilar lymph node. 1.6 cm subcarinal adenopathy. Subcentimeter anterior mediastinal, prevascular, AP window, and right paratracheal lymph nodes. Lungs/Pleura: No pleural effusion. No pneumothorax. 3.8 cm subpleural mass in the superior segment left lower lobe. 3.3 cm masslike opacity in the superior segment right lower lobe with some adjacent bronchiectasis and pleural retraction. Lungs otherwise clear. Upper Abdomen: 1.2 cm probable cyst in left hepatic lobe, stable. Stable 2.6 cm probable cyst, upper pole right kidney. No adrenal mass or other acute finding. Musculoskeletal: Stable T12 and L1 compression deformities. No acute fracture or worrisome bone lesion. IMPRESSION: 1. 3.8 cm subpleural mass in the superior segment left lower lobe, and 3.3 cm masslike opacity  in the superior segment right lower lobe, with bilateral hilar and mediastinal adenopathy. Findings may represent primary bronchogenic carcinoma versus metastatic disease. PET-CT may be useful for further characterization. 2. Coronary and Aortic Atherosclerosis (ICD10-I70.0). Electronically Signed   By: Lucrezia Europe M.D.   On: 07/18/2020 10:27   MR Brain W Wo Contrast  Result Date: 08/19/2020 CLINICAL DATA:  Lung cancer, staging EXAM: MRI HEAD WITHOUT AND WITH CONTRAST TECHNIQUE: Multiplanar, multiecho pulse sequences of the brain and surrounding structures were obtained without and with intravenous contrast. CONTRAST:  7.5 mL Gadavist COMPARISON:  None. FINDINGS: Brain: There is no acute infarction or intracranial hemorrhage. There is no intracranial mass, mass effect, or edema. There is no hydrocephalus or extra-axial fluid collection. Prominence of the ventricles and sulci reflects mild generalized parenchymal volume loss. Patchy areas of T2 hyperintensity in the supratentorial white matter are nonspecific but may reflect mild to moderate microvascular ischemic changes. No abnormal enhancement. Vascular: Major vessel flow voids at the skull base are preserved. Skull and upper cervical spine: Normal marrow signal is preserved. Sinuses/Orbits: Paranasal sinuses are aerated. Orbits are unremarkable. Other: Sella is unremarkable.  Mastoid air cells are clear. IMPRESSION: No evidence of intracranial metastatic disease. Electronically Signed   By: Macy Mis M.D.   On: 08/19/2020 15:22   NM PET Image Initial (PI) Skull Base To Thigh  Result Date: 07/22/2020 CLINICAL DATA:  Initial treatment strategy for left lung mass. EXAM: NUCLEAR MEDICINE PET SKULL BASE TO THIGH TECHNIQUE: 9.03 mCi F-18 FDG was injected intravenously. Full-ring PET imaging was performed from the skull base to thigh after the radiotracer. CT data was obtained and used for attenuation correction and anatomic localization. Fasting blood  glucose: 80 mg/dl COMPARISON:  Chest CT 07/17/2020.  Abdominopelvic CT 12/24/2019 FINDINGS: Mediastinal blood pool  activity: SUV max 1.8 NECK: No hypermetabolic cervical lymph nodes are identified.There are no lesions of the pharyngeal mucosal space. Incidental CT findings: none CHEST: There are small hypermetabolic mediastinal and hilar lymph nodes. 1.4 cm subcarinal node on image 107/3 has an SUV max 6.3. A small left hilar node has an SUV max of 5.1. Right hilar node has an SUV max of 3.3. The 3.7 x 3.6 cm mass in the superior segment of the left lower lobe is markedly hypermetabolic with an SUV max of 10.8. The contralateral density in the superior segment of the right lower lobe which measures approximately 4.6 x 3.1 cm on image 112/3 demonstrates no significant hypermetabolic activity (SUV max 1.8). This is associated with volume loss and bronchiectasis and is favored to reflect postinflammatory scarring. No other suspicious pulmonary metabolic activity or nodularity. Incidental CT findings: Atherosclerosis of the aorta, great vessels and coronary arteries. ABDOMEN/PELVIS: There is no hypermetabolic activity within the liver, adrenal glands, spleen or pancreas. There is no hypermetabolic nodal activity. Incidental CT findings: Stable cysts in the left hepatic lobe and upper pole of the right kidney. Aortic and branch vessel atherosclerosis. Right-sided bladder diverticulum and mild enlargement of the prostate gland. SKELETON: There is no hypermetabolic activity to suggest osseous metastatic disease. Incidental CT findings: Previous left total hip arthroplasty. Chronic Schmorl's nodes and endplate compression deformities at T12 and L1. IMPRESSION: 1. The mass in the superior segment of the left lower lobe is hypermetabolic consistent with primary bronchogenic carcinoma. There are small mildly hypermetabolic mediastinal and hilar lymph nodes bilaterally. No distant metastases identified. By this examination,  findings reflect at least IIIA disease (T2aN2MX) and possibly IIIB disease (T2aN3MX). 2. The finding in the superior segment of the right lower lobe is not hypermetabolic and is favored to reflect chronic postinflammatory scarring with bronchiectasis. 3. No significant findings in the abdomen or pelvis. Electronically Signed   By: Richardean Sale M.D.   On: 07/22/2020 16:07   DG Chest Port 1 View  Result Date: 08/07/2020 CLINICAL DATA:  Status post bronchoscopy. EXAM: PORTABLE CHEST 1 VIEW COMPARISON:  August 04, 2020. FINDINGS: Left lower lobe mass is noted as described on prior CT scan. No pneumothorax or pleural effusion is noted. Right infrahilar opacity is noted concerning for scarring or bronchiectasis. Bony thorax is unremarkable. IMPRESSION: Left lower lobe mass is noted as described on prior CT scan. Right infrahilar opacity is noted concerning for scarring or bronchiectasis. No pneumothorax is noted. Aortic Atherosclerosis (ICD10-I70.0). Electronically Signed   By: Marijo Conception M.D.   On: 08/07/2020 10:22   DG C-Arm 1-60 Min-No Report  Result Date: 08/07/2020 Fluoroscopy was utilized by the requesting physician.  No radiographic interpretation.   IR IMAGING GUIDED PORT INSERTION  Result Date: 08/27/2020 INDICATION: 86 year old male with left lower lobe non-small cell lung cancer. He requires durable venous access for chemotherapy. EXAM: IMPLANTED PORT A CATH PLACEMENT WITH ULTRASOUND AND FLUOROSCOPIC GUIDANCE MEDICATIONS: 2 g Ancef; The antibiotic was administered within an appropriate time interval prior to skin puncture. ANESTHESIA/SEDATION: Versed 1 mg IV; Fentanyl 25 mcg IV; Moderate Sedation Time:  15 minutes The patient was continuously monitored during the procedure by the interventional radiology nurse under my direct supervision. FLUOROSCOPY TIME:  0 minutes, 48 seconds (5.39 mGy) COMPLICATIONS: None immediate. PROCEDURE: The right neck and chest was prepped with chlorhexidine,  and draped in the usual sterile fashion using maximum barrier technique (cap and mask, sterile gown, sterile gloves, large sterile sheet, hand hygiene and  cutaneous antiseptic). Local anesthesia was attained by infiltration with 1% lidocaine with epinephrine. Ultrasound demonstrated patency of the right internal jugular vein, and this was documented with an image. Under real-time ultrasound guidance, this vein was accessed with a 21 gauge micropuncture needle and image documentation was performed. A small dermatotomy was made at the access site with an 11 scalpel. A 0.018" wire was advanced into the SVC and the access needle exchanged for a 72F micropuncture vascular sheath. The 0.018" wire was then removed and a 0.035" wire advanced into the IVC. An appropriate location for the subcutaneous reservoir was selected below the clavicle and an incision was made through the skin and underlying soft tissues. The subcutaneous tissues were then dissected using a combination of blunt and sharp surgical technique and a pocket was formed. A single lumen power injectable portacatheter was then tunneled through the subcutaneous tissues from the pocket to the dermatotomy and the port reservoir placed within the subcutaneous pocket. The venous access site was then serially dilated and a peel away vascular sheath placed over the wire. The wire was removed and the port catheter advanced into position under fluoroscopic guidance. The catheter tip is positioned in the superior cavoatrial junction. This was documented with a spot image. The portacatheter was then tested and found to flush and aspirate well. The port was flushed with saline followed by 100 units/mL heparinized saline. The pocket was then closed in two layers using first subdermal inverted interrupted absorbable sutures followed by a running subcuticular suture. The epidermis was then sealed with Dermabond. The dermatotomy at the venous access site was also closed with  Dermabond. IMPRESSION: Successful placement of a right IJ approach Power Port with ultrasound and fluoroscopic guidance. The catheter is ready for use. Electronically Signed   By: Jacqulynn Cadet M.D.   On: 08/27/2020 10:47   CT Super D Chest Wo Contrast  Result Date: 08/04/2020 CLINICAL DATA:  Bronchoscopy scheduled 08/07/2020. Mediastinal pain, mid chest pain. EXAM: CT CHEST WITHOUT CONTRAST TECHNIQUE: Multidetector CT imaging of the chest was performed using thin slice collimation for electromagnetic bronchoscopy planning purposes, without intravenous contrast. COMPARISON:  PET 07/22/2020 and CT chest 07/17/2020. FINDINGS: Cardiovascular: Atherosclerotic calcification of the aorta, aortic valve and coronary arteries. Right and left pulmonary arteries and heart are enlarged. No pericardial effusion. Mediastinum/Nodes: Low left paratracheal lymph node measures 1.6 cm, stable. There are additional subcentimeter short axis mediastinal lymph nodes. Subcarinal lymph node measures 1.5 cm, as before. Hilar regions are difficult to evaluate without IV contrast. No axillary adenopathy. Esophagus is grossly unremarkable. Lungs/Pleura: A spiculated mass in the superior segment left lower lobe measures 3.3 x 3.7 cm. There is a short border of contact, and extensions to, the adjacent visceral pleural, with pleural thickening. An area of irregular soft tissue, architectural distortion and bronchiectasis in the superior segment right lower lobe measures 3.5 x 3.7 cm. Inferiorly, there is some associated ground-glass along the peribronchovascular interstitium. No pleural fluid. Airway is unremarkable. Upper Abdomen: 1.5 cm low-attenuation lesion in the dome of the liver is unchanged but too small to characterize. Visualized portions of the liver, gallbladder and adrenal glands are otherwise unremarkable. Low-attenuation lesions in the kidneys measure up to 2.7 cm on the right, the largest of which is likely a cyst. Others  are too small to characterize. Visualized portions of the kidneys, spleen, pancreas, stomach and bowel are otherwise grossly unremarkable. Musculoskeletal: Degenerative changes in the spine. No worrisome lytic or sclerotic lesions. T12 and L1 compression deformities  are unchanged. Old sternal fracture. IMPRESSION: 1. Spiculated mass in the superior segment left lower lobe with associated mediastinal adenopathy, characterized as at least stage IIIA primary bronchogenic carcinoma on 07/22/2020. 2. Confluent area of parenchymal retraction and bronchiectasis in the right lower lobe, not shown to be hypermetabolic on recent PET. Note is made of associated ground-glass along the peribronchovascular interstitium, inferior to this lesion. Adenocarcinoma cannot be excluded. Continued attention on follow-up exams is suggested. 3. Aortic atherosclerosis (ICD10-I70.0). Coronary artery calcification. 4. Enlarged pulmonary arteries, indicative of pulmonary arterial hypertension. Electronically Signed   By: Lorin Picket M.D.   On: 08/04/2020 13:47    ASSESSMENT & PLAN:  1. Cancer of lower lobe of left lung ([place])   2. Encounter for antineoplastic chemotherapy   3. Radiation esophagitis   4. Radiation dermatitis    #Left non-small cell lung cancer-favor adenocarcinoma, stage III, T2M3 Currently on concurrent chemo and radiation. He tolerates well with mild difficulties. Labs reviewed and discussed with patient. Counts acceptable to proceed with weekly cycle 5 carboplatin and Taxol.  #Radiation esophagitis, continue Carafate 1 g 3 times daily.  Discussed with patient about Magic mouthwash swish and swallow as needed odynophagia.  Patient declines.  Continue monitor symptoms.  #Radiation dermatitis, continue Silvadene cream. History of A. fib on Eliquis, stable.  Patient will follow up in 1 week lab MD chemotherapy  All questions were answered. The patient knows to call the clinic with any problems, questions  or concerns.   Earlie Server, MD 10/01/2020 3:41 PM

## 2020-10-02 ENCOUNTER — Ambulatory Visit
Admission: RE | Admit: 2020-10-02 | Discharge: 2020-10-02 | Disposition: A | Discharge status: Home / Self Care | Payer: Medicare Other | Source: Ambulatory Visit | Attending: Radiation Oncology | Admitting: Radiation Oncology

## 2020-10-02 DIAGNOSIS — Z510 Encounter for antineoplastic radiation therapy: Secondary | ICD-10-CM | POA: Diagnosis not present

## 2020-10-06 ENCOUNTER — Ambulatory Visit
Admission: RE | Admit: 2020-10-06 | Discharge: 2020-10-06 | Disposition: A | Discharge status: Home / Self Care | Payer: Medicare Other | Source: Ambulatory Visit | Attending: Radiation Oncology | Admitting: Radiation Oncology

## 2020-10-06 DIAGNOSIS — Z510 Encounter for antineoplastic radiation therapy: Secondary | ICD-10-CM | POA: Insufficient documentation

## 2020-10-06 DIAGNOSIS — C3432 Malignant neoplasm of lower lobe, left bronchus or lung: Secondary | ICD-10-CM | POA: Insufficient documentation

## 2020-10-07 ENCOUNTER — Ambulatory Visit
Admission: RE | Admit: 2020-10-07 | Discharge: 2020-10-07 | Disposition: A | Discharge status: Home / Self Care | Payer: Medicare Other | Source: Ambulatory Visit | Attending: Radiation Oncology | Admitting: Radiation Oncology

## 2020-10-07 DIAGNOSIS — Z510 Encounter for antineoplastic radiation therapy: Secondary | ICD-10-CM | POA: Diagnosis not present

## 2020-10-08 ENCOUNTER — Inpatient Hospital Stay: Payer: Medicare Other | Attending: Oncology

## 2020-10-08 ENCOUNTER — Ambulatory Visit
Admission: RE | Admit: 2020-10-08 | Discharge: 2020-10-08 | Disposition: A | Discharge status: Home / Self Care | Payer: Medicare Other | Source: Ambulatory Visit | Attending: Radiation Oncology | Admitting: Radiation Oncology

## 2020-10-08 ENCOUNTER — Inpatient Hospital Stay (HOSPITAL_BASED_OUTPATIENT_CLINIC_OR_DEPARTMENT_OTHER): Payer: Medicare Other | Admitting: Oncology

## 2020-10-08 ENCOUNTER — Other Ambulatory Visit: Payer: Self-pay

## 2020-10-08 ENCOUNTER — Inpatient Hospital Stay: Payer: Medicare Other

## 2020-10-08 ENCOUNTER — Encounter: Payer: Self-pay | Admitting: Oncology

## 2020-10-08 VITALS — BP 125/75 | HR 75 | Temp 97.3°F | Resp 18 | Wt 163.6 lb

## 2020-10-08 DIAGNOSIS — I4891 Unspecified atrial fibrillation: Secondary | ICD-10-CM | POA: Insufficient documentation

## 2020-10-08 DIAGNOSIS — L598 Other specified disorders of the skin and subcutaneous tissue related to radiation: Secondary | ICD-10-CM | POA: Insufficient documentation

## 2020-10-08 DIAGNOSIS — Z5111 Encounter for antineoplastic chemotherapy: Secondary | ICD-10-CM | POA: Insufficient documentation

## 2020-10-08 DIAGNOSIS — C3432 Malignant neoplasm of lower lobe, left bronchus or lung: Secondary | ICD-10-CM | POA: Diagnosis present

## 2020-10-08 DIAGNOSIS — L589 Radiodermatitis, unspecified: Secondary | ICD-10-CM | POA: Diagnosis not present

## 2020-10-08 DIAGNOSIS — K208 Other esophagitis without bleeding: Secondary | ICD-10-CM

## 2020-10-08 DIAGNOSIS — T66XXXA Radiation sickness, unspecified, initial encounter: Secondary | ICD-10-CM

## 2020-10-08 DIAGNOSIS — Z510 Encounter for antineoplastic radiation therapy: Secondary | ICD-10-CM | POA: Diagnosis not present

## 2020-10-08 LAB — CBC WITH DIFFERENTIAL/PLATELET
Abs Immature Granulocytes: 0.03 10*3/uL (ref 0.00–0.07)
Basophils Absolute: 0 10*3/uL (ref 0.0–0.1)
Basophils Relative: 0 %
Eosinophils Absolute: 0 10*3/uL (ref 0.0–0.5)
Eosinophils Relative: 1 %
HCT: 36.8 % — ABNORMAL LOW (ref 39.0–52.0)
Hemoglobin: 12.6 g/dL — ABNORMAL LOW (ref 13.0–17.0)
Immature Granulocytes: 1 %
Lymphocytes Relative: 15 %
Lymphs Abs: 0.5 10*3/uL — ABNORMAL LOW (ref 0.7–4.0)
MCH: 32.5 pg (ref 26.0–34.0)
MCHC: 34.2 g/dL (ref 30.0–36.0)
MCV: 94.8 fL (ref 80.0–100.0)
Monocytes Absolute: 0.4 10*3/uL (ref 0.1–1.0)
Monocytes Relative: 13 %
Neutro Abs: 2.1 10*3/uL (ref 1.7–7.7)
Neutrophils Relative %: 70 %
Platelets: 171 10*3/uL (ref 150–400)
RBC: 3.88 MIL/uL — ABNORMAL LOW (ref 4.22–5.81)
RDW: 14.1 % (ref 11.5–15.5)
WBC: 3.1 10*3/uL — ABNORMAL LOW (ref 4.0–10.5)
nRBC: 0 % (ref 0.0–0.2)

## 2020-10-08 LAB — COMPREHENSIVE METABOLIC PANEL
ALT: 20 U/L (ref 0–44)
AST: 26 U/L (ref 15–41)
Albumin: 3.8 g/dL (ref 3.5–5.0)
Alkaline Phosphatase: 97 U/L (ref 38–126)
Anion gap: 11 (ref 5–15)
BUN: 17 mg/dL (ref 8–23)
CO2: 21 mmol/L — ABNORMAL LOW (ref 22–32)
Calcium: 8.6 mg/dL — ABNORMAL LOW (ref 8.9–10.3)
Chloride: 104 mmol/L (ref 98–111)
Creatinine, Ser: 0.91 mg/dL (ref 0.61–1.24)
GFR, Estimated: >60 mL/min (ref >60)
Glucose, Bld: 145 mg/dL — ABNORMAL HIGH (ref 70–99)
Potassium: 4.1 mmol/L (ref 3.5–5.1)
Sodium: 136 mmol/L (ref 135–145)
Total Bilirubin: 0.9 mg/dL (ref 0.3–1.2)
Total Protein: 6.7 g/dL (ref 6.5–8.1)

## 2020-10-08 MED ORDER — SODIUM CHLORIDE 0.9 % IV SOLN
Freq: Once | INTRAVENOUS | Status: AC
  Filled 2020-10-08: qty 250

## 2020-10-08 MED ORDER — DIPHENHYDRAMINE HCL 50 MG/ML IJ SOLN
50.0000 mg | Freq: Once | INTRAMUSCULAR | Status: AC
  Administered 2020-10-08: 50 mg via INTRAVENOUS
  Filled 2020-10-08: qty 1

## 2020-10-08 MED ORDER — SODIUM CHLORIDE 0.9% FLUSH
10.0000 mL | Freq: Once | INTRAVENOUS | Status: AC
  Administered 2020-10-08: 10 mL via INTRAVENOUS
  Filled 2020-10-08: qty 10

## 2020-10-08 MED ORDER — PALONOSETRON HCL INJECTION 0.25 MG/5ML
0.2500 mg | Freq: Once | INTRAVENOUS | Status: AC
  Administered 2020-10-08: 0.25 mg via INTRAVENOUS
  Filled 2020-10-08: qty 5

## 2020-10-08 MED ORDER — FAMOTIDINE IN NACL 20-0.9 MG/50ML-% IV SOLN
20.0000 mg | Freq: Once | INTRAVENOUS | Status: AC
  Administered 2020-10-08: 20 mg via INTRAVENOUS
  Filled 2020-10-08: qty 50

## 2020-10-08 MED ORDER — SODIUM CHLORIDE 0.9 % IV SOLN
45.0000 mg/m2 | Freq: Once | INTRAVENOUS | Status: AC
  Administered 2020-10-08: 90 mg via INTRAVENOUS
  Filled 2020-10-08: qty 15

## 2020-10-08 MED ORDER — SODIUM CHLORIDE 0.9 % IV SOLN
20.0000 mg | Freq: Once | INTRAVENOUS | Status: AC
  Administered 2020-10-08: 20 mg via INTRAVENOUS
  Filled 2020-10-08: qty 20

## 2020-10-08 MED ORDER — HEPARIN SOD (PORK) LOCK FLUSH 100 UNIT/ML IV SOLN
500.0000 [IU] | Freq: Once | INTRAVENOUS | Status: AC | PRN
  Administered 2020-10-08: 500 [IU]
  Filled 2020-10-08: qty 5

## 2020-10-08 MED ORDER — SODIUM CHLORIDE 0.9 % IV SOLN
150.0000 mg | Freq: Once | INTRAVENOUS | Status: AC
  Administered 2020-10-08: 150 mg via INTRAVENOUS
  Filled 2020-10-08: qty 15

## 2020-10-08 NOTE — Progress Notes (Signed)
Stable at discharge 

## 2020-10-08 NOTE — Progress Notes (Signed)
[place] CONSULT NOTE  Patient Care Team: Baxter Hire, MD as PCP - General (Internal Medicine) [name], RN as Oncology Nurse Navigator [name], MD as Consulting Physician (Hematology and Oncology)  CHIEF COMPLAINTS/PURPOSE OF CONSULTATION: lung nodule/mass  #  Oncology History Overview Note  # NOV 2021- LEFT LOWER LOBE NON-SMALL CELL CA [favor adeno ca]; T2N3 [right hilar; subcarinal LN;[name]; NOV 2021-MRI Brain-NEG  # 11/30- carbo-Taxol-RT [RT until 10/23/20]   # A.fib [Eliquis; Dr.Kowalski]  # # SURVIVORSHIP:   # GENETICS:   #NGS-ordered  DIAGNOSIS: Lung cancer  STAGE:   III      ;  GOALS:  cure  CURRENT/MOST RECENT THERAPY : carbo-Taxol-RT ]   Cancer of lower lobe of left lung ([place])  08/14/2020 Initial Diagnosis   Cancer of lower lobe of left lung ([place])   09/02/2020 -  Chemotherapy   The patient had dexamethasone (DECADRON) 4 MG tablet, 8 mg, Oral, Daily, 1 of 1 cycle, Start date: --, End date: -- palonosetron (ALOXI) injection 0.25 mg, 0.25 mg, Intravenous,  Once, 6 of 8 cycles Administration: 0.25 mg (09/02/2020), 0.25 mg (10/01/2020), 0.25 mg (10/08/2020), 0.25 mg (09/10/2020), 0.25 mg (09/17/2020), 0.25 mg (09/24/2020) CARBOplatin (PARAPLATIN) 150 mg in sodium chloride 0.9 % 250 mL chemo infusion, 150 mg (100 % of original dose 149.4 mg), Intravenous,  Once, 6 of 8 cycles Dose modification:   (original dose 149.4 mg, Cycle 1),   (original dose 155.4 mg, Cycle 5) Administration: 150 mg (09/02/2020), 150 mg (10/01/2020), 150 mg (10/08/2020), 150 mg (09/10/2020), 150 mg (09/17/2020), 150 mg (09/24/2020) PACLitaxel (TAXOL) 90 mg in sodium chloride 0.9 % 250 mL chemo infusion (</= 80mg /m2), 45 mg/m2 = 90 mg, Intravenous,  Once, 6 of 8 cycles Administration: 90 mg (09/02/2020), 90 mg (10/01/2020), 90 mg (10/08/2020), 90 mg (09/10/2020), 90 mg (09/17/2020), 90 mg (09/24/2020)  for chemotherapy treatment.       HISTORY OF  PRESENTING ILLNESS:  Jason Warren 87 y.o.  male patient with stage III lung non-small cell lung-currently on concurrent chemoradiation for follow-up.  Patient follows up with Dr.Brahmanday who is off today, I am covering him to see this patient. Rash on the back, stable.  He uses Silvadene cream and itchiness is partially relieved. Some dysphagia, on sucralfate 1 g 3 times daily.  He reports symptoms are manageable. No nausea vomiting diarrhea.  Denies any shortness of breath . Review of Systems  Constitutional: Negative for chills, diaphoresis, fever, malaise/fatigue and weight loss.  HENT: Negative for nosebleeds and sore throat.   Eyes: Negative for double vision and redness.  Respiratory: Positive for cough. Negative for hemoptysis, sputum production, shortness of breath and wheezing.   Cardiovascular: Negative for chest pain, palpitations, orthopnea and leg swelling.  Gastrointestinal: Negative for abdominal pain, blood in stool, constipation, diarrhea, heartburn, melena, nausea and vomiting.  Genitourinary: Negative for dysuria, frequency and urgency.  Musculoskeletal: Negative for back pain, joint pain and myalgias.  Skin: Positive for rash. Negative for itching.  Neurological: Negative for dizziness, tingling, tremors, focal weakness, weakness and headaches.  Endo/Heme/Allergies: Does not bruise/bleed easily.  Psychiatric/Behavioral: Negative for depression and hallucinations. The patient is not nervous/anxious and does not have insomnia.      MEDICAL HISTORY:  Past Medical History:  Diagnosis Date  . Arthritis   . Dysrhythmia    A-fib  . Hip pain    left  . HOH (hard of hearing)   . LBBB (left bundle branch block) 08/05/2020  .  Skin cancer, basal cell     face top of head    SURGICAL HISTORY: Past Surgical History:  Procedure Laterality Date  . CATARACT EXTRACTION W/PHACO Right 07/13/2017   Procedure: CATARACT EXTRACTION PHACO AND INTRAOCULAR LENS PLACEMENT ([place]);   Surgeon: Leandrew Koyanagi, MD;  Location: [facility];  Service: Ophthalmology;  Laterality: Right;  IVA TOPICAL RIGHT  . CATARACT EXTRACTION W/PHACO Left 01/04/2018   Procedure: CATARACT EXTRACTION PHACO AND INTRAOCULAR LENS PLACEMENT ([place]) LEFT;  Surgeon: Leandrew Koyanagi, MD;  Location: [facility];  Service: Ophthalmology;  Laterality: Left;  . COLONOSCOPY    . IR IMAGING GUIDED PORT INSERTION  08/27/2020  . JOINT REPLACEMENT     Left total hip Dr. Su Hoff 08-04-18  . ROTATOR CUFF REPAIR Right   . SKIN CANCER EXCISION     face  . TOTAL HIP ARTHROPLASTY Left 08/04/2018   Procedure: TOTAL HIP ARTHROPLASTY ANTERIOR APPROACH;  Surgeon: Frederik Pear, MD;  Location: [facility];  Service: Orthopedics;  Laterality: Left;  Marland Kitchen VIDEO BRONCHOSCOPY WITH ENDOBRONCHIAL NAVIGATION N/A 08/07/2020   Procedure: VIDEO BRONCHOSCOPY WITH ENDOBRONCHIAL NAVIGATION;  Surgeon: Ottie Glazier, MD;  Location: [facility];  Service: Thoracic;  Laterality: N/A;  . VIDEO BRONCHOSCOPY WITH ENDOBRONCHIAL ULTRASOUND N/A 08/07/2020   Procedure: VIDEO BRONCHOSCOPY WITH ENDOBRONCHIAL ULTRASOUND;  Surgeon: Ottie Glazier, MD;  Location: [facility];  Service: Thoracic;  Laterality: N/A;    SOCIAL HISTORY: Social History   Socioeconomic History  . Marital status: Married    Spouse name: Not on file  . Number of children: Not on file  . Years of education: Not on file  . Highest education level: Not on file  Occupational History  . Not on file  Tobacco Use  . Smoking status: Former Smoker    Packs/day: 1.00    Years: 4.00    Pack years: 4.00    Types: Cigarettes  . Smokeless tobacco: Never Used  . Tobacco comment: quit early 70's  Vaping Use  . Vaping Use: Never used  Substance and Sexual Activity  . Alcohol use: No  . Drug use: No  . Sexual activity: Not Currently  Other Topics Concern  . Not on file  Social History Narrative   > quit 35 years; smoked for 15 years. Rare alcohol. 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    Social Determinants of Health   Financial Resource Strain: Not on file  Food Insecurity: Not on file  Transportation Needs: Not on file  Physical Activity: Not on file  Stress: Not on file  Social Connections: Not on file  Intimate Partner Violence: Not on file    FAMILY HISTORY: Family History  Problem Relation Age of Onset  . Throat cancer Brother         & lung cancer    ALLERGIES:  has No Known Allergies.  MEDICATIONS:  Current Outpatient Medications  Medication Sig Dispense Refill  . acetaminophen (TYLENOL) 500 MG tablet Take 500 mg by mouth every 8 (eight) hours as needed for moderate pain.     Marland Kitchen apixaban (ELIQUIS) 5 MG TABS tablet Take 5 mg by mouth 2 (two) times daily.    . hydroxypropyl methylcellulose / hypromellose (ISOPTO TEARS / GONIOVISC) 2.5 % ophthalmic solution Place 1 drop into the left eye daily as needed for dry eyes.    Marland Kitchen lidocaine-prilocaine (EMLA) cream Apply 1 application topically as needed. Apply small amount to port  site at least 1 hour prior to it being accessed, cover with plastic wrap 30 g 1  . Multiple Vitamin (MULTIVITAMIN) tablet Take 1 tablet by mouth daily.    Vladimir Faster Glycol-Propyl Glycol (SYSTANE FREE OP) Apply to eye.    . pravastatin (PRAVACHOL) 80 MG tablet Take 80 mg by mouth at bedtime.     . saw palmetto 160 MG capsule Take 160 mg by mouth 2 (two) times daily.    . silver sulfADIAZINE (SILVADENE) 1 % cream Apply 1 application topically 2 (two) times daily. 85 g 2  . sucralfate (CARAFATE) 1 g tablet Take 1 tablet (1 g total) by mouth 4 (four) times daily -  with meals and at bedtime. 90 tablet 1  . ondansetron (ZOFRAN) 8 MG tablet One pill every 8 hours as needed for nausea/vomitting. (Patient not taking: No sig reported) 40 tablet 1  . prochlorperazine (COMPAZINE) 10 MG tablet Take 1 tablet (10 mg total) by mouth every 6 (six) hours as needed  for nausea or vomiting. (Patient not taking: No sig reported) 40 tablet 1   No current facility-administered medications for this visit.   PHYSICAL EXAMINATION: ECOG PERFORMANCE STATUS: 1 - Symptomatic but completely ambulatory  Vitals:   10/08/20 0939  BP: 125/75  Pulse: 75  Resp: 18  Temp: (!) 97.3 F (36.3 C)   Filed Weights   10/08/20 0939  Weight: 163 lb 9.6 oz (74.2 kg)    Physical Exam Constitutional:      Comments: Walking independently but with a limp because of left hip arthritis.  He is alone.  HENT:     Head: Normocephalic and atraumatic.     Mouth/Throat:     Pharynx: No oropharyngeal exudate.  Eyes:     Pupils: Pupils are equal, round, and reactive to light.  Cardiovascular:     Rate and Rhythm: Normal rate and regular rhythm.  Pulmonary:     Effort: Pulmonary effort is normal. No respiratory distress.     Breath sounds: Normal breath sounds. No wheezing.  Abdominal:     General: Bowel sounds are normal. There is no distension.     Palpations: Abdomen is soft. There is no mass.     Tenderness: There is no abdominal tenderness. There is no guarding or rebound.  Musculoskeletal:        General: No tenderness. Normal range of motion.     Cervical back: Normal range of motion and neck supple.  Skin:    General: Skin is warm.     Comments: Rash noted on the back.   Neurological:     Mental Status: He is alert and oriented to person, place, and time.  Psychiatric:        Mood and Affect: Affect normal.          LABORATORY DATA:  I have reviewed the data as listed Lab Results  Component Value Date   WBC 3.1 (L) 10/08/2020   HGB 12.6 (L) 10/08/2020   HCT 36.8 (L) 10/08/2020   MCV 94.8 10/08/2020   PLT 171 10/08/2020   Recent Labs    09/24/20 0839 10/01/20 0835 10/08/20 0840  NA 135 137 136  K 4.1 4.0 4.1  CL 102 104 104  CO2 23 25 21*  GLUCOSE 123* 113* 145*  BUN 19 16 17   CREATININE 0.95 0.81 0.91  CALCIUM 8.6* 8.6* 8.6*  GFRNONAA  >60 >60 >60  PROT 7.1 6.7 6.7  ALBUMIN 4.1 3.8 3.8  AST 26  25 26  ALT 19 20 20   ALKPHOS 88 88 97  BILITOT 1.0 0.7 0.9    RADIOGRAPHIC STUDIES: I have personally reviewed the radiological images as listed and agreed with the findings in the report. CT CHEST W CONTRAST  Result Date: 07/18/2020 CLINICAL DATA:  Left hilar airspace opacity on chest radiography EXAM: CT CHEST WITH CONTRAST TECHNIQUE: Multidetector CT imaging of the chest was performed during intravenous contrast administration. CONTRAST:  24mL OMNIPAQUE IOHEXOL 300 MG/ML  SOLN COMPARISON:  CT abdomen 12/24/2019 FINDINGS: Cardiovascular: Heart size normal. No pericardial effusion. Dilated central pulmonary arteries suggesting pulmonary arterial hypertension. Coronary calcifications. Atheromatous aortic arch and descending thoracic aorta. Mediastinum/Nodes: 1.6 cm right hilar lymph node. 1 cm left hilar lymph node. 1.6 cm subcarinal adenopathy. Subcentimeter anterior mediastinal, prevascular, AP window, and right paratracheal lymph nodes. Lungs/Pleura: No pleural effusion. No pneumothorax. 3.8 cm subpleural mass in the superior segment left lower lobe. 3.3 cm masslike opacity in the superior segment right lower lobe with some adjacent bronchiectasis and pleural retraction. Lungs otherwise clear. Upper Abdomen: 1.2 cm probable cyst in left hepatic lobe, stable. Stable 2.6 cm probable cyst, upper pole right kidney. No adrenal mass or other acute finding. Musculoskeletal: Stable T12 and L1 compression deformities. No acute fracture or worrisome bone lesion. IMPRESSION: 1. 3.8 cm subpleural mass in the superior segment left lower lobe, and 3.3 cm masslike opacity in the superior segment right lower lobe, with bilateral hilar and mediastinal adenopathy. Findings may represent primary bronchogenic carcinoma versus metastatic disease. PET-CT may be useful for further characterization. 2. Coronary and Aortic Atherosclerosis (ICD10-I70.0).  Electronically Signed   By: Lucrezia Europe M.D.   On: 07/18/2020 10:27   MR Brain W Wo Contrast  Result Date: 08/19/2020 CLINICAL DATA:  Lung cancer, staging EXAM: MRI HEAD WITHOUT AND WITH CONTRAST TECHNIQUE: Multiplanar, multiecho pulse sequences of the brain and surrounding structures were obtained without and with intravenous contrast. CONTRAST:  7.5 mL Gadavist COMPARISON:  None. FINDINGS: Brain: There is no acute infarction or intracranial hemorrhage. There is no intracranial mass, mass effect, or edema. There is no hydrocephalus or extra-axial fluid collection. Prominence of the ventricles and sulci reflects mild generalized parenchymal volume loss. Patchy areas of T2 hyperintensity in the supratentorial white matter are nonspecific but may reflect mild to moderate microvascular ischemic changes. No abnormal enhancement. Vascular: Major vessel flow voids at the skull base are preserved. Skull and upper cervical spine: Normal marrow signal is preserved. Sinuses/Orbits: Paranasal sinuses are aerated. Orbits are unremarkable. Other: Sella is unremarkable.  Mastoid air cells are clear. IMPRESSION: No evidence of intracranial metastatic disease. Electronically Signed   By: Macy Mis M.D.   On: 08/19/2020 15:22   NM PET Image Initial (PI) Skull Base To Thigh  Result Date: 07/22/2020 CLINICAL DATA:  Initial treatment strategy for left lung mass. EXAM: NUCLEAR MEDICINE PET SKULL BASE TO THIGH TECHNIQUE: 9.03 mCi F-18 FDG was injected intravenously. Full-ring PET imaging was performed from the skull base to thigh after the radiotracer. CT data was obtained and used for attenuation correction and anatomic localization. Fasting blood glucose: 80 mg/dl COMPARISON:  Chest CT 07/17/2020.  Abdominopelvic CT 12/24/2019 FINDINGS: Mediastinal blood pool activity: SUV max 1.8 NECK: No hypermetabolic cervical lymph nodes are identified.There are no lesions of the pharyngeal mucosal space. Incidental CT findings: none  CHEST: There are small hypermetabolic mediastinal and hilar lymph nodes. 1.4 cm subcarinal node on image 107/3 has an SUV max 6.3. A small left hilar node has  an SUV max of 5.1. Right hilar node has an SUV max of 3.3. The 3.7 x 3.6 cm mass in the superior segment of the left lower lobe is markedly hypermetabolic with an SUV max of 10.8. The contralateral density in the superior segment of the right lower lobe which measures approximately 4.6 x 3.1 cm on image 112/3 demonstrates no significant hypermetabolic activity (SUV max 1.8). This is associated with volume loss and bronchiectasis and is favored to reflect postinflammatory scarring. No other suspicious pulmonary metabolic activity or nodularity. Incidental CT findings: Atherosclerosis of the aorta, great vessels and coronary arteries. ABDOMEN/PELVIS: There is no hypermetabolic activity within the liver, adrenal glands, spleen or pancreas. There is no hypermetabolic nodal activity. Incidental CT findings: Stable cysts in the left hepatic lobe and upper pole of the right kidney. Aortic and branch vessel atherosclerosis. Right-sided bladder diverticulum and mild enlargement of the prostate gland. SKELETON: There is no hypermetabolic activity to suggest osseous metastatic disease. Incidental CT findings: Previous left total hip arthroplasty. Chronic Schmorl's nodes and endplate compression deformities at T12 and L1. IMPRESSION: 1. The mass in the superior segment of the left lower lobe is hypermetabolic consistent with primary bronchogenic carcinoma. There are small mildly hypermetabolic mediastinal and hilar lymph nodes bilaterally. No distant metastases identified. By this examination, findings reflect at least IIIA disease (T2aN2MX) and possibly IIIB disease (T2aN3MX). 2. The finding in the superior segment of the right lower lobe is not hypermetabolic and is favored to reflect chronic postinflammatory scarring with bronchiectasis. 3. No significant findings in  the abdomen or pelvis. Electronically Signed   By: Richardean Sale M.D.   On: 07/22/2020 16:07   DG Chest Port 1 View  Result Date: 08/07/2020 CLINICAL DATA:  Status post bronchoscopy. EXAM: PORTABLE CHEST 1 VIEW COMPARISON:  August 04, 2020. FINDINGS: Left lower lobe mass is noted as described on prior CT scan. No pneumothorax or pleural effusion is noted. Right infrahilar opacity is noted concerning for scarring or bronchiectasis. Bony thorax is unremarkable. IMPRESSION: Left lower lobe mass is noted as described on prior CT scan. Right infrahilar opacity is noted concerning for scarring or bronchiectasis. No pneumothorax is noted. Aortic Atherosclerosis (ICD10-I70.0). Electronically Signed   By: Marijo Conception M.D.   On: 08/07/2020 10:22   DG C-Arm 1-60 Min-No Report  Result Date: 08/07/2020 Fluoroscopy was utilized by the requesting physician.  No radiographic interpretation.   IR IMAGING GUIDED PORT INSERTION  Result Date: 08/27/2020 INDICATION: 87 year old male with left lower lobe non-small cell lung cancer. He requires durable venous access for chemotherapy. EXAM: IMPLANTED PORT A CATH PLACEMENT WITH ULTRASOUND AND FLUOROSCOPIC GUIDANCE MEDICATIONS: 2 g Ancef; The antibiotic was administered within an appropriate time interval prior to skin puncture. ANESTHESIA/SEDATION: Versed 1 mg IV; Fentanyl 25 mcg IV; Moderate Sedation Time:  15 minutes The patient was continuously monitored during the procedure by the interventional radiology nurse under my direct supervision. FLUOROSCOPY TIME:  0 minutes, 48 seconds (3.71 mGy) COMPLICATIONS: None immediate. PROCEDURE: The right neck and chest was prepped with chlorhexidine, and draped in the usual sterile fashion using maximum barrier technique (cap and mask, sterile gown, sterile gloves, large sterile sheet, hand hygiene and cutaneous antiseptic). Local anesthesia was attained by infiltration with 1% lidocaine with epinephrine. Ultrasound demonstrated  patency of the right internal jugular vein, and this was documented with an image. Under real-time ultrasound guidance, this vein was accessed with a 21 gauge micropuncture needle and image documentation was performed. A small dermatotomy was  made at the access site with an 11 scalpel. A 0.018" wire was advanced into the SVC and the access needle exchanged for a 40F micropuncture vascular sheath. The 0.018" wire was then removed and a 0.035" wire advanced into the IVC. An appropriate location for the subcutaneous reservoir was selected below the clavicle and an incision was made through the skin and underlying soft tissues. The subcutaneous tissues were then dissected using a combination of blunt and sharp surgical technique and a pocket was formed. A single lumen power injectable portacatheter was then tunneled through the subcutaneous tissues from the pocket to the dermatotomy and the port reservoir placed within the subcutaneous pocket. The venous access site was then serially dilated and a peel away vascular sheath placed over the wire. The wire was removed and the port catheter advanced into position under fluoroscopic guidance. The catheter tip is positioned in the superior cavoatrial junction. This was documented with a spot image. The portacatheter was then tested and found to flush and aspirate well. The port was flushed with saline followed by 100 units/mL heparinized saline. The pocket was then closed in two layers using first subdermal inverted interrupted absorbable sutures followed by a running subcuticular suture. The epidermis was then sealed with Dermabond. The dermatotomy at the venous access site was also closed with Dermabond. IMPRESSION: Successful placement of a right IJ approach Power Port with ultrasound and fluoroscopic guidance. The catheter is ready for use. Electronically Signed   By: Jacqulynn Cadet M.D.   On: 08/27/2020 10:47   CT Super D Chest Wo Contrast  Result Date:  08/04/2020 CLINICAL DATA:  Bronchoscopy scheduled 08/07/2020. Mediastinal pain, mid chest pain. EXAM: CT CHEST WITHOUT CONTRAST TECHNIQUE: Multidetector CT imaging of the chest was performed using thin slice collimation for electromagnetic bronchoscopy planning purposes, without intravenous contrast. COMPARISON:  PET 07/22/2020 and CT chest 07/17/2020. FINDINGS: Cardiovascular: Atherosclerotic calcification of the aorta, aortic valve and coronary arteries. Right and left pulmonary arteries and heart are enlarged. No pericardial effusion. Mediastinum/Nodes: Low left paratracheal lymph node measures 1.6 cm, stable. There are additional subcentimeter short axis mediastinal lymph nodes. Subcarinal lymph node measures 1.5 cm, as before. Hilar regions are difficult to evaluate without IV contrast. No axillary adenopathy. Esophagus is grossly unremarkable. Lungs/Pleura: A spiculated mass in the superior segment left lower lobe measures 3.3 x 3.7 cm. There is a short border of contact, and extensions to, the adjacent visceral pleural, with pleural thickening. An area of irregular soft tissue, architectural distortion and bronchiectasis in the superior segment right lower lobe measures 3.5 x 3.7 cm. Inferiorly, there is some associated ground-glass along the peribronchovascular interstitium. No pleural fluid. Airway is unremarkable. Upper Abdomen: 1.5 cm low-attenuation lesion in the dome of the liver is unchanged but too small to characterize. Visualized portions of the liver, gallbladder and adrenal glands are otherwise unremarkable. Low-attenuation lesions in the kidneys measure up to 2.7 cm on the right, the largest of which is likely a cyst. Others are too small to characterize. Visualized portions of the kidneys, spleen, pancreas, stomach and bowel are otherwise grossly unremarkable. Musculoskeletal: Degenerative changes in the spine. No worrisome lytic or sclerotic lesions. T12 and L1 compression deformities are  unchanged. Old sternal fracture. IMPRESSION: 1. Spiculated mass in the superior segment left lower lobe with associated mediastinal adenopathy, characterized as at least stage IIIA primary bronchogenic carcinoma on 07/22/2020. 2. Confluent area of parenchymal retraction and bronchiectasis in the right lower lobe, not shown to be hypermetabolic on recent PET.  Note is made of associated ground-glass along the peribronchovascular interstitium, inferior to this lesion. Adenocarcinoma cannot be excluded. Continued attention on follow-up exams is suggested. 3. Aortic atherosclerosis (ICD10-I70.0). Coronary artery calcification. 4. Enlarged pulmonary arteries, indicative of pulmonary arterial hypertension. Electronically Signed   By: Lorin Picket M.D.   On: 08/04/2020 13:47    ASSESSMENT & PLAN:  1. Cancer of lower lobe of left lung ([place])   2. Encounter for antineoplastic chemotherapy   3. Radiation esophagitis   4. Radiation dermatitis    #Left non-small cell lung cancer-favor adenocarcinoma, stage III, T2M3 Currently on concurrent chemo and radiation. Labs reviewed and discussed with patient.  Counts acceptable to proceed with carboplatin and Taxol today. Continue follow-up with radiation oncology for radiation.  #Radiation esophagitis, continue Carafate 1 g 3 times daily.  Stable symptoms. #Radiation dermatitis, continue Silvadene cream.  Stable History of A. fib on Eliquis, stable. #Mild anemia, hemoglobin 12.6, stable.  Chemo/radiation induced.  Patient will follow up in 1 week lab MD-Dr. Rogue Bussing chemotherapy  All questions were answered. The patient knows to call the clinic with any problems, questions or concerns.   Earlie Server, MD 10/08/2020 7:55 PM

## 2020-10-08 NOTE — Progress Notes (Signed)
Patient denies new problems/concerns today.   °

## 2020-10-09 ENCOUNTER — Ambulatory Visit
Admission: RE | Admit: 2020-10-09 | Discharge: 2020-10-09 | Disposition: A | Discharge status: Home / Self Care | Payer: Medicare Other | Source: Ambulatory Visit | Attending: Radiation Oncology | Admitting: Radiation Oncology

## 2020-10-09 DIAGNOSIS — Z510 Encounter for antineoplastic radiation therapy: Secondary | ICD-10-CM | POA: Diagnosis not present

## 2020-10-10 ENCOUNTER — Ambulatory Visit: Payer: Medicare Other

## 2020-10-13 ENCOUNTER — Ambulatory Visit: Payer: Medicare Other

## 2020-10-13 ENCOUNTER — Telehealth: Payer: Self-pay | Admitting: *Deleted

## 2020-10-13 NOTE — Telephone Encounter (Signed)
Message received that pt had one episode of stomach pain over the weekend which has resolved today. Pt is doing better today and has follow up scheduled on 1/12 with Dr. Jacinto Reap. Instructed to call back if pt experiences another episode of stomach pain or any other symptoms. Pt's daughter verbalized understanding.

## 2020-10-13 NOTE — Progress Notes (Signed)
Order(s) created erroneously. Erroneous order ID: [id number]  Order moved by: Genia Harold D  Order move date/time: 10/13/2020 1:00 PM  Source Patient: [id number]  Source Contact: 10/08/2020  Destination Patient: [id number]  Destination Contact: 05/22/2020

## 2020-10-14 ENCOUNTER — Ambulatory Visit
Admission: RE | Admit: 2020-10-14 | Discharge: 2020-10-14 | Disposition: A | Discharge status: Home / Self Care | Payer: Medicare Other | Source: Ambulatory Visit | Attending: Radiation Oncology | Admitting: Radiation Oncology

## 2020-10-14 ENCOUNTER — Ambulatory Visit: Payer: Medicare Other

## 2020-10-14 DIAGNOSIS — Z510 Encounter for antineoplastic radiation therapy: Secondary | ICD-10-CM | POA: Diagnosis not present

## 2020-10-15 ENCOUNTER — Inpatient Hospital Stay: Payer: Medicare Other

## 2020-10-15 ENCOUNTER — Inpatient Hospital Stay (HOSPITAL_BASED_OUTPATIENT_CLINIC_OR_DEPARTMENT_OTHER): Payer: Medicare Other | Admitting: Internal Medicine

## 2020-10-15 ENCOUNTER — Ambulatory Visit
Admission: RE | Admit: 2020-10-15 | Discharge: 2020-10-15 | Disposition: A | Discharge status: Home / Self Care | Payer: Medicare Other | Source: Ambulatory Visit | Attending: Radiation Oncology | Admitting: Radiation Oncology

## 2020-10-15 DIAGNOSIS — C3432 Malignant neoplasm of lower lobe, left bronchus or lung: Secondary | ICD-10-CM | POA: Diagnosis not present

## 2020-10-15 DIAGNOSIS — Z510 Encounter for antineoplastic radiation therapy: Secondary | ICD-10-CM | POA: Diagnosis not present

## 2020-10-15 DIAGNOSIS — Z5111 Encounter for antineoplastic chemotherapy: Secondary | ICD-10-CM | POA: Diagnosis not present

## 2020-10-15 LAB — COMPREHENSIVE METABOLIC PANEL
ALT: 29 U/L (ref 0–44)
AST: 31 U/L (ref 15–41)
Albumin: 4 g/dL (ref 3.5–5.0)
Alkaline Phosphatase: 109 U/L (ref 38–126)
Anion gap: 8 (ref 5–15)
BUN: 18 mg/dL (ref 8–23)
CO2: 25 mmol/L (ref 22–32)
Calcium: 8.6 mg/dL — ABNORMAL LOW (ref 8.9–10.3)
Chloride: 103 mmol/L (ref 98–111)
Creatinine, Ser: 1.01 mg/dL (ref 0.61–1.24)
GFR, Estimated: >60 mL/min (ref >60)
Glucose, Bld: 148 mg/dL — ABNORMAL HIGH (ref 70–99)
Potassium: 4.2 mmol/L (ref 3.5–5.1)
Sodium: 136 mmol/L (ref 135–145)
Total Bilirubin: 0.9 mg/dL (ref 0.3–1.2)
Total Protein: 7 g/dL (ref 6.5–8.1)

## 2020-10-15 LAB — CBC WITH DIFFERENTIAL/PLATELET
Abs Immature Granulocytes: 0.02 10*3/uL (ref 0.00–0.07)
Basophils Absolute: 0 10*3/uL (ref 0.0–0.1)
Basophils Relative: 0 %
Eosinophils Absolute: 0 10*3/uL (ref 0.0–0.5)
Eosinophils Relative: 0 %
HCT: 36.1 % — ABNORMAL LOW (ref 39.0–52.0)
Hemoglobin: 12.4 g/dL — ABNORMAL LOW (ref 13.0–17.0)
Immature Granulocytes: 1 %
Lymphocytes Relative: 20 %
Lymphs Abs: 0.6 10*3/uL — ABNORMAL LOW (ref 0.7–4.0)
MCH: 32.5 pg (ref 26.0–34.0)
MCHC: 34.3 g/dL (ref 30.0–36.0)
MCV: 94.5 fL (ref 80.0–100.0)
Monocytes Absolute: 0.4 10*3/uL (ref 0.1–1.0)
Monocytes Relative: 12 %
Neutro Abs: 2.1 10*3/uL (ref 1.7–7.7)
Neutrophils Relative %: 67 %
Platelets: 186 10*3/uL (ref 150–400)
RBC: 3.82 MIL/uL — ABNORMAL LOW (ref 4.22–5.81)
RDW: 14.7 % (ref 11.5–15.5)
WBC: 3.1 10*3/uL — ABNORMAL LOW (ref 4.0–10.5)
nRBC: 0 % (ref 0.0–0.2)

## 2020-10-15 MED ORDER — PALONOSETRON HCL INJECTION 0.25 MG/5ML
0.2500 mg | Freq: Once | INTRAVENOUS | Status: AC
Start: 2020-10-15 — End: 2020-10-15
  Administered 2020-10-15: 0.25 mg via INTRAVENOUS
  Filled 2020-10-15: qty 5

## 2020-10-15 MED ORDER — SODIUM CHLORIDE 0.9 % IV SOLN
20.0000 mg | Freq: Once | INTRAVENOUS | Status: AC
  Administered 2020-10-15: 20 mg via INTRAVENOUS
  Filled 2020-10-15: qty 20

## 2020-10-15 MED ORDER — DIPHENHYDRAMINE HCL 50 MG/ML IJ SOLN
50.0000 mg | Freq: Once | INTRAMUSCULAR | Status: AC
  Administered 2020-10-15: 50 mg via INTRAVENOUS
  Filled 2020-10-15: qty 1

## 2020-10-15 MED ORDER — HEPARIN SOD (PORK) LOCK FLUSH 100 UNIT/ML IV SOLN
500.0000 [IU] | Freq: Once | INTRAVENOUS | Status: AC | PRN
  Administered 2020-10-15: 500 [IU]
  Filled 2020-10-15: qty 5

## 2020-10-15 MED ORDER — SODIUM CHLORIDE 0.9 % IV SOLN
45.0000 mg/m2 | Freq: Once | INTRAVENOUS | Status: AC
  Administered 2020-10-15: 90 mg via INTRAVENOUS
  Filled 2020-10-15: qty 15

## 2020-10-15 MED ORDER — HEPARIN SOD (PORK) LOCK FLUSH 100 UNIT/ML IV SOLN
INTRAVENOUS | Status: AC
  Filled 2020-10-15: qty 5

## 2020-10-15 MED ORDER — SODIUM CHLORIDE 0.9 % IV SOLN
Freq: Once | INTRAVENOUS | Status: AC
  Filled 2020-10-15: qty 250

## 2020-10-15 MED ORDER — CARBOPLATIN CHEMO INJECTION 450 MG/45ML
154.4000 mg | Freq: Once | INTRAVENOUS | Status: AC
  Administered 2020-10-15: 150 mg via INTRAVENOUS
  Filled 2020-10-15: qty 15

## 2020-10-15 MED ORDER — FAMOTIDINE IN NACL 20-0.9 MG/50ML-% IV SOLN
20.0000 mg | Freq: Once | INTRAVENOUS | Status: AC
  Administered 2020-10-15: 20 mg via INTRAVENOUS
  Filled 2020-10-15: qty 50

## 2020-10-15 NOTE — Progress Notes (Signed)
Stable at discharge 

## 2020-10-15 NOTE — Progress Notes (Signed)
[facility] [place] [facility] NOTE  Patient Care Team: Baxter Hire, MD as PCP - General (Internal Medicine) [name], RN as Oncology Nurse Navigator [name], MD as Consulting Physician (Hematology and Oncology)  CHIEF COMPLAINTS/PURPOSE OF CONSULTATION: lung nodule/mass  #  Oncology History Overview Note  # NOV 2021- LEFT LOWER LOBE NON-SMALL CELL CA [favor adeno ca]; T2N3 [right hilar; subcarinal LN;[name]; NOV 2021-MRI [facility]  # 11/30- carbo-Taxol-RT [RT until 10/23/20]   # A.fib [Eliquis; Dr.Kowalski]  # # SURVIVORSHIP:   # GENETICS:   #NGS-ordered  DIAGNOSIS: Lung cancer  STAGE:   III      ;  GOALS:  cure  CURRENT/MOST RECENT THERAPY : carbo-Taxol-RT ]   Cancer of lower lobe of left lung ([place])  08/14/2020 Initial Diagnosis   Cancer of lower lobe of left lung ([place])   09/02/2020 -  Chemotherapy    Patient is on Treatment Plan: LUNG CARBOPLATIN / PACLITAXEL + XRT Q7D      10/16/2020 Cancer Staging   Staging form: Lung, AJCC 8th Edition - Clinical: Stage IIIB (cT2a, cN3, cM0) - Signed by [name], MD on 10/16/2020      HISTORY OF PRESENTING ILLNESS:  Jason Warren 87 y.o.  male patient with stage III lung non-small cell lung-currently on concurrent chemoradiation for follow-up.  Patient difficulty swallowing has improved.  Rash in the radiation portal is improved.  No worsening cough.  No worsening difficulties with breathing noted.  Mild to moderate fatigue.  Review of Systems  Constitutional: Negative for chills, diaphoresis, fever, malaise/fatigue and weight loss.  HENT: Negative for nosebleeds and sore throat.   Eyes: Negative for double vision.  Respiratory: Positive for cough. Negative for hemoptysis, sputum production, shortness of breath and wheezing.   Cardiovascular: Negative for chest pain, palpitations, orthopnea and leg swelling.  Gastrointestinal: Negative for abdominal pain, blood in stool,  constipation, diarrhea, heartburn, melena, nausea and vomiting.  Genitourinary: Negative for dysuria, frequency and urgency.  Musculoskeletal: Negative for back pain and joint pain.  Skin: Positive for rash. Negative for itching.  Neurological: Negative for tingling, focal weakness, weakness and headaches.  Endo/Heme/Allergies: Does not bruise/bleed easily.  Psychiatric/Behavioral: Negative for depression. The patient is not nervous/anxious and does not have insomnia.      MEDICAL HISTORY:  Past Medical History:  Diagnosis Date   Arthritis    Dysrhythmia    A-fib   Hip pain    left   HOH (hard of hearing)    LBBB (left bundle branch block) 08/05/2020   Skin cancer, basal cell     face top of head    SURGICAL HISTORY: Past Surgical History:  Procedure Laterality Date   CATARACT EXTRACTION W/PHACO Right 07/13/2017   Procedure: CATARACT EXTRACTION PHACO AND INTRAOCULAR LENS PLACEMENT (I[facility]);  Surgeon: Leandrew Koyanagi, MD;  Location: [facility];  Service: Ophthalmology;  Laterality: Right;  IVA TOPICAL RIGHT   CATARACT EXTRACTION W/PHACO Left 01/04/2018   Procedure: CATARACT EXTRACTION PHACO AND INTRAOCULAR LENS PLACEMENT ([place]) LEFT;  Surgeon: Leandrew Koyanagi, MD;  Location: [facility];  Service: Ophthalmology;  Laterality: Left;   COLONOSCOPY     IR IMAGING GUIDED PORT INSERTION  08/27/2020   JOINT REPLACEMENT     Left total hip Dr. Su Hoff 08-04-18   ROTATOR CUFF REPAIR Right    SKIN CANCER EXCISION     face   TOTAL HIP ARTHROPLASTY Left 08/04/2018   Procedure: TOTAL HIP ARTHROPLASTY ANTERIOR APPROACH;  Surgeon: Frederik Pear,  MD;  Location: [facility];  Service: Orthopedics;  Laterality: Left;   VIDEO BRONCHOSCOPY WITH ENDOBRONCHIAL NAVIGATION N/A 08/07/2020   Procedure: VIDEO BRONCHOSCOPY WITH ENDOBRONCHIAL NAVIGATION;  Surgeon: Ottie Glazier, MD;  Location: [facility];  Service: Thoracic;  Laterality: N/A;   VIDEO BRONCHOSCOPY WITH  ENDOBRONCHIAL ULTRASOUND N/A 08/07/2020   Procedure: VIDEO BRONCHOSCOPY WITH ENDOBRONCHIAL ULTRASOUND;  Surgeon: Ottie Glazier, MD;  Location: [facility];  Service: Thoracic;  Laterality: N/A;    SOCIAL HISTORY: Social History   Socioeconomic History   Marital status: Married    Spouse name: Not on file   Number of children: Not on file   Years of education: Not on file   Highest education level: Not on file  Occupational History   Not on file  Tobacco Use   Smoking status: Former Smoker    Packs/day: 1.00    Years: 4.00    Pack years: 4.00    Types: Cigarettes   Smokeless tobacco: Never Used   Tobacco comment: quit early 70's  Vaping Use   Vaping Use: Never used  Substance and Sexual Activity   Alcohol use: No   Drug use: No   Sexual activity: Not Currently  Other Topics Concern   Not on file  Social History Narrative   > quit 35 years; smoked for 15 years. Rare alcohol. 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    Social Determinants of Health   Financial Resource Strain: Not on file  Food Insecurity: Not on file  Transportation Needs: Not on file  Physical Activity: Not on file  Stress: Not on file  Social Connections: Not on file  Intimate Partner Violence: Not on file    FAMILY HISTORY: Family History  Problem Relation Age of Onset   Throat cancer Brother         & lung cancer    ALLERGIES:  has No Known Allergies.  MEDICATIONS:  Current Outpatient Medications  Medication Sig Dispense Refill   acetaminophen (TYLENOL) 500 MG tablet Take 500 mg by mouth every 8 (eight) hours as needed for moderate pain.      apixaban (ELIQUIS) 5 MG TABS tablet Take 5 mg by mouth 2 (two) times daily.     hydroxypropyl methylcellulose / hypromellose (ISOPTO TEARS / GONIOVISC) 2.5 % ophthalmic solution Place 1 drop into the left eye daily as needed for dry eyes.     lidocaine-prilocaine (EMLA)  cream Apply 1 application topically as needed. Apply small amount to port site at least 1 hour prior to it being accessed, cover with plastic wrap 30 g 1   Multiple Vitamin (MULTIVITAMIN) tablet Take 1 tablet by mouth daily.     Polyethyl Glycol-Propyl Glycol (SYSTANE FREE OP) Apply to eye.     pravastatin (PRAVACHOL) 80 MG tablet Take 80 mg by mouth at bedtime.      saw palmetto 160 MG capsule Take 160 mg by mouth 2 (two) times daily.     silver sulfADIAZINE (SILVADENE) 1 % cream Apply 1 application topically 2 (two) times daily. 85 g 2   sucralfate (CARAFATE) 1 g tablet Take 1 tablet (1 g total) by mouth 4 (four) times daily -  with meals and at bedtime. 90 tablet 1   ondansetron (ZOFRAN) 8 MG tablet One pill every 8 hours as needed for nausea/vomitting. (Patient not taking: No sig reported) 40 tablet 1   prochlorperazine (COMPAZINE) 10 MG tablet Take 1 tablet (  10 mg total) by mouth every 6 (six) hours as needed for nausea or vomiting. (Patient not taking: No sig reported) 40 tablet 1   No current facility-administered medications for this visit.   PHYSICAL EXAMINATION: ECOG PERFORMANCE STATUS: 1 - Symptomatic but completely ambulatory  Vitals:   10/15/20 1002  BP: 129/73  Pulse: 76  Resp: 20  Temp: (!) 96.7 F (35.9 C)  SpO2: 100%   Filed Weights   10/15/20 1002  Weight: 164 lb (74.4 kg)    Physical Exam Constitutional:      Comments: Walking independently but with a limp because of left hip arthritis.  He is alone.  HENT:     Head: Normocephalic and atraumatic.     Mouth/Throat:     Pharynx: No oropharyngeal exudate.  Eyes:     Pupils: Pupils are equal, round, and reactive to light.  Cardiovascular:     Rate and Rhythm: Normal rate and regular rhythm.  Pulmonary:     Effort: Pulmonary effort is normal. No respiratory distress.     Breath sounds: Normal breath sounds. No wheezing.  Abdominal:     General: Bowel sounds are normal. There is no distension.      Palpations: Abdomen is soft. There is no mass.     Tenderness: There is no abdominal tenderness. There is no guarding or rebound.  Musculoskeletal:        General: No tenderness. Normal range of motion.     Cervical back: Normal range of motion and neck supple.  Skin:    General: Skin is warm.     Comments: Rash noted on the back.   Neurological:     Mental Status: He is alert and oriented to person, place, and time.  Psychiatric:        Mood and Affect: Affect normal.      LABORATORY DATA:  I have reviewed the data as listed Lab Results  Component Value Date   WBC 3.1 (L) 10/15/2020   HGB 12.4 (L) 10/15/2020   HCT 36.1 (L) 10/15/2020   MCV 94.5 10/15/2020   PLT 186 10/15/2020   Recent Labs    10/01/20 0835 10/08/20 0840 10/15/20 0841  NA 137 136 136  K 4.0 4.1 4.2  CL 104 104 103  CO2 25 21* 25  GLUCOSE 113* 145* 148*  BUN 16 17 18   CREATININE 0.81 0.91 1.01  CALCIUM 8.6* 8.6* 8.6*  GFRNONAA >60 >60 >60  PROT 6.7 6.7 7.0  ALBUMIN 3.8 3.8 4.0  AST 25 26 31   ALT 20 20 29   ALKPHOS 88 97 109  BILITOT 0.7 0.9 0.9    RADIOGRAPHIC STUDIES: I have personally reviewed the radiological images as listed and agreed with the findings in the report. No results found.  ASSESSMENT & PLAN:   Cancer of lower lobe of left lung ([place]) # T2N3- Stage III-non-small cell lung cancer-[favor adenocarcinoma]- plan concurrent chemoradiation weekly carbotaxol with the daily radiation.[start 11/30]; table.  #Proceed with cycle #7 of carbotaxol; Labs today reviewed;  acceptable for treatment today ;RT- started on December 1]. Again 1 more cycle next week.  Proceed with chemo today. Will order CT scan at next visit. Also discussed r: Immunotherapy post chemo.   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   # Radiation dermatitis- G-1 ;STABLE.  on 1% hydrocortsione  #History of A. fib on Eliquis. STABLE.   # DISPOSITION:  # chemo today # in 1 week- labs- cbc/cmp;  carbo-taxol # follow up in  3 weeks-MD; labs- cbc/cmp; NO chemol- Dr.B  All questions were answered. The patient knows to call the clinic with any problems, questions or concerns.   [name], MD 10/16/2020 7:50 AM

## 2020-10-15 NOTE — Assessment & Plan Note (Addendum)
#   T2N3- Stage III-non-small cell lung cancer-[favor adenocarcinoma]- plan concurrent chemoradiation weekly carbotaxol with the daily radiation.[start 11/30]; table.  #Proceed with cycle #7 of carbotaxol; Labs today reviewed;  acceptable for treatment today ;RT- started on December 1]. Again 1 more cycle next week.  Proceed with chemo today. Will order CT scan at next visit. Also discussed r: Immunotherapy post chemo.   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   # Radiation dermatitis- G-1 ;STABLE.  on 1% hydrocortsione  #History of A. fib on Eliquis. STABLE.   # DISPOSITION:  # chemo today # in 1 week- labs- cbc/cmp; carbo-taxol # follow up in 3 weeks-MD; labs- cbc/cmp; NO chemol- Dr.B

## 2020-10-16 ENCOUNTER — Ambulatory Visit
Admission: RE | Admit: 2020-10-16 | Discharge: 2020-10-16 | Disposition: A | Discharge status: Home / Self Care | Payer: Medicare Other | Source: Ambulatory Visit | Attending: Radiation Oncology | Admitting: Radiation Oncology

## 2020-10-16 DIAGNOSIS — Z510 Encounter for antineoplastic radiation therapy: Secondary | ICD-10-CM | POA: Diagnosis not present

## 2020-10-17 ENCOUNTER — Ambulatory Visit
Admission: RE | Admit: 2020-10-17 | Discharge: 2020-10-17 | Disposition: A | Discharge status: Home / Self Care | Payer: Medicare Other | Source: Ambulatory Visit | Attending: Radiation Oncology | Admitting: Radiation Oncology

## 2020-10-17 DIAGNOSIS — Z510 Encounter for antineoplastic radiation therapy: Secondary | ICD-10-CM | POA: Diagnosis not present

## 2020-10-20 ENCOUNTER — Ambulatory Visit
Admission: RE | Admit: 2020-10-20 | Discharge: 2020-10-20 | Disposition: A | Discharge status: Home / Self Care | Payer: Medicare Other | Source: Ambulatory Visit | Attending: Radiation Oncology | Admitting: Radiation Oncology

## 2020-10-20 DIAGNOSIS — Z510 Encounter for antineoplastic radiation therapy: Secondary | ICD-10-CM | POA: Diagnosis not present

## 2020-10-21 ENCOUNTER — Ambulatory Visit
Admission: RE | Admit: 2020-10-21 | Discharge: 2020-10-21 | Disposition: A | Discharge status: Home / Self Care | Payer: Medicare Other | Source: Ambulatory Visit | Attending: Radiation Oncology | Admitting: Radiation Oncology

## 2020-10-21 DIAGNOSIS — Z510 Encounter for antineoplastic radiation therapy: Secondary | ICD-10-CM | POA: Diagnosis not present

## 2020-10-22 ENCOUNTER — Other Ambulatory Visit: Payer: Self-pay | Admitting: Internal Medicine

## 2020-10-22 ENCOUNTER — Ambulatory Visit
Admission: RE | Admit: 2020-10-22 | Discharge: 2020-10-22 | Disposition: A | Discharge status: Home / Self Care | Payer: Medicare Other | Source: Ambulatory Visit | Attending: Radiation Oncology | Admitting: Radiation Oncology

## 2020-10-22 ENCOUNTER — Inpatient Hospital Stay: Payer: Medicare Other

## 2020-10-22 ENCOUNTER — Other Ambulatory Visit: Payer: Self-pay

## 2020-10-22 VITALS — BP 116/54 | HR 63 | Temp 97.6°F | Resp 20 | Wt 165.1 lb

## 2020-10-22 DIAGNOSIS — C3432 Malignant neoplasm of lower lobe, left bronchus or lung: Secondary | ICD-10-CM

## 2020-10-22 DIAGNOSIS — Z510 Encounter for antineoplastic radiation therapy: Secondary | ICD-10-CM | POA: Diagnosis not present

## 2020-10-22 DIAGNOSIS — Z5111 Encounter for antineoplastic chemotherapy: Secondary | ICD-10-CM | POA: Diagnosis not present

## 2020-10-22 LAB — CBC WITH DIFFERENTIAL/PLATELET
Abs Immature Granulocytes: 0.02 10*3/uL (ref 0.00–0.07)
Basophils Absolute: 0 10*3/uL (ref 0.0–0.1)
Basophils Relative: 1 %
Eosinophils Absolute: 0 10*3/uL (ref 0.0–0.5)
Eosinophils Relative: 1 %
HCT: 32 % — ABNORMAL LOW (ref 39.0–52.0)
Hemoglobin: 11.3 g/dL — ABNORMAL LOW (ref 13.0–17.0)
Immature Granulocytes: 1 %
Lymphocytes Relative: 13 %
Lymphs Abs: 0.3 10*3/uL — ABNORMAL LOW (ref 0.7–4.0)
MCH: 33.5 pg (ref 26.0–34.0)
MCHC: 35.3 g/dL (ref 30.0–36.0)
MCV: 95 fL (ref 80.0–100.0)
Monocytes Absolute: 0.3 10*3/uL (ref 0.1–1.0)
Monocytes Relative: 14 %
Neutro Abs: 1.5 10*3/uL — ABNORMAL LOW (ref 1.7–7.7)
Neutrophils Relative %: 70 %
Platelets: 195 10*3/uL (ref 150–400)
RBC: 3.37 MIL/uL — ABNORMAL LOW (ref 4.22–5.81)
RDW: 15.1 % (ref 11.5–15.5)
WBC: 2.1 10*3/uL — ABNORMAL LOW (ref 4.0–10.5)
nRBC: 0 % (ref 0.0–0.2)

## 2020-10-22 LAB — COMPREHENSIVE METABOLIC PANEL
ALT: 19 U/L (ref 0–44)
AST: 23 U/L (ref 15–41)
Albumin: 3.7 g/dL (ref 3.5–5.0)
Alkaline Phosphatase: 95 U/L (ref 38–126)
Anion gap: 6 (ref 5–15)
BUN: 21 mg/dL (ref 8–23)
CO2: 25 mmol/L (ref 22–32)
Calcium: 8.6 mg/dL — ABNORMAL LOW (ref 8.9–10.3)
Chloride: 104 mmol/L (ref 98–111)
Creatinine, Ser: 0.93 mg/dL (ref 0.61–1.24)
GFR, Estimated: >60 mL/min (ref >60)
Glucose, Bld: 138 mg/dL — ABNORMAL HIGH (ref 70–99)
Potassium: 4.1 mmol/L (ref 3.5–5.1)
Sodium: 135 mmol/L (ref 135–145)
Total Bilirubin: 0.9 mg/dL (ref 0.3–1.2)
Total Protein: 6.5 g/dL (ref 6.5–8.1)

## 2020-10-22 MED ORDER — HEPARIN SOD (PORK) LOCK FLUSH 100 UNIT/ML IV SOLN
500.0000 [IU] | Freq: Once | INTRAVENOUS | Status: DC | PRN
  Filled 2020-10-22: qty 5

## 2020-10-22 MED ORDER — SODIUM CHLORIDE 0.9 % IV SOLN
45.0000 mg/m2 | Freq: Once | INTRAVENOUS | Status: AC
  Administered 2020-10-22: 90 mg via INTRAVENOUS
  Filled 2020-10-22: qty 15

## 2020-10-22 MED ORDER — DIPHENHYDRAMINE HCL 50 MG/ML IJ SOLN
50.0000 mg | Freq: Once | INTRAMUSCULAR | Status: AC
  Administered 2020-10-22: 50 mg via INTRAVENOUS
  Filled 2020-10-22: qty 1

## 2020-10-22 MED ORDER — SODIUM CHLORIDE 0.9% FLUSH
10.0000 mL | INTRAVENOUS | Status: DC | PRN
  Administered 2020-10-22: 10 mL via INTRAVENOUS
  Filled 2020-10-22: qty 10

## 2020-10-22 MED ORDER — SODIUM CHLORIDE 0.9 % IV SOLN
150.0000 mg | Freq: Once | INTRAVENOUS | Status: AC
  Administered 2020-10-22: 150 mg via INTRAVENOUS
  Filled 2020-10-22: qty 15

## 2020-10-22 MED ORDER — PALONOSETRON HCL INJECTION 0.25 MG/5ML
0.2500 mg | Freq: Once | INTRAVENOUS | Status: AC
  Administered 2020-10-22: 0.25 mg via INTRAVENOUS
  Filled 2020-10-22: qty 5

## 2020-10-22 MED ORDER — SODIUM CHLORIDE 0.9 % IV SOLN
20.0000 mg | Freq: Once | INTRAVENOUS | Status: AC
  Administered 2020-10-22: 20 mg via INTRAVENOUS
  Filled 2020-10-22: qty 20

## 2020-10-22 MED ORDER — HEPARIN SOD (PORK) LOCK FLUSH 100 UNIT/ML IV SOLN
500.0000 [IU] | Freq: Once | INTRAVENOUS | Status: AC
  Administered 2020-10-22: 500 [IU] via INTRAVENOUS
  Filled 2020-10-22: qty 5

## 2020-10-22 MED ORDER — FAMOTIDINE IN NACL 20-0.9 MG/50ML-% IV SOLN
20.0000 mg | Freq: Once | INTRAVENOUS | Status: AC
  Administered 2020-10-22: 20 mg via INTRAVENOUS
  Filled 2020-10-22: qty 50

## 2020-10-22 MED ORDER — SODIUM CHLORIDE 0.9 % IV SOLN
Freq: Once | INTRAVENOUS | Status: AC
  Filled 2020-10-22: qty 250

## 2020-10-22 NOTE — Progress Notes (Signed)
Patient received Taxol and Carboplatin per treatment plan.  Stable at discharge.

## 2020-10-23 ENCOUNTER — Ambulatory Visit
Admission: RE | Admit: 2020-10-23 | Discharge: 2020-10-23 | Disposition: A | Discharge status: Home / Self Care | Payer: Medicare Other | Source: Ambulatory Visit | Attending: Radiation Oncology | Admitting: Radiation Oncology

## 2020-10-23 ENCOUNTER — Ambulatory Visit: Payer: Medicare Other

## 2020-10-23 DIAGNOSIS — Z510 Encounter for antineoplastic radiation therapy: Secondary | ICD-10-CM | POA: Diagnosis not present

## 2020-10-24 ENCOUNTER — Ambulatory Visit
Admission: RE | Admit: 2020-10-24 | Discharge: 2020-10-24 | Disposition: A | Discharge status: Home / Self Care | Payer: Medicare Other | Source: Ambulatory Visit | Attending: Radiation Oncology | Admitting: Radiation Oncology

## 2020-10-24 DIAGNOSIS — Z510 Encounter for antineoplastic radiation therapy: Secondary | ICD-10-CM | POA: Diagnosis not present

## 2020-10-27 ENCOUNTER — Ambulatory Visit: Payer: Medicare Other

## 2020-10-27 ENCOUNTER — Ambulatory Visit
Admission: RE | Admit: 2020-10-27 | Discharge: 2020-10-27 | Disposition: A | Discharge status: Home / Self Care | Payer: Medicare Other | Source: Ambulatory Visit | Attending: Radiation Oncology | Admitting: Radiation Oncology

## 2020-10-27 DIAGNOSIS — Z510 Encounter for antineoplastic radiation therapy: Secondary | ICD-10-CM | POA: Diagnosis not present

## 2020-10-28 ENCOUNTER — Ambulatory Visit: Payer: Medicare Other

## 2020-10-31 ENCOUNTER — Telehealth: Payer: Self-pay | Admitting: *Deleted

## 2020-10-31 ENCOUNTER — Telehealth: Payer: Self-pay | Admitting: Internal Medicine

## 2020-10-31 NOTE — Telephone Encounter (Signed)
Dr. Rogue Bussing has spoken to the patient's daughter, [name].

## 2020-10-31 NOTE — Telephone Encounter (Signed)
Per pt's daughter, pt is experiencing feeling a "knot" in his chest when swallowing and when breathing in and out. He has been taking the sucralfate but maybe not very consistently per his daughter. Pt is wanting any other recommendations to help relieve symptoms if possible.   Please advise.

## 2020-10-31 NOTE — Telephone Encounter (Signed)
On 1/28-I spoke to patient's daughter Jason Warren upon the description patient seems to be having radiation-induced esophagitis.  Recommend continued Carafate 3-4 times a day prior to meals; and also eating soft/liquid diet increasing protein supplements/boost Ensure.  If not improved or worse to call us/for further evaluation and symptom management clinic early next week.  Daughter in agreement.

## 2020-10-31 NOTE — Telephone Encounter (Signed)
Wife called reporting that patient has a knotty feeling in his chest and that he has a pill for it, but it is not helping and is asking for something to help with the discomfort. Please advise

## 2020-10-31 NOTE — Telephone Encounter (Signed)
Patient also called Jason Warren. See previous msg

## 2020-11-05 ENCOUNTER — Other Ambulatory Visit: Payer: Self-pay

## 2020-11-05 ENCOUNTER — Inpatient Hospital Stay: Payer: Medicare Other | Attending: Internal Medicine

## 2020-11-05 ENCOUNTER — Inpatient Hospital Stay (HOSPITAL_BASED_OUTPATIENT_CLINIC_OR_DEPARTMENT_OTHER): Payer: Medicare Other | Admitting: Internal Medicine

## 2020-11-05 VITALS — BP 130/67 | HR 70 | Temp 99.9°F | Resp 18

## 2020-11-05 DIAGNOSIS — C3432 Malignant neoplasm of lower lobe, left bronchus or lung: Secondary | ICD-10-CM | POA: Diagnosis not present

## 2020-11-05 DIAGNOSIS — L598 Other specified disorders of the skin and subcutaneous tissue related to radiation: Secondary | ICD-10-CM | POA: Insufficient documentation

## 2020-11-05 DIAGNOSIS — I4891 Unspecified atrial fibrillation: Secondary | ICD-10-CM | POA: Diagnosis not present

## 2020-11-05 DIAGNOSIS — K208 Other esophagitis without bleeding: Secondary | ICD-10-CM | POA: Diagnosis not present

## 2020-11-05 DIAGNOSIS — Y842 Radiological procedure and radiotherapy as the cause of abnormal reaction of the patient, or of later complication, without mention of misadventure at the time of the procedure: Secondary | ICD-10-CM | POA: Diagnosis not present

## 2020-11-05 DIAGNOSIS — R5383 Other fatigue: Secondary | ICD-10-CM

## 2020-11-05 DIAGNOSIS — Z7901 Long term (current) use of anticoagulants: Secondary | ICD-10-CM | POA: Diagnosis not present

## 2020-11-05 LAB — COMPREHENSIVE METABOLIC PANEL
ALT: 20 U/L (ref 0–44)
AST: 30 U/L (ref 15–41)
Albumin: 3.5 g/dL (ref 3.5–5.0)
Alkaline Phosphatase: 117 U/L (ref 38–126)
Anion gap: 12 (ref 5–15)
BUN: 17 mg/dL (ref 8–23)
CO2: 24 mmol/L (ref 22–32)
Calcium: 8.5 mg/dL — ABNORMAL LOW (ref 8.9–10.3)
Chloride: 100 mmol/L (ref 98–111)
Creatinine, Ser: 1.01 mg/dL (ref 0.61–1.24)
GFR, Estimated: >60 mL/min (ref >60)
Glucose, Bld: 144 mg/dL — ABNORMAL HIGH (ref 70–99)
Potassium: 4.2 mmol/L (ref 3.5–5.1)
Sodium: 136 mmol/L (ref 135–145)
Total Bilirubin: 0.9 mg/dL (ref 0.3–1.2)
Total Protein: 6.7 g/dL (ref 6.5–8.1)

## 2020-11-05 LAB — CBC WITH DIFFERENTIAL/PLATELET
Abs Immature Granulocytes: 0.1 10*3/uL — ABNORMAL HIGH (ref 0.00–0.07)
Basophils Absolute: 0 10*3/uL (ref 0.0–0.1)
Basophils Relative: 1 %
Eosinophils Absolute: 0 10*3/uL (ref 0.0–0.5)
Eosinophils Relative: 1 %
HCT: 31.4 % — ABNORMAL LOW (ref 39.0–52.0)
Hemoglobin: 10.6 g/dL — ABNORMAL LOW (ref 13.0–17.0)
Immature Granulocytes: 3 %
Lymphocytes Relative: 16 %
Lymphs Abs: 0.5 10*3/uL — ABNORMAL LOW (ref 0.7–4.0)
MCH: 32.9 pg (ref 26.0–34.0)
MCHC: 33.8 g/dL (ref 30.0–36.0)
MCV: 97.5 fL (ref 80.0–100.0)
Monocytes Absolute: 0.9 10*3/uL (ref 0.1–1.0)
Monocytes Relative: 29 %
Neutro Abs: 1.5 10*3/uL — ABNORMAL LOW (ref 1.7–7.7)
Neutrophils Relative %: 50 %
Platelets: 199 10*3/uL (ref 150–400)
RBC: 3.22 MIL/uL — ABNORMAL LOW (ref 4.22–5.81)
RDW: 16.3 % — ABNORMAL HIGH (ref 11.5–15.5)
WBC: 3 10*3/uL — ABNORMAL LOW (ref 4.0–10.5)
nRBC: 0 % (ref 0.0–0.2)

## 2020-11-05 LAB — TSH: TSH: 2.147 u[IU]/mL (ref 0.350–4.500)

## 2020-11-05 NOTE — Progress Notes (Signed)
[facility] NOTE  Patient Care Team: Baxter Hire, MD as PCP - General (Internal Medicine) [name], RN as Oncology Nurse Navigator [name], MD as Consulting Physician (Hematology and Oncology)  CHIEF COMPLAINTS/PURPOSE OF CONSULTATION: Lung cancer  #  Oncology History Overview Note  # NOV 2021- LEFT LOWER LOBE NON-SMALL CELL CA [favor adeno ca]; T2N3 [right hilar; subcarinal LN;[name]; NOV 2021-MRI [place]  # 11/30- carbo-Taxol-RT [RT until 10/23/20]   # A.fib [Eliquis; Dr.Kowalski]  # # SURVIVORSHIP:   # GENETICS:   #NGS-ordered  DIAGNOSIS: Lung cancer  STAGE:   III      ;  GOALS:  cure  CURRENT/MOST RECENT THERAPY : carbo-Taxol-RT ]   Cancer of lower lobe of left lung ([place])  08/14/2020 Initial Diagnosis   Cancer of lower lobe of left lung ([place])   09/02/2020 -  Chemotherapy    Patient is on Treatment Plan: LUNG CARBOPLATIN / PACLITAXEL + XRT Q7D      10/16/2020 Cancer Staging   Staging form: Lung, AJCC 8th Edition - Clinical: Stage IIIB (cT2a, cN3, cM0) - Signed by [name], MD on 10/16/2020      HISTORY OF PRESENTING ILLNESS:  Jason Warren 87 y.o.  male patient with stage III lung non-small cell lung-currently s/p concurrent chemoradiation for follow-up.  Patient finished chemoradiation therapy approximately 2 weeks ago.  Continues to complain of fatigue. Intermittent difficulty swallowing improved on Carafate. Complains of rash on the back/radiation portal using Silvadene ointment  Denies any unusual cough or chest pain. Mild swelling in legs.  Review of Systems  Constitutional: Negative for chills, diaphoresis, fever, malaise/fatigue and weight loss.  HENT: Negative for nosebleeds and sore throat.   Eyes: Negative for double vision.  Respiratory: Positive for cough. Negative for hemoptysis, sputum production, shortness of breath and wheezing.   Cardiovascular: Negative for chest pain,  palpitations, orthopnea and leg swelling.  Gastrointestinal: Negative for abdominal pain, blood in stool, constipation, diarrhea, heartburn, melena, nausea and vomiting.  Genitourinary: Negative for dysuria, frequency and urgency.  Musculoskeletal: Negative for back pain and joint pain.  Skin: Positive for rash. Negative for itching.  Neurological: Negative for tingling, focal weakness, weakness and headaches.  Endo/Heme/Allergies: Does not bruise/bleed easily.  Psychiatric/Behavioral: Negative for depression. The patient is not nervous/anxious and does not have insomnia.      MEDICAL HISTORY:  Past Medical History:  Diagnosis Date  . Arthritis   . Dysrhythmia    A-fib  . Hip pain    left  . HOH (hard of hearing)   . LBBB (left bundle branch block) 08/05/2020  . Skin cancer, basal cell     face top of head    SURGICAL HISTORY: Past Surgical History:  Procedure Laterality Date  . CATARACT EXTRACTION W/PHACO Right 07/13/2017   Procedure: CATARACT EXTRACTION PHACO AND INTRAOCULAR LENS PLACEMENT ([place]);  Surgeon: Leandrew Koyanagi, MD;  Location: [facility];  Service: Ophthalmology;  Laterality: Right;  IVA TOPICAL RIGHT  . CATARACT EXTRACTION W/PHACO Left 01/04/2018   Procedure: CATARACT EXTRACTION PHACO AND INTRAOCULAR LENS PLACEMENT ([place]) LEFT;  Surgeon: Leandrew Koyanagi, MD;  Location: [facility];  Service: Ophthalmology;  Laterality: Left;  . COLONOSCOPY    . IR IMAGING GUIDED PORT INSERTION  08/27/2020  . JOINT REPLACEMENT     Left total hip Dr. Su Hoff 08-04-18  . ROTATOR CUFF REPAIR Right   . SKIN CANCER EXCISION     face  . TOTAL HIP ARTHROPLASTY Left  08/04/2018   Procedure: TOTAL HIP ARTHROPLASTY ANTERIOR APPROACH;  Surgeon: Frederik Pear, MD;  [facility];  Service: Orthopedics;  Laterality: Left;  Marland Kitchen VIDEO BRONCHOSCOPY WITH ENDOBRONCHIAL NAVIGATION N/A 08/07/2020   Procedure: VIDEO BRONCHOSCOPY WITH ENDOBRONCHIAL NAVIGATION;  Surgeon:  Ottie Glazier, MD;  Location: [facility];  Service: Thoracic;  Laterality: N/A;  . VIDEO BRONCHOSCOPY WITH ENDOBRONCHIAL ULTRASOUND N/A 08/07/2020   Procedure: VIDEO BRONCHOSCOPY WITH ENDOBRONCHIAL ULTRASOUND;  Surgeon: Ottie Glazier, MD;  Location: [facility];  Service: Thoracic;  Laterality: N/A;    SOCIAL HISTORY: Social History   Socioeconomic History  . Marital status: Married    Spouse name: Not on file  . Number of children: Not on file  . Years of education: Not on file  . Highest education level: Not on file  Occupational History  . Not on file  Tobacco Use  . Smoking status: Former Smoker    Packs/day: 1.00    Years: 4.00    Pack years: 4.00    Types: Cigarettes  . Smokeless tobacco: Never Used  . Tobacco comment: quit early 70's  Vaping Use  . Vaping Use: Never used  Substance and Sexual Activity  . Alcohol use: No  . Drug use: No  . Sexual activity: Not Currently  Other Topics Concern  . Not on file  Social History Narrative   > quit 35 years; smoked for 15 years. Rare alcohol. 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    Social Determinants of Health   Financial Resource Strain: Not on file  Food Insecurity: Not on file  Transportation Needs: Not on file  Physical Activity: Not on file  Stress: Not on file  Social Connections: Not on file  Intimate Partner Violence: Not on file    FAMILY HISTORY: Family History  Problem Relation Age of Onset  . Throat cancer Brother         & lung cancer    ALLERGIES:  has No Known Allergies.  MEDICATIONS:  Current Outpatient Medications  Medication Sig Dispense Refill  . acetaminophen (TYLENOL) 500 MG tablet Take 500 mg by mouth every 8 (eight) hours as needed for moderate pain.     Marland Kitchen apixaban (ELIQUIS) 5 MG TABS tablet Take 5 mg by mouth 2 (two) times daily.    . hydroxypropyl methylcellulose / hypromellose (ISOPTO TEARS / GONIOVISC) 2.5 %  ophthalmic solution Place 1 drop into the left eye daily as needed for dry eyes.    Marland Kitchen lidocaine-prilocaine (EMLA) cream Apply 1 application topically as needed. Apply small amount to port site at least 1 hour prior to it being accessed, cover with plastic wrap 30 g 1  . Multiple Vitamin (MULTIVITAMIN) tablet Take 1 tablet by mouth daily.    Vladimir Faster Glycol-Propyl Glycol (SYSTANE FREE OP) Apply to eye.    . pravastatin (PRAVACHOL) 80 MG tablet Take 80 mg by mouth at bedtime.     . saw palmetto 160 MG capsule Take 160 mg by mouth 2 (two) times daily.    . silver sulfADIAZINE (SILVADENE) 1 % cream Apply 1 application topically 2 (two) times daily. 85 g 2  . sucralfate (CARAFATE) 1 g tablet Take 1 tablet (1 g total) by mouth 4 (four) times daily -  with meals and at bedtime. 90 tablet 1  . ondansetron (ZOFRAN) 8 MG tablet One pill every 8 hours as needed for nausea/vomitting. (Patient not taking: Reported on 11/05/2020)  40 tablet 1  . prochlorperazine (COMPAZINE) 10 MG tablet Take 1 tablet (10 mg total) by mouth every 6 (six) hours as needed for nausea or vomiting. (Patient not taking: Reported on 11/05/2020) 40 tablet 1   No current facility-administered medications for this visit.   PHYSICAL EXAMINATION: ECOG PERFORMANCE STATUS: 1 - Symptomatic but completely ambulatory  Vitals:   11/05/20 1315  BP: 130/67  Pulse: 70  Resp: 18  Temp: 99.9 F (37.7 C)   There were no vitals filed for this visit.  Physical Exam Constitutional:      Comments: Walking independently but with a limp because of left hip arthritis.  He is alone.  HENT:     Head: Normocephalic and atraumatic.     Mouth/Throat:     Pharynx: No oropharyngeal exudate.  Eyes:     Pupils: Pupils are equal, round, and reactive to light.  Cardiovascular:     Rate and Rhythm: Normal rate and regular rhythm.  Pulmonary:     Effort: Pulmonary effort is normal. No respiratory distress.     Breath sounds: Normal breath sounds. No  wheezing.  Abdominal:     General: Bowel sounds are normal. There is no distension.     Palpations: Abdomen is soft. There is no mass.     Tenderness: There is no abdominal tenderness. There is no guarding or rebound.  Musculoskeletal:        General: No tenderness. Normal range of motion.     Cervical back: Normal range of motion and neck supple.  Skin:    General: Skin is warm.     Comments: Area of desquamation in the radiation portal on the back. Covered by Band-Aid. No signs of infection.  Neurological:     Mental Status: He is alert and oriented to person, place, and time.  Psychiatric:        Mood and Affect: Affect normal.      LABORATORY DATA:  I have reviewed the data as listed Lab Results  Component Value Date   WBC 3.0 (L) 11/05/2020   HGB 10.6 (L) 11/05/2020   HCT 31.4 (L) 11/05/2020   MCV 97.5 11/05/2020   PLT 199 11/05/2020   Recent Labs    10/15/20 0841 10/22/20 0907 11/05/20 1255  NA 136 135 136  K 4.2 4.1 4.2  CL 103 104 100  CO2 25 25 24   GLUCOSE 148* 138* 144*  BUN 18 21 17   CREATININE 1.01 0.93 1.01  CALCIUM 8.6* 8.6* 8.5*  GFRNONAA >60 >60 >60  PROT 7.0 6.5 6.7  ALBUMIN 4.0 3.7 3.5  AST 31 23 30   ALT 29 19 20   ALKPHOS 109 95 117  BILITOT 0.9 0.9 0.9    RADIOGRAPHIC STUDIES: I have personally reviewed the radiological images as listed and agreed with the findings in the report. No results found.  ASSESSMENT & PLAN:   Cancer of lower lobe of left lung ([place]) # T2N3- Stage III-non-small cell lung cancer-[favor adenocarcinoma]- s/p  concurrent chemoradiation weekly carbotaxol with the daily radiation.Derrill Memo 11/30; finsihed 2 weeka go]; STABLE.  # s/p carbo-Taxol-RT; Will order CT scan today; Also discussed re: Immunotherapy post chemo.   # Radiation esophagitis-2;on Carafate; STABLE.   # Radiation dermatitis- G-2 ;STABLE.  On silvadene iontment BID.   #History of A. fib on Eliquis. STABLE.    # Cold intolerance- check TSH.   # Bil  LE swelling- clinically not CHF; s/o dependant edema/ recommend elevation/compression stockings.   I spoke at  length with the patient's daughter, [name]- regarding the patient's clinical status/plan of care.  Family agreement.    # DISPOSITION: check TSH  # follow up in 4 weeks-MD; labs- cbc/cmp;port flush; CT chest prior- Dr.B    All questions were answered. The patient knows to call the clinic with any problems, questions or concerns.   [name], MD 11/06/2020 8:46 AM

## 2020-11-05 NOTE — Assessment & Plan Note (Addendum)
#   T2N3- Stage III-non-small cell lung cancer-[favor adenocarcinoma]- s/p  concurrent chemoradiation weekly carbotaxol with the daily radiation.Derrill Memo 11/30; finsihed 2 weeka go]; STABLE.  # s/p carbo-Taxol-RT; Will order CT scan today; Also discussed re: Immunotherapy post chemo.   # Radiation esophagitis-2;on Carafate; STABLE.   # Radiation dermatitis- G-2 ;STABLE.  On silvadene iontment BID.   #History of A. fib on Eliquis. STABLE.    # Cold intolerance- check TSH.   # Bil LE swelling- clinically not CHF; s/o dependant edema/ recommend elevation/compression stockings.   I spoke at length with the patient's daughter, [name]- regarding the patient's clinical status/plan of care.  Family agreement.    # DISPOSITION: check TSH  # follow up in 4 weeks-MD; labs- cbc/cmp;port flush; CT chest prior- Dr.B

## 2020-11-05 NOTE — Progress Notes (Unsigned)
Pt reports increase in fatigue and decrease appetite. He is able to do his household chairs with frequent breaks and rests. He reports bilateral ankle edema. He does not use compression stockings. Edema is not improved with elevation.

## 2020-11-06 ENCOUNTER — Telehealth: Payer: Self-pay | Admitting: Internal Medicine

## 2020-11-06 NOTE — Telephone Encounter (Signed)
On 2/2-I called patient's daughter Joan-updated her of the clinic visit.  Discussed regarding ongoing mild to moderate adverse events from therapy.  Also discussed regarding immunotherapy; potential mechanism of action/side effects.  Await CT scan for further decisions.  Thankful for the call.  GB

## 2020-11-17 ENCOUNTER — Telehealth: Payer: Self-pay | Admitting: *Deleted

## 2020-11-17 NOTE — Telephone Encounter (Signed)
Jenny/ Lauren- Please advise

## 2020-11-17 NOTE — Telephone Encounter (Signed)
Per pt's daughter, pt is unable to sleep well at night and requesting if a sleep aid can be prescribed for him.   Please advise.

## 2020-11-17 NOTE — Telephone Encounter (Signed)
RN Spoke with patient's daughter. MD would like patient to start Melatonin 5 mg at bedtime first before starting sedatives. Daughter agrees with plan of care and stated that she will have pt start with this first. She thanked me for calling her back.

## 2020-11-26 ENCOUNTER — Ambulatory Visit
Admission: RE | Admit: 2020-11-26 | Discharge: 2020-11-26 | Disposition: A | Discharge status: Home / Self Care | Payer: Medicare Other | Source: Ambulatory Visit | Attending: Internal Medicine | Admitting: Internal Medicine

## 2020-11-26 ENCOUNTER — Other Ambulatory Visit: Payer: Self-pay

## 2020-11-26 DIAGNOSIS — C3432 Malignant neoplasm of lower lobe, left bronchus or lung: Secondary | ICD-10-CM | POA: Diagnosis present

## 2020-11-26 MED ORDER — IOHEXOL 300 MG/ML  SOLN
75.0000 mL | Freq: Once | INTRAMUSCULAR | Status: AC | PRN
  Administered 2020-11-26: 75 mL via INTRAVENOUS

## 2020-11-27 ENCOUNTER — Other Ambulatory Visit: Payer: Medicare Other

## 2020-11-27 ENCOUNTER — Ambulatory Visit: Payer: Medicare Other | Admitting: Internal Medicine

## 2020-12-03 ENCOUNTER — Encounter: Payer: Self-pay | Admitting: Internal Medicine

## 2020-12-03 ENCOUNTER — Inpatient Hospital Stay (HOSPITAL_BASED_OUTPATIENT_CLINIC_OR_DEPARTMENT_OTHER): Payer: Medicare Other | Admitting: Internal Medicine

## 2020-12-03 ENCOUNTER — Other Ambulatory Visit: Payer: Self-pay

## 2020-12-03 ENCOUNTER — Inpatient Hospital Stay: Payer: Medicare Other | Attending: Internal Medicine

## 2020-12-03 DIAGNOSIS — Z7901 Long term (current) use of anticoagulants: Secondary | ICD-10-CM | POA: Insufficient documentation

## 2020-12-03 DIAGNOSIS — I4891 Unspecified atrial fibrillation: Secondary | ICD-10-CM | POA: Diagnosis not present

## 2020-12-03 DIAGNOSIS — J700 Acute pulmonary manifestations due to radiation: Secondary | ICD-10-CM | POA: Diagnosis not present

## 2020-12-03 DIAGNOSIS — R1310 Dysphagia, unspecified: Secondary | ICD-10-CM | POA: Insufficient documentation

## 2020-12-03 DIAGNOSIS — C3432 Malignant neoplasm of lower lobe, left bronchus or lung: Secondary | ICD-10-CM | POA: Diagnosis present

## 2020-12-03 DIAGNOSIS — Z95828 Presence of other vascular implants and grafts: Secondary | ICD-10-CM

## 2020-12-03 DIAGNOSIS — Z5112 Encounter for antineoplastic immunotherapy: Secondary | ICD-10-CM | POA: Diagnosis present

## 2020-12-03 LAB — CBC WITH DIFFERENTIAL/PLATELET
Abs Immature Granulocytes: 0.01 10*3/uL (ref 0.00–0.07)
Basophils Absolute: 0 10*3/uL (ref 0.0–0.1)
Basophils Relative: 1 %
Eosinophils Absolute: 0.2 10*3/uL (ref 0.0–0.5)
Eosinophils Relative: 3 %
HCT: 37.1 % — ABNORMAL LOW (ref 39.0–52.0)
Hemoglobin: 12.3 g/dL — ABNORMAL LOW (ref 13.0–17.0)
Immature Granulocytes: 0 %
Lymphocytes Relative: 17 %
Lymphs Abs: 1 10*3/uL (ref 0.7–4.0)
MCH: 33 pg (ref 26.0–34.0)
MCHC: 33.2 g/dL (ref 30.0–36.0)
MCV: 99.5 fL (ref 80.0–100.0)
Monocytes Absolute: 0.7 10*3/uL (ref 0.1–1.0)
Monocytes Relative: 12 %
Neutro Abs: 3.9 10*3/uL (ref 1.7–7.7)
Neutrophils Relative %: 67 %
Platelets: 287 10*3/uL (ref 150–400)
RBC: 3.73 MIL/uL — ABNORMAL LOW (ref 4.22–5.81)
RDW: 15.6 % — ABNORMAL HIGH (ref 11.5–15.5)
WBC: 5.8 10*3/uL (ref 4.0–10.5)
nRBC: 0 % (ref 0.0–0.2)

## 2020-12-03 LAB — COMPREHENSIVE METABOLIC PANEL
ALT: 17 U/L (ref 0–44)
AST: 26 U/L (ref 15–41)
Albumin: 3.6 g/dL (ref 3.5–5.0)
Alkaline Phosphatase: 139 U/L — ABNORMAL HIGH (ref 38–126)
Anion gap: 10 (ref 5–15)
BUN: 17 mg/dL (ref 8–23)
CO2: 25 mmol/L (ref 22–32)
Calcium: 8.9 mg/dL (ref 8.9–10.3)
Chloride: 102 mmol/L (ref 98–111)
Creatinine, Ser: 0.93 mg/dL (ref 0.61–1.24)
GFR, Estimated: >60 mL/min (ref >60)
Glucose, Bld: 103 mg/dL — ABNORMAL HIGH (ref 70–99)
Potassium: 4.4 mmol/L (ref 3.5–5.1)
Sodium: 137 mmol/L (ref 135–145)
Total Bilirubin: 0.8 mg/dL (ref 0.3–1.2)
Total Protein: 6.9 g/dL (ref 6.5–8.1)

## 2020-12-03 MED ORDER — SODIUM CHLORIDE 0.9% FLUSH
10.0000 mL | INTRAVENOUS | Status: DC | PRN
  Administered 2020-12-03: 10 mL via INTRAVENOUS
  Filled 2020-12-03: qty 10

## 2020-12-03 MED ORDER — HEPARIN SOD (PORK) LOCK FLUSH 100 UNIT/ML IV SOLN
500.0000 [IU] | Freq: Once | INTRAVENOUS | Status: AC
  Administered 2020-12-03: 500 [IU] via INTRAVENOUS
  Filled 2020-12-03: qty 5

## 2020-12-03 MED ORDER — HEPARIN SOD (PORK) LOCK FLUSH 100 UNIT/ML IV SOLN
INTRAVENOUS | Status: AC
  Filled 2020-12-03: qty 5

## 2020-12-03 NOTE — Progress Notes (Signed)
DISCONTINUE ON PATHWAY REGIMEN - Non-Small Cell Lung     Administer weekly:     Paclitaxel      Carboplatin   **Always confirm dose/schedule in your pharmacy ordering system**  REASON: Continuation Of Treatment PRIOR TREATMENT: [id number]: Carboplatin AUC=2 + Paclitaxel 45 mg/m2 Weekly During Radiation TREATMENT RESPONSE: Partial Response (PR)  START ON PATHWAY REGIMEN - Non-Small Cell Lung     A cycle is every 14 days:     Durvalumab   **Always confirm dose/schedule in your pharmacy ordering system**  Patient Characteristics: Preoperative or Nonsurgical Candidate (Clinical Staging), Stage III - Nonsurgical Candidate (Nonsquamous and Squamous), PS = 0, 1 Therapeutic Status: Preoperative or Nonsurgical Candidate (Clinical Staging) AJCC T Category: cT2a AJCC N Category: cN3 AJCC M Category: cM0 AJCC 8 Stage Grouping: IIIB ECOG Performance Status: 1 Intent of Therapy: Curative Intent, Discussed with Patient

## 2020-12-03 NOTE — Progress Notes (Signed)
[facility] NOTE  Patient Care Team: Baxter Hire, MD as PCP - General (Internal Medicine) [name], RN as Oncology Nurse Navigator [name], MD as Consulting Physician (Hematology and Oncology)  CHIEF COMPLAINTS/PURPOSE OF CONSULTATION: Lung cancer  #  Oncology History Overview Note  # NOV 2021- LEFT LOWER LOBE NON-SMALL CELL CA [favor adeno ca]; T2N3 [right hilar; subcarinal LN;[name]; NOV 2021-MRI Brain-NEG  # 11/30- carbo-Taxol-RT [RT until 10/23/20]   # A.fib [Eliquis; Dr.Kowalski]  # # SURVIVORSHIP:   # GENETICS:   #NGS-ordered  DIAGNOSIS: Lung cancer  STAGE:   III      ;  GOALS:  cure  CURRENT/MOST RECENT THERAPY : carbo-Taxol-RT ]   Cancer of lower lobe of left lung ([facility])  08/14/2020 Initial Diagnosis   Cancer of lower lobe of left lung ([place])   09/02/2020 - 10/22/2020 Chemotherapy    Patient is on Treatment Plan: LUNG DURVALUMAB Q14D      10/16/2020 Cancer Staging   Staging form: Lung, AJCC 8th Edition - Clinical: Stage IIIB (cT2a, cN3, cM0) - Signed by [name], MD on 10/16/2020   12/17/2020 -  Chemotherapy    Patient is on Treatment Plan: LUNG DURVALUMAB Q14D         HISTORY OF PRESENTING ILLNESS:  [name] 87 y.o.  male patient with stage III lung non-small cell lung-currently s/p concurrent chemoradiation for follow-up-is here today with results of his CT scan.  Patient finished chemoradiation therapy approximately 4 weeks ago.  Patient is skin rash in his back is improving.  Continues to apply Silvadene ointment.  No fever no chills.  No cough.  No chest pain. No Shortness of breath.  No swelling in the legs.  Review of Systems  Constitutional: Negative for chills, diaphoresis, fever, malaise/fatigue and weight loss.  HENT: Negative for nosebleeds and sore throat.   Eyes: Negative for double vision.  Respiratory: Positive for cough. Negative for hemoptysis, sputum production,  shortness of breath and wheezing.   Cardiovascular: Negative for chest pain, palpitations, orthopnea and leg swelling.  Gastrointestinal: Negative for abdominal pain, blood in stool, constipation, diarrhea, heartburn, melena, nausea and vomiting.  Genitourinary: Negative for dysuria, frequency and urgency.  Musculoskeletal: Negative for back pain and joint pain.  Skin: Positive for rash. Negative for itching.  Neurological: Negative for tingling, focal weakness, weakness and headaches.  Endo/Heme/Allergies: Does not bruise/bleed easily.  Psychiatric/Behavioral: Negative for depression. The patient is not nervous/anxious and does not have insomnia.      MEDICAL HISTORY:  Past Medical History:  Diagnosis Date  . Arthritis   . Dysrhythmia    A-fib  . Hip pain    left  . HOH (hard of hearing)   . LBBB (left bundle branch block) 08/05/2020  . Skin cancer, basal cell     face top of head    SURGICAL HISTORY: Past Surgical History:  Procedure Laterality Date  . CATARACT EXTRACTION W/PHACO Right 07/13/2017   Procedure: CATARACT EXTRACTION PHACO AND INTRAOCULAR LENS PLACEMENT ([place]);  Surgeon: Leandrew Koyanagi, MD;  Location: [facility];  Service: Ophthalmology;  Laterality: Right;  IVA TOPICAL RIGHT  . CATARACT EXTRACTION W/PHACO Left 01/04/2018   Procedure: CATARACT EXTRACTION PHACO AND INTRAOCULAR LENS PLACEMENT ([place]) LEFT;  Surgeon: Leandrew Koyanagi, MD;  Location: [facility];  Service: Ophthalmology;  Laterality: Left;  . COLONOSCOPY    . IR IMAGING GUIDED PORT INSERTION  08/27/2020  . JOINT REPLACEMENT     Left total  hip Dr. Su Hoff 08-04-18  . ROTATOR CUFF REPAIR Right   . SKIN CANCER EXCISION     face  . TOTAL HIP ARTHROPLASTY Left 08/04/2018   Procedure: TOTAL HIP ARTHROPLASTY ANTERIOR APPROACH;  Surgeon: Frederik Pear, MD;  Location: [facility];  Service: Orthopedics;  Laterality: Left;  Marland Kitchen VIDEO BRONCHOSCOPY WITH ENDOBRONCHIAL NAVIGATION N/A 08/07/2020    Procedure: VIDEO BRONCHOSCOPY WITH ENDOBRONCHIAL NAVIGATION;  Surgeon: Ottie Glazier, MD;  Location: [facility];  Service: Thoracic;  Laterality: N/A;  . VIDEO BRONCHOSCOPY WITH ENDOBRONCHIAL ULTRASOUND N/A 08/07/2020   Procedure: VIDEO BRONCHOSCOPY WITH ENDOBRONCHIAL ULTRASOUND;  Surgeon: Ottie Glazier, MD;  Location: [facility];  Service: Thoracic;  Laterality: N/A;    SOCIAL HISTORY: Social History   Socioeconomic History  . Marital status: Married    Spouse name: Not on file  . Number of children: Not on file  . Years of education: Not on file  . Highest education level: Not on file  Occupational History  . Not on file  Tobacco Use  . Smoking status: Former Smoker    Packs/day: 1.00    Years: 4.00    Pack years: 4.00    Types: Cigarettes  . Smokeless tobacco: Never Used  . Tobacco comment: quit early 70's  Vaping Use  . Vaping Use: Never used  Substance and Sexual Activity  . Alcohol use: No  . Drug use: No  . Sexual activity: Not Currently  Other Topics Concern  . Not on file  Social History Narrative   > quit 35 years; smoked for 15 years. Rare alcohol. 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    Social Determinants of Health   Financial Resource Strain: Not on file  Food Insecurity: Not on file  Transportation Needs: Not on file  Physical Activity: Not on file  Stress: Not on file  Social Connections: Not on file  Intimate Partner Violence: Not on file    FAMILY HISTORY: Family History  Problem Relation Age of Onset  . Throat cancer Brother         & lung cancer    ALLERGIES:  has No Known Allergies.  MEDICATIONS:  Current Outpatient Medications  Medication Sig Dispense Refill  . apixaban (ELIQUIS) 5 MG TABS tablet Take 5 mg by mouth 2 (two) times daily.    . hydroxypropyl methylcellulose / hypromellose (ISOPTO TEARS / GONIOVISC) 2.5 % ophthalmic solution Place 1 drop into the left eye  daily as needed for dry eyes.    . Multiple Vitamin (MULTIVITAMIN) tablet Take 1 tablet by mouth daily.    Vladimir Faster Glycol-Propyl Glycol (SYSTANE FREE OP) Apply to eye.    . pravastatin (PRAVACHOL) 80 MG tablet Take 80 mg by mouth at bedtime.     . prochlorperazine (COMPAZINE) 10 MG tablet Take 1 tablet (10 mg total) by mouth every 6 (six) hours as needed for nausea or vomiting. 40 tablet 1  . saw palmetto 160 MG capsule Take 160 mg by mouth 2 (two) times daily.    . silver sulfADIAZINE (SILVADENE) 1 % cream Apply 1 application topically 2 (two) times daily. 85 g 2  . sucralfate (CARAFATE) 1 g tablet Take 1 tablet (1 g total) by mouth 4 (four) times daily -  with meals and at bedtime. 90 tablet 1  . acetaminophen (TYLENOL) 500 MG tablet Take 500 mg by mouth every 8 (eight) hours as needed for moderate pain.  (Patient  not taking: Reported on 12/03/2020)    . lidocaine-prilocaine (EMLA) cream Apply 1 application topically as needed. Apply small amount to port site at least 1 hour prior to it being accessed, cover with plastic wrap (Patient not taking: Reported on 12/03/2020) 30 g 1  . ondansetron (ZOFRAN) 8 MG tablet One pill every 8 hours as needed for nausea/vomitting. (Patient not taking: No sig reported) 40 tablet 1   No current facility-administered medications for this visit.   PHYSICAL EXAMINATION: ECOG PERFORMANCE STATUS: 1 - Symptomatic but completely ambulatory  Vitals:   12/03/20 1000  BP: 133/62  Pulse: 72  Resp: 18  Temp: (!) 96.1 F (35.6 C)  SpO2: 100%   There were no vitals filed for this visit.  Physical Exam Constitutional:      Comments: Walking independently but with a limp because of left hip arthritis.  He is alone.  HENT:     Head: Normocephalic and atraumatic.     Mouth/Throat:     Pharynx: No oropharyngeal exudate.  Eyes:     Pupils: Pupils are equal, round, and reactive to light.  Cardiovascular:     Rate and Rhythm: Normal rate and regular rhythm.   Pulmonary:     Effort: Pulmonary effort is normal. No respiratory distress.     Breath sounds: Normal breath sounds. No wheezing.  Abdominal:     General: Bowel sounds are normal. There is no distension.     Palpations: Abdomen is soft. There is no mass.     Tenderness: There is no abdominal tenderness. There is no guarding or rebound.  Musculoskeletal:        General: No tenderness. Normal range of motion.     Cervical back: Normal range of motion and neck supple.  Skin:    General: Skin is warm.     Comments: Area of desquamation in the radiation portal on the back. Covered by Band-Aid. No signs of infection.  Neurological:     Mental Status: He is alert and oriented to person, place, and time.  Psychiatric:        Mood and Affect: Affect normal.      LABORATORY DATA:  I have reviewed the data as listed Lab Results  Component Value Date   WBC 5.8 12/03/2020   HGB 12.3 (L) 12/03/2020   HCT 37.1 (L) 12/03/2020   MCV 99.5 12/03/2020   PLT 287 12/03/2020   Recent Labs    10/22/20 0907 11/05/20 1255 12/03/20 0934  NA 135 136 137  K 4.1 4.2 4.4  CL 104 100 102  CO2 25 24 25   GLUCOSE 138* 144* 103*  BUN 21 17 17   CREATININE 0.93 1.01 0.93  CALCIUM 8.6* 8.5* 8.9  GFRNONAA >60 >60 >60  PROT 6.5 6.7 6.9  ALBUMIN 3.7 3.5 3.6  AST 23 30 26   ALT 19 20 17   ALKPHOS 95 117 139*  BILITOT 0.9 0.9 0.8    RADIOGRAPHIC STUDIES: I have personally reviewed the radiological images as listed and agreed with the findings in the report. CT Chest W Contrast  Result Date: 11/27/2020 CLINICAL DATA:  Metastatic non-small cell lung cancer, assess treatment response status post chemo radiation EXAM: CT CHEST WITH CONTRAST TECHNIQUE: Multidetector CT imaging of the chest was performed during intravenous contrast administration. CONTRAST:  69mL OMNIPAQUE IOHEXOL 300 MG/ML  SOLN COMPARISON:  08/04/2020 FINDINGS: Cardiovascular: Aortic atherosclerosis. Normal heart size. Scattered coronary  artery calcifications. No pericardial effusion. Mediastinum/Nodes: Interval resolution of previously seen left hilar  lymphadenopathy. Decrease in size of abnormally enlarged left hilar/left paratracheal lymph node conglomerate, measuring approximately 2.6 x 1.5 cm, previously 3.0 x 1.6 cm (series 2, image 74). Thyroid gland, trachea, and esophagus demonstrate no significant findings. Lungs/Pleura: Significant interval decrease in size of a spiculated mass of the superior segment left lower lobe with new internal cavitation, lesion measuring 2.9 x 2.3 cm, previously 3.7 x 3.3 cm (series 3, image 100). No significant change in size and fullness of a cavitary lesion of the superior segment right lower lobe, measuring 4.0 x 3.2 cm (series 3, image 100). There is new multifocal consolidation and irregular opacity of the left upper lobe, most conspicuously in the paramedian suprahilar left lung (series 3, image 65). There is also minimal similar opacity seen in the paramedian right upper lobe (series 3, image 72). No pleural effusion or pneumothorax. Upper Abdomen: No acute abnormality. Musculoskeletal: No chest wall mass or suspicious bone lesions identified. IMPRESSION: 1. Significant interval decrease in size of a spiculated mass of the superior segment left lower lobe with new internal cavitation. 2. Interval improvement in left hilar and mediastinal lymphadenopathy. 3. Findings are consistent with treatment response. 4. A non FDG PET avid cavitary lesion of the superior segment right lower lobe is unchanged. 5. 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 6. Coronary artery disease. Aortic Atherosclerosis (ICD10-I70.0). Electronically Signed   By: Eddie Candle M.D.   On: 11/27/2020 10:44    ASSESSMENT & PLAN:   Cancer of lower lobe of left lung ([place]) # T2N3- Stage III-non-small cell lung cancer-[favor adenocarcinoma]- s/p  concurrent chemoradiation weekly carbotaxol with the daily  radiation.Derrill Memo 11/30; finished Feb 2022]; FEB 2022-  Significant interval decrease in size of a spiculated mass; left hilar and mediastinal lymphadenopathy; non-FDG avid right lower lobe mass stable.  #Recommend immunotherapy- Durvalumab every 2 weeks.  Discussed the goal of treatment is cure/although cure the chance of cure is small with stage III lung cancer.   I discussed the mechanism of action;and length of treatments is 12 months.   I reviewed the PACIFIC study in patient with stage III lung cancer.  The patients in comparison to placebo immunotherapy showed-improved progression free survival [16.9 vs.5.76m]; and improved median survival [47 vs.29 m].  I would recommend immunotherapy- Durvalumab every 2 weeks.   Discussed the potential side effects of immunotherapy including but not limited to diarrhea; skin rash; elevated LFTs/endocrine abnormalities etc.  # Radiation pneumonitis noted on CT scan grade 1 asymptomatic.  No steroids. STABLE.  # Radiation dermatitis- G-2 ;STABLE.  On silvadene iontment BID.   #History of A. fib on Eliquis. STABLE.   # I reviewed the blood work- with the patient in detail; also reviewed the imaging independently [as summarized above]; and with the patient in detail.  Patient and family had multiple questions about the imaging [also questioning authenticity of the imaging/results].  I did my best to help patient family understand the treatment course and plan.  I even offered a second opinion at a tertiary center; patient they declined.  Patient wants to proceed with treatment plan as detailed above.   # DISPOSITION:  # follow up in 2 weeks-MD; labs- cbc/cmp;IMFINZI [new]- Dr.B  # 40 minutes face-to-face with the patient discussing the above plan of care; more than 50% of time spent on prognosis/ natural history; counseling and coordination.  All questions were answered. The patient knows to call the clinic with any problems, questions or concerns.    [name],  MD 12/03/2020 2:13 PM

## 2020-12-03 NOTE — Assessment & Plan Note (Addendum)
#   T2N3- Stage III-non-small cell lung cancer-[favor adenocarcinoma]- s/p  concurrent chemoradiation weekly carbotaxol with the daily radiation.Derrill Memo 11/30; finished Feb 2022]; FEB 2022-  Significant interval decrease in size of a spiculated mass; left hilar and mediastinal lymphadenopathy; non-FDG avid right lower lobe mass stable.  #Recommend immunotherapy- Durvalumab every 2 weeks.  Discussed the goal of treatment is cure/although cure the chance of cure is small with stage III lung cancer.   I discussed the mechanism of action;and length of treatments is 12 months.   I reviewed the PACIFIC study in patient with stage III lung cancer.  The patients in comparison to placebo immunotherapy showed-improved progression free survival [16.9 vs.5.68m]; and improved median survival [47 vs.29 m].  I would recommend immunotherapy- Durvalumab every 2 weeks.   Discussed the potential side effects of immunotherapy including but not limited to diarrhea; skin rash; elevated LFTs/endocrine abnormalities etc.  # Radiation pneumonitis noted on CT scan grade 1 asymptomatic.  No steroids. STABLE.  # Radiation dermatitis- G-2 ;STABLE.  On silvadene iontment BID.   #History of A. fib on Eliquis. STABLE.   # I reviewed the blood work- with the patient in detail; also reviewed the imaging independently [as summarized above]; and with the patient in detail.  Patient and family had multiple questions about the imaging [also questioning authenticity of the imaging/results].  I did my best to help patient family understand the treatment course and plan.  I even offered a second opinion at a tertiary center; patient they declined.  Patient wants to proceed with treatment plan as detailed above.   # DISPOSITION:  # follow up in 2 weeks-MD; labs- cbc/cmp;IMFINZI [new]- Dr.B  # 40 minutes face-to-face with the patient discussing the above plan of care; more than 50% of time spent on prognosis/ natural history; counseling and  coordination.

## 2020-12-08 ENCOUNTER — Ambulatory Visit
Admission: RE | Admit: 2020-12-08 | Discharge: 2020-12-08 | Disposition: A | Discharge status: Home / Self Care | Payer: Medicare Other | Source: Ambulatory Visit | Attending: Radiation Oncology | Admitting: Radiation Oncology

## 2020-12-08 ENCOUNTER — Encounter: Payer: Self-pay | Admitting: Radiation Oncology

## 2020-12-08 VITALS — BP 146/75 | HR 72 | Temp 96.0°F | Wt 161.5 lb

## 2020-12-08 DIAGNOSIS — Z923 Personal history of irradiation: Secondary | ICD-10-CM | POA: Diagnosis not present

## 2020-12-08 DIAGNOSIS — C3432 Malignant neoplasm of lower lobe, left bronchus or lung: Secondary | ICD-10-CM | POA: Insufficient documentation

## 2020-12-08 NOTE — Progress Notes (Signed)
Radiation Oncology Follow up Note  Name: Jason Warren   Date:   12/08/2020 MRN:  [id number] DOB: 04/16/1929    This 87 y.o. male presents to the clinic today for 1 month follow-up status post concurrent chemoradiation therapy for stage IIIb adenocarcinoma the left lung.  REFERRING PROVIDER: Baxter Hire, MD  HPI: Patient is an 87 year old male now at 1 month having completed concurrent chemoradiation therapy for stage IIIb adenocarcinoma the left lung.  Seen today in routine follow-up he is doing well he had some fairly brisk skin changes on his back which seem to be healing he has 1 area that still bandage which I have asked him to not bandage at this time there is no skin breakdown.  He specifically denies hemoptysis chest tightness or dysphagia.Marland Kitchen  He had a recent CT scan a little premature at the end of February although it did show significant interval decrease in size of the spiculated mass of the superior segment left lower lobe with improvement in left hilar and mediastinal adenopathy consistent with treatment response.  COMPLICATIONS OF TREATMENT: none  FOLLOW UP COMPLIANCE: keeps appointments   PHYSICAL EXAM:  BP (!) 146/75   Pulse 72   Temp (!) 96 F (35.6 C) (Tympanic)   Wt 161 lb 8 oz (73.3 kg)   BMI 21.90 kg/m Area of erythema on the back is markedly improved no skin breakdown or moist desquamation noted Well-developed well-nourished patient in NAD. HEENT reveals PERLA, EOMI, discs not visualized.  Oral cavity is clear. No oral mucosal lesions are identified. Neck is clear without evidence of cervical or supraclavicular adenopathy. Lungs are clear to A&P. Cardiac examination is essentially unremarkable with regular rate and rhythm without murmur rub or thrill. Abdomen is benign with no organomegaly or masses noted. Motor sensory and DTR levels are equal and symmetric in the upper and lower extremities. Cranial nerves II through XII are grossly intact. Proprioception is  intact. No peripheral adenopathy or edema is identified. No motor or sensory levels are noted. Crude visual fields are within normal range.  RADIOLOGY RESULTS: CT scan reviewed compatible with above-stated findings  PLAN: Present time patient is doing well.  He is scheduled to start immunotherapy under Dr. Sharmaine Base direction in the next several weeks.  He is scheduled to startDurvalumab every 2 weeks. .  I have asked to see him back in 3 to 4 months for follow-up.  I have asked him not to bandage his back since that is healing well.  Patient and wife both noted call with any concerns.  I did show them his previous and recent CT scans.  I would like to take this opportunity to thank you for allowing me to participate in the care of your patient.Noreene Filbert, MD

## 2020-12-17 ENCOUNTER — Inpatient Hospital Stay: Payer: Medicare Other

## 2020-12-17 ENCOUNTER — Inpatient Hospital Stay (HOSPITAL_BASED_OUTPATIENT_CLINIC_OR_DEPARTMENT_OTHER): Payer: Medicare Other | Admitting: Internal Medicine

## 2020-12-17 ENCOUNTER — Encounter: Payer: Self-pay | Admitting: Internal Medicine

## 2020-12-17 DIAGNOSIS — C3432 Malignant neoplasm of lower lobe, left bronchus or lung: Secondary | ICD-10-CM | POA: Diagnosis not present

## 2020-12-17 DIAGNOSIS — Z5112 Encounter for antineoplastic immunotherapy: Secondary | ICD-10-CM | POA: Diagnosis not present

## 2020-12-17 LAB — CBC WITH DIFFERENTIAL/PLATELET
Abs Immature Granulocytes: 0.02 10*3/uL (ref 0.00–0.07)
Basophils Absolute: 0 10*3/uL (ref 0.0–0.1)
Basophils Relative: 0 %
Eosinophils Absolute: 0.1 10*3/uL (ref 0.0–0.5)
Eosinophils Relative: 2 %
HCT: 37 % — ABNORMAL LOW (ref 39.0–52.0)
Hemoglobin: 12.3 g/dL — ABNORMAL LOW (ref 13.0–17.0)
Immature Granulocytes: 0 %
Lymphocytes Relative: 16 %
Lymphs Abs: 0.9 10*3/uL (ref 0.7–4.0)
MCH: 33 pg (ref 26.0–34.0)
MCHC: 33.2 g/dL (ref 30.0–36.0)
MCV: 99.2 fL (ref 80.0–100.0)
Monocytes Absolute: 0.6 10*3/uL (ref 0.1–1.0)
Monocytes Relative: 10 %
Neutro Abs: 4 10*3/uL (ref 1.7–7.7)
Neutrophils Relative %: 72 %
Platelets: 252 10*3/uL (ref 150–400)
RBC: 3.73 MIL/uL — ABNORMAL LOW (ref 4.22–5.81)
RDW: 14.6 % (ref 11.5–15.5)
WBC: 5.6 10*3/uL (ref 4.0–10.5)
nRBC: 0 % (ref 0.0–0.2)

## 2020-12-17 LAB — COMPREHENSIVE METABOLIC PANEL
ALT: 15 U/L (ref 0–44)
AST: 26 U/L (ref 15–41)
Albumin: 3.8 g/dL (ref 3.5–5.0)
Alkaline Phosphatase: 128 U/L — ABNORMAL HIGH (ref 38–126)
Anion gap: 11 (ref 5–15)
BUN: 19 mg/dL (ref 8–23)
CO2: 23 mmol/L (ref 22–32)
Calcium: 9 mg/dL (ref 8.9–10.3)
Chloride: 104 mmol/L (ref 98–111)
Creatinine, Ser: 1.04 mg/dL (ref 0.61–1.24)
GFR, Estimated: >60 mL/min (ref >60)
Glucose, Bld: 130 mg/dL — ABNORMAL HIGH (ref 70–99)
Potassium: 4.2 mmol/L (ref 3.5–5.1)
Sodium: 138 mmol/L (ref 135–145)
Total Bilirubin: 1.1 mg/dL (ref 0.3–1.2)
Total Protein: 7.1 g/dL (ref 6.5–8.1)

## 2020-12-17 MED ORDER — SODIUM CHLORIDE 0.9% FLUSH
10.0000 mL | INTRAVENOUS | Status: DC | PRN
  Administered 2020-12-17: 10 mL via INTRAVENOUS
  Filled 2020-12-17: qty 10

## 2020-12-17 MED ORDER — HEPARIN SOD (PORK) LOCK FLUSH 100 UNIT/ML IV SOLN
500.0000 [IU] | Freq: Once | INTRAVENOUS | Status: DC
  Filled 2020-12-17: qty 5

## 2020-12-17 MED ORDER — SODIUM CHLORIDE 0.9% FLUSH
10.0000 mL | INTRAVENOUS | Status: DC | PRN
  Filled 2020-12-17: qty 10

## 2020-12-17 MED ORDER — HEPARIN SOD (PORK) LOCK FLUSH 100 UNIT/ML IV SOLN
INTRAVENOUS | Status: AC
  Filled 2020-12-17: qty 5

## 2020-12-17 MED ORDER — SODIUM CHLORIDE 0.9 % IV SOLN
Freq: Once | INTRAVENOUS | Status: AC
  Filled 2020-12-17: qty 250

## 2020-12-17 MED ORDER — SODIUM CHLORIDE 0.9 % IV SOLN
10.0000 mg/kg | Freq: Once | INTRAVENOUS | Status: AC
  Administered 2020-12-17: 740 mg via INTRAVENOUS
  Filled 2020-12-17: qty 10

## 2020-12-17 MED ORDER — HEPARIN SOD (PORK) LOCK FLUSH 100 UNIT/ML IV SOLN
500.0000 [IU] | Freq: Once | INTRAVENOUS | Status: AC | PRN
  Administered 2020-12-17: 500 [IU]
  Filled 2020-12-17: qty 5

## 2020-12-17 NOTE — Progress Notes (Signed)
[facility] NOTE  Patient Care Team: Baxter Hire, MD as PCP - General (Internal Medicine) [name], RN as Oncology Nurse Navigator [name], MD as Consulting Physician (Hematology and Oncology)  CHIEF COMPLAINTS/PURPOSE OF CONSULTATION: Lung cancer  #  Oncology History Overview Note  # NOV 2021- LEFT LOWER LOBE NON-SMALL CELL CA [favor adeno ca]; T2N3 [right hilar; subcarinal LN;[name]; NOV 2021-MRI Brain-NEG  # 11/30- carbo-Taxol-RT [RT until 10/23/20]   # A.fib [Eliquis; Dr.Kowalski]  # # SURVIVORSHIP:   # GENETICS:   #NGS-ordered  DIAGNOSIS: Lung cancer  STAGE:   III      ;  GOALS:  cure  CURRENT/MOST RECENT THERAPY : carbo-Taxol-RT ]   Cancer of lower lobe of left lung ([place])  08/14/2020 Initial Diagnosis   Cancer of lower lobe of left lung ([place])   09/02/2020 - 10/22/2020 Chemotherapy    Patient is on Treatment Plan: LUNG DURVALUMAB Q14D      10/16/2020 Cancer Staging   Staging form: Lung, AJCC 8th Edition - Clinical: Stage IIIB (cT2a, cN3, cM0) - Signed by [name], MD on 10/16/2020   12/17/2020 -  Chemotherapy    Patient is on Treatment Plan: LUNG DURVALUMAB Q14D         HISTORY OF PRESENTING ILLNESS:  Jason Warren 87 y.o.  male patient with stage III lung non-small cell lung-currently s/p concurrent chemoradiation is here to proceed with adjuvant duvalumab.  Patient denies any worsening cough.  He noticed to have mild difficulty swallowing the last few days.  Denies any pain.  Associated with solids more than liquids.  No regurgitation.  Started using Carafate 2 days ago.  Denies any headaches.  Denies any nausea vomiting.  Review of Systems  Constitutional: Negative for chills, diaphoresis, fever, malaise/fatigue and weight loss.  HENT: Negative for nosebleeds and sore throat.   Eyes: Negative for double vision.  Respiratory: Positive for cough. Negative for hemoptysis, sputum  production, shortness of breath and wheezing.   Cardiovascular: Negative for chest pain, palpitations, orthopnea and leg swelling.  Gastrointestinal: Negative for abdominal pain, blood in stool, constipation, diarrhea, heartburn, melena, nausea and vomiting.  Genitourinary: Negative for dysuria, frequency and urgency.  Musculoskeletal: Negative for back pain and joint pain.  Skin: Positive for rash. Negative for itching.  Neurological: Negative for tingling, focal weakness, weakness and headaches.  Endo/Heme/Allergies: Does not bruise/bleed easily.  Psychiatric/Behavioral: Negative for depression. The patient is not nervous/anxious and does not have insomnia.      MEDICAL HISTORY:  Past Medical History:  Diagnosis Date  . Arthritis   . Dysrhythmia    A-fib  . Hip pain    left  . HOH (hard of hearing)   . LBBB (left bundle branch block) 08/05/2020  . Skin cancer, basal cell     face top of head    SURGICAL HISTORY: Past Surgical History:  Procedure Laterality Date  . CATARACT EXTRACTION W/PHACO Right 07/13/2017   Procedure: CATARACT EXTRACTION PHACO AND INTRAOCULAR LENS PLACEMENT ([place]);  Surgeon: Leandrew Koyanagi, MD;  Location: [facility];  Service: Ophthalmology;  Laterality: Right;  IVA TOPICAL RIGHT  . CATARACT EXTRACTION W/PHACO Left 01/04/2018   Procedure: CATARACT EXTRACTION PHACO AND INTRAOCULAR LENS PLACEMENT ([place]) LEFT;  Surgeon: Leandrew Koyanagi, MD;  Location: [facility];  Service: Ophthalmology;  Laterality: Left;  . COLONOSCOPY    . IR IMAGING GUIDED PORT INSERTION  08/27/2020  . JOINT REPLACEMENT     Left total hip Dr.  Su Hoff 08-04-18  . ROTATOR CUFF REPAIR Right   . SKIN CANCER EXCISION     face  . TOTAL HIP ARTHROPLASTY Left 08/04/2018   Procedure: TOTAL HIP ARTHROPLASTY ANTERIOR APPROACH;  Surgeon: Frederik Pear, MD;  Location: [facility];  Service: Orthopedics;  Laterality: Left;  Marland Kitchen VIDEO BRONCHOSCOPY WITH ENDOBRONCHIAL NAVIGATION N/A  08/07/2020   Procedure: VIDEO BRONCHOSCOPY WITH ENDOBRONCHIAL NAVIGATION;  Surgeon: Ottie Glazier, MD;  Location: [facility];  Service: Thoracic;  Laterality: N/A;  . VIDEO BRONCHOSCOPY WITH ENDOBRONCHIAL ULTRASOUND N/A 08/07/2020   Procedure: VIDEO BRONCHOSCOPY WITH ENDOBRONCHIAL ULTRASOUND;  Surgeon: Ottie Glazier, MD;  Location: [facility];  Service: Thoracic;  Laterality: N/A;    SOCIAL HISTORY: Social History   Socioeconomic History  . Marital status: Married    Spouse name: Not on file  . Number of children: Not on file  . Years of education: Not on file  . Highest education level: Not on file  Occupational History  . Not on file  Tobacco Use  . Smoking status: Former Smoker    Packs/day: 1.00    Years: 4.00    Pack years: 4.00    Types: Cigarettes  . Smokeless tobacco: Never Used  . Tobacco comment: quit early 70's  Vaping Use  . Vaping Use: Never used  Substance and Sexual Activity  . Alcohol use: No  . Drug use: No  . Sexual activity: Not Currently  Other Topics Concern  . Not on file  Social History Narrative   > quit 35 years; smoked for 15 years. Rare alcohol. 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    Social Determinants of Health   Financial Resource Strain: Not on file  Food Insecurity: Not on file  Transportation Needs: Not on file  Physical Activity: Not on file  Stress: Not on file  Social Connections: Not on file  Intimate Partner Violence: Not on file    FAMILY HISTORY: Family History  Problem Relation Age of Onset  . Throat cancer Brother         & lung cancer    ALLERGIES:  has No Known Allergies.  MEDICATIONS:  Current Outpatient Medications  Medication Sig Dispense Refill  . acetaminophen (TYLENOL) 500 MG tablet Take 500 mg by mouth every 8 (eight) hours as needed for moderate pain.    Marland Kitchen apixaban (ELIQUIS) 5 MG TABS tablet Take 5 mg by mouth 2 (two) times daily.    .  hydroxypropyl methylcellulose / hypromellose (ISOPTO TEARS / GONIOVISC) 2.5 % ophthalmic solution Place 1 drop into the left eye daily as needed for dry eyes.    . Multiple Vitamin (MULTIVITAMIN) tablet Take 1 tablet by mouth daily.    Vladimir Faster Glycol-Propyl Glycol (SYSTANE FREE OP) Apply to eye.    . pravastatin (PRAVACHOL) 80 MG tablet Take 80 mg by mouth at bedtime.     . prochlorperazine (COMPAZINE) 10 MG tablet Take 1 tablet (10 mg total) by mouth every 6 (six) hours as needed for nausea or vomiting. 40 tablet 1  . saw palmetto 160 MG capsule Take 160 mg by mouth 2 (two) times daily.    . silver sulfADIAZINE (SILVADENE) 1 % cream Apply 1 application topically 2 (two) times daily. 85 g 2  . sucralfate (CARAFATE) 1 g tablet Take 1 tablet (1 g total) by mouth 4 (four) times daily -  with meals and at bedtime. 90 tablet 1  .  lidocaine-prilocaine (EMLA) cream Apply 1 application topically as needed. Apply small amount to port site at least 1 hour prior to it being accessed, cover with plastic wrap (Patient not taking: No sig reported) 30 g 1  . ondansetron (ZOFRAN) 8 MG tablet One pill every 8 hours as needed for nausea/vomitting. (Patient not taking: No sig reported) 40 tablet 1   No current facility-administered medications for this visit.   PHYSICAL EXAMINATION: ECOG PERFORMANCE STATUS: 1 - Symptomatic but completely ambulatory  Vitals:   12/17/20 0910  BP: 122/60  Pulse: 68  Resp: 16  Temp: (!) 96.4 F (35.8 C)  SpO2: 100%   Filed Weights   12/17/20 0910  Weight: 163 lb (73.9 kg)    Physical Exam Constitutional:      Comments: Walking independently but with a limp because of left hip arthritis.  He is alone.  HENT:     Head: Normocephalic and atraumatic.     Mouth/Throat:     Pharynx: No oropharyngeal exudate.  Eyes:     Pupils: Pupils are equal, round, and reactive to light.  Cardiovascular:     Rate and Rhythm: Normal rate and regular rhythm.  Pulmonary:     Effort:  Pulmonary effort is normal. No respiratory distress.     Breath sounds: Normal breath sounds. No wheezing.  Abdominal:     General: Bowel sounds are normal. There is no distension.     Palpations: Abdomen is soft. There is no mass.     Tenderness: There is no abdominal tenderness. There is no guarding or rebound.  Musculoskeletal:        General: No tenderness. Normal range of motion.     Cervical back: Normal range of motion and neck supple.  Skin:    General: Skin is warm.     Comments: Area of desquamation in the radiation portal on the back. Covered by Band-Aid. No signs of infection.  Neurological:     Mental Status: He is alert and oriented to person, place, and time.  Psychiatric:        Mood and Affect: Affect normal.      LABORATORY DATA:  I have reviewed the data as listed Lab Results  Component Value Date   WBC 5.6 12/17/2020   HGB 12.3 (L) 12/17/2020   HCT 37.0 (L) 12/17/2020   MCV 99.2 12/17/2020   PLT 252 12/17/2020   Recent Labs    11/05/20 1255 12/03/20 0934 12/17/20 0856  NA 136 137 138  K 4.2 4.4 4.2  CL 100 102 104  CO2 24 25 23   GLUCOSE 144* 103* 130*  BUN 17 17 19   CREATININE 1.01 0.93 1.04  CALCIUM 8.5* 8.9 9.0  GFRNONAA >60 >60 >60  PROT 6.7 6.9 7.1  ALBUMIN 3.5 3.6 3.8  AST 30 26 26   ALT 20 17 15   ALKPHOS 117 139* 128*  BILITOT 0.9 0.8 1.1    RADIOGRAPHIC STUDIES: I have personally reviewed the radiological images as listed and agreed with the findings in the report. CT Chest W Contrast  Result Date: 11/27/2020 CLINICAL DATA:  Metastatic non-small cell lung cancer, assess treatment response status post chemo radiation EXAM: CT CHEST WITH CONTRAST TECHNIQUE: Multidetector CT imaging of the chest was performed during intravenous contrast administration. CONTRAST:  58mL OMNIPAQUE IOHEXOL 300 MG/ML  SOLN COMPARISON:  08/04/2020 FINDINGS: Cardiovascular: Aortic atherosclerosis. Normal heart size. Scattered coronary artery calcifications. No  pericardial effusion. Mediastinum/Nodes: Interval resolution of previously seen left hilar lymphadenopathy. Decrease in  size of abnormally enlarged left hilar/left paratracheal lymph node conglomerate, measuring approximately 2.6 x 1.5 cm, previously 3.0 x 1.6 cm (series 2, image 74). Thyroid gland, trachea, and esophagus demonstrate no significant findings. Lungs/Pleura: Significant interval decrease in size of a spiculated mass of the superior segment left lower lobe with new internal cavitation, lesion measuring 2.9 x 2.3 cm, previously 3.7 x 3.3 cm (series 3, image 100). No significant change in size and fullness of a cavitary lesion of the superior segment right lower lobe, measuring 4.0 x 3.2 cm (series 3, image 100). There is new multifocal consolidation and irregular opacity of the left upper lobe, most conspicuously in the paramedian suprahilar left lung (series 3, image 65). There is also minimal similar opacity seen in the paramedian right upper lobe (series 3, image 72). No pleural effusion or pneumothorax. Upper Abdomen: No acute abnormality. Musculoskeletal: No chest wall mass or suspicious bone lesions identified. IMPRESSION: 1. Significant interval decrease in size of a spiculated mass of the superior segment left lower lobe with new internal cavitation. 2. Interval improvement in left hilar and mediastinal lymphadenopathy. 3. Findings are consistent with treatment response. 4. A non FDG PET avid cavitary lesion of the superior segment right lower lobe is unchanged. 5. 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 6. Coronary artery disease. Aortic Atherosclerosis (ICD10-I70.0). Electronically Signed   By: Eddie Candle M.D.   On: 11/27/2020 10:44    ASSESSMENT & PLAN:   Cancer of lower lobe of left lung ([place]) # T2N3- Stage III-non-small cell lung cancer-[favor adenocarcinoma]- s/p  concurrent chemoradiation weekly carbotaxol with the daily radiation.Derrill Memo 11/30; finished Feb  2022]; FEB 2022-  Significant interval decrease in size of a spiculated mass; left hilar and mediastinal lymphadenopathy; non-FDG avid right lower lobe mass stable. Proceed with adjuvant durvalumab.  # Proceed with Durvalumab #1 today. Labs today reviewed;  acceptable for treatment today.   # Dysphagia-to solids? RT induced- recommend esophagogram; patient wants to wait for 2 weeks.   # Radiation pneumonitis noted on CT scan grade 1 asymptomatic.  No steroids. STABLE.  #History of A. fib on Eliquis. STABLE.    # DISPOSITION:  # IMFINZI today # follow up in 2 weeks-MD; labs- cbc/cmp;IMFINZI-Dr.B   All questions were answered. The patient knows to call the clinic with any problems, questions or concerns.   [name], MD 12/17/2020 7:22 PM

## 2020-12-17 NOTE — Assessment & Plan Note (Addendum)
#   T2N3- Stage III-non-small cell lung cancer-[favor adenocarcinoma]- s/p  concurrent chemoradiation weekly carbotaxol with the daily radiation.Derrill Memo 11/30; finished Feb 2022]; FEB 2022-  Significant interval decrease in size of a spiculated mass; left hilar and mediastinal lymphadenopathy; non-FDG avid right lower lobe mass stable. Proceed with adjuvant durvalumab.  # Proceed with Durvalumab #1 today. Labs today reviewed;  acceptable for treatment today.   # Dysphagia-to solids? RT induced- recommend esophagogram; patient wants to wait for 2 weeks.   # Radiation pneumonitis noted on CT scan grade 1 asymptomatic.  No steroids. STABLE.  #History of A. fib on Eliquis. STABLE.    # DISPOSITION:  # IMFINZI today # follow up in 2 weeks-MD; labs- cbc/cmp;IMFINZI-Dr.B

## 2020-12-31 ENCOUNTER — Inpatient Hospital Stay: Payer: Medicare Other

## 2020-12-31 ENCOUNTER — Encounter: Payer: Self-pay | Admitting: Internal Medicine

## 2020-12-31 ENCOUNTER — Other Ambulatory Visit: Payer: Self-pay

## 2020-12-31 ENCOUNTER — Inpatient Hospital Stay (HOSPITAL_BASED_OUTPATIENT_CLINIC_OR_DEPARTMENT_OTHER): Payer: Medicare Other | Admitting: Internal Medicine

## 2020-12-31 VITALS — BP 121/75 | HR 64 | Resp 16

## 2020-12-31 DIAGNOSIS — C3432 Malignant neoplasm of lower lobe, left bronchus or lung: Secondary | ICD-10-CM

## 2020-12-31 DIAGNOSIS — Z5112 Encounter for antineoplastic immunotherapy: Secondary | ICD-10-CM | POA: Diagnosis not present

## 2020-12-31 LAB — COMPREHENSIVE METABOLIC PANEL
ALT: 14 U/L (ref 0–44)
AST: 24 U/L (ref 15–41)
Albumin: 3.4 g/dL — ABNORMAL LOW (ref 3.5–5.0)
Alkaline Phosphatase: 119 U/L (ref 38–126)
Anion gap: 9 (ref 5–15)
BUN: 18 mg/dL (ref 8–23)
CO2: 25 mmol/L (ref 22–32)
Calcium: 9 mg/dL (ref 8.9–10.3)
Chloride: 103 mmol/L (ref 98–111)
Creatinine, Ser: 0.96 mg/dL (ref 0.61–1.24)
GFR, Estimated: >60 mL/min (ref >60)
Glucose, Bld: 137 mg/dL — ABNORMAL HIGH (ref 70–99)
Potassium: 4 mmol/L (ref 3.5–5.1)
Sodium: 137 mmol/L (ref 135–145)
Total Bilirubin: 0.7 mg/dL (ref 0.3–1.2)
Total Protein: 6.9 g/dL (ref 6.5–8.1)

## 2020-12-31 LAB — CBC WITH DIFFERENTIAL/PLATELET
Abs Immature Granulocytes: 0.03 10*3/uL (ref 0.00–0.07)
Basophils Absolute: 0 10*3/uL (ref 0.0–0.1)
Basophils Relative: 0 %
Eosinophils Absolute: 0.1 10*3/uL (ref 0.0–0.5)
Eosinophils Relative: 2 %
HCT: 38.3 % — ABNORMAL LOW (ref 39.0–52.0)
Hemoglobin: 12.7 g/dL — ABNORMAL LOW (ref 13.0–17.0)
Immature Granulocytes: 1 %
Lymphocytes Relative: 16 %
Lymphs Abs: 0.9 10*3/uL (ref 0.7–4.0)
MCH: 32.6 pg (ref 26.0–34.0)
MCHC: 33.2 g/dL (ref 30.0–36.0)
MCV: 98.5 fL (ref 80.0–100.0)
Monocytes Absolute: 0.6 10*3/uL (ref 0.1–1.0)
Monocytes Relative: 11 %
Neutro Abs: 3.9 10*3/uL (ref 1.7–7.7)
Neutrophils Relative %: 70 %
Platelets: 253 10*3/uL (ref 150–400)
RBC: 3.89 MIL/uL — ABNORMAL LOW (ref 4.22–5.81)
RDW: 13.5 % (ref 11.5–15.5)
WBC: 5.6 10*3/uL (ref 4.0–10.5)
nRBC: 0 % (ref 0.0–0.2)

## 2020-12-31 MED ORDER — HEPARIN SOD (PORK) LOCK FLUSH 100 UNIT/ML IV SOLN
INTRAVENOUS | Status: AC
  Filled 2020-12-31: qty 5

## 2020-12-31 MED ORDER — SODIUM CHLORIDE 0.9 % IV SOLN
Freq: Once | INTRAVENOUS | Status: AC
  Filled 2020-12-31: qty 250

## 2020-12-31 MED ORDER — HEPARIN SOD (PORK) LOCK FLUSH 100 UNIT/ML IV SOLN
500.0000 [IU] | Freq: Once | INTRAVENOUS | Status: AC
  Administered 2020-12-31: 500 [IU] via INTRAVENOUS
  Filled 2020-12-31: qty 5

## 2020-12-31 MED ORDER — SODIUM CHLORIDE 0.9% FLUSH
10.0000 mL | Freq: Once | INTRAVENOUS | Status: AC
  Administered 2020-12-31: 10 mL via INTRAVENOUS
  Filled 2020-12-31: qty 10

## 2020-12-31 MED ORDER — SODIUM CHLORIDE 0.9 % IV SOLN
10.0000 mg/kg | Freq: Once | INTRAVENOUS | Status: AC
  Administered 2020-12-31: 740 mg via INTRAVENOUS
  Filled 2020-12-31: qty 10

## 2020-12-31 NOTE — Progress Notes (Signed)
Pt has dry cough at times and then soemtimes some congestion but does not cough it up. Does not happen all the time- just here and there.

## 2020-12-31 NOTE — Assessment & Plan Note (Signed)
#   T2N3- Stage III-non-small cell lung cancer-[favor adenocarcinoma]- s/p  concurrent chemoradiation weekly carbotaxol with the daily radiation.  finished Feb 2022]; FEB 2022-  Significant interval decrease in size of a spiculated mass; left hilar and mediastinal lymphadenopathy; non-FDG avid right lower lobe mass stable.currently on  adjuvant durvalumab.  # Proceed with Durvalumab #2 today. Labs today reviewed;  acceptable for treatment today.   # Dysphagia-to solids? RT induced-STABLE;  recommend esophagogram; patient wants to wait for now prior to any other work up.   # Radiation pneumonitis noted on CT scan grade 1 asymptomatic [cough-dry; NO dyspnea- declines anti-tussive; ambulating- pul ox 96%; ].STABLE.   #History of A. fib on Eliquis. STABLE.    # DISPOSITION:  # IMFINZI today # follow up in 2 weeks-MD; labs- cbc/cmp;IMFINZI-Dr.B

## 2020-12-31 NOTE — Progress Notes (Signed)
Took patient on a 2 min walk. O2 dropped to 96% with HR of 85.

## 2020-12-31 NOTE — Progress Notes (Signed)
[place] NOTE  Patient Care Team: Baxter Hire, MD as PCP - General (Internal Medicine) [name], RN as Oncology Nurse Navigator [name], MD as Consulting Physician (Hematology and Oncology)  CHIEF COMPLAINTS/PURPOSE OF CONSULTATION: Lung cancer  #  Oncology History Overview Note  # NOV 2021- LEFT LOWER LOBE NON-SMALL CELL CA [favor adeno ca]; T2N3 [right hilar; subcarinal LN;[name]; NOV 2021-MRI Brain-NEG  # 11/30- carbo-Taxol-RT [RT until 10/23/20]   # A.fib [Eliquis; Dr.Kowalski]  # # SURVIVORSHIP:   # GENETICS:   #NGS-ordered  DIAGNOSIS: Lung cancer  STAGE:   III      ;  GOALS:  cure  CURRENT/MOST RECENT THERAPY : carbo-Taxol-RT ]   Cancer of lower lobe of left lung ([place])  08/14/2020 Initial Diagnosis   Cancer of lower lobe of left lung ([place])   09/02/2020 - 10/22/2020 Chemotherapy    Patient is on Treatment Plan: LUNG DURVALUMAB Q14D      10/16/2020 Cancer Staging   Staging form: Lung, AJCC 8th Edition - Clinical: Stage IIIB (cT2a, cN3, cM0) - Signed by [name], MD on 10/16/2020   12/17/2020 -  Chemotherapy    Patient is on Treatment Plan: LUNG DURVALUMAB Q14D         HISTORY OF PRESENTING ILLNESS:  Jason Warren 87 y.o.  male patient with stage III lung non-small cell lung-currently s/p concurrent chemoradiation is here to proceed with adjuvant duvalumab.  Patient complains of mild cough intermittent.  Dry.  Going on for the last many weeks.  Denies any shortness of breath on exertion.  Continues to have mild difficulty swallowing; more to solids and liquids.  No regurgitation.  Using Carafate.  Appetite is good.  No weight loss.  Review of Systems  Constitutional: Negative for chills, diaphoresis, fever, malaise/fatigue and weight loss.  HENT: Negative for nosebleeds and sore throat.   Eyes: Negative for double vision.  Respiratory: Positive for cough. Negative for hemoptysis, sputum  production, shortness of breath and wheezing.   Cardiovascular: Negative for chest pain, palpitations, orthopnea and leg swelling.  Gastrointestinal: Negative for abdominal pain, blood in stool, constipation, diarrhea, heartburn, melena, nausea and vomiting.  Genitourinary: Negative for dysuria, frequency and urgency.  Musculoskeletal: Negative for back pain and joint pain.  Skin: Negative for itching.  Neurological: Negative for tingling, focal weakness, weakness and headaches.  Endo/Heme/Allergies: Does not bruise/bleed easily.  Psychiatric/Behavioral: Negative for depression. The patient is not nervous/anxious and does not have insomnia.      MEDICAL HISTORY:  Past Medical History:  Diagnosis Date  . Arthritis   . Dysrhythmia    A-fib  . Hip pain    left  . HOH (hard of hearing)   . LBBB (left bundle branch block) 08/05/2020  . Lung cancer ([place])   . Skin cancer, basal cell     face top of head    SURGICAL HISTORY: Past Surgical History:  Procedure Laterality Date  . CATARACT EXTRACTION W/PHACO Right 07/13/2017   Procedure: CATARACT EXTRACTION PHACO AND INTRAOCULAR LENS PLACEMENT ([place]);  Surgeon: Leandrew Koyanagi, MD;  Location: [facility];  Service: Ophthalmology;  Laterality: Right;  IVA TOPICAL RIGHT  . CATARACT EXTRACTION W/PHACO Left 01/04/2018   Procedure: CATARACT EXTRACTION PHACO AND INTRAOCULAR LENS PLACEMENT ([place]) LEFT;  Surgeon: Leandrew Koyanagi, MD;  Location: [facility];  Service: Ophthalmology;  Laterality: Left;  . COLONOSCOPY    . IR IMAGING GUIDED PORT INSERTION  08/27/2020  . JOINT REPLACEMENT  Left total hip Dr. Su Hoff 08-04-18  . ROTATOR CUFF REPAIR Right   . SKIN CANCER EXCISION     face  . TOTAL HIP ARTHROPLASTY Left 08/04/2018   Procedure: TOTAL HIP ARTHROPLASTY ANTERIOR APPROACH;  Surgeon: Frederik Pear, MD;  [facility];  Service: Orthopedics;  Laterality: Left;  Marland Kitchen VIDEO BRONCHOSCOPY WITH ENDOBRONCHIAL NAVIGATION  N/A 08/07/2020   Procedure: VIDEO BRONCHOSCOPY WITH ENDOBRONCHIAL NAVIGATION;  Surgeon: Ottie Glazier, MD;  Location: [facility];  Service: Thoracic;  Laterality: N/A;  . VIDEO BRONCHOSCOPY WITH ENDOBRONCHIAL ULTRASOUND N/A 08/07/2020   Procedure: VIDEO BRONCHOSCOPY WITH ENDOBRONCHIAL ULTRASOUND;  Surgeon: Ottie Glazier, MD;  Location: [facility];  Service: Thoracic;  Laterality: N/A;    SOCIAL HISTORY: Social History   Socioeconomic History  . Marital status: Married    Spouse name: Not on file  . Number of children: Not on file  . Years of education: Not on file  . Highest education level: Not on file  Occupational History  . Not on file  Tobacco Use  . Smoking status: Former Smoker    Packs/day: 1.00    Years: 4.00    Pack years: 4.00    Types: Cigarettes  . Smokeless tobacco: Never Used  . Tobacco comment: quit early 70's  Vaping Use  . Vaping Use: Never used  Substance and Sexual Activity  . Alcohol use: No  . Drug use: No  . Sexual activity: Not Currently  Other Topics Concern  . Not on file  Social History Narrative   > quit 35 years; smoked for 15 years. Rare alcohol. 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    Social Determinants of Health   Financial Resource Strain: Not on file  Food Insecurity: Not on file  Transportation Needs: Not on file  Physical Activity: Not on file  Stress: Not on file  Social Connections: Not on file  Intimate Partner Violence: Not on file    FAMILY HISTORY: Family History  Problem Relation Age of Onset  . Throat cancer Brother         & lung cancer    ALLERGIES:  has No Known Allergies.  MEDICATIONS:  Current Outpatient Medications  Medication Sig Dispense Refill  . acetaminophen (TYLENOL) 500 MG tablet Take 500 mg by mouth every 8 (eight) hours as needed for moderate pain.    Marland Kitchen apixaban (ELIQUIS) 5 MG TABS tablet Take 5 mg by mouth 2 (two) times daily.    .  hydroxypropyl methylcellulose / hypromellose (ISOPTO TEARS / GONIOVISC) 2.5 % ophthalmic solution Place 1 drop into the left eye daily as needed for dry eyes.    . Multiple Vitamin (MULTIVITAMIN) tablet Take 1 tablet by mouth daily.    Vladimir Faster Glycol-Propyl Glycol (SYSTANE FREE OP) Apply 1 drop to eye as needed.    . pravastatin (PRAVACHOL) 80 MG tablet Take 80 mg by mouth at bedtime.     . saw palmetto 160 MG capsule Take 160 mg by mouth 2 (two) times daily.    Marland Kitchen lidocaine-prilocaine (EMLA) cream Apply 1 application topically as needed. Apply small amount to port site at least 1 hour prior to it being accessed, cover with plastic wrap (Patient not taking: No sig reported) 30 g 1  . ondansetron (ZOFRAN) 8 MG tablet One pill every 8 hours as needed for nausea/vomitting. (Patient not taking: No sig reported) 40 tablet 1  . prochlorperazine (COMPAZINE) 10 MG  tablet Take 1 tablet (10 mg total) by mouth every 6 (six) hours as needed for nausea or vomiting. (Patient not taking: Reported on 12/31/2020) 40 tablet 1  . sucralfate (CARAFATE) 1 g tablet Take 1 tablet (1 g total) by mouth 4 (four) times daily -  with meals and at bedtime. (Patient not taking: Reported on 12/31/2020) 90 tablet 1   No current facility-administered medications for this visit.   Facility-Administered Medications Ordered in Other Visits  Medication Dose Route Frequency Provider Last Rate Last Admin  . heparin lock flush 100 unit/mL  500 Units Intravenous Once [name], MD       PHYSICAL EXAMINATION: ECOG PERFORMANCE STATUS: 1 - Symptomatic but completely ambulatory  Vitals:   12/31/20 0926  BP: 112/70  Pulse: (!) 56  Resp: 16  Temp: (!) 97.2 F (36.2 C)   Filed Weights   12/31/20 0926  Weight: 165 lb (74.8 kg)    Physical Exam Constitutional:      Comments: Walking independently but with a limp because of left hip arthritis.  He is alone.  HENT:     Head: Normocephalic and atraumatic.      Mouth/Throat:     Pharynx: No oropharyngeal exudate.  Eyes:     Pupils: Pupils are equal, round, and reactive to light.  Cardiovascular:     Rate and Rhythm: Normal rate and regular rhythm.  Pulmonary:     Effort: Pulmonary effort is normal. No respiratory distress.     Breath sounds: Normal breath sounds. No wheezing.  Abdominal:     General: Bowel sounds are normal. There is no distension.     Palpations: Abdomen is soft. There is no mass.     Tenderness: There is no abdominal tenderness. There is no guarding or rebound.  Musculoskeletal:        General: No tenderness. Normal range of motion.     Cervical back: Normal range of motion and neck supple.  Skin:    General: Skin is warm.  Neurological:     Mental Status: He is alert and oriented to person, place, and time.  Psychiatric:        Mood and Affect: Affect normal.      LABORATORY DATA:  I have reviewed the data as listed Lab Results  Component Value Date   WBC 5.6 12/31/2020   HGB 12.7 (L) 12/31/2020   HCT 38.3 (L) 12/31/2020   MCV 98.5 12/31/2020   PLT 253 12/31/2020   Recent Labs    12/03/20 0934 12/17/20 0856 12/31/20 0835  NA 137 138 137  K 4.4 4.2 4.0  CL 102 104 103  CO2 25 23 25   GLUCOSE 103* 130* 137*  BUN 17 19 18   CREATININE 0.93 1.04 0.96  CALCIUM 8.9 9.0 9.0  GFRNONAA >60 >60 >60  PROT 6.9 7.1 6.9  ALBUMIN 3.6 3.8 3.4*  AST 26 26 24   ALT 17 15 14   ALKPHOS 139* 128* 119  BILITOT 0.8 1.1 0.7    RADIOGRAPHIC STUDIES: I have personally reviewed the radiological images as listed and agreed with the findings in the report. No results found.  ASSESSMENT & PLAN:   Cancer of lower lobe of left lung ([place]) # T2N3- Stage III-non-small cell lung cancer-[favor adenocarcinoma]- s/p  concurrent chemoradiation weekly carbotaxol with the daily radiation.  finished Feb 2022]; FEB 2022-  Significant interval decrease in size of a spiculated mass; left hilar and mediastinal lymphadenopathy; non-FDG avid  right lower lobe mass stable.currently  on  adjuvant durvalumab.  # Proceed with Durvalumab #2 today. Labs today reviewed;  acceptable for treatment today.   # Dysphagia-to solids? RT induced-STABLE;  recommend esophagogram; patient wants to wait for now prior to any other work up.   # Radiation pneumonitis noted on CT scan grade 1 asymptomatic [cough-dry; NO dyspnea- declines anti-tussive; ambulating- pul ox 96%; ].STABLE.   #History of A. fib on Eliquis. STABLE.    # DISPOSITION:  # IMFINZI today # follow up in 2 weeks-MD; labs- cbc/cmp;IMFINZI-Dr.B   All questions were answered. The patient knows to call the clinic with any problems, questions or concerns.   [name], MD 12/31/2020 9:49 AM

## 2021-01-14 ENCOUNTER — Inpatient Hospital Stay (HOSPITAL_BASED_OUTPATIENT_CLINIC_OR_DEPARTMENT_OTHER): Payer: Medicare Other | Admitting: Internal Medicine

## 2021-01-14 ENCOUNTER — Inpatient Hospital Stay: Payer: Medicare Other

## 2021-01-14 ENCOUNTER — Inpatient Hospital Stay: Payer: Medicare Other | Attending: Internal Medicine

## 2021-01-14 ENCOUNTER — Encounter: Payer: Self-pay | Admitting: Internal Medicine

## 2021-01-14 ENCOUNTER — Other Ambulatory Visit: Payer: Self-pay

## 2021-01-14 DIAGNOSIS — M1990 Unspecified osteoarthritis, unspecified site: Secondary | ICD-10-CM | POA: Diagnosis not present

## 2021-01-14 DIAGNOSIS — Z7901 Long term (current) use of anticoagulants: Secondary | ICD-10-CM | POA: Insufficient documentation

## 2021-01-14 DIAGNOSIS — C3432 Malignant neoplasm of lower lobe, left bronchus or lung: Secondary | ICD-10-CM | POA: Diagnosis present

## 2021-01-14 DIAGNOSIS — Z5112 Encounter for antineoplastic immunotherapy: Secondary | ICD-10-CM | POA: Diagnosis not present

## 2021-01-14 DIAGNOSIS — I4891 Unspecified atrial fibrillation: Secondary | ICD-10-CM | POA: Insufficient documentation

## 2021-01-14 DIAGNOSIS — J700 Acute pulmonary manifestations due to radiation: Secondary | ICD-10-CM | POA: Diagnosis not present

## 2021-01-14 DIAGNOSIS — R1310 Dysphagia, unspecified: Secondary | ICD-10-CM | POA: Insufficient documentation

## 2021-01-14 LAB — CBC WITH DIFFERENTIAL/PLATELET
Abs Immature Granulocytes: 0.01 10*3/uL (ref 0.00–0.07)
Basophils Absolute: 0 10*3/uL (ref 0.0–0.1)
Basophils Relative: 0 %
Eosinophils Absolute: 0.2 10*3/uL (ref 0.0–0.5)
Eosinophils Relative: 3 %
HCT: 39 % (ref 39.0–52.0)
Hemoglobin: 13 g/dL (ref 13.0–17.0)
Immature Granulocytes: 0 %
Lymphocytes Relative: 13 %
Lymphs Abs: 0.8 10*3/uL (ref 0.7–4.0)
MCH: 32.4 pg (ref 26.0–34.0)
MCHC: 33.3 g/dL (ref 30.0–36.0)
MCV: 97.3 fL (ref 80.0–100.0)
Monocytes Absolute: 0.5 10*3/uL (ref 0.1–1.0)
Monocytes Relative: 8 %
Neutro Abs: 4.4 10*3/uL (ref 1.7–7.7)
Neutrophils Relative %: 76 %
Platelets: 262 10*3/uL (ref 150–400)
RBC: 4.01 MIL/uL — ABNORMAL LOW (ref 4.22–5.81)
RDW: 13.3 % (ref 11.5–15.5)
WBC: 5.8 10*3/uL (ref 4.0–10.5)
nRBC: 0 % (ref 0.0–0.2)

## 2021-01-14 LAB — COMPREHENSIVE METABOLIC PANEL
ALT: 16 U/L (ref 0–44)
AST: 26 U/L (ref 15–41)
Albumin: 4 g/dL (ref 3.5–5.0)
Alkaline Phosphatase: 122 U/L (ref 38–126)
Anion gap: 9 (ref 5–15)
BUN: 19 mg/dL (ref 8–23)
CO2: 24 mmol/L (ref 22–32)
Calcium: 8.7 mg/dL — ABNORMAL LOW (ref 8.9–10.3)
Chloride: 104 mmol/L (ref 98–111)
Creatinine, Ser: 1.07 mg/dL (ref 0.61–1.24)
GFR, Estimated: >60 mL/min (ref >60)
Glucose, Bld: 175 mg/dL — ABNORMAL HIGH (ref 70–99)
Potassium: 3.9 mmol/L (ref 3.5–5.1)
Sodium: 137 mmol/L (ref 135–145)
Total Bilirubin: 0.9 mg/dL (ref 0.3–1.2)
Total Protein: 7.1 g/dL (ref 6.5–8.1)

## 2021-01-14 MED ORDER — SODIUM CHLORIDE 0.9 % IV SOLN
10.0000 mg/kg | Freq: Once | INTRAVENOUS | Status: AC
  Administered 2021-01-14: 740 mg via INTRAVENOUS
  Filled 2021-01-14: qty 10

## 2021-01-14 MED ORDER — HEPARIN SOD (PORK) LOCK FLUSH 100 UNIT/ML IV SOLN
INTRAVENOUS | Status: AC
  Filled 2021-01-14: qty 5

## 2021-01-14 MED ORDER — HEPARIN SOD (PORK) LOCK FLUSH 100 UNIT/ML IV SOLN
500.0000 [IU] | Freq: Once | INTRAVENOUS | Status: AC | PRN
  Administered 2021-01-14: 500 [IU]
  Filled 2021-01-14: qty 5

## 2021-01-14 MED ORDER — SODIUM CHLORIDE 0.9 % IV SOLN
Freq: Once | INTRAVENOUS | Status: AC
  Filled 2021-01-14: qty 250

## 2021-01-14 NOTE — Progress Notes (Signed)
[place] NOTE  Patient Care Team: Baxter Hire, MD as PCP - General (Internal Medicine) [name], RN as Oncology Nurse Navigator [name], MD as Consulting Physician (Hematology and Oncology)  CHIEF COMPLAINTS/PURPOSE OF CONSULTATION: Lung cancer  #  Oncology History Overview Note  # NOV 2021- LEFT LOWER LOBE NON-SMALL CELL CA [favor adeno ca]; T2N3 [right hilar; subcarinal LN;[name]; NOV 2021-MRI Brain-NEG  # 11/30- carbo-Taxol-RT [RT until 10/23/20]   # A.fib [Eliquis; Dr.Kowalski]  # # SURVIVORSHIP:   # GENETICS:   #NGS-ordered  DIAGNOSIS: Lung cancer  STAGE:   III      ;  GOALS:  cure  CURRENT/MOST RECENT THERAPY : carbo-Taxol-RT ]   Cancer of lower lobe of left lung ([place])  08/14/2020 Initial Diagnosis   Cancer of lower lobe of left lung ([place])   09/02/2020 - 10/22/2020 Chemotherapy    Patient is on Treatment Plan: LUNG DURVALUMAB Q14D      10/16/2020 Cancer Staging   Staging form: Lung, AJCC 8th Edition - Clinical: Stage IIIB (cT2a, cN3, cM0) - Signed by [name], MD on 10/16/2020   12/17/2020 -  Chemotherapy    Patient is on Treatment Plan: LUNG DURVALUMAB Q14D         HISTORY OF PRESENTING ILLNESS:  Jason Warren 87 y.o.  male patient with stage III lung non-small cell lung-currently s/p concurrent chemoradiation is here to proceed with adjuvant duvalumab.  Patient complains of mild joint pains.  Also complains of mild pain in the jaw while chewing.  Otherwise denies any significant symptoms.  He takes Tylenol as needed.  This has not stopped his daily activities.  Mild fatigue.  Continues to have intermittent difficulty swallowing overall improving.  No weight loss.  Review of Systems  Constitutional: Negative for chills, diaphoresis, fever, malaise/fatigue and weight loss.  HENT: Negative for nosebleeds and sore throat.   Eyes: Negative for double vision.  Respiratory: Positive for  cough. Negative for hemoptysis, sputum production, shortness of breath and wheezing.   Cardiovascular: Negative for chest pain, palpitations, orthopnea and leg swelling.  Gastrointestinal: Negative for abdominal pain, blood in stool, constipation, diarrhea, heartburn, melena, nausea and vomiting.  Genitourinary: Negative for dysuria, frequency and urgency.  Musculoskeletal: Negative for back pain and joint pain.  Skin: Negative for itching.  Neurological: Negative for tingling, focal weakness, weakness and headaches.  Endo/Heme/Allergies: Does not bruise/bleed easily.  Psychiatric/Behavioral: Negative for depression. The patient is not nervous/anxious and does not have insomnia.      MEDICAL HISTORY:  Past Medical History:  Diagnosis Date  . Arthritis   . Dysrhythmia    A-fib  . Hip pain    left  . HOH (hard of hearing)   . LBBB (left bundle branch block) 08/05/2020  . Lung cancer ([place])   . Skin cancer, basal cell     face top of head    SURGICAL HISTORY: Past Surgical History:  Procedure Laterality Date  . CATARACT EXTRACTION W/PHACO Right 07/13/2017   Procedure: CATARACT EXTRACTION PHACO AND INTRAOCULAR LENS PLACEMENT ([place]);  Surgeon: Leandrew Koyanagi, MD;  Location: [facility];  Service: Ophthalmology;  Laterality: Right;  IVA TOPICAL RIGHT  . CATARACT EXTRACTION W/PHACO Left 01/04/2018   Procedure: CATARACT EXTRACTION PHACO AND INTRAOCULAR LENS PLACEMENT ([place]) LEFT;  Surgeon: Leandrew Koyanagi, MD;  Location: [facility];  Service: Ophthalmology;  Laterality: Left;  . COLONOSCOPY    . IR IMAGING GUIDED PORT INSERTION  08/27/2020  . JOINT  REPLACEMENT     Left total hip Dr. Su Hoff 08-04-18  . ROTATOR CUFF REPAIR Right   . SKIN CANCER EXCISION     face  . TOTAL HIP ARTHROPLASTY Left 08/04/2018   Procedure: TOTAL HIP ARTHROPLASTY ANTERIOR APPROACH;  Surgeon: Frederik Pear, MD;  [facility];  Service: Orthopedics;  Laterality: Left;  Marland Kitchen VIDEO  BRONCHOSCOPY WITH ENDOBRONCHIAL NAVIGATION N/A 08/07/2020   Procedure: VIDEO BRONCHOSCOPY WITH ENDOBRONCHIAL NAVIGATION;  Surgeon: Ottie Glazier, MD;  Location: [facility];  Service: Thoracic;  Laterality: N/A;  . VIDEO BRONCHOSCOPY WITH ENDOBRONCHIAL ULTRASOUND N/A 08/07/2020   Procedure: VIDEO BRONCHOSCOPY WITH ENDOBRONCHIAL ULTRASOUND;  Surgeon: Ottie Glazier, MD;  Location: [facility];  Service: Thoracic;  Laterality: N/A;    SOCIAL HISTORY: Social History   Socioeconomic History  . Marital status: Married    Spouse name: Not on file  . Number of children: Not on file  . Years of education: Not on file  . Highest education level: Not on file  Occupational History  . Not on file  Tobacco Use  . Smoking status: Former Smoker    Packs/day: 1.00    Years: 4.00    Pack years: 4.00    Types: Cigarettes  . Smokeless tobacco: Never Used  . Tobacco comment: quit early 70's  Vaping Use  . Vaping Use: Never used  Substance and Sexual Activity  . Alcohol use: No  . Drug use: No  . Sexual activity: Not Currently  Other Topics Concern  . Not on file  Social History Narrative   > quit 35 years; smoked for 15 years. Rare alcohol. 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    Social Determinants of Health   Financial Resource Strain: Not on file  Food Insecurity: Not on file  Transportation Needs: Not on file  Physical Activity: Not on file  Stress: Not on file  Social Connections: Not on file  Intimate Partner Violence: Not on file    FAMILY HISTORY: Family History  Problem Relation Age of Onset  . Throat cancer Brother         & lung cancer    ALLERGIES:  has No Known Allergies.  MEDICATIONS:  Current Outpatient Medications  Medication Sig Dispense Refill  . acetaminophen (TYLENOL) 500 MG tablet Take 500 mg by mouth every 8 (eight) hours as needed for moderate pain.    Marland Kitchen apixaban (ELIQUIS) 5 MG TABS tablet  Take 5 mg by mouth 2 (two) times daily.    . hydroxypropyl methylcellulose / hypromellose (ISOPTO TEARS / GONIOVISC) 2.5 % ophthalmic solution Place 1 drop into the left eye daily as needed for dry eyes.    . Multiple Vitamin (MULTIVITAMIN) tablet Take 1 tablet by mouth daily.    Vladimir Faster Glycol-Propyl Glycol (SYSTANE FREE OP) Apply 1 drop to eye as needed.    . pravastatin (PRAVACHOL) 80 MG tablet Take 80 mg by mouth at bedtime.     . saw palmetto 160 MG capsule Take 160 mg by mouth 2 (two) times daily.    Marland Kitchen lidocaine-prilocaine (EMLA) cream Apply 1 application topically as needed. Apply small amount to port site at least 1 hour prior to it being accessed, cover with plastic wrap (Patient not taking: No sig reported) 30 g 1  . ondansetron (ZOFRAN) 8 MG tablet One pill every 8 hours as needed for nausea/vomitting. (Patient not taking: No sig reported) 40 tablet 1  .  prochlorperazine (COMPAZINE) 10 MG tablet Take 1 tablet (10 mg total) by mouth every 6 (six) hours as needed for nausea or vomiting. (Patient not taking: No sig reported) 40 tablet 1  . sucralfate (CARAFATE) 1 g tablet Take 1 tablet (1 g total) by mouth 4 (four) times daily -  with meals and at bedtime. (Patient not taking: No sig reported) 90 tablet 1   No current facility-administered medications for this visit.   PHYSICAL EXAMINATION: ECOG PERFORMANCE STATUS: 1 - Symptomatic but completely ambulatory  Vitals:   01/14/21 0911  BP: 117/66  Pulse: 68  Resp: 18  Temp: (!) 96.3 F (35.7 C)  SpO2: 99%   Filed Weights   01/14/21 0911  Weight: 164 lb (74.4 kg)    Physical Exam Constitutional:      Comments: Walking independently but with a limp because of left hip arthritis.  He is alone.  HENT:     Head: Normocephalic and atraumatic.     Mouth/Throat:     Pharynx: No oropharyngeal exudate.  Eyes:     Pupils: Pupils are equal, round, and reactive to light.  Cardiovascular:     Rate and Rhythm: Normal rate and regular  rhythm.  Pulmonary:     Effort: Pulmonary effort is normal. No respiratory distress.     Breath sounds: Normal breath sounds. No wheezing.  Abdominal:     General: Bowel sounds are normal. There is no distension.     Palpations: Abdomen is soft. There is no mass.     Tenderness: There is no abdominal tenderness. There is no guarding or rebound.  Musculoskeletal:        General: No tenderness. Normal range of motion.     Cervical back: Normal range of motion and neck supple.  Skin:    General: Skin is warm.  Neurological:     Mental Status: He is alert and oriented to person, place, and time.  Psychiatric:        Mood and Affect: Affect normal.      LABORATORY DATA:  I have reviewed the data as listed Lab Results  Component Value Date   WBC 5.8 01/14/2021   HGB 13.0 01/14/2021   HCT 39.0 01/14/2021   MCV 97.3 01/14/2021   PLT 262 01/14/2021   Recent Labs    12/17/20 0856 12/31/20 0835 01/14/21 0830  NA 138 137 137  K 4.2 4.0 3.9  CL 104 103 104  CO2 23 25 24   GLUCOSE 130* 137* 175*  BUN 19 18 19   CREATININE 1.04 0.96 1.07  CALCIUM 9.0 9.0 8.7*  GFRNONAA >60 >60 >60  PROT 7.1 6.9 7.1  ALBUMIN 3.8 3.4* 4.0  AST 26 24 26   ALT 15 14 16   ALKPHOS 128* 119 122  BILITOT 1.1 0.7 0.9    RADIOGRAPHIC STUDIES: I have personally reviewed the radiological images as listed and agreed with the findings in the report. No results found.  ASSESSMENT & PLAN:   Cancer of lower lobe of left lung ([place]) # T2N3- Stage III-non-small cell lung cancer-[favor adenocarcinoma]- s/p  concurrent chemoradiation weekly carbotaxol with the daily radiation.  finished Feb 2022]; FEB 2022-  Significant interval decrease in size of a spiculated mass; left hilar and mediastinal lymphadenopathy; non-FDG avid right lower lobe mass stable.currently on  adjuvant durvalumab.  # Proceed with Durvalumab #3 today. Labs today reviewed;  acceptable for treatment today.   # Dysphagia-to solids? RT  induced- STABLE;  recommend esophagogram; declines .   #  arthralgias-grade 1? duvalumab-monitor for now.  # Radiation pneumonitis noted on CT scan grade 1 asymptomatic [cough-dry; NO dyspnea- declines anti-tussive; ambulating- pul ox 96%; ].  Stable.   #History of A. fib on Eliquis. Stable.     # DISPOSITION:  # IMFINZI today # follow up in 2 weeks-MD; labs- cbc/cmp;IMFINZI-Dr.B   All questions were answered. The patient knows to call the clinic with any problems, questions or concerns.   [name], MD 01/14/2021 1:37 PM

## 2021-01-14 NOTE — Assessment & Plan Note (Addendum)
#   T2N3- Stage III-non-small cell lung cancer-[favor adenocarcinoma]- s/p  concurrent chemoradiation weekly carbotaxol with the daily radiation.  finished Feb 2022]; FEB 2022-  Significant interval decrease in size of a spiculated mass; left hilar and mediastinal lymphadenopathy; non-FDG avid right lower lobe mass stable.currently on  adjuvant durvalumab.  # Proceed with Durvalumab #3 today. Labs today reviewed;  acceptable for treatment today.   # Dysphagia-to solids? RT induced- STABLE;  recommend esophagogram; declines .   # arthralgias-grade 1? duvalumab-monitor for now.  # Radiation pneumonitis noted on CT scan grade 1 asymptomatic [cough-dry; NO dyspnea- declines anti-tussive; ambulating- pul ox 96%; ].  Stable.   #History of A. fib on Eliquis. Stable.     # DISPOSITION:  # IMFINZI today # follow up in 2 weeks-MD; labs- cbc/cmp;IMFINZI-Dr.B

## 2021-01-14 NOTE — Progress Notes (Signed)
Pt in for follow up, denies any concerns or difficulties today. 

## 2021-01-28 ENCOUNTER — Inpatient Hospital Stay: Payer: Medicare Other

## 2021-01-28 ENCOUNTER — Other Ambulatory Visit: Payer: Self-pay

## 2021-01-28 ENCOUNTER — Inpatient Hospital Stay (HOSPITAL_BASED_OUTPATIENT_CLINIC_OR_DEPARTMENT_OTHER): Payer: Medicare Other | Admitting: Internal Medicine

## 2021-01-28 ENCOUNTER — Encounter: Payer: Self-pay | Admitting: Internal Medicine

## 2021-01-28 VITALS — BP 115/71 | HR 71 | Temp 97.2°F | Resp 16 | Ht 72.0 in | Wt 165.0 lb

## 2021-01-28 DIAGNOSIS — C3432 Malignant neoplasm of lower lobe, left bronchus or lung: Secondary | ICD-10-CM

## 2021-01-28 DIAGNOSIS — Z5112 Encounter for antineoplastic immunotherapy: Secondary | ICD-10-CM | POA: Diagnosis not present

## 2021-01-28 DIAGNOSIS — T732XXD Exhaustion due to exposure, subsequent encounter: Secondary | ICD-10-CM

## 2021-01-28 DIAGNOSIS — Z9189 Other specified personal risk factors, not elsewhere classified: Secondary | ICD-10-CM | POA: Diagnosis not present

## 2021-01-28 DIAGNOSIS — R5383 Other fatigue: Secondary | ICD-10-CM

## 2021-01-28 LAB — COMPREHENSIVE METABOLIC PANEL
ALT: 16 U/L (ref 0–44)
AST: 26 U/L (ref 15–41)
Albumin: 3.7 g/dL (ref 3.5–5.0)
Alkaline Phosphatase: 120 U/L (ref 38–126)
Anion gap: 10 (ref 5–15)
BUN: 21 mg/dL (ref 8–23)
CO2: 24 mmol/L (ref 22–32)
Calcium: 8.9 mg/dL (ref 8.9–10.3)
Chloride: 103 mmol/L (ref 98–111)
Creatinine, Ser: 0.99 mg/dL (ref 0.61–1.24)
GFR, Estimated: >60 mL/min (ref >60)
Glucose, Bld: 140 mg/dL — ABNORMAL HIGH (ref 70–99)
Potassium: 4 mmol/L (ref 3.5–5.1)
Sodium: 137 mmol/L (ref 135–145)
Total Bilirubin: 0.7 mg/dL (ref 0.3–1.2)
Total Protein: 7.1 g/dL (ref 6.5–8.1)

## 2021-01-28 LAB — CBC WITH DIFFERENTIAL/PLATELET
Abs Immature Granulocytes: 0.03 10*3/uL (ref 0.00–0.07)
Basophils Absolute: 0 10*3/uL (ref 0.0–0.1)
Basophils Relative: 1 %
Eosinophils Absolute: 0.3 10*3/uL (ref 0.0–0.5)
Eosinophils Relative: 4 %
HCT: 40.6 % (ref 39.0–52.0)
Hemoglobin: 13.4 g/dL (ref 13.0–17.0)
Immature Granulocytes: 1 %
Lymphocytes Relative: 15 %
Lymphs Abs: 0.9 10*3/uL (ref 0.7–4.0)
MCH: 31.8 pg (ref 26.0–34.0)
MCHC: 33 g/dL (ref 30.0–36.0)
MCV: 96.4 fL (ref 80.0–100.0)
Monocytes Absolute: 0.6 10*3/uL (ref 0.1–1.0)
Monocytes Relative: 10 %
Neutro Abs: 4.5 10*3/uL (ref 1.7–7.7)
Neutrophils Relative %: 69 %
Platelets: 247 10*3/uL (ref 150–400)
RBC: 4.21 MIL/uL — ABNORMAL LOW (ref 4.22–5.81)
RDW: 13.3 % (ref 11.5–15.5)
WBC: 6.3 10*3/uL (ref 4.0–10.5)
nRBC: 0 % (ref 0.0–0.2)

## 2021-01-28 MED ORDER — HEPARIN SOD (PORK) LOCK FLUSH 100 UNIT/ML IV SOLN
500.0000 [IU] | Freq: Once | INTRAVENOUS | Status: DC
  Filled 2021-01-28: qty 5

## 2021-01-28 MED ORDER — HEPARIN SOD (PORK) LOCK FLUSH 100 UNIT/ML IV SOLN
INTRAVENOUS | Status: AC
  Filled 2021-01-28: qty 5

## 2021-01-28 MED ORDER — SODIUM CHLORIDE 0.9% FLUSH
10.0000 mL | Freq: Once | INTRAVENOUS | Status: AC
  Administered 2021-01-28: 10 mL via INTRAVENOUS
  Filled 2021-01-28: qty 10

## 2021-01-28 MED ORDER — SODIUM CHLORIDE 0.9% FLUSH
10.0000 mL | INTRAVENOUS | Status: DC | PRN
  Filled 2021-01-28: qty 10

## 2021-01-28 MED ORDER — SODIUM CHLORIDE 0.9 % IV SOLN
10.0000 mg/kg | Freq: Once | INTRAVENOUS | Status: AC
  Administered 2021-01-28: 740 mg via INTRAVENOUS
  Filled 2021-01-28: qty 10

## 2021-01-28 MED ORDER — HEPARIN SOD (PORK) LOCK FLUSH 100 UNIT/ML IV SOLN
500.0000 [IU] | Freq: Once | INTRAVENOUS | Status: AC | PRN
  Administered 2021-01-28: 500 [IU]
  Filled 2021-01-28: qty 5

## 2021-01-28 MED ORDER — SODIUM CHLORIDE 0.9 % IV SOLN
Freq: Once | INTRAVENOUS | Status: AC
  Filled 2021-01-28: qty 250

## 2021-01-28 NOTE — Patient Instructions (Signed)
[facility]  Discharge Instructions: Thank you for choosing [facility] to provide your oncology and hematology care.  If you have a lab appointment with the [facility], please go directly to the [facility] and check in at the registration area.  Wear comfortable clothing and clothing appropriate for easy access to any Portacath or PICC line.   We strive to give you quality time with your provider. You may need to reschedule your appointment if you arrive late (15 or more minutes).  Arriving late affects you and other patients whose appointments are after yours.  Also, if you miss three or more appointments without notifying the office, you may be dismissed from the clinic at the provider's discretion.      For prescription refill requests, have your pharmacy contact our office and allow 72 hours for refills to be completed.    Today you received the following chemotherapy and/or immunotherapy agents imfinzi      To help prevent nausea and vomiting after your treatment, we encourage you to take your nausea medication as directed.  BELOW ARE SYMPTOMS THAT SHOULD BE REPORTED IMMEDIATELY: . *FEVER GREATER THAN 100.4 F (38 C) OR HIGHER . *CHILLS OR SWEATING . *NAUSEA AND VOMITING THAT IS NOT CONTROLLED WITH YOUR NAUSEA MEDICATION . *UNUSUAL SHORTNESS OF BREATH . *UNUSUAL BRUISING OR BLEEDING . *URINARY PROBLEMS (pain or burning when urinating, or frequent urination) . *BOWEL PROBLEMS (unusual diarrhea, constipation, pain near the anus) . TENDERNESS IN MOUTH AND THROAT WITH OR WITHOUT PRESENCE OF ULCERS (sore throat, sores in mouth, or a toothache) . UNUSUAL RASH, SWELLING OR PAIN  . UNUSUAL VAGINAL DISCHARGE OR ITCHING   Items with * indicate a potential emergency and should be followed up as soon as possible or go to the Emergency Department if any problems should occur.  Please show the CHEMOTHERAPY ALERT CARD or IMMUNOTHERAPY ALERT  CARD at check-in to the Emergency Department and triage nurse.  Should you have questions after your visit or need to cancel or reschedule your appointment, please contact [facility]  [phone] and follow the prompts.  Office hours are 8:00 a.m. to 4:30 p.m. Monday - Friday. Please note that voicemails left after 4:00 p.m. may not be returned until the following business day.  We are closed weekends and major holidays. You have access to a nurse at all times for urgent questions. Please call the main number to the clinic [phone] and follow the prompts.  For any non-urgent questions, you may also contact your provider using MyChart. We now offer e-Visits for anyone 55 and older to request care online for non-urgent symptoms. For details visit [email or web address].   Also download the MyChart app! Go to the app store, search "MyChart", open the app, select [facility], and log in with your MyChart username and password.  Due to Covid, a mask is required upon entering the hospital/clinic. If you do not have a mask, one will be given to you upon arrival. For doctor visits, patients may have 1 support person aged 77 or older with them. For treatment visits, patients cannot have anyone with them due to current Covid guidelines and our immunocompromised population.

## 2021-01-28 NOTE — Progress Notes (Signed)
Pt tolerated all infusions well today and left the chemo suite stable and ambulatory.

## 2021-01-28 NOTE — Progress Notes (Signed)
[facility] NOTE  Patient Care Team: Baxter Hire, MD as PCP - General (Internal Medicine) [name], RN as Oncology Nurse Navigator [name], MD as Consulting Physician (Hematology and Oncology)  CHIEF COMPLAINTS/PURPOSE OF CONSULTATION: Lung cancer  #  Oncology History Overview Note  # NOV 2021- LEFT LOWER LOBE NON-SMALL CELL CA [favor adeno ca]; T2N3 [right hilar; subcarinal LN;[name]; NOV 2021-MRI Brain-NEG  # 11/30- carbo-Taxol-RT [RT until 10/23/20]   # A.fib [Eliquis; Dr.Kowalski]  # # SURVIVORSHIP:   # GENETICS:   #NGS-ordered  DIAGNOSIS: Lung cancer  STAGE:   III      ;  GOALS:  cure  CURRENT/MOST RECENT THERAPY : carbo-Taxol-RT ]   Cancer of lower lobe of left lung ([facility])  08/14/2020 Initial Diagnosis   Cancer of lower lobe of left lung ([facility])   09/02/2020 - 10/22/2020 Chemotherapy    Patient is on Treatment Plan: LUNG DURVALUMAB Q14D      10/16/2020 Cancer Staging   Staging form: Lung, AJCC 8th Edition - Clinical: Stage IIIB (cT2a, cN3, cM0) - Signed by [name], MD on 10/16/2020   12/17/2020 -  Chemotherapy    Patient is on Treatment Plan: LUNG DURVALUMAB Q14D       HISTORY OF PRESENTING ILLNESS:  Jason Warren 87 y.o.  male patient with stage III lung non-small cell lung-currently s/p concurrent chemoradiation is here to proceed with adjuvant duvalumab.  Patient continues to have mild difficulty swallowing.  Not any worse.  Complains of mild joint pains limited Tylenol.  No weight loss.  No nausea vomiting.  Review of Systems  Constitutional: Negative for chills, diaphoresis, fever, malaise/fatigue and weight loss.  HENT: Negative for nosebleeds and sore throat.   Eyes: Negative for double vision.  Respiratory: Positive for cough. Negative for hemoptysis, sputum production, shortness of breath and wheezing.   Cardiovascular: Negative for chest pain, palpitations, orthopnea and leg  swelling.  Gastrointestinal: Negative for abdominal pain, blood in stool, constipation, diarrhea, heartburn, melena, nausea and vomiting.  Genitourinary: Negative for dysuria, frequency and urgency.  Musculoskeletal: Negative for back pain and joint pain.  Skin: Negative for itching.  Neurological: Negative for tingling, focal weakness, weakness and headaches.  Endo/Heme/Allergies: Does not bruise/bleed easily.  Psychiatric/Behavioral: Negative for depression. The patient is not nervous/anxious and does not have insomnia.      MEDICAL HISTORY:  Past Medical History:  Diagnosis Date  . Arthritis   . Dysrhythmia    A-fib  . Hip pain    left  . HOH (hard of hearing)   . LBBB (left bundle branch block) 08/05/2020  . Lung cancer ([place])   . Skin cancer, basal cell     face top of head    SURGICAL HISTORY: Past Surgical History:  Procedure Laterality Date  . CATARACT EXTRACTION W/PHACO Right 07/13/2017   Procedure: CATARACT EXTRACTION PHACO AND INTRAOCULAR LENS PLACEMENT ([facility]);  Surgeon: Leandrew Koyanagi, MD;  Location: [facility];  Service: Ophthalmology;  Laterality: Right;  IVA TOPICAL RIGHT  . CATARACT EXTRACTION W/PHACO Left 01/04/2018   Procedure: CATARACT EXTRACTION PHACO AND INTRAOCULAR LENS PLACEMENT ([facility]) LEFT;  Surgeon: Leandrew Koyanagi, MD;  Location: [facility];  Service: Ophthalmology;  Laterality: Left;  . COLONOSCOPY    . IR IMAGING GUIDED PORT INSERTION  08/27/2020  . JOINT REPLACEMENT     Left total hip Dr. Su Hoff 08-04-18  . ROTATOR CUFF REPAIR Right   . SKIN CANCER EXCISION  face  . TOTAL HIP ARTHROPLASTY Left 08/04/2018   Procedure: TOTAL HIP ARTHROPLASTY ANTERIOR APPROACH;  Surgeon: Frederik Pear, MD;  Location: [facility];  Service: Orthopedics;  Laterality: Left;  Marland Kitchen VIDEO BRONCHOSCOPY WITH ENDOBRONCHIAL NAVIGATION N/A 08/07/2020   Procedure: VIDEO BRONCHOSCOPY WITH ENDOBRONCHIAL NAVIGATION;  Surgeon: Ottie Glazier, MD;  Location:  [facility];  Service: Thoracic;  Laterality: N/A;  . VIDEO BRONCHOSCOPY WITH ENDOBRONCHIAL ULTRASOUND N/A 08/07/2020   Procedure: VIDEO BRONCHOSCOPY WITH ENDOBRONCHIAL ULTRASOUND;  Surgeon: Ottie Glazier, MD;  Location: [facility];  Service: Thoracic;  Laterality: N/A;    SOCIAL HISTORY: Social History   Socioeconomic History  . Marital status: Married    Spouse name: Not on file  . Number of children: Not on file  . Years of education: Not on file  . Highest education level: Not on file  Occupational History  . Not on file  Tobacco Use  . Smoking status: Former Smoker    Packs/day: 1.00    Years: 4.00    Pack years: 4.00    Types: Cigarettes  . Smokeless tobacco: Never Used  . Tobacco comment: quit early 70's  Vaping Use  . Vaping Use: Never used  Substance and Sexual Activity  . Alcohol use: No  . Drug use: No  . Sexual activity: Not Currently  Other Topics Concern  . Not on file  Social History Narrative   > quit 35 years; smoked for 15 years. Rare alcohol. 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    Social Determinants of Health   Financial Resource Strain: Not on file  Food Insecurity: Not on file  Transportation Needs: Not on file  Physical Activity: Not on file  Stress: Not on file  Social Connections: Not on file  Intimate Partner Violence: Not on file    FAMILY HISTORY: Family History  Problem Relation Age of Onset  . Throat cancer Brother         & lung cancer    ALLERGIES:  has No Known Allergies.  MEDICATIONS:  Current Outpatient Medications  Medication Sig Dispense Refill  . acetaminophen (TYLENOL) 500 MG tablet Take 500 mg by mouth every 8 (eight) hours as needed for moderate pain.    Marland Kitchen apixaban (ELIQUIS) 5 MG TABS tablet Take 5 mg by mouth 2 (two) times daily.    . hydroxypropyl methylcellulose / hypromellose (ISOPTO TEARS / GONIOVISC) 2.5 % ophthalmic solution Place 1 drop into the  left eye daily as needed for dry eyes.    . Multiple Vitamin (MULTIVITAMIN) tablet Take 1 tablet by mouth daily.    Vladimir Faster Glycol-Propyl Glycol (SYSTANE FREE OP) Apply 1 drop to eye as needed.    . pravastatin (PRAVACHOL) 80 MG tablet Take 80 mg by mouth at bedtime.     . saw palmetto 160 MG capsule Take 160 mg by mouth 2 (two) times daily.    Marland Kitchen lidocaine-prilocaine (EMLA) cream Apply 1 application topically as needed. Apply small amount to port site at least 1 hour prior to it being accessed, cover with plastic wrap (Patient not taking: No sig reported) 30 g 1  . ondansetron (ZOFRAN) 8 MG tablet One pill every 8 hours as needed for nausea/vomitting. (Patient not taking: No sig reported) 40 tablet 1  . prochlorperazine (COMPAZINE) 10 MG tablet Take 1 tablet (10 mg total) by mouth every 6 (six) hours as needed for nausea or vomiting. (Patient not taking: No  sig reported) 40 tablet 1  . sucralfate (CARAFATE) 1 g tablet Take 1 tablet (1 g total) by mouth 4 (four) times daily -  with meals and at bedtime. (Patient not taking: No sig reported) 90 tablet 1   No current facility-administered medications for this visit.   Facility-Administered Medications Ordered in Other Visits  Medication Dose Route Frequency Provider Last Rate Last Admin  . durvalumab (IMFINZI) 740 mg in sodium chloride 0.9 % 100 mL chemo infusion  10 mg/kg (Order-Specific) Intravenous Once [name], MD 115 mL/hr at 01/28/21 0955 740 mg at 01/28/21 0955  . heparin lock flush 100 unit/mL  500 Units Intravenous Once Charlaine Dalton R, MD      . heparin lock flush 100 unit/mL  500 Units Intracatheter Once PRN Charlaine Dalton R, MD      . sodium chloride flush (NS) 0.9 % injection 10 mL  10 mL Intracatheter PRN [name], MD       PHYSICAL EXAMINATION: ECOG PERFORMANCE STATUS: 1 - Symptomatic but completely ambulatory  Vitals:   01/28/21 0844  BP: 115/71  Pulse: 71  Resp: 16  Temp: (!) 97.2 F  (36.2 C)  SpO2: 100%   Filed Weights   01/28/21 0844  Weight: 165 lb (74.8 kg)    Physical Exam Constitutional:      Comments: Walking independently but with a limp because of left hip arthritis.  He is alone.  HENT:     Head: Normocephalic and atraumatic.     Mouth/Throat:     Pharynx: No oropharyngeal exudate.  Eyes:     Pupils: Pupils are equal, round, and reactive to light.  Cardiovascular:     Rate and Rhythm: Normal rate and regular rhythm.  Pulmonary:     Effort: Pulmonary effort is normal. No respiratory distress.     Breath sounds: Normal breath sounds. No wheezing.  Abdominal:     General: Bowel sounds are normal. There is no distension.     Palpations: Abdomen is soft. There is no mass.     Tenderness: There is no abdominal tenderness. There is no guarding or rebound.  Musculoskeletal:        General: No tenderness. Normal range of motion.     Cervical back: Normal range of motion and neck supple.  Skin:    General: Skin is warm.  Neurological:     Mental Status: He is alert and oriented to person, place, and time.  Psychiatric:        Mood and Affect: Affect normal.      LABORATORY DATA:  I have reviewed the data as listed Lab Results  Component Value Date   WBC 6.3 01/28/2021   HGB 13.4 01/28/2021   HCT 40.6 01/28/2021   MCV 96.4 01/28/2021   PLT 247 01/28/2021   Recent Labs    12/31/20 0835 01/14/21 0830 01/28/21 0824  NA 137 137 137  K 4.0 3.9 4.0  CL 103 104 103  CO2 25 24 24   GLUCOSE 137* 175* 140*  BUN 18 19 21   CREATININE 0.96 1.07 0.99  CALCIUM 9.0 8.7* 8.9  GFRNONAA >60 >60 >60  PROT 6.9 7.1 7.1  ALBUMIN 3.4* 4.0 3.7  AST 24 26 26   ALT 14 16 16   ALKPHOS 119 122 120  BILITOT 0.7 0.9 0.7    RADIOGRAPHIC STUDIES: I have personally reviewed the radiological images as listed and agreed with the findings in the report. No results found.  ASSESSMENT & PLAN:  Cancer of lower lobe of left lung ([place] [facility]) # T2N3- Stage  III-non-small cell lung cancer-[favor adenocarcinoma]- s/p  concurrent chemoradiation weekly carbotaxol with the daily radiation.  finished Feb 2022]; FEB 2022- CT-PR- .currently on  adjuvant durvalumab.  # Proceed with Durvalumab #4 today. Labs today reviewed;  acceptable for treatment today.   # Dysphagia-to solids? RT induced- STABLE; recommend esophagogram; declines.   # arthralgias-grade 1? duvalumab-monitor for now.  # Radiation pneumonitis noted on CT scan grade 1 asymptomatic [cough-dry; NO dyspnea- declines anti-tussive; ambulating- pul ox 96%; ]. STABLE.   #History of A. fib on Eliquis- STABLE.     # DISPOSITION:  # IMFINZI today # follow up in 2 weeks-MD; labs- cbc/cmp; TSH;IMFINZI-Dr.B   All questions were answered. The patient knows to call the clinic with any problems, questions or concerns.   [name], MD 01/28/2021 10:03 AM

## 2021-01-28 NOTE — Assessment & Plan Note (Signed)
#   T2N3- Stage III-non-small cell lung cancer-[favor adenocarcinoma]- s/p  concurrent chemoradiation weekly carbotaxol with the daily radiation.  finished Feb 2022]; FEB 2022- CT-PR- .currently on  adjuvant durvalumab.  # Proceed with Durvalumab #4 today. Labs today reviewed;  acceptable for treatment today.   # Dysphagia-to solids? RT induced- STABLE; recommend esophagogram; declines.   # arthralgias-grade 1? duvalumab-monitor for now.  # Radiation pneumonitis noted on CT scan grade 1 asymptomatic [cough-dry; NO dyspnea- declines anti-tussive; ambulating- pul ox 96%; ]. STABLE.   #History of A. fib on Eliquis- STABLE.     # DISPOSITION:  # IMFINZI today # follow up in 2 weeks-MD; labs- cbc/cmp; TSH;IMFINZI-Dr.B

## 2021-02-11 ENCOUNTER — Inpatient Hospital Stay (HOSPITAL_BASED_OUTPATIENT_CLINIC_OR_DEPARTMENT_OTHER): Payer: Medicare Other | Admitting: Internal Medicine

## 2021-02-11 ENCOUNTER — Ambulatory Visit
Admission: RE | Admit: 2021-02-11 | Discharge: 2021-02-11 | Disposition: A | Discharge status: Home / Self Care | Payer: Medicare Other | Source: Home / Self Care | Attending: Internal Medicine | Admitting: Internal Medicine

## 2021-02-11 ENCOUNTER — Ambulatory Visit
Admission: RE | Admit: 2021-02-11 | Discharge: 2021-02-11 | Disposition: A | Discharge status: Home / Self Care | Payer: Medicare Other | Source: Ambulatory Visit | Attending: Internal Medicine | Admitting: Internal Medicine

## 2021-02-11 ENCOUNTER — Inpatient Hospital Stay: Payer: Medicare Other

## 2021-02-11 ENCOUNTER — Inpatient Hospital Stay: Payer: Medicare Other | Attending: Internal Medicine

## 2021-02-11 ENCOUNTER — Other Ambulatory Visit: Payer: Self-pay

## 2021-02-11 VITALS — BP 115/71 | HR 105 | Temp 100.6°F | Resp 18 | Wt 166.2 lb

## 2021-02-11 DIAGNOSIS — R5383 Other fatigue: Secondary | ICD-10-CM | POA: Diagnosis not present

## 2021-02-11 DIAGNOSIS — Z79899 Other long term (current) drug therapy: Secondary | ICD-10-CM | POA: Insufficient documentation

## 2021-02-11 DIAGNOSIS — C3432 Malignant neoplasm of lower lobe, left bronchus or lung: Secondary | ICD-10-CM | POA: Insufficient documentation

## 2021-02-11 DIAGNOSIS — R059 Cough, unspecified: Secondary | ICD-10-CM

## 2021-02-11 DIAGNOSIS — Z7901 Long term (current) use of anticoagulants: Secondary | ICD-10-CM | POA: Insufficient documentation

## 2021-02-11 DIAGNOSIS — Z452 Encounter for adjustment and management of vascular access device: Secondary | ICD-10-CM | POA: Diagnosis not present

## 2021-02-11 DIAGNOSIS — J189 Pneumonia, unspecified organism: Secondary | ICD-10-CM | POA: Insufficient documentation

## 2021-02-11 DIAGNOSIS — I4891 Unspecified atrial fibrillation: Secondary | ICD-10-CM | POA: Diagnosis not present

## 2021-02-11 LAB — CBC WITH DIFFERENTIAL/PLATELET
Abs Immature Granulocytes: 0.02 10*3/uL (ref 0.00–0.07)
Basophils Absolute: 0 10*3/uL (ref 0.0–0.1)
Basophils Relative: 1 %
Eosinophils Absolute: 0.1 10*3/uL (ref 0.0–0.5)
Eosinophils Relative: 1 %
HCT: 39 % (ref 39.0–52.0)
Hemoglobin: 12.8 g/dL — ABNORMAL LOW (ref 13.0–17.0)
Immature Granulocytes: 0 %
Lymphocytes Relative: 10 %
Lymphs Abs: 0.6 10*3/uL — ABNORMAL LOW (ref 0.7–4.0)
MCH: 31.1 pg (ref 26.0–34.0)
MCHC: 32.8 g/dL (ref 30.0–36.0)
MCV: 94.7 fL (ref 80.0–100.0)
Monocytes Absolute: 0.8 10*3/uL (ref 0.1–1.0)
Monocytes Relative: 13 %
Neutro Abs: 4.6 10*3/uL (ref 1.7–7.7)
Neutrophils Relative %: 75 %
Platelets: 213 10*3/uL (ref 150–400)
RBC: 4.12 MIL/uL — ABNORMAL LOW (ref 4.22–5.81)
RDW: 13.6 % (ref 11.5–15.5)
WBC: 6.2 10*3/uL (ref 4.0–10.5)
nRBC: 0 % (ref 0.0–0.2)

## 2021-02-11 LAB — COMPREHENSIVE METABOLIC PANEL
ALT: 15 U/L (ref 0–44)
AST: 28 U/L (ref 15–41)
Albumin: 3.7 g/dL (ref 3.5–5.0)
Alkaline Phosphatase: 127 U/L — ABNORMAL HIGH (ref 38–126)
Anion gap: 10 (ref 5–15)
BUN: 19 mg/dL (ref 8–23)
CO2: 22 mmol/L (ref 22–32)
Calcium: 8.6 mg/dL — ABNORMAL LOW (ref 8.9–10.3)
Chloride: 102 mmol/L (ref 98–111)
Creatinine, Ser: 1.12 mg/dL (ref 0.61–1.24)
GFR, Estimated: >60 mL/min (ref >60)
Glucose, Bld: 111 mg/dL — ABNORMAL HIGH (ref 70–99)
Potassium: 4.3 mmol/L (ref 3.5–5.1)
Sodium: 134 mmol/L — ABNORMAL LOW (ref 135–145)
Total Bilirubin: 0.7 mg/dL (ref 0.3–1.2)
Total Protein: 6.7 g/dL (ref 6.5–8.1)

## 2021-02-11 LAB — TSH: TSH: 2.967 u[IU]/mL (ref 0.350–4.500)

## 2021-02-11 MED ORDER — SODIUM CHLORIDE 0.9% FLUSH
10.0000 mL | INTRAVENOUS | Status: AC | PRN
  Administered 2021-02-11: 10 mL via INTRAVENOUS
  Filled 2021-02-11: qty 10

## 2021-02-11 MED ORDER — HEPARIN SOD (PORK) LOCK FLUSH 100 UNIT/ML IV SOLN
500.0000 [IU] | Freq: Once | INTRAVENOUS | Status: AC
Start: 2021-02-11 — End: 2021-02-11
  Administered 2021-02-11: 500 [IU] via INTRAVENOUS
  Filled 2021-02-11: qty 5

## 2021-02-11 MED ORDER — PREDNISONE 20 MG PO TABS: ORAL_TABLET | ORAL | 0 refills | Status: DC

## 2021-02-11 NOTE — Assessment & Plan Note (Addendum)
#   T2N3- Stage III-non-small cell lung cancer-[favor adenocarcinoma]- s/p  concurrent chemoradiation weekly carbotaxol with the daily radiation.  finished Feb 2022]; FEB 2022- CT-PR- .currently on  adjuvant durvalumab.  #HOLD Durvalumab #5 today. Labs today reviewed;  Sec to cough  # Cough dry; no dyspnea-oxygen saturation on ambulation 92; at rest 96.  Recommend stat chest x-ray.  Start prednisone 40 mg a day; will reevaluate in 2 weeks.  # Dysphagia-to solids? RT induced- STABLE; recommend esophagogram; declines.   #History of A. fib on Eliquis- STABLE.     # DISPOSITION:  # CXR STAT # HOLD IMFINZI today # follow up in 2 weeks-MD; labs- cbc/cmp; TSH;IMFINZI-Dr.B

## 2021-02-11 NOTE — Progress Notes (Signed)
[place] NOTE  Patient Care Team: Baxter Hire, MD as PCP - General (Internal Medicine) [name], RN as Oncology Nurse Navigator [name], MD as Consulting Physician (Hematology and Oncology)  CHIEF COMPLAINTS/PURPOSE OF CONSULTATION: Lung cancer  #  Oncology History Overview Note  # NOV 2021- LEFT LOWER LOBE NON-SMALL CELL CA [favor adeno ca]; T2N3 [right hilar; subcarinal LN;[name]; NOV 2021-MRI Brain-NEG  # 11/30- carbo-Taxol-RT [RT until 10/23/20]   # A.fib [Eliquis; Dr.Kowalski]  # # SURVIVORSHIP:   # GENETICS:   #NGS-ordered  DIAGNOSIS: Lung cancer  STAGE:   III      ;  GOALS:  cure  CURRENT/MOST RECENT THERAPY : carbo-Taxol-RT ]   Cancer of lower lobe of left lung ([place])  08/14/2020 Initial Diagnosis   Cancer of lower lobe of left lung ([place])   09/02/2020 - 10/22/2020 Chemotherapy    Patient is on Treatment Plan: LUNG DURVALUMAB Q14D      10/16/2020 Cancer Staging   Staging form: Lung, AJCC 8th Edition - Clinical: Stage IIIB (cT2a, cN3, cM0) - Signed by [name], MD on 10/16/2020   12/17/2020 -  Chemotherapy    Patient is on Treatment Plan: LUNG DURVALUMAB Q14D       HISTORY OF PRESENTING ILLNESS:  Jason Warren 87 y.o.  male patient with stage III lung non-small cell lung-currently adjuvant duvalumab.  Patient complains of mild cough dry.  No fevers.  Denies any worsening difficulty with swallowing.  Denies any worsening joint pains.  No weight loss.  No nausea vomiting or diarrhea.   Review of Systems  Constitutional: Negative for chills, diaphoresis, fever, malaise/fatigue and weight loss.  HENT: Negative for nosebleeds and sore throat.   Eyes: Negative for double vision.  Respiratory: Positive for cough. Negative for hemoptysis, sputum production, shortness of breath and wheezing.   Cardiovascular: Negative for chest pain, palpitations, orthopnea and leg swelling.  Gastrointestinal:  Negative for abdominal pain, blood in stool, constipation, diarrhea, heartburn, melena, nausea and vomiting.  Genitourinary: Negative for dysuria, frequency and urgency.  Musculoskeletal: Negative for back pain and joint pain.  Skin: Negative for itching.  Neurological: Negative for tingling, focal weakness, weakness and headaches.  Endo/Heme/Allergies: Does not bruise/bleed easily.  Psychiatric/Behavioral: Negative for depression. The patient is not nervous/anxious and does not have insomnia.      MEDICAL HISTORY:  Past Medical History:  Diagnosis Date  . Arthritis   . Dysrhythmia    A-fib  . Hip pain    left  . HOH (hard of hearing)   . LBBB (left bundle branch block) 08/05/2020  . Lung cancer ([place])   . Skin cancer, basal cell     face top of head    SURGICAL HISTORY: Past Surgical History:  Procedure Laterality Date  . CATARACT EXTRACTION W/PHACO Right 07/13/2017   Procedure: CATARACT EXTRACTION PHACO AND INTRAOCULAR LENS PLACEMENT ([place]);  Surgeon: Leandrew Koyanagi, MD;  Location: [facility];  Service: Ophthalmology;  Laterality: Right;  IVA TOPICAL RIGHT  . CATARACT EXTRACTION W/PHACO Left 01/04/2018   Procedure: CATARACT EXTRACTION PHACO AND INTRAOCULAR LENS PLACEMENT ([place]) LEFT;  Surgeon: Leandrew Koyanagi, MD;  Location: [facility];  Service: Ophthalmology;  Laterality: Left;  . COLONOSCOPY    . IR IMAGING GUIDED PORT INSERTION  08/27/2020  . JOINT REPLACEMENT     Left total hip Dr. Su Hoff 08-04-18  . ROTATOR CUFF REPAIR Right   . SKIN CANCER EXCISION     face  .  TOTAL HIP ARTHROPLASTY Left 08/04/2018   Procedure: TOTAL HIP ARTHROPLASTY ANTERIOR APPROACH;  Surgeon: Frederik Pear, MD;  Location: [facility];  Service: Orthopedics;  Laterality: Left;  Marland Kitchen VIDEO BRONCHOSCOPY WITH ENDOBRONCHIAL NAVIGATION N/A 08/07/2020   Procedure: VIDEO BRONCHOSCOPY WITH ENDOBRONCHIAL NAVIGATION;  Surgeon: Ottie Glazier, MD;  Location: [facility];  Service: Thoracic;   Laterality: N/A;  . VIDEO BRONCHOSCOPY WITH ENDOBRONCHIAL ULTRASOUND N/A 08/07/2020   Procedure: VIDEO BRONCHOSCOPY WITH ENDOBRONCHIAL ULTRASOUND;  Surgeon: Ottie Glazier, MD;  Location: [facility];  Service: Thoracic;  Laterality: N/A;    SOCIAL HISTORY: Social History   Socioeconomic History  . Marital status: Married    Spouse name: Not on file  . Number of children: Not on file  . Years of education: Not on file  . Highest education level: Not on file  Occupational History  . Not on file  Tobacco Use  . Smoking status: Former Smoker    Packs/day: 1.00    Years: 4.00    Pack years: 4.00    Types: Cigarettes  . Smokeless tobacco: Never Used  . Tobacco comment: quit early 70's  Vaping Use  . Vaping Use: Never used  Substance and Sexual Activity  . Alcohol use: No  . Drug use: No  . Sexual activity: Not Currently  Other Topics Concern  . Not on file  Social History Narrative   > quit 35 years; smoked for 15 years. Rare alcohol. 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    Social Determinants of Health   Financial Resource Strain: Not on file  Food Insecurity: Not on file  Transportation Needs: Not on file  Physical Activity: Not on file  Stress: Not on file  Social Connections: Not on file  Intimate Partner Violence: Not on file    FAMILY HISTORY: Family History  Problem Relation Age of Onset  . Throat cancer Brother         & lung cancer    ALLERGIES:  has No Known Allergies.  MEDICATIONS:  Current Outpatient Medications  Medication Sig Dispense Refill  . acetaminophen (TYLENOL) 500 MG tablet Take 500 mg by mouth every 8 (eight) hours as needed for moderate pain.    Marland Kitchen apixaban (ELIQUIS) 5 MG TABS tablet Take 5 mg by mouth 2 (two) times daily.    . hydroxypropyl methylcellulose / hypromellose (ISOPTO TEARS / GONIOVISC) 2.5 % ophthalmic solution Place 1 drop into the left eye daily as needed for  dry eyes.    Marland Kitchen lidocaine-prilocaine (EMLA) cream Apply 1 application topically as needed. Apply small amount to port site at least 1 hour prior to it being accessed, cover with plastic wrap 30 g 1  . Multiple Vitamin (MULTIVITAMIN) tablet Take 1 tablet by mouth daily.    Vladimir Faster Glycol-Propyl Glycol (SYSTANE FREE OP) Apply 1 drop to eye as needed.    . pravastatin (PRAVACHOL) 80 MG tablet Take 80 mg by mouth at bedtime.     . predniSONE (DELTASONE) 20 MG tablet Take 2 pills Once a day with food in AM x1 week; and then 1 pill once a day- 45 tablet 0  . saw palmetto 160 MG capsule Take 160 mg by mouth 2 (two) times daily.    . ondansetron (ZOFRAN) 8 MG tablet One pill every 8 hours as needed for nausea/vomitting. (Patient not taking: No sig reported) 40 tablet 1  . prochlorperazine (COMPAZINE) 10 MG tablet Take 1  tablet (10 mg total) by mouth every 6 (six) hours as needed for nausea or vomiting. (Patient not taking: No sig reported) 40 tablet 1  . sucralfate (CARAFATE) 1 g tablet Take 1 tablet (1 g total) by mouth 4 (four) times daily -  with meals and at bedtime. (Patient not taking: No sig reported) 90 tablet 1   No current facility-administered medications for this visit.   Facility-Administered Medications Ordered in Other Visits  Medication Dose Route Frequency Provider Last Rate Last Admin  . sodium chloride flush (NS) 0.9 % injection 10 mL  10 mL Intravenous PRN [name], MD   10 mL at 02/11/21 0835   PHYSICAL EXAMINATION: ECOG PERFORMANCE STATUS: 1 - Symptomatic but completely ambulatory  Vitals:   02/11/21 0858 02/11/21 0937  BP: 115/71   Pulse: 80 (!) 105  Resp: 18   Temp: (!) 100.6 F (38.1 C)   SpO2: 96% 92%   Filed Weights   02/11/21 0858  Weight: 166 lb 4 oz (75.4 kg)    Physical Exam Constitutional:      Comments: Walking independently but with a limp because of left hip arthritis.  He is alone.  HENT:     Head: Normocephalic and atraumatic.      Mouth/Throat:     Pharynx: No oropharyngeal exudate.  Eyes:     Pupils: Pupils are equal, round, and reactive to light.  Cardiovascular:     Rate and Rhythm: Normal rate and regular rhythm.  Pulmonary:     Effort: Pulmonary effort is normal. No respiratory distress.     Breath sounds: Normal breath sounds. No wheezing.  Abdominal:     General: Bowel sounds are normal. There is no distension.     Palpations: Abdomen is soft. There is no mass.     Tenderness: There is no abdominal tenderness. There is no guarding or rebound.  Musculoskeletal:        General: No tenderness. Normal range of motion.     Cervical back: Normal range of motion and neck supple.  Skin:    General: Skin is warm.  Neurological:     Mental Status: He is alert and oriented to person, place, and time.  Psychiatric:        Mood and Affect: Affect normal.      LABORATORY DATA:  I have reviewed the data as listed Lab Results  Component Value Date   WBC 6.2 02/11/2021   HGB 12.8 (L) 02/11/2021   HCT 39.0 02/11/2021   MCV 94.7 02/11/2021   PLT 213 02/11/2021   Recent Labs    01/14/21 0830 01/28/21 0824 02/11/21 0825  NA 137 137 134*  K 3.9 4.0 4.3  CL 104 103 102  CO2 24 24 22   GLUCOSE 175* 140* 111*  BUN 19 21 19   CREATININE 1.07 0.99 1.12  CALCIUM 8.7* 8.9 8.6*  GFRNONAA >60 >60 >60  PROT 7.1 7.1 6.7  ALBUMIN 4.0 3.7 3.7  AST 26 26 28   ALT 16 16 15   ALKPHOS 122 120 127*  BILITOT 0.9 0.7 0.7    RADIOGRAPHIC STUDIES: I have personally reviewed the radiological images as listed and agreed with the findings in the report. No results found.  ASSESSMENT & PLAN:   Cancer of lower lobe of left lung ([place]) # T2N3- Stage III-non-small cell lung cancer-[favor adenocarcinoma]- s/p  concurrent chemoradiation weekly carbotaxol with the daily radiation.  finished Feb 2022]; FEB 2022- CT-PR- .currently on  adjuvant durvalumab.  #HOLD  Durvalumab #5 today. Labs today reviewed;  Sec to cough  # Cough  dry; no dyspnea-oxygen saturation on ambulation 92; at rest 96.  Recommend stat chest x-ray.  Start prednisone 40 mg a day; will reevaluate in 2 weeks.  # Dysphagia-to solids? RT induced- STABLE; recommend esophagogram; declines.   #History of A. fib on Eliquis- STABLE.     # DISPOSITION:  # CXR STAT # HOLD IMFINZI today # follow up in 2 weeks-MD; labs- cbc/cmp; TSH;IMFINZI-Dr.B   All questions were answered. The patient knows to call the clinic with any problems, questions or concerns.   [name], MD 02/11/2021 11:48 AM

## 2021-02-11 NOTE — Addendum Note (Signed)
Addended by: Delice Bison E on: 02/11/2021 11:59 AM   Modules accepted: Orders

## 2021-02-11 NOTE — Progress Notes (Signed)
Follow-up lung cancer. Pt states he has had a cough the last 2 days. Has taken Robitussin. States that cough is a little better today.

## 2021-02-12 ENCOUNTER — Telehealth: Payer: Self-pay | Admitting: *Deleted

## 2021-02-12 NOTE — Telephone Encounter (Signed)
Jenny/ Josh - please advise     Narrative & Impression CLINICAL DATA:  Cough.  History of lung cancer.  EXAM: CHEST  1 VIEW  COMPARISON:  CT chest dated November 26, 2020. Chest x-ray dated August 07, 2020.  FINDINGS: Unchanged right chest wall port catheter. Bilateral perihilar scarring/post treatment changes have progressed since the prior chest x-ray but are overall similar to more recent chest CT, with the exception of some increased scarring peripherally in the left mid lung. New small left pleural effusion. No consolidation or pneumothorax. No acute osseous abnormality.  IMPRESSION: 1. New small left pleural effusion. Otherwise similar bilateral perihilar scarring/post treatment changes.

## 2021-02-12 NOTE — Telephone Encounter (Signed)
Per Sonia Baller - "I saw his chest x-ray. I don't think we do anything for a pleural effusion that small- Can't do a thoracentesis."

## 2021-02-12 NOTE — Telephone Encounter (Signed)
Spoke with patient. His cough has improved. I encouraged patient to continue with Prednisone as directed. Results reviewed with patient. Pt made aware that he has a small amt of fluid on his lung. There is not sufficient enough amt of left pleural effusion to drain at this time. Pt educated to call our office back should he develop any worsening shortness of breath or cough. If so, we would need to revaluate in the [facility].

## 2021-02-12 NOTE — Telephone Encounter (Signed)
TY Heather!  Faythe Casa, NP 02/12/2021 10:20 AM

## 2021-02-25 ENCOUNTER — Inpatient Hospital Stay (HOSPITAL_BASED_OUTPATIENT_CLINIC_OR_DEPARTMENT_OTHER): Payer: Medicare Other | Admitting: Internal Medicine

## 2021-02-25 ENCOUNTER — Inpatient Hospital Stay: Payer: Medicare Other

## 2021-02-25 ENCOUNTER — Encounter: Payer: Self-pay | Admitting: Internal Medicine

## 2021-02-25 ENCOUNTER — Other Ambulatory Visit: Payer: Self-pay

## 2021-02-25 DIAGNOSIS — R5383 Other fatigue: Secondary | ICD-10-CM

## 2021-02-25 DIAGNOSIS — C3432 Malignant neoplasm of lower lobe, left bronchus or lung: Secondary | ICD-10-CM

## 2021-02-25 LAB — CBC WITH DIFFERENTIAL/PLATELET
Abs Immature Granulocytes: 0.05 10*3/uL (ref 0.00–0.07)
Basophils Absolute: 0 10*3/uL (ref 0.0–0.1)
Basophils Relative: 0 %
Eosinophils Absolute: 0.1 10*3/uL (ref 0.0–0.5)
Eosinophils Relative: 1 %
HCT: 43.6 % (ref 39.0–52.0)
Hemoglobin: 14.4 g/dL (ref 13.0–17.0)
Immature Granulocytes: 1 %
Lymphocytes Relative: 12 %
Lymphs Abs: 0.9 10*3/uL (ref 0.7–4.0)
MCH: 30.9 pg (ref 26.0–34.0)
MCHC: 33 g/dL (ref 30.0–36.0)
MCV: 93.6 fL (ref 80.0–100.0)
Monocytes Absolute: 0.7 10*3/uL (ref 0.1–1.0)
Monocytes Relative: 9 %
Neutro Abs: 6.2 10*3/uL (ref 1.7–7.7)
Neutrophils Relative %: 77 %
Platelets: 193 10*3/uL (ref 150–400)
RBC: 4.66 MIL/uL (ref 4.22–5.81)
RDW: 14 % (ref 11.5–15.5)
WBC: 7.9 10*3/uL (ref 4.0–10.5)
nRBC: 0 % (ref 0.0–0.2)

## 2021-02-25 LAB — COMPREHENSIVE METABOLIC PANEL
ALT: 22 U/L (ref 0–44)
AST: 29 U/L (ref 15–41)
Albumin: 3.7 g/dL (ref 3.5–5.0)
Alkaline Phosphatase: 119 U/L (ref 38–126)
Anion gap: 12 (ref 5–15)
BUN: 23 mg/dL (ref 8–23)
CO2: 24 mmol/L (ref 22–32)
Calcium: 8.6 mg/dL — ABNORMAL LOW (ref 8.9–10.3)
Chloride: 98 mmol/L (ref 98–111)
Creatinine, Ser: 1.05 mg/dL (ref 0.61–1.24)
GFR, Estimated: >60 mL/min (ref >60)
Glucose, Bld: 143 mg/dL — ABNORMAL HIGH (ref 70–99)
Potassium: 3.8 mmol/L (ref 3.5–5.1)
Sodium: 134 mmol/L — ABNORMAL LOW (ref 135–145)
Total Bilirubin: 0.8 mg/dL (ref 0.3–1.2)
Total Protein: 7.1 g/dL (ref 6.5–8.1)

## 2021-02-25 LAB — TSH: TSH: 2.432 u[IU]/mL (ref 0.350–4.500)

## 2021-02-25 MED ORDER — HEPARIN SOD (PORK) LOCK FLUSH 100 UNIT/ML IV SOLN
500.0000 [IU] | Freq: Once | INTRAVENOUS | Status: AC
Start: 2021-02-25 — End: 2021-02-25
  Administered 2021-02-25: 500 [IU] via INTRAVENOUS
  Filled 2021-02-25: qty 5

## 2021-02-25 NOTE — Progress Notes (Signed)
Per MD no treatment today, patient's port was deaccessed, patient discharged. Stable.

## 2021-02-25 NOTE — Assessment & Plan Note (Addendum)
#   T2N3- Stage III-non-small cell lung cancer-[favor adenocarcinoma]- s/p  concurrent chemoradiation weekly carbotaxol with the daily radiation.  finished Feb 2022]; FEB 2022- CT-PR- .currently on  adjuvant durvalumab.  # HELD Durvalumab #5  [2 weeks ago] sec coughLabs today reviewed;  Sec to cough; HOLD durvalumab today #5 today [see below]- plan CT scan.  If no evidence of pneumonitis will resume treatment in 2 weeks.  # Pneumonitis from Durvalumab- G-2 [ Cough dry; no dyspnea-oxygen saturation on ambulation 92;CXR- NEG] - s/p prednisone x2 weeks- improved; repeat scan as above.   # Dysphagia-to solids? RT induced- STABLE; ; recommend esophagogram; declines.   #History of A. fib on Eliquis- STABLE.     #Counseled the patient regarding the importance of managing the adverse events from durvalumab/while continuing therapy.  Patient understands the rationale for holding treatment; will await repeat CT scan as above.  # DISPOSITION:  # HOLD IMFINZI today; [place]-access # follow up in 2 weeks-MD; labs- cbc/cmp; CT chest prior-;IMFINZI-Dr.B

## 2021-02-25 NOTE — Progress Notes (Signed)
[facility] NOTE  Patient Care Team: Baxter Hire, MD as PCP - General (Internal Medicine) [name], RN as Oncology Nurse Navigator [name], MD as Consulting Physician (Hematology and Oncology)  CHIEF COMPLAINTS/PURPOSE OF CONSULTATION: Lung cancer  #  Oncology History Overview Note  # NOV 2021- LEFT LOWER LOBE NON-SMALL CELL CA [favor adeno ca]; T2N3 [right hilar; subcarinal LN;[name]; NOV 2021-MRI Brain-NEG  # 11/30- carbo-Taxol-RT [RT until 10/23/20]   # A.fib [Eliquis; Dr.Kowalski]  # # SURVIVORSHIP:   # GENETICS:   #NGS-ordered  DIAGNOSIS: Lung cancer  STAGE:   III      ;  GOALS:  cure  CURRENT/MOST RECENT THERAPY : carbo-Taxol-RT ]   Cancer of lower lobe of left lung ([place])  08/14/2020 Initial Diagnosis   Cancer of lower lobe of left lung ([place])   09/02/2020 - 10/22/2020 Chemotherapy    Patient is on Treatment Plan: LUNG DURVALUMAB Q14D      10/16/2020 Cancer Staging   Staging form: Lung, AJCC 8th Edition - Clinical: Stage IIIB (cT2a, cN3, cM0) - Signed by [name], MD on 10/16/2020   12/17/2020 -  Chemotherapy    Patient is on Treatment Plan: LUNG DURVALUMAB Q14D       HISTORY OF PRESENTING ILLNESS:  Jason Warren 87 y.o.  male patient with stage III lung non-small cell lung-currently adjuvant duvalumab.  Durvalumab was held 2 weeks ago because of ongoing cough.  Patient s/p prednisone finished today.  Cough is resolved.  Denies any shortness of breath or chest pain.  Denies any difficulty swallowing.  Review of Systems  Constitutional: Negative for chills, diaphoresis, fever, malaise/fatigue and weight loss.  HENT: Negative for nosebleeds and sore throat.   Eyes: Negative for double vision.  Respiratory: Negative for hemoptysis, sputum production, shortness of breath and wheezing.   Cardiovascular: Negative for chest pain, palpitations, orthopnea and leg swelling.  Gastrointestinal:  Negative for abdominal pain, blood in stool, constipation, diarrhea, heartburn, melena, nausea and vomiting.  Genitourinary: Negative for dysuria, frequency and urgency.  Musculoskeletal: Negative for back pain and joint pain.  Skin: Negative for itching.  Neurological: Negative for tingling, focal weakness, weakness and headaches.  Endo/Heme/Allergies: Does not bruise/bleed easily.  Psychiatric/Behavioral: Negative for depression. The patient is not nervous/anxious and does not have insomnia.      MEDICAL HISTORY:  Past Medical History:  Diagnosis Date  . Arthritis   . Dysrhythmia    A-fib  . Hip pain    left  . HOH (hard of hearing)   . LBBB (left bundle branch block) 08/05/2020  . Lung cancer ([place])   . Skin cancer, basal cell     face top of head    SURGICAL HISTORY: Past Surgical History:  Procedure Laterality Date  . CATARACT EXTRACTION W/PHACO Right 07/13/2017   Procedure: CATARACT EXTRACTION PHACO AND INTRAOCULAR LENS PLACEMENT ([place]);  Surgeon: Leandrew Koyanagi, MD;  Location: [facility];  Service: Ophthalmology;  Laterality: Right;  IVA TOPICAL RIGHT  . CATARACT EXTRACTION W/PHACO Left 01/04/2018   Procedure: CATARACT EXTRACTION PHACO AND INTRAOCULAR LENS PLACEMENT ([place]) LEFT;  Surgeon: Leandrew Koyanagi, MD;  Location: [facility];  Service: Ophthalmology;  Laterality: Left;  . COLONOSCOPY    . IR IMAGING GUIDED PORT INSERTION  08/27/2020  . JOINT REPLACEMENT     Left total hip Dr. Su Hoff 08-04-18  . ROTATOR CUFF REPAIR Right   . SKIN CANCER EXCISION     face  . TOTAL  HIP ARTHROPLASTY Left 08/04/2018   Procedure: TOTAL HIP ARTHROPLASTY ANTERIOR APPROACH;  Surgeon: Frederik Pear, MD;  Location: [facility];  Service: Orthopedics;  Laterality: Left;  Marland Kitchen VIDEO BRONCHOSCOPY WITH ENDOBRONCHIAL NAVIGATION N/A 08/07/2020   Procedure: VIDEO BRONCHOSCOPY WITH ENDOBRONCHIAL NAVIGATION;  Surgeon: Ottie Glazier, MD;  Location: [facility];  Service: Thoracic;   Laterality: N/A;  . VIDEO BRONCHOSCOPY WITH ENDOBRONCHIAL ULTRASOUND N/A 08/07/2020   Procedure: VIDEO BRONCHOSCOPY WITH ENDOBRONCHIAL ULTRASOUND;  Surgeon: Ottie Glazier, MD;  Location: [facility];  Service: Thoracic;  Laterality: N/A;    SOCIAL HISTORY: Social History   Socioeconomic History  . Marital status: Married    Spouse name: Not on file  . Number of children: Not on file  . Years of education: Not on file  . Highest education level: Not on file  Occupational History  . Not on file  Tobacco Use  . Smoking status: Former Smoker    Packs/day: 1.00    Years: 4.00    Pack years: 4.00    Types: Cigarettes  . Smokeless tobacco: Never Used  . Tobacco comment: quit early 70's  Vaping Use  . Vaping Use: Never used  Substance and Sexual Activity  . Alcohol use: No  . Drug use: No  . Sexual activity: Not Currently  Other Topics Concern  . Not on file  Social History Narrative   > quit 35 years; smoked for 15 years. Rare alcohol. 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    Social Determinants of Health   Financial Resource Strain: Not on file  Food Insecurity: Not on file  Transportation Needs: Not on file  Physical Activity: Not on file  Stress: Not on file  Social Connections: Not on file  Intimate Partner Violence: Not on file    FAMILY HISTORY: Family History  Problem Relation Age of Onset  . Throat cancer Brother         & lung cancer    ALLERGIES:  has No Known Allergies.  MEDICATIONS:  Current Outpatient Medications  Medication Sig Dispense Refill  . acetaminophen (TYLENOL) 500 MG tablet Take 500 mg by mouth every 8 (eight) hours as needed for moderate pain.    Marland Kitchen apixaban (ELIQUIS) 5 MG TABS tablet Take 5 mg by mouth 2 (two) times daily.    . hydroxypropyl methylcellulose / hypromellose (ISOPTO TEARS / GONIOVISC) 2.5 % ophthalmic solution Place 1 drop into the left eye daily as needed for  dry eyes.    Marland Kitchen lidocaine-prilocaine (EMLA) cream Apply 1 application topically as needed. Apply small amount to port site at least 1 hour prior to it being accessed, cover with plastic wrap 30 g 1  . Multiple Vitamin (MULTIVITAMIN) tablet Take 1 tablet by mouth daily.    Vladimir Faster Glycol-Propyl Glycol (SYSTANE FREE OP) Apply 1 drop to eye as needed.    . pravastatin (PRAVACHOL) 80 MG tablet Take 80 mg by mouth at bedtime.     . saw palmetto 160 MG capsule Take 160 mg by mouth 2 (two) times daily.    . ondansetron (ZOFRAN) 8 MG tablet One pill every 8 hours as needed for nausea/vomitting. (Patient not taking: No sig reported) 40 tablet 1  . predniSONE (DELTASONE) 20 MG tablet Take 2 pills Once a day with food in AM x1 week; and then 1 pill once a day- (Patient not taking: Reported on 02/25/2021) 45 tablet 0  . prochlorperazine (COMPAZINE)  10 MG tablet Take 1 tablet (10 mg total) by mouth every 6 (six) hours as needed for nausea or vomiting. (Patient not taking: No sig reported) 40 tablet 1  . sucralfate (CARAFATE) 1 g tablet Take 1 tablet (1 g total) by mouth 4 (four) times daily -  with meals and at bedtime. (Patient not taking: No sig reported) 90 tablet 1   No current facility-administered medications for this visit.   Facility-Administered Medications Ordered in Other Visits  Medication Dose Route Frequency Provider Last Rate Last Admin  . sodium chloride flush (NS) 0.9 % injection 10 mL  10 mL Intravenous PRN [name], MD   10 mL at 02/11/21 0835   PHYSICAL EXAMINATION: ECOG PERFORMANCE STATUS: 1 - Symptomatic but completely ambulatory  Vitals:   02/25/21 0918  BP: 135/65  Pulse: 76  Resp: 16  Temp: 98 F (36.7 C)  SpO2: 99%   Filed Weights   02/25/21 0918  Weight: 161 lb (73 kg)    Physical Exam Constitutional:      Comments: Walking independently but with a limp because of left hip arthritis.  He is alone.  HENT:     Head: Normocephalic and atraumatic.      Mouth/Throat:     Pharynx: No oropharyngeal exudate.  Eyes:     Pupils: Pupils are equal, round, and reactive to light.  Cardiovascular:     Rate and Rhythm: Normal rate and regular rhythm.  Pulmonary:     Effort: Pulmonary effort is normal. No respiratory distress.     Breath sounds: Normal breath sounds. No wheezing.  Abdominal:     General: Bowel sounds are normal. There is no distension.     Palpations: Abdomen is soft. There is no mass.     Tenderness: There is no abdominal tenderness. There is no guarding or rebound.  Musculoskeletal:        General: No tenderness. Normal range of motion.     Cervical back: Normal range of motion and neck supple.  Skin:    General: Skin is warm.  Neurological:     Mental Status: He is alert and oriented to person, place, and time.  Psychiatric:        Mood and Affect: Affect normal.      LABORATORY DATA:  I have reviewed the data as listed Lab Results  Component Value Date   WBC 7.9 02/25/2021   HGB 14.4 02/25/2021   HCT 43.6 02/25/2021   MCV 93.6 02/25/2021   PLT 193 02/25/2021   Recent Labs    01/28/21 0824 02/11/21 0825 02/25/21 0836  NA 137 134* 134*  K 4.0 4.3 3.8  CL 103 102 98  CO2 24 22 24   GLUCOSE 140* 111* 143*  BUN 21 19 23   CREATININE 0.99 1.12 1.05  CALCIUM 8.9 8.6* 8.6*  GFRNONAA >60 >60 >60  PROT 7.1 6.7 7.1  ALBUMIN 3.7 3.7 3.7  AST 26 28 29   ALT 16 15 22   ALKPHOS 120 127* 119  BILITOT 0.7 0.7 0.8    RADIOGRAPHIC STUDIES: I have personally reviewed the radiological images as listed and agreed with the findings in the report. DG Chest 1 View  Result Date: 02/12/2021 CLINICAL DATA:  Cough.  History of lung cancer. EXAM: CHEST  1 VIEW COMPARISON:  CT chest dated November 26, 2020. Chest x-ray dated August 07, 2020. FINDINGS: Unchanged right chest wall port catheter. Bilateral perihilar scarring/post treatment changes have progressed since the prior chest x-ray but  are overall similar to more recent  chest CT, with the exception of some increased scarring peripherally in the left mid lung. New small left pleural effusion. No consolidation or pneumothorax. No acute osseous abnormality. IMPRESSION: 1. New small left pleural effusion. Otherwise similar bilateral perihilar scarring/post treatment changes. Electronically Signed   By: Titus Dubin M.D.   On: 02/12/2021 09:15    ASSESSMENT & PLAN:   Cancer of lower lobe of left lung ([place]) # T2N3- Stage III-non-small cell lung cancer-[favor adenocarcinoma]- s/p  concurrent chemoradiation weekly carbotaxol with the daily radiation.  finished Feb 2022]; FEB 2022- CT-PR- .currently on  adjuvant durvalumab.  # HELD Durvalumab #5  [2 weeks ago] sec coughLabs today reviewed;  Sec to cough; HOLD durvalumab today #5 today [see below]- plan CT scan.  If no evidence of pneumonitis will resume treatment in 2 weeks.  # Pneumonitis from Durvalumab- G-2 [ Cough dry; no dyspnea-oxygen saturation on ambulation 92;CXR- NEG] - s/p prednisone x2 weeks- improved; repeat scan as above.   # Dysphagia-to solids? RT induced- STABLE; ; recommend esophagogram; declines.   #History of A. fib on Eliquis- STABLE.     #Counseled the patient regarding the importance of managing the adverse events from durvalumab/while continuing therapy.  Patient understands the rationale for holding treatment; will await repeat CT scan as above.  # DISPOSITION:  # HOLD IMFINZI today; DE-access # follow up in 2 weeks-MD; labs- cbc/cmp; CT chest prior-;IMFINZI-Dr.B   All questions were answered. The patient knows to call the clinic with any problems, questions or concerns.   [name], MD 02/25/2021 10:13 AM

## 2021-03-06 ENCOUNTER — Ambulatory Visit
Admission: RE | Admit: 2021-03-06 | Discharge: 2021-03-06 | Disposition: A | Discharge status: Home / Self Care | Payer: Medicare Other | Source: Ambulatory Visit | Attending: Internal Medicine | Admitting: Internal Medicine

## 2021-03-06 ENCOUNTER — Other Ambulatory Visit: Payer: Self-pay

## 2021-03-06 DIAGNOSIS — C3432 Malignant neoplasm of lower lobe, left bronchus or lung: Secondary | ICD-10-CM | POA: Insufficient documentation

## 2021-03-11 ENCOUNTER — Inpatient Hospital Stay (HOSPITAL_BASED_OUTPATIENT_CLINIC_OR_DEPARTMENT_OTHER): Payer: Medicare Other | Admitting: Internal Medicine

## 2021-03-11 ENCOUNTER — Other Ambulatory Visit: Payer: Self-pay

## 2021-03-11 ENCOUNTER — Inpatient Hospital Stay: Payer: Medicare Other | Attending: Internal Medicine

## 2021-03-11 ENCOUNTER — Inpatient Hospital Stay: Payer: Medicare Other

## 2021-03-11 ENCOUNTER — Encounter: Payer: Self-pay | Admitting: Internal Medicine

## 2021-03-11 DIAGNOSIS — I4891 Unspecified atrial fibrillation: Secondary | ICD-10-CM | POA: Insufficient documentation

## 2021-03-11 DIAGNOSIS — C3432 Malignant neoplasm of lower lobe, left bronchus or lung: Secondary | ICD-10-CM | POA: Diagnosis present

## 2021-03-11 DIAGNOSIS — Z5112 Encounter for antineoplastic immunotherapy: Secondary | ICD-10-CM | POA: Insufficient documentation

## 2021-03-11 DIAGNOSIS — Z452 Encounter for adjustment and management of vascular access device: Secondary | ICD-10-CM | POA: Insufficient documentation

## 2021-03-11 DIAGNOSIS — Z7901 Long term (current) use of anticoagulants: Secondary | ICD-10-CM | POA: Diagnosis not present

## 2021-03-11 LAB — CBC WITH DIFFERENTIAL/PLATELET
Abs Immature Granulocytes: 0.02 10*3/uL (ref 0.00–0.07)
Basophils Absolute: 0 10*3/uL (ref 0.0–0.1)
Basophils Relative: 0 %
Eosinophils Absolute: 0.3 10*3/uL (ref 0.0–0.5)
Eosinophils Relative: 5 %
HCT: 40.2 % (ref 39.0–52.0)
Hemoglobin: 13.4 g/dL (ref 13.0–17.0)
Immature Granulocytes: 0 %
Lymphocytes Relative: 19 %
Lymphs Abs: 1 10*3/uL (ref 0.7–4.0)
MCH: 31.3 pg (ref 26.0–34.0)
MCHC: 33.3 g/dL (ref 30.0–36.0)
MCV: 93.9 fL (ref 80.0–100.0)
Monocytes Absolute: 0.6 10*3/uL (ref 0.1–1.0)
Monocytes Relative: 11 %
Neutro Abs: 3.3 10*3/uL (ref 1.7–7.7)
Neutrophils Relative %: 65 %
Platelets: 331 10*3/uL (ref 150–400)
RBC: 4.28 MIL/uL (ref 4.22–5.81)
RDW: 13.8 % (ref 11.5–15.5)
WBC: 5.2 10*3/uL (ref 4.0–10.5)
nRBC: 0 % (ref 0.0–0.2)

## 2021-03-11 LAB — COMPREHENSIVE METABOLIC PANEL
ALT: 17 U/L (ref 0–44)
AST: 24 U/L (ref 15–41)
Albumin: 3.7 g/dL (ref 3.5–5.0)
Alkaline Phosphatase: 123 U/L (ref 38–126)
Anion gap: 10 (ref 5–15)
BUN: 19 mg/dL (ref 8–23)
CO2: 23 mmol/L (ref 22–32)
Calcium: 8.7 mg/dL — ABNORMAL LOW (ref 8.9–10.3)
Chloride: 105 mmol/L (ref 98–111)
Creatinine, Ser: 0.93 mg/dL (ref 0.61–1.24)
GFR, Estimated: >60 mL/min (ref >60)
Glucose, Bld: 109 mg/dL — ABNORMAL HIGH (ref 70–99)
Potassium: 4.2 mmol/L (ref 3.5–5.1)
Sodium: 138 mmol/L (ref 135–145)
Total Bilirubin: 0.6 mg/dL (ref 0.3–1.2)
Total Protein: 6.7 g/dL (ref 6.5–8.1)

## 2021-03-11 MED ORDER — HEPARIN SOD (PORK) LOCK FLUSH 100 UNIT/ML IV SOLN
500.0000 [IU] | Freq: Once | INTRAVENOUS | Status: AC
  Administered 2021-03-11: 500 [IU] via INTRAVENOUS
  Filled 2021-03-11: qty 5

## 2021-03-11 MED ORDER — SODIUM CHLORIDE 0.9% FLUSH
10.0000 mL | Freq: Once | INTRAVENOUS | Status: AC
  Administered 2021-03-11: 10 mL via INTRAVENOUS
  Filled 2021-03-11: qty 10

## 2021-03-11 MED ORDER — AZITHROMYCIN 250 MG PO TABS: ORAL_TABLET | ORAL | 0 refills | Status: DC

## 2021-03-11 NOTE — Assessment & Plan Note (Addendum)
#   T2N3- Stage III-non-small cell lung cancer-[favor adenocarcinoma]- s/p  concurrent chemoradiation; Finished Feb 2022]; currently on adjuvant durvalumab.  However durvalumab currently on hold because of recent cough/suspicion for pneumonitis; CT June 2022-stable left lower lobe malignancy/radiation changes.  However patchy groundglass opacities bilateral lung [see below].  #Hold durvalumab today given possible infectious findings noted on CT scan.  Patient anxious about holding therapy; however discussed the rationale for holding therapy given suspicion for active infection.  #Bilateral groundglass nodular/consolidative-suspect pneumonia vs inflammation from durvalumab [patient s/p prednisone]- Recommended trial of Z-Pak  # Dysphagia-to solids? RT induced-stable  #History of A. fib on Eliquis- STABLE.      # DISPOSITION:  # HOLD IMFINZI today; DE-access # follow up in 2 weeks-MD; labs- cbc/cmp; -;IMFINZI-Dr.B

## 2021-03-11 NOTE — Progress Notes (Signed)
[facility] NOTE  Patient Care Team: Baxter Hire, MD as PCP - General (Internal Medicine) [name], RN as Oncology Nurse Navigator [name], MD as Consulting Physician (Hematology and Oncology)  CHIEF COMPLAINTS/PURPOSE OF CONSULTATION: Lung cancer  #  Oncology History Overview Note  # NOV 2021- LEFT LOWER LOBE NON-SMALL CELL CA [favor adeno ca]; T2N3 [right hilar; subcarinal LN;[name]; NOV 2021-MRI Brain-NEG  # 11/30- carbo-Taxol-RT [RT until 10/23/20]   # A.fib [Eliquis; Dr.Kowalski]  # # SURVIVORSHIP:   # GENETICS:   #NGS-ordered  DIAGNOSIS: Lung cancer  STAGE:   III      ;  GOALS:  cure  CURRENT/MOST RECENT THERAPY : carbo-Taxol-RT ]   Cancer of lower lobe of left lung ([place])  08/14/2020 Initial Diagnosis   Cancer of lower lobe of left lung ([place])   09/02/2020 - 10/22/2020 Chemotherapy    Patient is on Treatment Plan: LUNG DURVALUMAB Q14D       10/16/2020 Cancer Staging   Staging form: Lung, AJCC 8th Edition - Clinical: Stage IIIB (cT2a, cN3, cM0) - Signed by [name], MD on 10/16/2020    12/17/2020 -  Chemotherapy    Patient is on Treatment Plan: LUNG DURVALUMAB Q14D        HISTORY OF PRESENTING ILLNESS:  Jason Warren 87 y.o.  male patient with stage III lung non-small cell lung-currently adjuvant duvalumab/review results of the CT scan.  Patient's durvalumab has been held for the last 4 weeks because of ongoing cough.  Patient's cough is improved.  Denies any nausea vomiting denies any headaches.  Difficulty swallowing improved.  Review of Systems  Constitutional:  Negative for chills, diaphoresis, fever, malaise/fatigue and weight loss.  HENT:  Negative for nosebleeds and sore throat.   Eyes:  Negative for double vision.  Respiratory:  Negative for hemoptysis, sputum production, shortness of breath and wheezing.   Cardiovascular:  Negative for chest pain, palpitations, orthopnea and leg  swelling.  Gastrointestinal:  Negative for abdominal pain, blood in stool, constipation, diarrhea, heartburn, melena, nausea and vomiting.  Genitourinary:  Negative for dysuria, frequency and urgency.  Musculoskeletal:  Negative for back pain and joint pain.  Skin:  Negative for itching.  Neurological:  Negative for tingling, focal weakness, weakness and headaches.  Endo/Heme/Allergies:  Does not bruise/bleed easily.  Psychiatric/Behavioral:  Negative for depression. The patient is not nervous/anxious and does not have insomnia.     MEDICAL HISTORY:  Past Medical History:  Diagnosis Date   Arthritis    Dysrhythmia    A-fib   Hip pain    left   HOH (hard of hearing)    LBBB (left bundle branch block) 08/05/2020   Lung cancer ([place])    Skin cancer, basal cell     face top of head    SURGICAL HISTORY: Past Surgical History:  Procedure Laterality Date   CATARACT EXTRACTION W/PHACO Right 07/13/2017   Procedure: CATARACT EXTRACTION PHACO AND INTRAOCULAR LENS PLACEMENT ([place]);  Surgeon: Leandrew Koyanagi, MD;  Location: [facility];  Service: Ophthalmology;  Laterality: Right;  IVA TOPICAL RIGHT   CATARACT EXTRACTION W/PHACO Left 01/04/2018   Procedure: CATARACT EXTRACTION PHACO AND INTRAOCULAR LENS PLACEMENT ([place]) LEFT;  Surgeon: Leandrew Koyanagi, MD;  Location: [facility];  Service: Ophthalmology;  Laterality: Left;   COLONOSCOPY     IR IMAGING GUIDED PORT INSERTION  08/27/2020   JOINT REPLACEMENT     Left total hip Dr. Su Hoff 08-04-18   ROTATOR CUFF  REPAIR Right    SKIN CANCER EXCISION     face   TOTAL HIP ARTHROPLASTY Left 08/04/2018   Procedure: TOTAL HIP ARTHROPLASTY ANTERIOR APPROACH;  Surgeon: Frederik Pear, MD;  Location: [facility];  Service: Orthopedics;  Laterality: Left;   VIDEO BRONCHOSCOPY WITH ENDOBRONCHIAL NAVIGATION N/A 08/07/2020   Procedure: VIDEO BRONCHOSCOPY WITH ENDOBRONCHIAL NAVIGATION;  Surgeon: Ottie Glazier, MD;  Location: [facility];   Service: Thoracic;  Laterality: N/A;   VIDEO BRONCHOSCOPY WITH ENDOBRONCHIAL ULTRASOUND N/A 08/07/2020   Procedure: VIDEO BRONCHOSCOPY WITH ENDOBRONCHIAL ULTRASOUND;  Surgeon: Ottie Glazier, MD;  Location: [facility];  Service: Thoracic;  Laterality: N/A;    SOCIAL HISTORY: Social History   Socioeconomic History   Marital status: Married    Spouse name: Not on file   Number of children: Not on file   Years of education: Not on file   Highest education level: Not on file  Occupational History   Not on file  Tobacco Use   Smoking status: Former    Packs/day: 1.00    Years: 4.00    Pack years: 4.00    Types: Cigarettes   Smokeless tobacco: Never   Tobacco comments:    quit early 70's  Vaping Use   Vaping Use: Never used  Substance and Sexual Activity   Alcohol use: No   Drug use: No   Sexual activity: Not Currently  Other Topics Concern   Not on file  Social History Narrative   > quit 35 years; smoked for 15 years. Rare alcohol. 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    Social Determinants of Health   Financial Resource Strain: Not on file  Food Insecurity: Not on file  Transportation Needs: Not on file  Physical Activity: Not on file  Stress: Not on file  Social Connections: Not on file  Intimate Partner Violence: Not on file    FAMILY HISTORY: Family History  Problem Relation Age of Onset   Throat cancer Brother         & lung cancer    ALLERGIES:  has No Known Allergies.  MEDICATIONS:  Current Outpatient Medications  Medication Sig Dispense Refill   acetaminophen (TYLENOL) 500 MG tablet Take 500 mg by mouth every 8 (eight) hours as needed for moderate pain.     apixaban (ELIQUIS) 5 MG TABS tablet Take 5 mg by mouth 2 (two) times daily.     azithromycin (ZITHROMAX) 250 MG tablet Take 2 on day 1; and then 1 pill once a day. 8 each 0   hydroxypropyl methylcellulose / hypromellose (ISOPTO TEARS /  GONIOVISC) 2.5 % ophthalmic solution Place 1 drop into the left eye daily as needed for dry eyes.     lidocaine-prilocaine (EMLA) cream Apply 1 application topically as needed. Apply small amount to port site at least 1 hour prior to it being accessed, cover with plastic wrap 30 g 1   Multiple Vitamin (MULTIVITAMIN) tablet Take 1 tablet by mouth daily.     Polyethyl Glycol-Propyl Glycol (SYSTANE FREE OP) Apply 1 drop to eye as needed.     pravastatin (PRAVACHOL) 80 MG tablet Take 80 mg by mouth at bedtime.      saw palmetto 160 MG capsule Take 160 mg by mouth 2 (two) times daily.     ondansetron (ZOFRAN) 8 MG tablet One pill every 8 hours as needed for nausea/vomitting. (Patient not taking: No sig reported) 40 tablet 1  predniSONE (DELTASONE) 20 MG tablet Take 2 pills Once a day with food in AM x1 week; and then 1 pill once a day- (Patient not taking: No sig reported) 45 tablet 0   prochlorperazine (COMPAZINE) 10 MG tablet Take 1 tablet (10 mg total) by mouth every 6 (six) hours as needed for nausea or vomiting. (Patient not taking: No sig reported) 40 tablet 1   sucralfate (CARAFATE) 1 g tablet Take 1 tablet (1 g total) by mouth 4 (four) times daily -  with meals and at bedtime. (Patient not taking: No sig reported) 90 tablet 1   No current facility-administered medications for this visit.   Facility-Administered Medications Ordered in Other Visits  Medication Dose Route Frequency Provider Last Rate Last Admin   sodium chloride flush (NS) 0.9 % injection 10 mL  10 mL Intravenous PRN [name], MD   10 mL at 02/11/21 0835   PHYSICAL EXAMINATION: ECOG PERFORMANCE STATUS: 1 - Symptomatic but completely ambulatory  Vitals:   03/11/21 0945  BP: 135/74  Pulse: 71  Resp: 16  Temp: (!) 97.5 F (36.4 C)  SpO2: 100%   Filed Weights   03/11/21 0945  Weight: 163 lb (73.9 kg)    Physical Exam Constitutional:      Comments: Walking independently but with a limp because of left hip  arthritis.  He is alone.  HENT:     Head: Normocephalic and atraumatic.     Mouth/Throat:     Pharynx: No oropharyngeal exudate.  Eyes:     Pupils: Pupils are equal, round, and reactive to light.  Cardiovascular:     Rate and Rhythm: Normal rate and regular rhythm.  Pulmonary:     Effort: Pulmonary effort is normal. No respiratory distress.     Breath sounds: Normal breath sounds. No wheezing.  Abdominal:     General: Bowel sounds are normal. There is no distension.     Palpations: Abdomen is soft. There is no mass.     Tenderness: no abdominal tenderness There is no guarding or rebound.  Musculoskeletal:        General: No tenderness. Normal range of motion.     Cervical back: Normal range of motion and neck supple.  Skin:    General: Skin is warm.  Neurological:     Mental Status: He is alert and oriented to person, place, and time.  Psychiatric:        Mood and Affect: Affect normal.     LABORATORY DATA:  I have reviewed the data as listed Lab Results  Component Value Date   WBC 5.2 03/11/2021   HGB 13.4 03/11/2021   HCT 40.2 03/11/2021   MCV 93.9 03/11/2021   PLT 331 03/11/2021   Recent Labs    02/11/21 0825 02/25/21 0836 03/11/21 0917  NA 134* 134* 138  K 4.3 3.8 4.2  CL 102 98 105  CO2 22 24 23   GLUCOSE 111* 143* 109*  BUN 19 23 19   CREATININE 1.12 1.05 0.93  CALCIUM 8.6* 8.6* 8.7*  GFRNONAA >60 >60 >60  PROT 6.7 7.1 6.7  ALBUMIN 3.7 3.7 3.7  AST 28 29 24   ALT 15 22 17   ALKPHOS 127* 119 123  BILITOT 0.7 0.8 0.6    RADIOGRAPHIC STUDIES: I have personally reviewed the radiological images as listed and agreed with the findings in the report. CT Chest Wo Contrast  Result Date: 03/07/2021 CLINICAL DATA:  Restaging non-small cell lung cancer EXAM: CT CHEST WITHOUT CONTRAST  TECHNIQUE: Multidetector CT imaging of the chest was performed following the standard protocol without IV contrast. COMPARISON:  11/26/2020 FINDINGS: Cardiovascular: Right Port-A-Cath  tip: Cavoatrial junction. Coronary, aortic arch, and branch vessel atherosclerotic vascular disease. Mediastinum/Nodes: Scattered small mediastinal lymph nodes are again observed. Potential right eccentric subcarinal node versus small pericardial fluid collection in the subcarinal region measuring 1.4 cm in short axis on image 93 series 2, formerly 1.0 cm. There also some areas of mild indistinct stranding in the mediastinal adipose tissue for example along the left paratracheal region on image 64 series 2 similar to previous. Lungs/Pleura: Small left pleural effusion increased from prior and potentially with loculated components. Bilateral pleural calcifications. Similar region of cylindrical and cystic bronchiectasis in the superior segment right lower lobe with associated surrounding consolidation. New patchy peripheral ground-glass and airspace opacities in both lungs are observed, raising suspicion for multilobar infection, although some of the regions of prior infection have improved. In the area of the prior left lower lobe cavitary lesion, there is substantial new volume loss and consolidation which is possibly therapy related. Progressive consolidation and volume loss in the left paramediastinal region which is likely therapy related. The anterior right upper lobe ground-glass opacities shown on the prior exam have resolved. Upper Abdomen: Stable hypodense lesion in the lateral segment left hepatic lobe, probably a cyst, image 139 series 2. Contracted gallbladder. Abdominal aortic atherosclerotic calcification. Musculoskeletal: Stable L1 compression fracture. IMPRESSION: 1. Likely therapy related consolidation and volume loss in the left lower lobe in the vicinity of the prior cavitary spiculated lesion. 2. Stable appearance of consolidation and bronchiectasis in the superior segment right lower lobe may also be therapy related. 3. Patchy new areas of ground-glass, consolidative, and nodular opacities in  the lungs in a pattern favoring multilobar infection/pneumonia. Surveillance is recommended to exclude malignant component. 4. Small but increased loculated left pleural effusion. 5. Other imaging findings of potential clinical significance: Aortic Atherosclerosis (ICD10-I70.0). Coronary atherosclerosis. Chronic bilateral pleural calcifications. Stable L1 compression fracture. Electronically Signed   By: Van Clines M.D.   On: 03/07/2021 12:13     ASSESSMENT & PLAN:   Cancer of lower lobe of left lung ([place]) # T2N3- Stage III-non-small cell lung cancer-[favor adenocarcinoma]- s/p  concurrent chemoradiation; Finished Feb 2022]; currently on adjuvant durvalumab.  However durvalumab currently on hold because of recent cough/suspicion for pneumonitis; CT June 2022-stable left lower lobe malignancy/radiation changes.  However patchy groundglass opacities bilateral lung [see below].  #Hold durvalumab today given possible infectious findings noted on CT scan.  Patient anxious about holding therapy; however discussed the rationale for holding therapy given suspicion for active infection.  #Bilateral groundglass nodular/consolidative-suspect pneumonia vs inflammation from durvalumab [patient s/p prednisone]- Recommended trial of Z-Pak  # Dysphagia-to solids? RT induced-stable  #History of A. fib on Eliquis- STABLE.      # DISPOSITION:  # HOLD IMFINZI today; DE-access # follow up in 2 weeks-MD; labs- cbc/cmp; -;IMFINZI-Dr.B  All questions were answered. The patient knows to call the clinic with any problems, questions or concerns.   [name], MD 03/22/2021 7:07 PM

## 2021-03-22 ENCOUNTER — Encounter: Payer: Self-pay | Admitting: Internal Medicine

## 2021-03-25 ENCOUNTER — Encounter: Payer: Self-pay | Admitting: Internal Medicine

## 2021-03-25 ENCOUNTER — Other Ambulatory Visit: Payer: Self-pay

## 2021-03-25 ENCOUNTER — Inpatient Hospital Stay: Payer: Medicare Other

## 2021-03-25 ENCOUNTER — Inpatient Hospital Stay (HOSPITAL_BASED_OUTPATIENT_CLINIC_OR_DEPARTMENT_OTHER): Payer: Medicare Other | Admitting: Internal Medicine

## 2021-03-25 DIAGNOSIS — C3432 Malignant neoplasm of lower lobe, left bronchus or lung: Secondary | ICD-10-CM

## 2021-03-25 DIAGNOSIS — Z5112 Encounter for antineoplastic immunotherapy: Secondary | ICD-10-CM | POA: Diagnosis not present

## 2021-03-25 LAB — CBC WITH DIFFERENTIAL/PLATELET
Abs Immature Granulocytes: 0.01 10*3/uL (ref 0.00–0.07)
Basophils Absolute: 0 10*3/uL (ref 0.0–0.1)
Basophils Relative: 1 %
Eosinophils Absolute: 0.2 10*3/uL (ref 0.0–0.5)
Eosinophils Relative: 5 %
HCT: 39.2 % (ref 39.0–52.0)
Hemoglobin: 13.2 g/dL (ref 13.0–17.0)
Immature Granulocytes: 0 %
Lymphocytes Relative: 17 %
Lymphs Abs: 0.8 10*3/uL (ref 0.7–4.0)
MCH: 31.7 pg (ref 26.0–34.0)
MCHC: 33.7 g/dL (ref 30.0–36.0)
MCV: 94 fL (ref 80.0–100.0)
Monocytes Absolute: 0.7 10*3/uL (ref 0.1–1.0)
Monocytes Relative: 14 %
Neutro Abs: 3.1 10*3/uL (ref 1.7–7.7)
Neutrophils Relative %: 63 %
Platelets: 216 10*3/uL (ref 150–400)
RBC: 4.17 MIL/uL — ABNORMAL LOW (ref 4.22–5.81)
RDW: 14.3 % (ref 11.5–15.5)
WBC: 4.8 10*3/uL (ref 4.0–10.5)
nRBC: 0 % (ref 0.0–0.2)

## 2021-03-25 LAB — COMPREHENSIVE METABOLIC PANEL
ALT: 15 U/L (ref 0–44)
AST: 27 U/L (ref 15–41)
Albumin: 3.6 g/dL (ref 3.5–5.0)
Alkaline Phosphatase: 117 U/L (ref 38–126)
Anion gap: 8 (ref 5–15)
BUN: 16 mg/dL (ref 8–23)
CO2: 25 mmol/L (ref 22–32)
Calcium: 8.7 mg/dL — ABNORMAL LOW (ref 8.9–10.3)
Chloride: 106 mmol/L (ref 98–111)
Creatinine, Ser: 0.95 mg/dL (ref 0.61–1.24)
GFR, Estimated: >60 mL/min (ref >60)
Glucose, Bld: 84 mg/dL (ref 70–99)
Potassium: 4.3 mmol/L (ref 3.5–5.1)
Sodium: 139 mmol/L (ref 135–145)
Total Bilirubin: 1 mg/dL (ref 0.3–1.2)
Total Protein: 6.8 g/dL (ref 6.5–8.1)

## 2021-03-25 MED ORDER — SODIUM CHLORIDE 0.9% FLUSH
10.0000 mL | Freq: Once | INTRAVENOUS | Status: AC
  Administered 2021-03-25: 10 mL via INTRAVENOUS
  Filled 2021-03-25: qty 10

## 2021-03-25 MED ORDER — HEPARIN SOD (PORK) LOCK FLUSH 100 UNIT/ML IV SOLN
INTRAVENOUS | Status: AC
  Filled 2021-03-25: qty 5

## 2021-03-25 MED ORDER — HEPARIN SOD (PORK) LOCK FLUSH 100 UNIT/ML IV SOLN
500.0000 [IU] | Freq: Once | INTRAVENOUS | Status: AC | PRN
  Administered 2021-03-25: 500 [IU]
  Filled 2021-03-25: qty 5

## 2021-03-25 MED ORDER — SODIUM CHLORIDE 0.9 % IV SOLN
10.0000 mg/kg | Freq: Once | INTRAVENOUS | Status: AC
  Administered 2021-03-25: 740 mg via INTRAVENOUS
  Filled 2021-03-25: qty 10

## 2021-03-25 MED ORDER — SODIUM CHLORIDE 0.9 % IV SOLN
Freq: Once | INTRAVENOUS | Status: AC
  Filled 2021-03-25: qty 250

## 2021-03-25 NOTE — Patient Instructions (Signed)
[facility]  Discharge Instructions: Thank you for choosing [facility] to provide your oncology and hematology care.  If you have a lab appointment with the [facility], please go directly to the [facility] and check in at the registration area.  Wear comfortable clothing and clothing appropriate for easy access to any Portacath or PICC line.   We strive to give you quality time with your provider. You may need to reschedule your appointment if you arrive late (15 or more minutes).  Arriving late affects you and other patients whose appointments are after yours.  Also, if you miss three or more appointments without notifying the office, you may be dismissed from the clinic at the provider's discretion.      For prescription refill requests, have your pharmacy contact our office and allow 72 hours for refills to be completed.    Today you received the following chemotherapy and/or immunotherapy agents - durvalumab      To help prevent nausea and vomiting after your treatment, we encourage you to take your nausea medication as directed.  BELOW ARE SYMPTOMS THAT SHOULD BE REPORTED IMMEDIATELY: *FEVER GREATER THAN 100.4 F (38 C) OR HIGHER *CHILLS OR SWEATING *NAUSEA AND VOMITING THAT IS NOT CONTROLLED WITH YOUR NAUSEA MEDICATION *UNUSUAL SHORTNESS OF BREATH *UNUSUAL BRUISING OR BLEEDING *URINARY PROBLEMS (pain or burning when urinating, or frequent urination) *BOWEL PROBLEMS (unusual diarrhea, constipation, pain near the anus) TENDERNESS IN MOUTH AND THROAT WITH OR WITHOUT PRESENCE OF ULCERS (sore throat, sores in mouth, or a toothache) UNUSUAL RASH, SWELLING OR PAIN  UNUSUAL VAGINAL DISCHARGE OR ITCHING   Items with * indicate a potential emergency and should be followed up as soon as possible or go to the Emergency Department if any problems should occur.  Please show the CHEMOTHERAPY ALERT CARD or IMMUNOTHERAPY ALERT CARD at  check-in to the Emergency Department and triage nurse.  Should you have questions after your visit or need to cancel or reschedule your appointment, please contact [facility]  [phone] and follow the prompts.  Office hours are 8:00 a.m. to 4:30 p.m. Monday - Friday. Please note that voicemails left after 4:00 p.m. may not be returned until the following business day.  We are closed weekends and major holidays. You have access to a nurse at all times for urgent questions. Please call the main number to the clinic [phone] and follow the prompts.  For any non-urgent questions, you may also contact your provider using MyChart. We now offer e-Visits for anyone 59 and older to request care online for non-urgent symptoms. For details visit [email or web address].   Also download the MyChart app! Go to the app store, search "MyChart", open the app, select [facility], and log in with your MyChart username and password.  Due to Covid, a mask is required upon entering the hospital/clinic. If you do not have a mask, one will be given to you upon arrival. For doctor visits, patients may have 1 support person aged 23 or older with them. For treatment visits, patients cannot have anyone with them due to current Covid guidelines and our immunocompromised population.   Durvalumab injection What is this medication? DURVALUMAB (dur VAL ue mab) is a monoclonal antibody. It is used to treat lungcancer. This medicine may be used for other purposes; ask your health care provider orpharmacist if you have questions. COMMON BRAND NAME(S): IMFINZI What should I tell my care team before I take this  medication? They need to know if you have any of these conditions: autoimmune diseases like Crohn's disease, ulcerative colitis, or lupus have had or planning to have an allogeneic stem cell transplant (uses someone else's stem cells) history of organ transplant history of radiation  to the chest nervous system problems like myasthenia gravis or Guillain-Barre syndrome an unusual or allergic reaction to durvalumab, other medicines, foods, dyes, or preservatives pregnant or trying to get pregnant breast-feeding How should I use this medication? This medicine is for infusion into a vein. It is given by a health careprofessional in a hospital or clinic setting. A special MedGuide will be given to you before each treatment. Be sure to readthis information carefully each time. Talk to your pediatrician regarding the use of this medicine in children.Special care may be needed. Overdosage: If you think you have taken too much of this medicine contact apoison control center or emergency room at once. NOTE: This medicine is only for you. Do not share this medicine with others. What if I miss a dose? It is important not to miss your dose. Call your doctor or health careprofessional if you are unable to keep an appointment. What may interact with this medication? Interactions have not been studied. This list may not describe all possible interactions. Give your health care provider a list of all the medicines, herbs, non-prescription drugs, or dietary supplements you use. Also tell them if you smoke, drink alcohol, or use illegaldrugs. Some items may interact with your medicine. What should I watch for while using this medication? This drug may make you feel generally unwell. Continue your course of treatmenteven though you feel ill unless your doctor tells you to stop. You may need blood work done while you are taking this medicine. Do not become pregnant while taking this medicine or for 3 months after stopping it. Women should inform their doctor if they wish to become pregnant or think they might be pregnant. There is a potential for serious side effects to an unborn child. Talk to your health care professional or pharmacist for more information. Do not breast-feed an infant while  taking this medicine orfor 3 months after stopping it. What side effects may I notice from receiving this medication? Side effects that you should report to your doctor or health care professionalas soon as possible: allergic reactions like skin rash, itching or hives, swelling of the face, lips, or tongue black, tarry stools bloody or watery diarrhea breathing problems change in emotions or moods change in sex drive changes in vision chest pain or chest tightness chills confusion cough facial flushing fever headache signs and symptoms of high blood sugar such as dizziness; dry mouth; dry skin; fruity breath; nausea; stomach pain; increased hunger or thirst; increased urination signs and symptoms of liver injury like dark yellow or brown urine; general ill feeling or flu-like symptoms; light-colored stools; loss of appetite; nausea; right upper belly pain; unusually weak or tired; yellowing of the eyes or skin stomach pain trouble passing urine or change in the amount of urine weight gain or weight loss Side effects that usually do not require medical attention (report these toyour doctor or health care professional if they continue or are bothersome): bone pain constipation loss of appetite muscle pain nausea swelling of the ankles, feet, hands tiredness This list may not describe all possible side effects. Call your doctor for medical advice about side effects. You may report side effects to FDA at1-800-FDA-1088. Where should I keep my medication? This  drug is given in a hospital or clinic and will not be stored at home. NOTE: This sheet is a summary. It may not cover all possible information. If you have questions about this medicine, talk to your doctor, pharmacist, orhealth care provider.  2022 Elsevier/Gold Standard (2019-11-29 13:01:29)

## 2021-03-25 NOTE — Assessment & Plan Note (Addendum)
#   T2N3- Stage III-non-small cell lung cancer-[favor adenocarcinoma]- s/p  concurrent chemoradiation; Finished Feb 2022]; currently on adjuvant durvalumab.  However durvalumab currently on hold because of recent cough/suspicion for pneumonitis; CT June 2022-stable left lower lobe malignancy/radiation changes.  However patchy groundglass opacities bilateral lung [see below].  # proceed with IMFINZI; Labs today reviewed;  acceptable for treatment today.   #Bilateral groundglass nodular/consolidative-suspect pneumonia vs inflammation from durvalumab [patient s/p prednisone & Z-Pak]- monitor closely while on therapy; will plan repeat in 2-3 moths  #History of A. fib on Eliquis- STABLE.     # DISPOSITION:  # proceed with  IMFINZI today; # follow up in 2 weeks-MD; labs- cbc/cmp; -;IMFINZI-Dr.B

## 2021-03-25 NOTE — Progress Notes (Signed)
[facility] NOTE  Patient Care Team: Baxter Hire, MD as PCP - General (Internal Medicine) [name], RN as Oncology Nurse Navigator [name], MD as Consulting Physician (Hematology and Oncology)  CHIEF COMPLAINTS/PURPOSE OF CONSULTATION: Lung cancer  #  Oncology History Overview Note  # NOV 2021- LEFT LOWER LOBE NON-SMALL CELL CA [favor adeno ca]; T2N3 [right hilar; subcarinal LN;[name]; NOV 2021-MRI Brain-NEG  # 11/30- carbo-Taxol-RT [RT until 10/23/20]   # A.fib [Eliquis; Dr.Kowalski]  # # SURVIVORSHIP:   # GENETICS:   #NGS-ordered  DIAGNOSIS: Lung cancer  STAGE:   III      ;  GOALS:  cure  CURRENT/MOST RECENT THERAPY : carbo-Taxol-RT ]   Cancer of lower lobe of left lung ([place])  08/14/2020 Initial Diagnosis   Cancer of lower lobe of left lung ([place])   09/02/2020 - 10/22/2020 Chemotherapy    Patient is on Treatment Plan: LUNG DURVALUMAB Q14D       10/16/2020 Cancer Staging   Staging form: Lung, AJCC 8th Edition - Clinical: Stage IIIB (cT2a, cN3, cM0) - Signed by [name], MD on 10/16/2020    12/17/2020 -  Chemotherapy    Patient is on Treatment Plan: LUNG DURVALUMAB Q14D        HISTORY OF PRESENTING ILLNESS:  Jason Warren 87 y.o.  male patient with stage III lung non-small cell lung-currently adjuvant duvalumab.   Patient sent durvalumab has been held for the last 6 weeks because of cough/CT scan changes concerning for atypical infection.  Patient is currently status post prednisone.  Also s/p Z-Pak.  Cough resolved.  No difficulty swallowing.  No headaches.  No nausea or vomiting.  No fever no chills.  Review of Systems  Constitutional:  Negative for chills, diaphoresis, fever, malaise/fatigue and weight loss.  HENT:  Negative for nosebleeds and sore throat.   Eyes:  Negative for double vision.  Respiratory:  Negative for hemoptysis, sputum production, shortness of breath and wheezing.    Cardiovascular:  Negative for chest pain, palpitations, orthopnea and leg swelling.  Gastrointestinal:  Negative for abdominal pain, blood in stool, constipation, diarrhea, heartburn, melena, nausea and vomiting.  Genitourinary:  Negative for dysuria, frequency and urgency.  Musculoskeletal:  Negative for back pain and joint pain.  Skin:  Negative for itching.  Neurological:  Negative for tingling, focal weakness, weakness and headaches.  Endo/Heme/Allergies:  Does not bruise/bleed easily.  Psychiatric/Behavioral:  Negative for depression. The patient is not nervous/anxious and does not have insomnia.     MEDICAL HISTORY:  Past Medical History:  Diagnosis Date   Arthritis    Dysrhythmia    A-fib   Hip pain    left   HOH (hard of hearing)    LBBB (left bundle branch block) 08/05/2020   Lung cancer ([place] [facility])    Skin cancer, basal cell     face top of head    SURGICAL HISTORY: Past Surgical History:  Procedure Laterality Date   CATARACT EXTRACTION W/PHACO Right 07/13/2017   Procedure: CATARACT EXTRACTION PHACO AND INTRAOCULAR LENS PLACEMENT ([place]);  Surgeon: Leandrew Koyanagi, MD;  Location: [facility];  Service: Ophthalmology;  Laterality: Right;  IVA TOPICAL RIGHT   CATARACT EXTRACTION W/PHACO Left 01/04/2018   Procedure: CATARACT EXTRACTION PHACO AND INTRAOCULAR LENS PLACEMENT ([place]) LEFT;  Surgeon: Leandrew Koyanagi, MD;  Location: [facility];  Service: Ophthalmology;  Laterality: Left;   COLONOSCOPY     IR IMAGING GUIDED PORT INSERTION  08/27/2020   JOINT  REPLACEMENT     Left total hip Dr. Su Hoff 08-04-18   ROTATOR CUFF REPAIR Right    SKIN CANCER EXCISION     face   TOTAL HIP ARTHROPLASTY Left 08/04/2018   Procedure: TOTAL HIP ARTHROPLASTY ANTERIOR APPROACH;  Surgeon: Frederik Pear, MD;  Location: [facility];  Service: Orthopedics;  Laterality: Left;   VIDEO BRONCHOSCOPY WITH ENDOBRONCHIAL NAVIGATION N/A 08/07/2020   Procedure: VIDEO BRONCHOSCOPY WITH  ENDOBRONCHIAL NAVIGATION;  Surgeon: Ottie Glazier, MD;  Location: [facility];  Service: Thoracic;  Laterality: N/A;   VIDEO BRONCHOSCOPY WITH ENDOBRONCHIAL ULTRASOUND N/A 08/07/2020   Procedure: VIDEO BRONCHOSCOPY WITH ENDOBRONCHIAL ULTRASOUND;  Surgeon: Ottie Glazier, MD;  Location: [facility];  Service: Thoracic;  Laterality: N/A;    SOCIAL HISTORY: Social History   Socioeconomic History   Marital status: Married    Spouse name: Not on file   Number of children: Not on file   Years of education: Not on file   Highest education level: Not on file  Occupational History   Not on file  Tobacco Use   Smoking status: Former    Packs/day: 1.00    Years: 4.00    Pack years: 4.00    Types: Cigarettes   Smokeless tobacco: Never   Tobacco comments:    quit early 70's  Vaping Use   Vaping Use: Never used  Substance and Sexual Activity   Alcohol use: No   Drug use: No   Sexual activity: Not Currently  Other Topics Concern   Not on file  Social History Narrative   > quit 35 years; smoked for 15 years. Rare alcohol. 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    Social Determinants of Health   Financial Resource Strain: Not on file  Food Insecurity: Not on file  Transportation Needs: Not on file  Physical Activity: Not on file  Stress: Not on file  Social Connections: Not on file  Intimate Partner Violence: Not on file    FAMILY HISTORY: Family History  Problem Relation Age of Onset   Throat cancer Brother         & lung cancer    ALLERGIES:  has No Known Allergies.  MEDICATIONS:  Current Outpatient Medications  Medication Sig Dispense Refill   acetaminophen (TYLENOL) 500 MG tablet Take 500 mg by mouth every 8 (eight) hours as needed for moderate pain.     apixaban (ELIQUIS) 5 MG TABS tablet Take 5 mg by mouth 2 (two) times daily.     hydroxypropyl methylcellulose / hypromellose (ISOPTO TEARS / GONIOVISC) 2.5 %  ophthalmic solution Place 1 drop into the left eye daily as needed for dry eyes.     lidocaine-prilocaine (EMLA) cream Apply 1 application topically as needed. Apply small amount to port site at least 1 hour prior to it being accessed, cover with plastic wrap 30 g 1   Multiple Vitamin (MULTIVITAMIN) tablet Take 1 tablet by mouth daily.     Polyethyl Glycol-Propyl Glycol (SYSTANE FREE OP) Apply 1 drop to eye as needed.     pravastatin (PRAVACHOL) 80 MG tablet Take 80 mg by mouth at bedtime.      saw palmetto 160 MG capsule Take 160 mg by mouth 2 (two) times daily.     sucralfate (CARAFATE) 1 g tablet Take 1 tablet (1 g total) by mouth 4 (four) times daily -  with meals and at bedtime. 90 tablet 1  No current facility-administered medications for this visit.   Facility-Administered Medications Ordered in Other Visits  Medication Dose Route Frequency Provider Last Rate Last Admin   durvalumab (IMFINZI) 740 mg in sodium chloride 0.9 % 100 mL chemo infusion  10 mg/kg (Order-Specific) Intravenous Once [name], MD 115 mL/hr at 03/25/21 0954 740 mg at 03/25/21 0954   heparin lock flush 100 unit/mL  500 Units Intracatheter Once PRN Charlaine Dalton R, MD       sodium chloride flush (NS) 0.9 % injection 10 mL  10 mL Intravenous PRN [name], MD   10 mL at 02/11/21 0835   PHYSICAL EXAMINATION: ECOG PERFORMANCE STATUS: 1 - Symptomatic but completely ambulatory  Vitals:   03/25/21 0844  BP: 135/67  Pulse: 70  Resp: 18  Temp: (!) 96.7 F (35.9 C)  SpO2: 100%   Filed Weights   03/25/21 0844  Weight: 167 lb 1.6 oz (75.8 kg)    Physical Exam Constitutional:      Comments: Walking independently but with a limp because of left hip arthritis.  He is alone.  HENT:     Head: Normocephalic and atraumatic.     Mouth/Throat:     Pharynx: No oropharyngeal exudate.  Eyes:     Pupils: Pupils are equal, round, and reactive to light.  Cardiovascular:     Rate and Rhythm:  Normal rate and regular rhythm.  Pulmonary:     Effort: Pulmonary effort is normal. No respiratory distress.     Breath sounds: Normal breath sounds. No wheezing.  Abdominal:     General: Bowel sounds are normal. There is no distension.     Palpations: Abdomen is soft. There is no mass.     Tenderness: There is no abdominal tenderness. There is no guarding or rebound.  Musculoskeletal:        General: No tenderness. Normal range of motion.     Cervical back: Normal range of motion and neck supple.  Skin:    General: Skin is warm.  Neurological:     Mental Status: He is alert and oriented to person, place, and time.  Psychiatric:        Mood and Affect: Affect normal.     LABORATORY DATA:  I have reviewed the data as listed Lab Results  Component Value Date   WBC 4.8 03/25/2021   HGB 13.2 03/25/2021   HCT 39.2 03/25/2021   MCV 94.0 03/25/2021   PLT 216 03/25/2021   Recent Labs    02/25/21 0836 03/11/21 0917 03/25/21 0833  NA 134* 138 139  K 3.8 4.2 4.3  CL 98 105 106  CO2 24 23 25   GLUCOSE 143* 109* 84  BUN 23 19 16   CREATININE 1.05 0.93 0.95  CALCIUM 8.6* 8.7* 8.7*  GFRNONAA >60 >60 >60  PROT 7.1 6.7 6.8  ALBUMIN 3.7 3.7 3.6  AST 29 24 27   ALT 22 17 15   ALKPHOS 119 123 117  BILITOT 0.8 0.6 1.0    RADIOGRAPHIC STUDIES: I have personally reviewed the radiological images as listed and agreed with the findings in the report. CT Chest Wo Contrast  Result Date: 03/07/2021 CLINICAL DATA:  Restaging non-small cell lung cancer EXAM: CT CHEST WITHOUT CONTRAST TECHNIQUE: Multidetector CT imaging of the chest was performed following the standard protocol without IV contrast. COMPARISON:  11/26/2020 FINDINGS: Cardiovascular: Right Port-A-Cath tip: Cavoatrial junction. Coronary, aortic arch, and branch vessel atherosclerotic vascular disease. Mediastinum/Nodes: Scattered small mediastinal lymph nodes are again observed.  Potential right eccentric subcarinal node versus small  pericardial fluid collection in the subcarinal region measuring 1.4 cm in short axis on image 93 series 2, formerly 1.0 cm. There also some areas of mild indistinct stranding in the mediastinal adipose tissue for example along the left paratracheal region on image 64 series 2 similar to previous. Lungs/Pleura: Small left pleural effusion increased from prior and potentially with loculated components. Bilateral pleural calcifications. Similar region of cylindrical and cystic bronchiectasis in the superior segment right lower lobe with associated surrounding consolidation. New patchy peripheral ground-glass and airspace opacities in both lungs are observed, raising suspicion for multilobar infection, although some of the regions of prior infection have improved. In the area of the prior left lower lobe cavitary lesion, there is substantial new volume loss and consolidation which is possibly therapy related. Progressive consolidation and volume loss in the left paramediastinal region which is likely therapy related. The anterior right upper lobe ground-glass opacities shown on the prior exam have resolved. Upper Abdomen: Stable hypodense lesion in the lateral segment left hepatic lobe, probably a cyst, image 139 series 2. Contracted gallbladder. Abdominal aortic atherosclerotic calcification. Musculoskeletal: Stable L1 compression fracture. IMPRESSION: 1. Likely therapy related consolidation and volume loss in the left lower lobe in the vicinity of the prior cavitary spiculated lesion. 2. Stable appearance of consolidation and bronchiectasis in the superior segment right lower lobe may also be therapy related. 3. Patchy new areas of ground-glass, consolidative, and nodular opacities in the lungs in a pattern favoring multilobar infection/pneumonia. Surveillance is recommended to exclude malignant component. 4. Small but increased loculated left pleural effusion. 5. Other imaging findings of potential clinical  significance: Aortic Atherosclerosis (ICD10-I70.0). Coronary atherosclerosis. Chronic bilateral pleural calcifications. Stable L1 compression fracture. Electronically Signed   By: Van Clines M.D.   On: 03/07/2021 12:13     ASSESSMENT & PLAN:   Cancer of lower lobe of left lung ([facility]) # T2N3- Stage III-non-small cell lung cancer-[favor adenocarcinoma]- s/p  concurrent chemoradiation; Finished Feb 2022]; currently on adjuvant durvalumab.  However durvalumab currently on hold because of recent cough/suspicion for pneumonitis; CT June 2022-stable left lower lobe malignancy/radiation changes.  However patchy groundglass opacities bilateral lung [see below].  # proceed with IMFINZI; Labs today reviewed;  acceptable for treatment today.   #Bilateral groundglass nodular/consolidative-suspect pneumonia vs inflammation from durvalumab [patient s/p prednisone & Z-Pak]- monitor closely while on therapy; will plan repeat in 2-3 moths  #History of A. fib on Eliquis- STABLE.     # DISPOSITION:  # proceed with  IMFINZI today; # follow up in 2 weeks-MD; labs- cbc/cmp; -;IMFINZI-Dr.B  All questions were answered. The patient knows to call the clinic with any problems, questions or concerns.   [name], MD 03/25/2021 10:44 AM

## 2021-04-08 ENCOUNTER — Ambulatory Visit: Payer: Medicare Other | Admitting: Oncology

## 2021-04-08 ENCOUNTER — Ambulatory Visit: Payer: Medicare Other

## 2021-04-08 ENCOUNTER — Other Ambulatory Visit: Payer: Medicare Other

## 2021-04-09 ENCOUNTER — Encounter: Payer: Self-pay | Admitting: Internal Medicine

## 2021-04-09 ENCOUNTER — Inpatient Hospital Stay: Payer: Medicare Other | Attending: Internal Medicine

## 2021-04-09 ENCOUNTER — Inpatient Hospital Stay (HOSPITAL_BASED_OUTPATIENT_CLINIC_OR_DEPARTMENT_OTHER): Payer: Medicare Other | Admitting: Internal Medicine

## 2021-04-09 ENCOUNTER — Inpatient Hospital Stay: Payer: Medicare Other

## 2021-04-09 DIAGNOSIS — C3432 Malignant neoplasm of lower lobe, left bronchus or lung: Secondary | ICD-10-CM | POA: Insufficient documentation

## 2021-04-09 DIAGNOSIS — Z7901 Long term (current) use of anticoagulants: Secondary | ICD-10-CM | POA: Diagnosis not present

## 2021-04-09 DIAGNOSIS — Z95828 Presence of other vascular implants and grafts: Secondary | ICD-10-CM

## 2021-04-09 DIAGNOSIS — Z452 Encounter for adjustment and management of vascular access device: Secondary | ICD-10-CM | POA: Diagnosis not present

## 2021-04-09 DIAGNOSIS — Z5112 Encounter for antineoplastic immunotherapy: Secondary | ICD-10-CM | POA: Diagnosis not present

## 2021-04-09 DIAGNOSIS — I4891 Unspecified atrial fibrillation: Secondary | ICD-10-CM | POA: Insufficient documentation

## 2021-04-09 LAB — COMPREHENSIVE METABOLIC PANEL
ALT: 16 U/L (ref 0–44)
AST: 28 U/L (ref 15–41)
Albumin: 3.8 g/dL (ref 3.5–5.0)
Alkaline Phosphatase: 108 U/L (ref 38–126)
Anion gap: 9 (ref 5–15)
BUN: 18 mg/dL (ref 8–23)
CO2: 24 mmol/L (ref 22–32)
Calcium: 8.8 mg/dL — ABNORMAL LOW (ref 8.9–10.3)
Chloride: 102 mmol/L (ref 98–111)
Creatinine, Ser: 0.89 mg/dL (ref 0.61–1.24)
GFR, Estimated: >60 mL/min (ref >60)
Glucose, Bld: 159 mg/dL — ABNORMAL HIGH (ref 70–99)
Potassium: 4.4 mmol/L (ref 3.5–5.1)
Sodium: 135 mmol/L (ref 135–145)
Total Bilirubin: 1.2 mg/dL (ref 0.3–1.2)
Total Protein: 6.9 g/dL (ref 6.5–8.1)

## 2021-04-09 LAB — CBC WITH DIFFERENTIAL/PLATELET
Abs Immature Granulocytes: 0 10*3/uL (ref 0.00–0.07)
Basophils Absolute: 0 10*3/uL (ref 0.0–0.1)
Basophils Relative: 1 %
Eosinophils Absolute: 0.3 10*3/uL (ref 0.0–0.5)
Eosinophils Relative: 6 %
HCT: 39.6 % (ref 39.0–52.0)
Hemoglobin: 13.3 g/dL (ref 13.0–17.0)
Immature Granulocytes: 0 %
Lymphocytes Relative: 15 %
Lymphs Abs: 0.8 10*3/uL (ref 0.7–4.0)
MCH: 32.5 pg (ref 26.0–34.0)
MCHC: 33.6 g/dL (ref 30.0–36.0)
MCV: 96.8 fL (ref 80.0–100.0)
Monocytes Absolute: 0.7 10*3/uL (ref 0.1–1.0)
Monocytes Relative: 13 %
Neutro Abs: 3.5 10*3/uL (ref 1.7–7.7)
Neutrophils Relative %: 65 %
Platelets: 255 10*3/uL (ref 150–400)
RBC: 4.09 MIL/uL — ABNORMAL LOW (ref 4.22–5.81)
RDW: 14.6 % (ref 11.5–15.5)
WBC: 5.3 10*3/uL (ref 4.0–10.5)
nRBC: 0 % (ref 0.0–0.2)

## 2021-04-09 MED ORDER — HEPARIN SOD (PORK) LOCK FLUSH 100 UNIT/ML IV SOLN
500.0000 [IU] | Freq: Once | INTRAVENOUS | Status: AC
Start: 2021-04-09 — End: 2021-04-09
  Administered 2021-04-09: 500 [IU] via INTRAVENOUS
  Filled 2021-04-09: qty 5

## 2021-04-09 MED ORDER — SODIUM CHLORIDE 0.9 % IV SOLN
Freq: Once | INTRAVENOUS | Status: AC
  Filled 2021-04-09: qty 250

## 2021-04-09 MED ORDER — HEPARIN SOD (PORK) LOCK FLUSH 100 UNIT/ML IV SOLN
INTRAVENOUS | Status: AC
  Filled 2021-04-09: qty 5

## 2021-04-09 MED ORDER — SODIUM CHLORIDE 0.9 % IV SOLN
10.0000 mg/kg | Freq: Once | INTRAVENOUS | Status: AC
  Administered 2021-04-09: 740 mg via INTRAVENOUS
  Filled 2021-04-09: qty 10

## 2021-04-09 MED ORDER — SODIUM CHLORIDE 0.9% FLUSH
10.0000 mL | Freq: Once | INTRAVENOUS | Status: AC
  Administered 2021-04-09: 10 mL via INTRAVENOUS
  Filled 2021-04-09: qty 10

## 2021-04-09 NOTE — Patient Instructions (Signed)
[facility]   Discharge Instructions: Thank you for choosing [facility] to provide your oncology and hematology care.  If you have a lab appointment with the [facility], please go directly to the [facility] and check in at the registration area.  Wear comfortable clothing and clothing appropriate for easy access to any Portacath or PICC line.   We strive to give you quality time with your provider. You may need to reschedule your appointment if you arrive late (15 or more minutes).  Arriving late affects you and other patients whose appointments are after yours.  Also, if you miss three or more appointments without notifying the office, you may be dismissed from the clinic at the provider's discretion.      For prescription refill requests, have your pharmacy contact our office and allow 72 hours for refills to be completed.    Today you received the following chemotherapy and/or immunotherapy agents: Imfinzi.      To help prevent nausea and vomiting after your treatment, we encourage you to take your nausea medication as directed.  BELOW ARE SYMPTOMS THAT SHOULD BE REPORTED IMMEDIATELY: *FEVER GREATER THAN 100.4 F (38 C) OR HIGHER *CHILLS OR SWEATING *NAUSEA AND VOMITING THAT IS NOT CONTROLLED WITH YOUR NAUSEA MEDICATION *UNUSUAL SHORTNESS OF BREATH *UNUSUAL BRUISING OR BLEEDING *URINARY PROBLEMS (pain or burning when urinating, or frequent urination) *BOWEL PROBLEMS (unusual diarrhea, constipation, pain near the anus) TENDERNESS IN MOUTH AND THROAT WITH OR WITHOUT PRESENCE OF ULCERS (sore throat, sores in mouth, or a toothache) UNUSUAL RASH, SWELLING OR PAIN  UNUSUAL VAGINAL DISCHARGE OR ITCHING   Items with * indicate a potential emergency and should be followed up as soon as possible or go to the Emergency Department if any problems should occur.  Please show the CHEMOTHERAPY ALERT CARD or IMMUNOTHERAPY ALERT CARD at check-in  to the Emergency Department and triage nurse.  Should you have questions after your visit or need to cancel or reschedule your appointment, please contact [facility]  [phone] and follow the prompts.  Office hours are 8:00 a.m. to 4:30 p.m. Monday - Friday. Please note that voicemails left after 4:00 p.m. may not be returned until the following business day.  We are closed weekends and major holidays. You have access to a nurse at all times for urgent questions. Please call the main number to the clinic [phone] and follow the prompts.  For any non-urgent questions, you may also contact your provider using MyChart. We now offer e-Visits for anyone 10 and older to request care online for non-urgent symptoms. For details visit [email or web address].   Also download the MyChart app! Go to the app store, search "MyChart", open the app, select [facility], and log in with your MyChart username and password.  Due to Covid, a mask is required upon entering the hospital/clinic. If you do not have a mask, one will be given to you upon arrival. For doctor visits, patients may have 1 support person aged 41 or older with them. For treatment visits, patients cannot have anyone with them due to current Covid guidelines and our immunocompromised population.

## 2021-04-09 NOTE — Assessment & Plan Note (Addendum)
#   T2N3- Stage III-non-small cell lung cancer-[favor adenocarcinoma]- s/p  concurrent chemoradiation; Finished Feb 2022]; currently on adjuvant durvalumab. Re-started back [after holding x4 w]. CT June 2022-stable left lower lobe malignancy/radiation changes.  However patchy groundglass opacities bilateral lung [see below].  # proceed with IMFINZI; Labs today reviewed;  acceptable for treatment today. MAY 2022-TSH-WNL.   #Bilateral groundglass nodular/consolidative-suspect pneumonia vs inflammation from durvalumab [patient s/p prednisone & Z-Pak]-STABLE- monitor closely while on therapy; will plan repeat in 2-3 months  #History of A. fib on Eliquis- STABLE.   # DISPOSITION:  # proceed with  IMFINZI today; # follow up in 2 weeks-MD; labs- cbc/cmp; -;IMFINZI-Dr.B

## 2021-04-09 NOTE — Progress Notes (Signed)
[place] NOTE  Patient Care Team: Baxter Hire, MD as PCP - General (Internal Medicine) [name], RN as Oncology Nurse Navigator [name], MD as Consulting Physician (Hematology and Oncology)  CHIEF COMPLAINTS/PURPOSE OF CONSULTATION: Lung cancer  #  Oncology History Overview Note  # NOV 2021- LEFT LOWER LOBE NON-SMALL CELL CA [favor adeno ca]; T2N3 [right hilar; subcarinal LN;[name]; NOV 2021-MRI Brain-NEG  # 11/30- carbo-Taxol-RT [RT until 10/23/20]   # A.fib [Eliquis; Dr.Kowalski]  # # SURVIVORSHIP:   # GENETICS:   #NGS-ordered  DIAGNOSIS: Lung cancer  STAGE:   III      ;  GOALS:  cure  CURRENT/MOST RECENT THERAPY : carbo-Taxol-RT ]   Cancer of lower lobe of left lung ([place])  08/14/2020 Initial Diagnosis   Cancer of lower lobe of left lung ([place])   09/02/2020 - 10/22/2020 Chemotherapy    Patient is on Treatment Plan: LUNG DURVALUMAB Q14D       10/16/2020 Cancer Staging   Staging form: Lung, AJCC 8th Edition - Clinical: Stage IIIB (cT2a, cN3, cM0) - Signed by [name], MD on 10/16/2020    12/17/2020 -  Chemotherapy    Patient is on Treatment Plan: LUNG DURVALUMAB Q14D        HISTORY OF PRESENTING ILLNESS:  Jason Warren 87 y.o.  male patient with stage III lung non-small cell lung-currently adjuvant duvalumab.   Patient denies any nausea vomiting headache or cough.  No diarrhea.  No swelling the legs.  Review of Systems  Constitutional:  Negative for chills, diaphoresis, fever, malaise/fatigue and weight loss.  HENT:  Negative for nosebleeds and sore throat.   Eyes:  Negative for double vision.  Respiratory:  Negative for hemoptysis, sputum production, shortness of breath and wheezing.   Cardiovascular:  Negative for chest pain, palpitations, orthopnea and leg swelling.  Gastrointestinal:  Negative for abdominal pain, blood in stool, constipation, diarrhea, heartburn, melena, nausea and  vomiting.  Genitourinary:  Negative for dysuria, frequency and urgency.  Musculoskeletal:  Negative for back pain and joint pain.  Skin:  Negative for itching.  Neurological:  Negative for tingling, focal weakness, weakness and headaches.  Endo/Heme/Allergies:  Does not bruise/bleed easily.  Psychiatric/Behavioral:  Negative for depression. The patient is not nervous/anxious and does not have insomnia.     MEDICAL HISTORY:  Past Medical History:  Diagnosis Date   Arthritis    Dysrhythmia    A-fib   Hip pain    left   HOH (hard of hearing)    LBBB (left bundle branch block) 08/05/2020   Lung cancer ([place])    Skin cancer, basal cell     face top of head    SURGICAL HISTORY: Past Surgical History:  Procedure Laterality Date   CATARACT EXTRACTION W/PHACO Right 07/13/2017   Procedure: CATARACT EXTRACTION PHACO AND INTRAOCULAR LENS PLACEMENT ([place]);  Surgeon: Leandrew Koyanagi, MD;  Location: [facility];  Service: Ophthalmology;  Laterality: Right;  IVA TOPICAL RIGHT   CATARACT EXTRACTION W/PHACO Left 01/04/2018   Procedure: CATARACT EXTRACTION PHACO AND INTRAOCULAR LENS PLACEMENT ([place]) LEFT;  Surgeon: Leandrew Koyanagi, MD;  Location: [facility];  Service: Ophthalmology;  Laterality: Left;   COLONOSCOPY     IR IMAGING GUIDED PORT INSERTION  08/27/2020   JOINT REPLACEMENT     Left total hip Dr. Su Hoff 08-04-18   ROTATOR CUFF REPAIR Right    SKIN CANCER EXCISION     face   TOTAL HIP ARTHROPLASTY Left  08/04/2018   Procedure: TOTAL HIP ARTHROPLASTY ANTERIOR APPROACH;  Surgeon: Frederik Pear, MD;  Location[facility];  Service: Orthopedics;  Laterality: Left;   VIDEO BRONCHOSCOPY WITH ENDOBRONCHIAL NAVIGATION N/A 08/07/2020   Procedure: VIDEO BRONCHOSCOPY WITH ENDOBRONCHIAL NAVIGATION;  Surgeon: Ottie Glazier, MD;  Location: [facility];  Service: Thoracic;  Laterality: N/A;   VIDEO BRONCHOSCOPY WITH ENDOBRONCHIAL ULTRASOUND N/A 08/07/2020   Procedure: VIDEO  BRONCHOSCOPY WITH ENDOBRONCHIAL ULTRASOUND;  Surgeon: Ottie Glazier, MD;  Location: [facility];  Service: Thoracic;  Laterality: N/A;    SOCIAL HISTORY: Social History   Socioeconomic History   Marital status: Married    Spouse name: Not on file   Number of children: Not on file   Years of education: Not on file   Highest education level: Not on file  Occupational History   Not on file  Tobacco Use   Smoking status: Former    Packs/day: 1.00    Years: 4.00    Pack years: 4.00    Types: Cigarettes   Smokeless tobacco: Never   Tobacco comments:    quit early 70's  Vaping Use   Vaping Use: Never used  Substance and Sexual Activity   Alcohol use: No   Drug use: No   Sexual activity: Not Currently  Other Topics Concern   Not on file  Social History Narrative   > quit 35 years; smoked for 15 years. Rare alcohol. 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    Social Determinants of Health   Financial Resource Strain: Not on file  Food Insecurity: Not on file  Transportation Needs: Not on file  Physical Activity: Not on file  Stress: Not on file  Social Connections: Not on file  Intimate Partner Violence: Not on file    FAMILY HISTORY: Family History  Problem Relation Age of Onset   Throat cancer Brother         & lung cancer    ALLERGIES:  has No Known Allergies.  MEDICATIONS:  Current Outpatient Medications  Medication Sig Dispense Refill   acetaminophen (TYLENOL) 500 MG tablet Take 500 mg by mouth every 8 (eight) hours as needed for moderate pain.     apixaban (ELIQUIS) 5 MG TABS tablet Take 5 mg by mouth 2 (two) times daily.     hydroxypropyl methylcellulose / hypromellose (ISOPTO TEARS / GONIOVISC) 2.5 % ophthalmic solution Place 1 drop into the left eye daily as needed for dry eyes.     lidocaine-prilocaine (EMLA) cream Apply 1 application topically as needed. Apply small amount to port site at least  1 hour prior to it being accessed, cover with plastic wrap 30 g 1   Multiple Vitamin (MULTIVITAMIN) tablet Take 1 tablet by mouth daily.     Polyethyl Glycol-Propyl Glycol (SYSTANE FREE OP) Apply 1 drop to eye as needed.     pravastatin (PRAVACHOL) 80 MG tablet Take 80 mg by mouth at bedtime.      saw palmetto 160 MG capsule Take 160 mg by mouth 2 (two) times daily.     sucralfate (CARAFATE) 1 g tablet Take 1 tablet (1 g total) by mouth 4 (four) times daily -  with meals and at bedtime. 90 tablet 1   No current facility-administered medications for this visit.   Facility-Administered Medications Ordered in Other Visits  Medication Dose Route Frequency Provider Last Rate Last Admin   sodium chloride flush (NS) 0.9 % injection 10  mL  10 mL Intravenous PRN [name], MD   10 mL at 02/11/21 0835   PHYSICAL EXAMINATION: ECOG PERFORMANCE STATUS: 1 - Symptomatic but completely ambulatory  Vitals:   04/09/21 0832  BP: 117/78  Pulse: 69  Resp: 16  Temp: (!) 97.2 F (36.2 C)  SpO2: 99%   Filed Weights   04/09/21 0832  Weight: 165 lb (74.8 kg)    Physical Exam Constitutional:      Comments: Walking independently but with a limp because of left hip arthritis.  He is alone.  HENT:     Head: Normocephalic and atraumatic.     Mouth/Throat:     Pharynx: No oropharyngeal exudate.  Eyes:     Pupils: Pupils are equal, round, and reactive to light.  Cardiovascular:     Rate and Rhythm: Normal rate and regular rhythm.  Pulmonary:     Effort: Pulmonary effort is normal. No respiratory distress.     Breath sounds: Normal breath sounds. No wheezing.  Abdominal:     General: Bowel sounds are normal. There is no distension.     Palpations: Abdomen is soft. There is no mass.     Tenderness: There is no abdominal tenderness. There is no guarding or rebound.  Musculoskeletal:        General: No tenderness. Normal range of motion.     Cervical back: Normal range of motion and neck  supple.  Skin:    General: Skin is warm.  Neurological:     Mental Status: He is alert and oriented to person, place, and time.  Psychiatric:        Mood and Affect: Affect normal.     LABORATORY DATA:  I have reviewed the data as listed Lab Results  Component Value Date   WBC 5.3 04/09/2021   HGB 13.3 04/09/2021   HCT 39.6 04/09/2021   MCV 96.8 04/09/2021   PLT 255 04/09/2021   Recent Labs    03/11/21 0917 03/25/21 0833 04/09/21 0805  NA 138 139 135  K 4.2 4.3 4.4  CL 105 106 102  CO2 23 25 24   GLUCOSE 109* 84 159*  BUN 19 16 18   CREATININE 0.93 0.95 0.89  CALCIUM 8.7* 8.7* 8.8*  GFRNONAA >60 >60 >60  PROT 6.7 6.8 6.9  ALBUMIN 3.7 3.6 3.8  AST 24 27 28   ALT 17 15 16   ALKPHOS 123 117 108  BILITOT 0.6 1.0 1.2    RADIOGRAPHIC STUDIES: I have personally reviewed the radiological images as listed and agreed with the findings in the report. No results found.   ASSESSMENT & PLAN:   Cancer of lower lobe of left lung ([place]) # T2N3- Stage III-non-small cell lung cancer-[favor adenocarcinoma]- s/p  concurrent chemoradiation; Finished Feb 2022]; currently on adjuvant durvalumab. Re-started back [after holding x4 w]. CT June 2022-stable left lower lobe malignancy/radiation changes.  However patchy groundglass opacities bilateral lung [see below].  # proceed with IMFINZI; Labs today reviewed;  acceptable for treatment today. MAY 2022-TSH-WNL.   #Bilateral groundglass nodular/consolidative-suspect pneumonia vs inflammation from durvalumab [patient s/p prednisone & Z-Pak]-STABLE- monitor closely while on therapy; will plan repeat in 2-3 months  #History of A. fib on Eliquis- STABLE.   # DISPOSITION:  # proceed with  IMFINZI today; # follow up in 2 weeks-MD; labs- cbc/cmp; -;IMFINZI-Dr.B  All questions were answered. The patient knows to call the clinic with any problems, questions or concerns.   [name], MD 04/12/2021 9:51 PM

## 2021-04-10 ENCOUNTER — Encounter: Payer: Self-pay | Admitting: Radiation Oncology

## 2021-04-10 ENCOUNTER — Ambulatory Visit
Admission: RE | Admit: 2021-04-10 | Discharge: 2021-04-10 | Disposition: A | Discharge status: Home / Self Care | Payer: Medicare Other | Source: Ambulatory Visit | Attending: Radiation Oncology | Admitting: Radiation Oncology

## 2021-04-10 DIAGNOSIS — C3432 Malignant neoplasm of lower lobe, left bronchus or lung: Secondary | ICD-10-CM | POA: Diagnosis present

## 2021-04-10 DIAGNOSIS — Z923 Personal history of irradiation: Secondary | ICD-10-CM | POA: Diagnosis not present

## 2021-04-10 NOTE — Progress Notes (Signed)
Radiation Oncology Follow up Note  Name: Jason Warren   Date:   04/10/2021 MRN:  [id number] DOB: [birth year]    This 87 y.o. male presents to the clinic today for 77-month follow-up status post concurrent chemoradiation therapy for stage IIIb adenocarcinoma the left lung.  REFERRING PROVIDER: Baxter Hire, MD  HPI: Patient is an 87 year old male now out 4 months having completed concurrent chemoradiation therapy for stage IIIb adenocarcinoma left lung seen today in routine follow-up he is doing well.  He specifically denies any dysphagia cough hemoptysis or chest tightness.Marland Kitchen  His most recent CT scan showed therapy related consolidation and volume loss in the left lower lobe in the region of the prior cavitary spiculated lesion.  Does have some areas of groundglass consolidative nodular opacities in the lungs favoring multilobar infection or pneumonia.  He is currently on immunotherapy with IMFINZI which she is tolerating well. COMPLICATIONS OF TREATMENT: none  FOLLOW UP COMPLIANCE: keeps appointments   PHYSICAL EXAM:  BP (P) 129/66 (BP Location: Left Arm, Patient Position: Sitting)   Pulse (!) (P) 57   Resp (P) 16   Wt (P) 166 lb 6.4 oz (75.5 kg)   BMI (P) 22.57 kg/m  Well-developed well-nourished patient in NAD. HEENT reveals PERLA, EOMI, discs not visualized.  Oral cavity is clear. No oral mucosal lesions are identified. Neck is clear without evidence of cervical or supraclavicular adenopathy. Lungs are clear to A&P. Cardiac examination is essentially unremarkable with regular rate and rhythm without murmur rub or thrill. Abdomen is benign with no organomegaly or masses noted. Motor sensory and DTR levels are equal and symmetric in the upper and lower extremities. Cranial nerves II through XII are grossly intact. Proprioception is intact. No peripheral adenopathy or edema is identified. No motor or sensory levels are noted. Crude visual fields are within normal range.  RADIOLOGY RESULTS:  CT scans reviewed compatible with above-stated findings  PLAN: Present time patient is doing well stable CT findings showing response to therapy.  He continues on immunotherapy under medical oncology's direction.  I have asked to see him back in 6 months for follow-up.  Patient knows to call with any concerns.  I would like to take this opportunity to thank you for allowing me to participate in the care of your patient.Noreene Filbert, MD

## 2021-04-12 ENCOUNTER — Encounter: Payer: Self-pay | Admitting: Internal Medicine

## 2021-04-23 ENCOUNTER — Inpatient Hospital Stay (HOSPITAL_BASED_OUTPATIENT_CLINIC_OR_DEPARTMENT_OTHER): Payer: Medicare Other | Admitting: Internal Medicine

## 2021-04-23 ENCOUNTER — Inpatient Hospital Stay: Payer: Medicare Other

## 2021-04-23 ENCOUNTER — Encounter: Payer: Self-pay | Admitting: Internal Medicine

## 2021-04-23 ENCOUNTER — Other Ambulatory Visit: Payer: Self-pay

## 2021-04-23 DIAGNOSIS — C3432 Malignant neoplasm of lower lobe, left bronchus or lung: Secondary | ICD-10-CM | POA: Diagnosis not present

## 2021-04-23 DIAGNOSIS — Z5112 Encounter for antineoplastic immunotherapy: Secondary | ICD-10-CM | POA: Diagnosis not present

## 2021-04-23 LAB — CBC WITH DIFFERENTIAL/PLATELET
Abs Immature Granulocytes: 0.01 10*3/uL (ref 0.00–0.07)
Basophils Absolute: 0 10*3/uL (ref 0.0–0.1)
Basophils Relative: 0 %
Eosinophils Absolute: 0.2 10*3/uL (ref 0.0–0.5)
Eosinophils Relative: 4 %
HCT: 39.8 % (ref 39.0–52.0)
Hemoglobin: 13.1 g/dL (ref 13.0–17.0)
Immature Granulocytes: 0 %
Lymphocytes Relative: 17 %
Lymphs Abs: 0.7 10*3/uL (ref 0.7–4.0)
MCH: 31.7 pg (ref 26.0–34.0)
MCHC: 32.9 g/dL (ref 30.0–36.0)
MCV: 96.4 fL (ref 80.0–100.0)
Monocytes Absolute: 0.7 10*3/uL (ref 0.1–1.0)
Monocytes Relative: 16 %
Neutro Abs: 2.6 10*3/uL (ref 1.7–7.7)
Neutrophils Relative %: 63 %
Platelets: 227 10*3/uL (ref 150–400)
RBC: 4.13 MIL/uL — ABNORMAL LOW (ref 4.22–5.81)
RDW: 14.5 % (ref 11.5–15.5)
WBC: 4.2 10*3/uL (ref 4.0–10.5)
nRBC: 0 % (ref 0.0–0.2)

## 2021-04-23 LAB — COMPREHENSIVE METABOLIC PANEL
ALT: 14 U/L (ref 0–44)
AST: 28 U/L (ref 15–41)
Albumin: 3.7 g/dL (ref 3.5–5.0)
Alkaline Phosphatase: 108 U/L (ref 38–126)
Anion gap: 9 (ref 5–15)
BUN: 17 mg/dL (ref 8–23)
CO2: 24 mmol/L (ref 22–32)
Calcium: 8.8 mg/dL — ABNORMAL LOW (ref 8.9–10.3)
Chloride: 104 mmol/L (ref 98–111)
Creatinine, Ser: 0.93 mg/dL (ref 0.61–1.24)
GFR, Estimated: >60 mL/min (ref >60)
Glucose, Bld: 133 mg/dL — ABNORMAL HIGH (ref 70–99)
Potassium: 4.5 mmol/L (ref 3.5–5.1)
Sodium: 137 mmol/L (ref 135–145)
Total Bilirubin: 0.7 mg/dL (ref 0.3–1.2)
Total Protein: 6.8 g/dL (ref 6.5–8.1)

## 2021-04-23 MED ORDER — HEPARIN SOD (PORK) LOCK FLUSH 100 UNIT/ML IV SOLN
500.0000 [IU] | Freq: Once | INTRAVENOUS | Status: DC | PRN
  Filled 2021-04-23: qty 5

## 2021-04-23 MED ORDER — HEPARIN SOD (PORK) LOCK FLUSH 100 UNIT/ML IV SOLN
INTRAVENOUS | Status: AC
  Filled 2021-04-23: qty 5

## 2021-04-23 MED ORDER — SODIUM CHLORIDE 0.9 % IV SOLN
Freq: Once | INTRAVENOUS | Status: AC
  Filled 2021-04-23: qty 250

## 2021-04-23 MED ORDER — SODIUM CHLORIDE 0.9 % IV SOLN
10.0000 mg/kg | Freq: Once | INTRAVENOUS | Status: AC
  Administered 2021-04-23: 740 mg via INTRAVENOUS
  Filled 2021-04-23: qty 4.8

## 2021-04-23 MED ORDER — SODIUM CHLORIDE 0.9% FLUSH
10.0000 mL | INTRAVENOUS | Status: DC | PRN
  Administered 2021-04-23: 10 mL via INTRAVENOUS
  Filled 2021-04-23: qty 10

## 2021-04-23 MED ORDER — HEPARIN SOD (PORK) LOCK FLUSH 100 UNIT/ML IV SOLN
500.0000 [IU] | Freq: Once | INTRAVENOUS | Status: AC
  Administered 2021-04-23: 500 [IU] via INTRAVENOUS
  Filled 2021-04-23: qty 5

## 2021-04-23 NOTE — Assessment & Plan Note (Addendum)
#   T2N3- Stage III-non-small cell lung cancer-[favor adenocarcinoma]- s/p  concurrent chemoradiation; Finished Feb 2022]; currently on adjuvant durvalumab. Re-started back [after holding x4 w]. CT June 2022-stable left lower lobe malignancy/radiation changes.  However patchy groundglass opacities bilateral lung [see below]. STABLE.   # proceed with IMFINZI; Labs today reviewed;  acceptable for treatment today. MAY 2022-TSH-WNL.   #Bilateral groundglass nodular/consolidative-suspect pneumonia vs inflammation from durvalumab [patient s/p prednisone & Z-Pak]-STABLE- monitor closely while on therapy; will repeat imaging in August September 2022.   #History of A. fib on Eliquis- STABLE.    # DISPOSITION:  # proceed with  IMFINZI today; # follow up in 2 weeks-MD; labs- cbc/cmp; -;IMFINZI-Dr.B

## 2021-04-23 NOTE — Progress Notes (Signed)
[place] NOTE  Patient Care Team: Baxter Hire, MD as PCP - General (Internal Medicine) [name], RN as Oncology Nurse Navigator [name], MD as Consulting Physician (Hematology and Oncology)  CHIEF COMPLAINTS/PURPOSE OF CONSULTATION: Lung cancer  #  Oncology History Overview Note  # NOV 2021- LEFT LOWER LOBE NON-SMALL CELL CA [favor adeno ca]; T2N3 [right hilar; subcarinal LN;[name]; NOV 2021-MRI Brain-NEG  # 11/30- carbo-Taxol-RT [RT until 10/23/20]   # A.fib [Eliquis; Dr.Kowalski]  # # SURVIVORSHIP:   # GENETICS:   #NGS-ordered  DIAGNOSIS: Lung cancer  STAGE:   III      ;  GOALS:  cure  CURRENT/MOST RECENT THERAPY : carbo-Taxol-RT ]   Cancer of lower lobe of left lung ([place])  08/14/2020 Initial Diagnosis   Cancer of lower lobe of left lung ([place])   09/02/2020 - 10/22/2020 Chemotherapy    Patient is on Treatment Plan: LUNG DURVALUMAB Q14D       10/16/2020 Cancer Staging   Staging form: Lung, AJCC 8th Edition - Clinical: Stage IIIB (cT2a, cN3, cM0) - Signed by [name], MD on 10/16/2020    12/17/2020 -  Chemotherapy    Patient is on Treatment Plan: LUNG DURVALUMAB Q14D        HISTORY OF PRESENTING ILLNESS:  Jason Warren 87 y.o.  male patient with stage III lung non-small cell lung-currently adjuvant duvalumab.   Patient denies any new shortness of breath or cough.  No diarrhea.  No swelling in the legs.  No difficulty swallowing.  No worsening joint pains.  No headaches.   Review of Systems  Constitutional:  Negative for chills, diaphoresis, fever, malaise/fatigue and weight loss.  HENT:  Negative for nosebleeds and sore throat.   Eyes:  Negative for double vision.  Respiratory:  Negative for hemoptysis, sputum production, shortness of breath and wheezing.   Cardiovascular:  Negative for chest pain, palpitations, orthopnea and leg swelling.  Gastrointestinal:  Negative for abdominal pain,  blood in stool, constipation, diarrhea, heartburn, melena, nausea and vomiting.  Genitourinary:  Negative for dysuria, frequency and urgency.  Musculoskeletal:  Negative for back pain and joint pain.  Skin:  Negative for itching.  Neurological:  Negative for tingling, focal weakness, weakness and headaches.  Endo/Heme/Allergies:  Does not bruise/bleed easily.  Psychiatric/Behavioral:  Negative for depression. The patient is not nervous/anxious and does not have insomnia.     MEDICAL HISTORY:  Past Medical History:  Diagnosis Date   Arthritis    Dysrhythmia    A-fib   Hip pain    left   HOH (hard of hearing)    LBBB (left bundle branch block) 08/05/2020   Lung cancer ([place])    Skin cancer, basal cell     face top of head    SURGICAL HISTORY: Past Surgical History:  Procedure Laterality Date   CATARACT EXTRACTION W/PHACO Right 07/13/2017   Procedure: CATARACT EXTRACTION PHACO AND INTRAOCULAR LENS PLACEMENT ([place]);  Surgeon: Leandrew Koyanagi, MD;  Location: [facility];  Service: Ophthalmology;  Laterality: Right;  IVA TOPICAL RIGHT   CATARACT EXTRACTION W/PHACO Left 01/04/2018   Procedure: CATARACT EXTRACTION PHACO AND INTRAOCULAR LENS PLACEMENT ([place]) LEFT;  Surgeon: Leandrew Koyanagi, MD;  Location: [facility];  Service: Ophthalmology;  Laterality: Left;   COLONOSCOPY     IR IMAGING GUIDED PORT INSERTION  08/27/2020   JOINT REPLACEMENT     Left total hip Dr. Su Hoff 08-04-18   ROTATOR CUFF REPAIR Right  SKIN CANCER EXCISION     face   TOTAL HIP ARTHROPLASTY Left 08/04/2018   Procedure: TOTAL HIP ARTHROPLASTY ANTERIOR APPROACH;  Surgeon: Frederik Pear, MD;  [facility];  Service: Orthopedics;  Laterality: Left;   VIDEO BRONCHOSCOPY WITH ENDOBRONCHIAL NAVIGATION N/A 08/07/2020   Procedure: VIDEO BRONCHOSCOPY WITH ENDOBRONCHIAL NAVIGATION;  Surgeon: Ottie Glazier, MD;  Location: [facility];  Service: Thoracic;  Laterality: N/A;   VIDEO BRONCHOSCOPY  WITH ENDOBRONCHIAL ULTRASOUND N/A 08/07/2020   Procedure: VIDEO BRONCHOSCOPY WITH ENDOBRONCHIAL ULTRASOUND;  Surgeon: Ottie Glazier, MD;  Location: [facility];  Service: Thoracic;  Laterality: N/A;    SOCIAL HISTORY: Social History   Socioeconomic History   Marital status: Married    Spouse name: Not on file   Number of children: Not on file   Years of education: Not on file   Highest education level: Not on file  Occupational History   Not on file  Tobacco Use   Smoking status: Former    Packs/day: 1.00    Years: 4.00    Pack years: 4.00    Types: Cigarettes   Smokeless tobacco: Never   Tobacco comments:    quit early 70's  Vaping Use   Vaping Use: Never used  Substance and Sexual Activity   Alcohol use: No   Drug use: No   Sexual activity: Not Currently  Other Topics Concern   Not on file  Social History Narrative   > quit 35 years; smoked for 15 years. Rare alcohol. 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    Social Determinants of Health   Financial Resource Strain: Not on file  Food Insecurity: Not on file  Transportation Needs: Not on file  Physical Activity: Not on file  Stress: Not on file  Social Connections: Not on file  Intimate Partner Violence: Not on file    FAMILY HISTORY: Family History  Problem Relation Age of Onset   Throat cancer Brother         & lung cancer    ALLERGIES:  has No Known Allergies.  MEDICATIONS:  Current Outpatient Medications  Medication Sig Dispense Refill   acetaminophen (TYLENOL) 500 MG tablet Take 500 mg by mouth every 8 (eight) hours as needed for moderate pain.     apixaban (ELIQUIS) 5 MG TABS tablet Take 5 mg by mouth 2 (two) times daily.     hydroxypropyl methylcellulose / hypromellose (ISOPTO TEARS / GONIOVISC) 2.5 % ophthalmic solution Place 1 drop into the left eye daily as needed for dry eyes.     lidocaine-prilocaine (EMLA) cream Apply 1  application topically as needed. Apply small amount to port site at least 1 hour prior to it being accessed, cover with plastic wrap 30 g 1   Multiple Vitamin (MULTIVITAMIN) tablet Take 1 tablet by mouth daily.     Polyethyl Glycol-Propyl Glycol (SYSTANE FREE OP) Apply 1 drop to eye as needed.     pravastatin (PRAVACHOL) 80 MG tablet Take 80 mg by mouth at bedtime.      saw palmetto 160 MG capsule Take 160 mg by mouth 2 (two) times daily.     sucralfate (CARAFATE) 1 g tablet Take 1 tablet (1 g total) by mouth 4 (four) times daily -  with meals and at bedtime. 90 tablet 1   No current facility-administered medications for this visit.   Facility-Administered Medications Ordered in Other Visits  Medication Dose Route Frequency Provider  Last Rate Last Admin   sodium chloride flush (NS) 0.9 % injection 10 mL  10 mL Intravenous PRN [name], MD   10 mL at 02/11/21 0835   PHYSICAL EXAMINATION: ECOG PERFORMANCE STATUS: 1 - Symptomatic but completely ambulatory  Vitals:   04/23/21 0838  BP: 126/72  Pulse: 67  Resp: 16  Temp: (!) 97 F (36.1 C)  SpO2: 98%   Filed Weights   04/23/21 0838  Weight: 166 lb (75.3 kg)    Physical Exam Constitutional:      Comments: Walking independently but with a limp because of left hip arthritis.  He is alone.  HENT:     Head: Normocephalic and atraumatic.     Mouth/Throat:     Pharynx: No oropharyngeal exudate.  Eyes:     Pupils: Pupils are equal, round, and reactive to light.  Cardiovascular:     Rate and Rhythm: Normal rate and regular rhythm.  Pulmonary:     Effort: Pulmonary effort is normal. No respiratory distress.     Breath sounds: Normal breath sounds. No wheezing.  Abdominal:     General: Bowel sounds are normal. There is no distension.     Palpations: Abdomen is soft. There is no mass.     Tenderness: There is no abdominal tenderness. There is no guarding or rebound.  Musculoskeletal:        General: No tenderness. Normal  range of motion.     Cervical back: Normal range of motion and neck supple.  Skin:    General: Skin is warm.  Neurological:     Mental Status: He is alert and oriented to person, place, and time.  Psychiatric:        Mood and Affect: Affect normal.     LABORATORY DATA:  I have reviewed the data as listed Lab Results  Component Value Date   WBC 4.2 04/23/2021   HGB 13.1 04/23/2021   HCT 39.8 04/23/2021   MCV 96.4 04/23/2021   PLT 227 04/23/2021   Recent Labs    03/25/21 0833 04/09/21 0805 04/23/21 0811  NA 139 135 137  K 4.3 4.4 4.5  CL 106 102 104  CO2 25 24 24   GLUCOSE 84 159* 133*  BUN 16 18 17   CREATININE 0.95 0.89 0.93  CALCIUM 8.7* 8.8* 8.8*  GFRNONAA >60 >60 >60  PROT 6.8 6.9 6.8  ALBUMIN 3.6 3.8 3.7  AST 27 28 28   ALT 15 16 14   ALKPHOS 117 108 108  BILITOT 1.0 1.2 0.7    RADIOGRAPHIC STUDIES: I have personally reviewed the radiological images as listed and agreed with the findings in the report. No results found.   ASSESSMENT & PLAN:   Cancer of lower lobe of left lung ([place]) # T2N3- Stage III-non-small cell lung cancer-[favor adenocarcinoma]- s/p  concurrent chemoradiation; Finished Feb 2022]; currently on adjuvant durvalumab. Re-started back [after holding x4 w]. CT June 2022-stable left lower lobe malignancy/radiation changes.  However patchy groundglass opacities bilateral lung [see below]. STABLE.   # proceed with IMFINZI; Labs today reviewed;  acceptable for treatment today. MAY 2022-TSH-WNL.   #Bilateral groundglass nodular/consolidative-suspect pneumonia vs inflammation from durvalumab [patient s/p prednisone & Z-Pak]-STABLE- monitor closely while on therapy; will repeat imaging in August September 2022.   #History of A. fib on Eliquis- STABLE.    # DISPOSITION:  # proceed with  IMFINZI today; # follow up in 2 weeks-MD; labs- cbc/cmp; -;IMFINZI-Dr.B  All questions were answered. The patient knows to call  the clinic with any problems,  questions or concerns.   [name], MD 04/26/2021 5:11 PM

## 2021-04-23 NOTE — Patient Instructions (Signed)
[facility] ONCOLOGY  Discharge Instructions: Thank you for choosing [facility] to provide your oncology and hematology care.  If you have a lab appointment with the [facility], please go directly to the [facility] and check in at the registration area.  Wear comfortable clothing and clothing appropriate for easy access to any Portacath or PICC line.   We strive to give you quality time with your provider. You may need to reschedule your appointment if you arrive late (15 or more minutes).  Arriving late affects you and other patients whose appointments are after yours.  Also, if you miss three or more appointments without notifying the office, you may be dismissed from the clinic at the provider's discretion.      For prescription refill requests, have your pharmacy contact our office and allow 72 hours for refills to be completed.    Today you received the following chemotherapy and/or immunotherapy agents : Imfinzi    To help prevent nausea and vomiting after your treatment, we encourage you to take your nausea medication as directed.  BELOW ARE SYMPTOMS THAT SHOULD BE REPORTED IMMEDIATELY: *FEVER GREATER THAN 100.4 F (38 C) OR HIGHER *CHILLS OR SWEATING *NAUSEA AND VOMITING THAT IS NOT CONTROLLED WITH YOUR NAUSEA MEDICATION *UNUSUAL SHORTNESS OF BREATH *UNUSUAL BRUISING OR BLEEDING *URINARY PROBLEMS (pain or burning when urinating, or frequent urination) *BOWEL PROBLEMS (unusual diarrhea, constipation, pain near the anus) TENDERNESS IN MOUTH AND THROAT WITH OR WITHOUT PRESENCE OF ULCERS (sore throat, sores in mouth, or a toothache) UNUSUAL RASH, SWELLING OR PAIN  UNUSUAL VAGINAL DISCHARGE OR ITCHING   Items with * indicate a potential emergency and should be followed up as soon as possible or go to the Emergency Department if any problems should occur.  Please show the CHEMOTHERAPY ALERT CARD or IMMUNOTHERAPY ALERT CARD at check-in to  the Emergency Department and triage nurse.  Should you have questions after your visit or need to cancel or reschedule your appointment, please contact [facility]  [phone] and follow the prompts.  Office hours are 8:00 a.m. to 4:30 p.m. Monday - Friday. Please note that voicemails left after 4:00 p.m. may not be returned until the following business day.  We are closed weekends and major holidays. You have access to a nurse at all times for urgent questions. Please call the main number to the clinic [phone] and follow the prompts.  For any non-urgent questions, you may also contact your provider using MyChart. We now offer e-Visits for anyone 17 and older to request care online for non-urgent symptoms. For details visit [email or web address].   Also download the MyChart app! Go to the app store, search "MyChart", open the app, select [facility], and log in with your MyChart username and password.  Due to Covid, a mask is required upon entering the hospital/clinic. If you do not have a mask, one will be given to you upon arrival. For doctor visits, patients may have 1 support person aged 54 or older with them. For treatment visits, patients cannot have anyone with them due to current Covid guidelines and our immunocompromised population.

## 2021-04-26 ENCOUNTER — Encounter: Payer: Self-pay | Admitting: Internal Medicine

## 2021-05-07 ENCOUNTER — Other Ambulatory Visit: Payer: Self-pay

## 2021-05-07 ENCOUNTER — Inpatient Hospital Stay: Payer: Medicare Other

## 2021-05-07 ENCOUNTER — Other Ambulatory Visit: Payer: Self-pay | Admitting: *Deleted

## 2021-05-07 ENCOUNTER — Inpatient Hospital Stay (HOSPITAL_BASED_OUTPATIENT_CLINIC_OR_DEPARTMENT_OTHER): Payer: Medicare Other | Admitting: Internal Medicine

## 2021-05-07 ENCOUNTER — Inpatient Hospital Stay: Payer: Medicare Other | Attending: Internal Medicine

## 2021-05-07 ENCOUNTER — Encounter: Payer: Self-pay | Admitting: Internal Medicine

## 2021-05-07 DIAGNOSIS — R5383 Other fatigue: Secondary | ICD-10-CM

## 2021-05-07 DIAGNOSIS — Z5112 Encounter for antineoplastic immunotherapy: Secondary | ICD-10-CM | POA: Diagnosis present

## 2021-05-07 DIAGNOSIS — C3432 Malignant neoplasm of lower lobe, left bronchus or lung: Secondary | ICD-10-CM | POA: Diagnosis present

## 2021-05-07 DIAGNOSIS — J90 Pleural effusion, not elsewhere classified: Secondary | ICD-10-CM | POA: Insufficient documentation

## 2021-05-07 DIAGNOSIS — Z79899 Other long term (current) drug therapy: Secondary | ICD-10-CM | POA: Insufficient documentation

## 2021-05-07 DIAGNOSIS — Z7901 Long term (current) use of anticoagulants: Secondary | ICD-10-CM | POA: Diagnosis not present

## 2021-05-07 DIAGNOSIS — I4891 Unspecified atrial fibrillation: Secondary | ICD-10-CM | POA: Diagnosis not present

## 2021-05-07 LAB — COMPREHENSIVE METABOLIC PANEL
ALT: 16 U/L (ref 0–44)
AST: 27 U/L (ref 15–41)
Albumin: 3.8 g/dL (ref 3.5–5.0)
Alkaline Phosphatase: 117 U/L (ref 38–126)
Anion gap: 8 (ref 5–15)
BUN: 20 mg/dL (ref 8–23)
CO2: 25 mmol/L (ref 22–32)
Calcium: 8.9 mg/dL (ref 8.9–10.3)
Chloride: 104 mmol/L (ref 98–111)
Creatinine, Ser: 1.04 mg/dL (ref 0.61–1.24)
GFR, Estimated: >60 mL/min (ref >60)
Glucose, Bld: 120 mg/dL — ABNORMAL HIGH (ref 70–99)
Potassium: 4.3 mmol/L (ref 3.5–5.1)
Sodium: 137 mmol/L (ref 135–145)
Total Bilirubin: 1 mg/dL (ref 0.3–1.2)
Total Protein: 6.8 g/dL (ref 6.5–8.1)

## 2021-05-07 LAB — CBC WITH DIFFERENTIAL/PLATELET
Abs Immature Granulocytes: 0.01 10*3/uL (ref 0.00–0.07)
Basophils Absolute: 0 10*3/uL (ref 0.0–0.1)
Basophils Relative: 1 %
Eosinophils Absolute: 0.3 10*3/uL (ref 0.0–0.5)
Eosinophils Relative: 5 %
HCT: 39.8 % (ref 39.0–52.0)
Hemoglobin: 13.3 g/dL (ref 13.0–17.0)
Immature Granulocytes: 0 %
Lymphocytes Relative: 16 %
Lymphs Abs: 0.8 10*3/uL (ref 0.7–4.0)
MCH: 32.4 pg (ref 26.0–34.0)
MCHC: 33.4 g/dL (ref 30.0–36.0)
MCV: 97.1 fL (ref 80.0–100.0)
Monocytes Absolute: 0.6 10*3/uL (ref 0.1–1.0)
Monocytes Relative: 11 %
Neutro Abs: 3.6 10*3/uL (ref 1.7–7.7)
Neutrophils Relative %: 67 %
Platelets: 243 10*3/uL (ref 150–400)
RBC: 4.1 MIL/uL — ABNORMAL LOW (ref 4.22–5.81)
RDW: 13.9 % (ref 11.5–15.5)
WBC: 5.2 10*3/uL (ref 4.0–10.5)
nRBC: 0 % (ref 0.0–0.2)

## 2021-05-07 LAB — TSH: TSH: 3.636 u[IU]/mL (ref 0.350–4.500)

## 2021-05-07 MED ORDER — HEPARIN SOD (PORK) LOCK FLUSH 100 UNIT/ML IV SOLN
500.0000 [IU] | Freq: Once | INTRAVENOUS | Status: AC | PRN
  Administered 2021-05-07: 500 [IU]
  Filled 2021-05-07: qty 5

## 2021-05-07 MED ORDER — HEPARIN SOD (PORK) LOCK FLUSH 100 UNIT/ML IV SOLN
INTRAVENOUS | Status: AC
  Filled 2021-05-07: qty 5

## 2021-05-07 MED ORDER — SODIUM CHLORIDE 0.9 % IV SOLN
10.0000 mg/kg | Freq: Once | INTRAVENOUS | Status: AC
  Administered 2021-05-07: 740 mg via INTRAVENOUS
  Filled 2021-05-07: qty 10

## 2021-05-07 MED ORDER — SODIUM CHLORIDE 0.9% FLUSH
10.0000 mL | Freq: Once | INTRAVENOUS | Status: AC
  Administered 2021-05-07: 10 mL via INTRAVENOUS
  Filled 2021-05-07: qty 10

## 2021-05-07 MED ORDER — SODIUM CHLORIDE 0.9 % IV SOLN
Freq: Once | INTRAVENOUS | Status: AC
  Filled 2021-05-07: qty 250

## 2021-05-07 NOTE — Patient Instructions (Signed)
[facility]  Discharge Instructions: Thank you for choosing [facility] to provide your oncology and hematology care.  If you have a lab appointment with the [facility], please go directly to the [facility] and check in at the registration area.  Wear comfortable clothing and clothing appropriate for easy access to any Portacath or PICC line.   We strive to give you quality time with your provider. You may need to reschedule your appointment if you arrive late (15 or more minutes).  Arriving late affects you and other patients whose appointments are after yours.  Also, if you miss three or more appointments without notifying the office, you may be dismissed from the clinic at the provider's discretion.      For prescription refill requests, have your pharmacy contact our office and allow 72 hours for refills to be completed.    Today you received the following chemotherapy and/or immunotherapy agents Imfiniz     To help prevent nausea and vomiting after your treatment, we encourage you to take your nausea medication as directed.  BELOW ARE SYMPTOMS THAT SHOULD BE REPORTED IMMEDIATELY: *FEVER GREATER THAN 100.4 F (38 C) OR HIGHER *CHILLS OR SWEATING *NAUSEA AND VOMITING THAT IS NOT CONTROLLED WITH YOUR NAUSEA MEDICATION *UNUSUAL SHORTNESS OF BREATH *UNUSUAL BRUISING OR BLEEDING *URINARY PROBLEMS (pain or burning when urinating, or frequent urination) *BOWEL PROBLEMS (unusual diarrhea, constipation, pain near the anus) TENDERNESS IN MOUTH AND THROAT WITH OR WITHOUT PRESENCE OF ULCERS (sore throat, sores in mouth, or a toothache) UNUSUAL RASH, SWELLING OR PAIN  UNUSUAL VAGINAL DISCHARGE OR ITCHING   Items with * indicate a potential emergency and should be followed up as soon as possible or go to the Emergency Department if any problems should occur.  Please show the CHEMOTHERAPY ALERT CARD or IMMUNOTHERAPY ALERT CARD at check-in to  the Emergency Department and triage nurse.  Should you have questions after your visit or need to cancel or reschedule your appointment, please contact [facility]  [phone] and follow the prompts.  Office hours are 8:00 a.m. to 4:30 p.m. Monday - Friday. Please note that voicemails left after 4:00 p.m. may not be returned until the following business day.  We are closed weekends and major holidays. You have access to a nurse at all times for urgent questions. Please call the main number to the clinic [phone] and follow the prompts.  For any non-urgent questions, you may also contact your provider using MyChart. We now offer e-Visits for anyone 15 and older to request care online for non-urgent symptoms. For details visit [email or web address].   Also download the MyChart app! Go to the app store, search "MyChart", open the app, select [facility], and log in with your MyChart username and password.  Due to Covid, a mask is required upon entering the hospital/clinic. If you do not have a mask, one will be given to you upon arrival. For doctor visits, patients may have 1 support person aged 27 or older with them. For treatment visits, patients cannot have anyone with them due to current Covid guidelines and our immunocompromised population.

## 2021-05-07 NOTE — Progress Notes (Signed)
[facility] NOTE  Patient Care Team: Baxter Hire, MD as PCP - General (Internal Medicine) [name], RN as Oncology Nurse Navigator [name], MD as Consulting Physician (Hematology and Oncology)  CHIEF COMPLAINTS/PURPOSE OF CONSULTATION: Lung cancer  #  Oncology History Overview Note  # NOV 2021- LEFT LOWER LOBE NON-SMALL CELL CA [favor adeno ca]; T2N3 [right hilar; subcarinal LN;[name]; NOV 2021-MRI Brain-NEG  # 11/30- carbo-Taxol-RT [RT until 10/23/20]   # A.fib [Eliquis; Dr.Kowalski]  # # SURVIVORSHIP:   # GENETICS:   #NGS-ordered  DIAGNOSIS: Lung cancer  STAGE:   III      ;  GOALS:  cure  CURRENT/MOST RECENT THERAPY : carbo-Taxol-RT ]   Cancer of lower lobe of left lung ([name])  08/14/2020 Initial Diagnosis   Cancer of lower lobe of left lung ([name])   09/02/2020 - 10/22/2020 Chemotherapy    Patient is on Treatment Plan: LUNG DURVALUMAB Q14D       10/16/2020 Cancer Staging   Staging form: Lung, AJCC 8th Edition - Clinical: Stage IIIB (cT2a, cN3, cM0) - Signed by [name], MD on 10/16/2020    12/17/2020 -  Chemotherapy    Patient is on Treatment Plan: LUNG DURVALUMAB Q14D        HISTORY OF PRESENTING ILLNESS:  Jason [facility] 87 y.o.  male patient with stage III lung non-small cell lung-currently adjuvant duvalumab.   Patient denies any worsening nausea vomiting or shortness of breath or cough.  No difficulty swallowing.  Mild intermittent dryness in the mouth.    Review of Systems  Constitutional:  Negative for chills, diaphoresis, fever, malaise/fatigue and weight loss.  HENT:  Negative for nosebleeds and sore throat.   Eyes:  Negative for double vision.  Respiratory:  Negative for hemoptysis, sputum production, shortness of breath and wheezing.   Cardiovascular:  Negative for chest pain, palpitations, orthopnea and leg swelling.  Gastrointestinal:  Negative for abdominal pain, blood in stool,  constipation, diarrhea, heartburn, melena, nausea and vomiting.  Genitourinary:  Negative for dysuria, frequency and urgency.  Musculoskeletal:  Negative for back pain and joint pain.  Skin:  Negative for itching.  Neurological:  Negative for tingling, focal weakness, weakness and headaches.  Endo/Heme/Allergies:  Does not bruise/bleed easily.  Psychiatric/Behavioral:  Negative for depression. The patient is not nervous/anxious and does not have insomnia.     MEDICAL HISTORY:  Past Medical History:  Diagnosis Date   Arthritis    Dysrhythmia    A-fib   Hip pain    left   HOH (hard of hearing)    LBBB (left bundle branch block) 08/05/2020   Lung cancer ([place])    Skin cancer, basal cell     face top of head    SURGICAL HISTORY: Past Surgical History:  Procedure Laterality Date   CATARACT EXTRACTION W/PHACO Right 07/13/2017   Procedure: CATARACT EXTRACTION PHACO AND INTRAOCULAR LENS PLACEMENT ([place]);  Surgeon: Leandrew Koyanagi, MD;  Location: [facility];  Service: Ophthalmology;  Laterality: Right;  IVA TOPICAL RIGHT   CATARACT EXTRACTION W/PHACO Left 01/04/2018   Procedure: CATARACT EXTRACTION PHACO AND INTRAOCULAR LENS PLACEMENT ([place]) LEFT;  Surgeon: Leandrew Koyanagi, MD;  Location: [facility];  Service: Ophthalmology;  Laterality: Left;   COLONOSCOPY     IR IMAGING GUIDED PORT INSERTION  08/27/2020   JOINT REPLACEMENT     Left total hip Dr. Su Hoff 08-04-18   ROTATOR CUFF REPAIR Right    SKIN CANCER EXCISION  face   TOTAL HIP ARTHROPLASTY Left 08/04/2018   Procedure: TOTAL HIP ARTHROPLASTY ANTERIOR APPROACH;  Surgeon: Frederik Pear, MD;  Location: [facility];  Service: Orthopedics;  Laterality: Left;   VIDEO BRONCHOSCOPY WITH ENDOBRONCHIAL NAVIGATION N/A 08/07/2020   Procedure: VIDEO BRONCHOSCOPY WITH ENDOBRONCHIAL NAVIGATION;  Surgeon: Ottie Glazier, MD;  Location: [facility];  Service: Thoracic;  Laterality: N/A;   VIDEO BRONCHOSCOPY WITH ENDOBRONCHIAL  ULTRASOUND N/A 08/07/2020   Procedure: VIDEO BRONCHOSCOPY WITH ENDOBRONCHIAL ULTRASOUND;  Surgeon: Ottie Glazier, MD;  Location: [facility];  Service: Thoracic;  Laterality: N/A;    SOCIAL HISTORY: Social History   Socioeconomic History   Marital status: Married    Spouse name: Not on file   Number of children: Not on file   Years of education: Not on file   Highest education level: Not on file  Occupational History   Not on file  Tobacco Use   Smoking status: Former    Packs/day: 1.00    Years: 4.00    Pack years: 4.00    Types: Cigarettes   Smokeless tobacco: Never   Tobacco comments:    quit early 70's  Vaping Use   Vaping Use: Never used  Substance and Sexual Activity   Alcohol use: No   Drug use: No   Sexual activity: Not Currently  Other Topics Concern   Not on file  Social History Narrative   > quit 35 years; smoked for 15 years. Rare alcohol. 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    Social Determinants of Health   Financial Resource Strain: Not on file  Food Insecurity: Not on file  Transportation Needs: Not on file  Physical Activity: Not on file  Stress: Not on file  Social Connections: Not on file  Intimate Partner Violence: Not on file    FAMILY HISTORY: Family History  Problem Relation Age of Onset   Throat cancer Brother         & lung cancer    ALLERGIES:  has No Known Allergies.  MEDICATIONS:  Current Outpatient Medications  Medication Sig Dispense Refill   acetaminophen (TYLENOL) 500 MG tablet Take 500 mg by mouth every 8 (eight) hours as needed for moderate pain.     apixaban (ELIQUIS) 5 MG TABS tablet Take 5 mg by mouth 2 (two) times daily.     hydroxypropyl methylcellulose / hypromellose (ISOPTO TEARS / GONIOVISC) 2.5 % ophthalmic solution Place 1 drop into the left eye daily as needed for dry eyes.     lidocaine-prilocaine (EMLA) cream Apply 1 application topically as  needed. Apply small amount to port site at least 1 hour prior to it being accessed, cover with plastic wrap 30 g 1   Multiple Vitamin (MULTIVITAMIN) tablet Take 1 tablet by mouth daily.     Polyethyl Glycol-Propyl Glycol (SYSTANE FREE OP) Apply 1 drop to eye as needed.     pravastatin (PRAVACHOL) 80 MG tablet Take 80 mg by mouth at bedtime.      saw palmetto 160 MG capsule Take 160 mg by mouth 2 (two) times daily.     sucralfate (CARAFATE) 1 g tablet Take 1 tablet (1 g total) by mouth 4 (four) times daily -  with meals and at bedtime. 90 tablet 1   No current facility-administered medications for this visit.   Facility-Administered Medications Ordered in Other Visits  Medication Dose Route Frequency Provider Last Rate Last Admin   sodium  chloride flush (NS) 0.9 % injection 10 mL  10 mL Intravenous PRN [name], MD   10 mL at 02/11/21 0835   PHYSICAL EXAMINATION: ECOG PERFORMANCE STATUS: 1 - Symptomatic but completely ambulatory  Vitals:   05/07/21 0846  BP: 114/65  Pulse: 68  Resp: 16  Temp: (!) 96.2 F (35.7 C)  SpO2: 98%   Filed Weights   05/07/21 0846  Weight: 164 lb (74.4 kg)    Physical Exam Constitutional:      Comments: Walking independently but with a limp because of left hip arthritis.  He is alone.  HENT:     Head: Normocephalic and atraumatic.     Mouth/Throat:     Pharynx: No oropharyngeal exudate.  Eyes:     Pupils: Pupils are equal, round, and reactive to light.  Cardiovascular:     Rate and Rhythm: Normal rate and regular rhythm.  Pulmonary:     Effort: Pulmonary effort is normal. No respiratory distress.     Breath sounds: Normal breath sounds. No wheezing.  Abdominal:     General: Bowel sounds are normal. There is no distension.     Palpations: Abdomen is soft. There is no mass.     Tenderness: There is no abdominal tenderness. There is no guarding or rebound.  Musculoskeletal:        General: No tenderness. Normal range of motion.      Cervical back: Normal range of motion and neck supple.  Skin:    General: Skin is warm.  Neurological:     Mental Status: He is alert and oriented to person, place, and time.  Psychiatric:        Mood and Affect: Affect normal.     LABORATORY DATA:  I have reviewed the data as listed Lab Results  Component Value Date   WBC 5.2 05/07/2021   HGB 13.3 05/07/2021   HCT 39.8 05/07/2021   MCV 97.1 05/07/2021   PLT 243 05/07/2021   Recent Labs    04/09/21 0805 04/23/21 0811 05/07/21 0830  NA 135 137 137  K 4.4 4.5 4.3  CL 102 104 104  CO2 24 24 25   GLUCOSE 159* 133* 120*  BUN 18 17 20   CREATININE 0.89 0.93 1.04  CALCIUM 8.8* 8.8* 8.9  GFRNONAA >60 >60 >60  PROT 6.9 6.8 6.8  ALBUMIN 3.8 3.7 3.8  AST 28 28 27   ALT 16 14 16   ALKPHOS 108 108 117  BILITOT 1.2 0.7 1.0    RADIOGRAPHIC STUDIES: I have personally reviewed the radiological images as listed and agreed with the findings in the report. No results found.   ASSESSMENT & PLAN:   Cancer of lower lobe of left lung ([place]) # T2N3- Stage III-non-small cell lung cancer-[favor adenocarcinoma]- s/p  concurrent chemoradiation; Finished Feb 2022]; currently on adjuvant durvalumab. Re-started back [after holding x4 w]. CT June 2022-stable left lower lobe malignancy/radiation changes.  However patchy groundglass opacities bilateral lung [see below]. STABLE.   # proceed with IMFINZI; Labs today reviewed;  acceptable for treatment today. MAY 2022-TSH-WNL.   #Bilateral groundglass nodular/consolidative-suspect pneumonia vs inflammation from durvalumab [patient s/p prednisone & Z-Pak]-STABLE- monitor closely while on therapy; will repeat imaging in August September 2022.   #History of A. fib on Eliquis- STABLE.    # DISPOSITION:  # proceed with  IMFINZI today; # follow up in 2 weeks-MD; labs- cbc/cmp; -;IMFINZI-Dr.B  All questions were answered. The patient knows to call the clinic with any problems, questions or  concerns.    [name], MD 05/07/2021 9:13 AM

## 2021-05-07 NOTE — Assessment & Plan Note (Addendum)
#   T2N3- Stage III-non-small cell lung cancer-[favor adenocarcinoma]- s/p  concurrent chemoradiation; Finished Feb 2022]; currently on adjuvant durvalumab. Re-started back [after holding x4 w]. CT June 2022-stable left lower lobe malignancy/radiation changes.  However patchy groundglass opacities bilateral lung [see below]. STABLE.   # proceed with IMFINZI; Labs today reviewed;  acceptable for treatment today. MAY 2022-TSH-WNL.   #Bilateral groundglass nodular/consolidative-suspect pneumonia vs inflammation from durvalumab [patient s/p prednisone & Z-Pak]-STABLE- monitor closely while on therapy; will repeat imaging in August 2022.   #History of A. fib on Eliquis- STABLE.    # DISPOSITION:  # proceed with  IMFINZI today; # follow up in 2 weeks-MD; labs- cbc/cmp; -;IMFINZI; CT scan prior--Dr.B

## 2021-05-20 ENCOUNTER — Ambulatory Visit
Admission: RE | Admit: 2021-05-20 | Discharge: 2021-05-20 | Disposition: A | Discharge status: Home / Self Care | Payer: Medicare Other | Source: Ambulatory Visit | Attending: Internal Medicine | Admitting: Internal Medicine

## 2021-05-20 ENCOUNTER — Other Ambulatory Visit: Payer: Self-pay

## 2021-05-20 DIAGNOSIS — C3432 Malignant neoplasm of lower lobe, left bronchus or lung: Secondary | ICD-10-CM | POA: Insufficient documentation

## 2021-05-21 ENCOUNTER — Inpatient Hospital Stay (HOSPITAL_BASED_OUTPATIENT_CLINIC_OR_DEPARTMENT_OTHER): Payer: Medicare Other | Admitting: Internal Medicine

## 2021-05-21 ENCOUNTER — Inpatient Hospital Stay: Payer: Medicare Other

## 2021-05-21 VITALS — BP 126/65 | HR 59 | Temp 97.5°F | Resp 20 | Ht 72.0 in | Wt 162.4 lb

## 2021-05-21 DIAGNOSIS — R0602 Shortness of breath: Secondary | ICD-10-CM

## 2021-05-21 DIAGNOSIS — Z95828 Presence of other vascular implants and grafts: Secondary | ICD-10-CM

## 2021-05-21 DIAGNOSIS — I482 Chronic atrial fibrillation, unspecified: Secondary | ICD-10-CM

## 2021-05-21 DIAGNOSIS — Z9189 Other specified personal risk factors, not elsewhere classified: Secondary | ICD-10-CM

## 2021-05-21 DIAGNOSIS — R5383 Other fatigue: Secondary | ICD-10-CM

## 2021-05-21 DIAGNOSIS — C3432 Malignant neoplasm of lower lobe, left bronchus or lung: Secondary | ICD-10-CM

## 2021-05-21 DIAGNOSIS — Z5112 Encounter for antineoplastic immunotherapy: Secondary | ICD-10-CM | POA: Diagnosis not present

## 2021-05-21 LAB — CBC WITH DIFFERENTIAL/PLATELET
Abs Immature Granulocytes: 0.01 10*3/uL (ref 0.00–0.07)
Basophils Absolute: 0 10*3/uL (ref 0.0–0.1)
Basophils Relative: 0 %
Eosinophils Absolute: 0.3 10*3/uL (ref 0.0–0.5)
Eosinophils Relative: 5 %
HCT: 40.9 % (ref 39.0–52.0)
Hemoglobin: 13.5 g/dL (ref 13.0–17.0)
Immature Granulocytes: 0 %
Lymphocytes Relative: 23 %
Lymphs Abs: 1.1 10*3/uL (ref 0.7–4.0)
MCH: 32.1 pg (ref 26.0–34.0)
MCHC: 33 g/dL (ref 30.0–36.0)
MCV: 97.1 fL (ref 80.0–100.0)
Monocytes Absolute: 0.7 10*3/uL (ref 0.1–1.0)
Monocytes Relative: 13 %
Neutro Abs: 3 10*3/uL (ref 1.7–7.7)
Neutrophils Relative %: 59 %
Platelets: 223 10*3/uL (ref 150–400)
RBC: 4.21 MIL/uL — ABNORMAL LOW (ref 4.22–5.81)
RDW: 13.6 % (ref 11.5–15.5)
WBC: 5.1 10*3/uL (ref 4.0–10.5)
nRBC: 0 % (ref 0.0–0.2)

## 2021-05-21 LAB — COMPREHENSIVE METABOLIC PANEL
ALT: 16 U/L (ref 0–44)
AST: 27 U/L (ref 15–41)
Albumin: 3.9 g/dL (ref 3.5–5.0)
Alkaline Phosphatase: 112 U/L (ref 38–126)
Anion gap: 4 — ABNORMAL LOW (ref 5–15)
BUN: 17 mg/dL (ref 8–23)
CO2: 28 mmol/L (ref 22–32)
Calcium: 8.8 mg/dL — ABNORMAL LOW (ref 8.9–10.3)
Chloride: 104 mmol/L (ref 98–111)
Creatinine, Ser: 0.99 mg/dL (ref 0.61–1.24)
GFR, Estimated: >60 mL/min (ref >60)
Glucose, Bld: 96 mg/dL (ref 70–99)
Potassium: 4.3 mmol/L (ref 3.5–5.1)
Sodium: 136 mmol/L (ref 135–145)
Total Bilirubin: 1 mg/dL (ref 0.3–1.2)
Total Protein: 7.3 g/dL (ref 6.5–8.1)

## 2021-05-21 LAB — BRAIN NATRIURETIC PEPTIDE: B Natriuretic Peptide: 341.3 pg/mL — ABNORMAL HIGH (ref 0.0–100.0)

## 2021-05-21 MED ORDER — SODIUM CHLORIDE 0.9% FLUSH
10.0000 mL | Freq: Once | INTRAVENOUS | Status: AC
  Administered 2021-05-21: 10 mL via INTRAVENOUS
  Filled 2021-05-21: qty 10

## 2021-05-21 MED ORDER — HEPARIN SOD (PORK) LOCK FLUSH 100 UNIT/ML IV SOLN
500.0000 [IU] | Freq: Once | INTRAVENOUS | Status: DC
  Filled 2021-05-21: qty 5

## 2021-05-21 MED ORDER — HEPARIN SOD (PORK) LOCK FLUSH 100 UNIT/ML IV SOLN
INTRAVENOUS | Status: AC
  Filled 2021-05-21: qty 5

## 2021-05-21 MED ORDER — SODIUM CHLORIDE 0.9 % IV SOLN
Freq: Once | INTRAVENOUS | Status: AC
  Filled 2021-05-21: qty 250

## 2021-05-21 MED ORDER — SODIUM CHLORIDE 0.9 % IV SOLN
10.0000 mg/kg | Freq: Once | INTRAVENOUS | Status: AC
  Administered 2021-05-21: 740 mg via INTRAVENOUS
  Filled 2021-05-21: qty 10

## 2021-05-21 NOTE — Progress Notes (Signed)
[facility] NOTE  Patient Care Team: Baxter Hire, MD as PCP - General (Internal Medicine) [name], RN as Oncology Nurse Navigator [name], MD as Consulting Physician (Hematology and Oncology)  CHIEF COMPLAINTS/PURPOSE OF CONSULTATION: Lung cancer  #  Oncology History Overview Note  # NOV 2021- LEFT LOWER LOBE NON-SMALL CELL CA [favor adeno ca]; T2N3 [right hilar; subcarinal LN;[name]; NOV 2021-MRI Brain-NEG  # 11/30- carbo-Taxol-RT [RT until 10/23/20]   # A.fib [Eliquis; Dr.Kowalski]  # # SURVIVORSHIP:   # GENETICS:   #NGS-ordered  DIAGNOSIS: Lung cancer  STAGE:   III      ;  GOALS:  cure  CURRENT/MOST RECENT THERAPY : carbo-Taxol-RT ]   Cancer of lower lobe of left lung ([place])  08/14/2020 Initial Diagnosis   Cancer of lower lobe of left lung ([place])   09/02/2020 - 10/22/2020 Chemotherapy    Patient is on Treatment Plan: LUNG DURVALUMAB Q14D       10/16/2020 Cancer Staging   Staging form: Lung, AJCC 8th Edition - Clinical: Stage IIIB (cT2a, cN3, cM0) - Signed by [name], MD on 10/16/2020   12/17/2020 -  Chemotherapy    Patient is on Treatment Plan: LUNG DURVALUMAB Q14D        HISTORY OF PRESENTING ILLNESS:  Jason Warren 87 y.o.  male patient with stage III lung non-small cell lung-currently adjuvant duvalumab/review results of the interim scan.  Patient denies any worsening nausea vomiting or shortness of breath or cough.    Review of Systems  Constitutional:  Negative for chills, diaphoresis, fever, malaise/fatigue and weight loss.  HENT:  Negative for nosebleeds and sore throat.   Eyes:  Negative for double vision.  Respiratory:  Negative for hemoptysis, sputum production, shortness of breath and wheezing.   Cardiovascular:  Negative for chest pain, palpitations, orthopnea and leg swelling.  Gastrointestinal:  Negative for abdominal pain, blood in stool, constipation, diarrhea, heartburn,  melena, nausea and vomiting.  Genitourinary:  Negative for dysuria, frequency and urgency.  Musculoskeletal:  Negative for back pain and joint pain.  Skin:  Negative for itching.  Neurological:  Negative for tingling, focal weakness, weakness and headaches.  Endo/Heme/Allergies:  Does not bruise/bleed easily.  Psychiatric/Behavioral:  Negative for depression. The patient is not nervous/anxious and does not have insomnia.     MEDICAL HISTORY:  Past Medical History:  Diagnosis Date   Arthritis    Dysrhythmia    A-fib   Hip pain    left   HOH (hard of hearing)    LBBB (left bundle branch block) 08/05/2020   Lung cancer ([place])    Skin cancer, basal cell     face top of head    SURGICAL HISTORY: Past Surgical History:  Procedure Laterality Date   CATARACT EXTRACTION W/PHACO Right 07/13/2017   Procedure: CATARACT EXTRACTION PHACO AND INTRAOCULAR LENS PLACEMENT ([place]);  Surgeon: Leandrew Koyanagi, MD;  Location: [facility];  Service: Ophthalmology;  Laterality: Right;  IVA TOPICAL RIGHT   CATARACT EXTRACTION W/PHACO Left 01/04/2018   Procedure: CATARACT EXTRACTION PHACO AND INTRAOCULAR LENS PLACEMENT ([place]) LEFT;  Surgeon: Leandrew Koyanagi, MD;  Location: [facility];  Service: Ophthalmology;  Laterality: Left;   COLONOSCOPY     IR IMAGING GUIDED PORT INSERTION  08/27/2020   JOINT REPLACEMENT     Left total hip Dr. Su Hoff 08-04-18   ROTATOR CUFF REPAIR Right    SKIN CANCER EXCISION     face   TOTAL HIP ARTHROPLASTY  Left 08/04/2018   Procedure: TOTAL HIP ARTHROPLASTY ANTERIOR APPROACH;  Surgeon: Frederik Pear, MD;  Location: [facility];  Service: Orthopedics;  Laterality: Left;   VIDEO BRONCHOSCOPY WITH ENDOBRONCHIAL NAVIGATION N/A 08/07/2020   Procedure: VIDEO BRONCHOSCOPY WITH ENDOBRONCHIAL NAVIGATION;  Surgeon: Ottie Glazier, MD;  Location: [facility];  Service: Thoracic;  Laterality: N/A;   VIDEO BRONCHOSCOPY WITH ENDOBRONCHIAL ULTRASOUND N/A 08/07/2020    Procedure: VIDEO BRONCHOSCOPY WITH ENDOBRONCHIAL ULTRASOUND;  Surgeon: Ottie Glazier, MD;  Location: [facility];  Service: Thoracic;  Laterality: N/A;    SOCIAL HISTORY: Social History   Socioeconomic History   Marital status: Married    Spouse name: Not on file   Number of children: Not on file   Years of education: Not on file   Highest education level: Not on file  Occupational History   Not on file  Tobacco Use   Smoking status: Former    Packs/day: 1.00    Years: 4.00    Pack years: 4.00    Types: Cigarettes   Smokeless tobacco: Never   Tobacco comments:    quit early 70's  Vaping Use   Vaping Use: Never used  Substance and Sexual Activity   Alcohol use: No   Drug use: No   Sexual activity: Not Currently  Other Topics Concern   Not on file  Social History Narrative   > quit 35 years; smoked for 15 years. Rare alcohol. 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    Social Determinants of Health   Financial Resource Strain: Not on file  Food Insecurity: Not on file  Transportation Needs: Not on file  Physical Activity: Not on file  Stress: Not on file  Social Connections: Not on file  Intimate Partner Violence: Not on file    FAMILY HISTORY: Family History  Problem Relation Age of Onset   Throat cancer Brother         & lung cancer    ALLERGIES:  has No Known Allergies.  MEDICATIONS:  Current Outpatient Medications  Medication Sig Dispense Refill   apixaban (ELIQUIS) 5 MG TABS tablet Take 5 mg by mouth 2 (two) times daily.     hydroxypropyl methylcellulose / hypromellose (ISOPTO TEARS / GONIOVISC) 2.5 % ophthalmic solution Place 1 drop into the left eye daily as needed for dry eyes.     lidocaine-prilocaine (EMLA) cream Apply 1 application topically as needed. Apply small amount to port site at least 1 hour prior to it being accessed, cover with plastic wrap 30 g 1   Multiple Vitamin  (MULTIVITAMIN) tablet Take 1 tablet by mouth daily.     Polyethyl Glycol-Propyl Glycol (SYSTANE FREE OP) Apply 1 drop to eye as needed.     pravastatin (PRAVACHOL) 80 MG tablet Take 80 mg by mouth at bedtime.      saw palmetto 160 MG capsule Take 160 mg by mouth 2 (two) times daily.     acetaminophen (TYLENOL) 500 MG tablet Take 500 mg by mouth every 8 (eight) hours as needed for moderate pain. (Patient not taking: Reported on 05/21/2021)     No current facility-administered medications for this visit.   Facility-Administered Medications Ordered in Other Visits  Medication Dose Route Frequency Provider Last Rate Last Admin   sodium chloride flush (NS) 0.9 % injection 10 mL  10 mL Intravenous PRN [name], MD   10 mL at 02/11/21 0835   PHYSICAL EXAMINATION: ECOG  PERFORMANCE STATUS: 1 - Symptomatic but completely ambulatory  Vitals:   05/21/21 1002  BP: 126/65  Pulse: (!) 59  Resp: 20  Temp: (!) 97.5 F (36.4 C)   Filed Weights   05/21/21 1002  Weight: 162 lb 6.4 oz (73.7 kg)    Physical Exam Constitutional:      Comments: Walking independently but with a limp because of left hip arthritis.  He is alone.  HENT:     Head: Normocephalic and atraumatic.     Mouth/Throat:     Pharynx: No oropharyngeal exudate.  Eyes:     Pupils: Pupils are equal, round, and reactive to light.  Cardiovascular:     Rate and Rhythm: Normal rate and regular rhythm.  Pulmonary:     Effort: Pulmonary effort is normal. No respiratory distress.     Breath sounds: Normal breath sounds. No wheezing.  Abdominal:     General: Bowel sounds are normal. There is no distension.     Palpations: Abdomen is soft. There is no mass.     Tenderness: no abdominal tenderness There is no guarding or rebound.  Musculoskeletal:        General: No tenderness. Normal range of motion.     Cervical back: Normal range of motion and neck supple.  Skin:    General: Skin is warm.  Neurological:     Mental  Status: He is alert and oriented to person, place, and time.  Psychiatric:        Mood and Affect: Affect normal.     LABORATORY DATA:  I have reviewed the data as listed Lab Results  Component Value Date   WBC 5.1 05/21/2021   HGB 13.5 05/21/2021   HCT 40.9 05/21/2021   MCV 97.1 05/21/2021   PLT 223 05/21/2021   Recent Labs    04/23/21 0811 05/07/21 0830 05/21/21 0924  NA 137 137 136  K 4.5 4.3 4.3  CL 104 104 104  CO2 24 25 28   GLUCOSE 133* 120* 96  BUN 17 20 17   CREATININE 0.93 1.04 0.99  CALCIUM 8.8* 8.9 8.8*  GFRNONAA >60 >60 >60  PROT 6.8 6.8 7.3  ALBUMIN 3.7 3.8 3.9  AST 28 27 27   ALT 14 16 16   ALKPHOS 108 117 112  BILITOT 0.7 1.0 1.0    RADIOGRAPHIC STUDIES: I have personally reviewed the radiological images as listed and agreed with the findings in the report. CT Chest Wo Contrast  Result Date: 05/20/2021 CLINICAL DATA:  A 87 year old male with history of non-small cell lung cancer, assess treatment response. EXAM: CT CHEST WITHOUT CONTRAST TECHNIQUE: Multidetector CT imaging of the chest was performed following the standard protocol without IV contrast. COMPARISON:  March 06, 2021. FINDINGS: Cardiovascular: Calcified atheromatous plaque in the thoracic aorta without signs of change from previous imaging. Caliber of ascending thoracic aorta is stable and nonaneurysmal. Mild cardiac enlargement with small pericardial effusion. Signs of scattered coronary artery calcifications. Limited assessment of cardiovascular structures given lack of intravenous contrast. RIGHT-sided Port-A-Cath terminating at the caval to atrial junction. Mediastinum/Nodes: No thoracic inlet lymphadenopathy. No axillary lymphadenopathy. No mediastinal or gross hilar lymphadenopathy. Indistinct LEFT hilum in the setting of post treatment changes. Lungs/Pleura: Small LEFT-sided pleural effusion is slightly increased compared to the previous exam this now extends towards the LEFT lung apex, it  terminated in the LEFT mid chest before and there is slight increase in sub pulmonic component as well. Bandlike changes in the anterior LEFT upper lobe with decreased  conspicuity of areas of consolidation and ground-glass that were seen in the more anterior LEFT chest on the prior study and further contraction of masslike area near the LEFT hilum and more bandlike appearance of changes arising from the LEFT hilum. Bronchiectasis amidst these changes extending into superior LEFT lower lobe and into lingula and LEFT upper lobe. Consolidative changes and bronchiectasis also in the superior segment of the RIGHT lower lobe measuring 3.2 x 2.5 cm, when measured in a similar fashion approximately 3.7 cm greatest dimension on the prior study. No RIGHT-sided effusion. No pneumothorax. Tiny pulmonary nodule in the RIGHT mid chest in the RIGHT middle lobe (image 96/3) 4 mm. Airways are patent. Upper Abdomen: No acute findings relative to visualized upper abdominal contents. The adrenal glands are normal. Musculoskeletal: No acute musculoskeletal process. Spinal degenerative changes. IMPRESSION: 1. Further evolution of post treatment changes in the LEFT chest with contraction of areas of consolidation about the LEFT hilum and decreased conspicuity of arc like ground-glass and bandlike changes in the anterior LEFT chest. 2. Slight increase in LEFT-sided pleural effusion, attention on follow-up. 3. Decreased size of presumed post treatment changes in the superior segment of the RIGHT lower lobe with adjacent fissural distortion. 4. Tiny 4 mm pulmonary nodule in the RIGHT mid chest in the RIGHT middle lobe, unchanged 5. Redemonstration of L1 compression fracture and spinal degenerative changes. 6. Aortic atherosclerosis. Aortic Atherosclerosis (ICD10-I70.0). Electronically Signed   By: Zetta Bills M.D.   On: 05/20/2021 13:23     ASSESSMENT & PLAN:   Cancer of lower lobe of left lung ([place]) # T2N3- Stage III-non-small cell  lung cancer-[favor adenocarcinoma]- s/p  concurrent chemoradiation; Finished Feb 2022]; currently on adjuvant durvalumab. AUG 2022- CT-improvement of the left lower lobe subpleural mass ; however noted to have increasing left-sided pleural effusion  [see below].   # proceed with IMFINZI; Labs today reviewed;  acceptable for treatment today. MAY 2022-TSH-WNL.   # left-sided pleural effusion-monitor for now if worse would recommend diagnostic thoracentesis. Will repeat CXR- in 6 weeks or so.  Check BNP.  #Right lower lobe - ~4.6 x 3.1 cm  [PET 07-2020]- demonstrates no significant hypermetabolic activity (SUV max 1.8]-benign.  Stable.  #Bilateral groundglass nodular/consolidative-suspect pneumonia vs inflammation from durvalumab [patient s/p prednisone & Z-Pak]-resolved on AUG 2022 CT scan.   #History of A. fib on Eliquis- STABLE.    # DISPOSITION: ADD BNP # proceed with  IMFINZI today; # follow up in 2 weeks-MD; labs- cbc/cmp; -;IMFINZI; -Dr.B  # I reviewed the blood work- with the patient in detail; also reviewed the imaging independently [as summarized above]; and with the patient in detail.   All questions were answered. The patient knows to call the clinic with any problems, questions or concerns.   [name], MD 05/21/2021 5:10 PM

## 2021-05-21 NOTE — Addendum Note (Signed)
Addended by: Oneida Arenas on: 05/21/2021 10:30 AM   Modules accepted: Orders

## 2021-05-21 NOTE — Assessment & Plan Note (Addendum)
#   T2N3- Stage III-non-small cell lung cancer-[favor adenocarcinoma]- s/p  concurrent chemoradiation; Finished Feb 2022]; currently on adjuvant durvalumab. AUG 2022- CT-improvement of the left lower lobe subpleural mass ; however noted to have increasing left-sided pleural effusion  [see below].   # proceed with IMFINZI; Labs today reviewed;  acceptable for treatment today. MAY 2022-TSH-WNL.   # left-sided pleural effusion-monitor for now if worse would recommend diagnostic thoracentesis. Will repeat CXR- in 6 weeks or so.  Check BNP.  #Right lower lobe - ~4.6 x 3.1 cm  [PET 07-2020]- demonstrates no significant hypermetabolic activity (SUV max 1.8]-benign.  Stable.  #Bilateral groundglass nodular/consolidative-suspect pneumonia vs inflammation from durvalumab [patient s/p prednisone & Z-Pak]-resolved on AUG 2022 CT scan.   #History of A. fib on Eliquis- STABLE.    # DISPOSITION: ADD BNP # proceed with  IMFINZI today; # follow up in 2 weeks-MD; labs- cbc/cmp; -;IMFINZI; -Dr.B  # I reviewed the blood work- with the patient in detail; also reviewed the imaging independently [as summarized above]; and with the patient in detail.

## 2021-05-21 NOTE — Patient Instructions (Signed)
[facility]  Discharge Instructions: Thank you for choosing [facility] to provide your oncology and hematology care.  If you have a lab appointment with the [facility], please go directly to the [facility] and check in at the registration area.  Wear comfortable clothing and clothing appropriate for easy access to any Portacath or PICC line.   We strive to give you quality time with your provider. You may need to reschedule your appointment if you arrive late (15 or more minutes).  Arriving late affects you and other patients whose appointments are after yours.  Also, if you miss three or more appointments without notifying the office, you may be dismissed from the clinic at the provider's discretion.      For prescription refill requests, have your pharmacy contact our office and allow 72 hours for refills to be completed.    Today you received the following chemotherapy and/or immunotherapy agents : Imfinzi    To help prevent nausea and vomiting after your treatment, we encourage you to take your nausea medication as directed.  BELOW ARE SYMPTOMS THAT SHOULD BE REPORTED IMMEDIATELY: *FEVER GREATER THAN 100.4 F (38 C) OR HIGHER *CHILLS OR SWEATING *NAUSEA AND VOMITING THAT IS NOT CONTROLLED WITH YOUR NAUSEA MEDICATION *UNUSUAL SHORTNESS OF BREATH *UNUSUAL BRUISING OR BLEEDING *URINARY PROBLEMS (pain or burning when urinating, or frequent urination) *BOWEL PROBLEMS (unusual diarrhea, constipation, pain near the anus) TENDERNESS IN MOUTH AND THROAT WITH OR WITHOUT PRESENCE OF ULCERS (sore throat, sores in mouth, or a toothache) UNUSUAL RASH, SWELLING OR PAIN  UNUSUAL VAGINAL DISCHARGE OR ITCHING   Items with * indicate a potential emergency and should be followed up as soon as possible or go to the Emergency Department if any problems should occur.  Please show the CHEMOTHERAPY ALERT CARD or IMMUNOTHERAPY ALERT CARD at check-in to  the Emergency Department and triage nurse.  Should you have questions after your visit or need to cancel or reschedule your appointment, please contact [facility]  [phone] and follow the prompts.  Office hours are 8:00 a.m. to 4:30 p.m. Monday - Friday. Please note that voicemails left after 4:00 p.m. may not be returned until the following business day.  We are closed weekends and major holidays. You have access to a nurse at all times for urgent questions. Please call the main number to the clinic [phone] and follow the prompts.  For any non-urgent questions, you may also contact your provider using MyChart. We now offer e-Visits for anyone 63 and older to request care online for non-urgent symptoms. For details visit [email or web address].   Also download the MyChart app! Go to the app store, search "MyChart", open the app, select [facility], and log in with your MyChart username and password.  Due to Covid, a mask is required upon entering the hospital/clinic. If you do not have a mask, one will be given to you upon arrival. For doctor visits, patients may have 1 support person aged 14 or older with them. For treatment visits, patients cannot have anyone with them due to current Covid guidelines and our immunocompromised population.

## 2021-06-04 ENCOUNTER — Inpatient Hospital Stay: Payer: Medicare Other

## 2021-06-04 ENCOUNTER — Encounter: Payer: Self-pay | Admitting: Internal Medicine

## 2021-06-04 ENCOUNTER — Inpatient Hospital Stay: Payer: Medicare Other | Attending: Internal Medicine

## 2021-06-04 ENCOUNTER — Other Ambulatory Visit: Payer: Self-pay

## 2021-06-04 ENCOUNTER — Inpatient Hospital Stay (HOSPITAL_BASED_OUTPATIENT_CLINIC_OR_DEPARTMENT_OTHER): Payer: Medicare Other | Admitting: Internal Medicine

## 2021-06-04 VITALS — BP 118/52 | HR 64 | Temp 96.9°F | Resp 16 | Ht 72.0 in | Wt 167.0 lb

## 2021-06-04 DIAGNOSIS — I4891 Unspecified atrial fibrillation: Secondary | ICD-10-CM | POA: Diagnosis not present

## 2021-06-04 DIAGNOSIS — C3432 Malignant neoplasm of lower lobe, left bronchus or lung: Secondary | ICD-10-CM | POA: Insufficient documentation

## 2021-06-04 DIAGNOSIS — R918 Other nonspecific abnormal finding of lung field: Secondary | ICD-10-CM | POA: Diagnosis not present

## 2021-06-04 DIAGNOSIS — M7989 Other specified soft tissue disorders: Secondary | ICD-10-CM | POA: Insufficient documentation

## 2021-06-04 DIAGNOSIS — Z5112 Encounter for antineoplastic immunotherapy: Secondary | ICD-10-CM | POA: Insufficient documentation

## 2021-06-04 DIAGNOSIS — R0602 Shortness of breath: Secondary | ICD-10-CM

## 2021-06-04 DIAGNOSIS — Z7901 Long term (current) use of anticoagulants: Secondary | ICD-10-CM | POA: Diagnosis not present

## 2021-06-04 DIAGNOSIS — J90 Pleural effusion, not elsewhere classified: Secondary | ICD-10-CM | POA: Insufficient documentation

## 2021-06-04 DIAGNOSIS — I700 Atherosclerosis of aorta: Secondary | ICD-10-CM | POA: Insufficient documentation

## 2021-06-04 DIAGNOSIS — J479 Bronchiectasis, uncomplicated: Secondary | ICD-10-CM | POA: Insufficient documentation

## 2021-06-04 LAB — CBC WITH DIFFERENTIAL/PLATELET
Abs Immature Granulocytes: 0.01 10*3/uL (ref 0.00–0.07)
Basophils Absolute: 0 10*3/uL (ref 0.0–0.1)
Basophils Relative: 1 %
Eosinophils Absolute: 0.2 10*3/uL (ref 0.0–0.5)
Eosinophils Relative: 4 %
HCT: 40.2 % (ref 39.0–52.0)
Hemoglobin: 13.4 g/dL (ref 13.0–17.0)
Immature Granulocytes: 0 %
Lymphocytes Relative: 18 %
Lymphs Abs: 1 10*3/uL (ref 0.7–4.0)
MCH: 32.4 pg (ref 26.0–34.0)
MCHC: 33.3 g/dL (ref 30.0–36.0)
MCV: 97.3 fL (ref 80.0–100.0)
Monocytes Absolute: 0.7 10*3/uL (ref 0.1–1.0)
Monocytes Relative: 12 %
Neutro Abs: 3.6 10*3/uL (ref 1.7–7.7)
Neutrophils Relative %: 65 %
Platelets: 221 10*3/uL (ref 150–400)
RBC: 4.13 MIL/uL — ABNORMAL LOW (ref 4.22–5.81)
RDW: 13.1 % (ref 11.5–15.5)
WBC: 5.5 10*3/uL (ref 4.0–10.5)
nRBC: 0 % (ref 0.0–0.2)

## 2021-06-04 LAB — COMPREHENSIVE METABOLIC PANEL
ALT: 20 U/L (ref 0–44)
AST: 31 U/L (ref 15–41)
Albumin: 3.8 g/dL (ref 3.5–5.0)
Alkaline Phosphatase: 121 U/L (ref 38–126)
Anion gap: 9 (ref 5–15)
BUN: 17 mg/dL (ref 8–23)
CO2: 24 mmol/L (ref 22–32)
Calcium: 8.8 mg/dL — ABNORMAL LOW (ref 8.9–10.3)
Chloride: 104 mmol/L (ref 98–111)
Creatinine, Ser: 0.97 mg/dL (ref 0.61–1.24)
GFR, Estimated: >60 mL/min (ref >60)
Glucose, Bld: 124 mg/dL — ABNORMAL HIGH (ref 70–99)
Potassium: 4.3 mmol/L (ref 3.5–5.1)
Sodium: 137 mmol/L (ref 135–145)
Total Bilirubin: 0.6 mg/dL (ref 0.3–1.2)
Total Protein: 6.9 g/dL (ref 6.5–8.1)

## 2021-06-04 LAB — BRAIN NATRIURETIC PEPTIDE: B Natriuretic Peptide: 357.2 pg/mL — ABNORMAL HIGH (ref 0.0–100.0)

## 2021-06-04 MED ORDER — SODIUM CHLORIDE 0.9 % IV SOLN
10.0000 mg/kg | Freq: Once | INTRAVENOUS | Status: AC
  Administered 2021-06-04: 740 mg via INTRAVENOUS
  Filled 2021-06-04: qty 10

## 2021-06-04 MED ORDER — SODIUM CHLORIDE 0.9 % IV SOLN
Freq: Once | INTRAVENOUS | Status: AC
  Filled 2021-06-04: qty 250

## 2021-06-04 MED ORDER — HEPARIN SOD (PORK) LOCK FLUSH 100 UNIT/ML IV SOLN
500.0000 [IU] | Freq: Once | INTRAVENOUS | Status: AC | PRN
  Administered 2021-06-04: 500 [IU]
  Filled 2021-06-04: qty 5

## 2021-06-04 NOTE — Progress Notes (Signed)
[facility] NOTE  Patient Care Team: Jason Hire, MD as PCP - General (Internal Medicine) Jason Nab, RN as Oncology Nurse Navigator Jason Sickle, MD as Consulting Physician (Hematology and Oncology)  CHIEF COMPLAINTS/PURPOSE OF CONSULTATION: Lung cancer  #  Oncology History Overview Note  # NOV 2021- LEFT LOWER LOBE NON-SMALL CELL CA [favor adeno ca]; T2N3 [right hilar; subcarinal LN;[name]; NOV 2021-MRI Brain-NEG  # 11/30- carbo-Taxol-RT [RT until 10/23/20]   # A.fib [Jason Warren; Dr.Kowalski]  # # SURVIVORSHIP:   # GENETICS:   #NGS-ordered  DIAGNOSIS: Lung cancer  STAGE:   III      ;  GOALS:  cure  CURRENT/MOST RECENT THERAPY : carbo-Taxol-RT ]   Cancer of lower lobe of left lung ([place])  08/14/2020 Initial Diagnosis   Cancer of lower lobe of left lung ([place])   09/02/2020 - 10/22/2020 Chemotherapy    Patient is on Treatment Plan: LUNG DURVALUMAB Q14D       10/16/2020 Cancer Staging   Staging form: Lung, AJCC 8th Edition - Clinical: Stage IIIB (cT2a, cN3, cM0) - Signed by Jason Sickle, MD on 10/16/2020   12/17/2020 -  Chemotherapy    Patient is on Treatment Plan: LUNG DURVALUMAB Q14D        HISTORY OF PRESENTING ILLNESS:  Jason Warren 87 y.o.  male patient with stage III lung non-small cell lung-currently adjuvant duvalumab is here for follow-up.  No new shortness of breath or cough.  No nausea vomiting.  Mild swelling in ankles.   Review of Systems  Constitutional:  Negative for chills, diaphoresis, fever, malaise/fatigue and weight loss.  HENT:  Negative for nosebleeds and sore throat.   Eyes:  Negative for double vision.  Respiratory:  Negative for hemoptysis, sputum production, shortness of breath and wheezing.   Cardiovascular:  Negative for chest pain, palpitations, orthopnea and leg swelling.  Gastrointestinal:  Negative for abdominal pain, blood in stool, constipation, diarrhea, heartburn, melena,  nausea and vomiting.  Genitourinary:  Negative for dysuria, frequency and urgency.  Musculoskeletal:  Negative for back pain and joint pain.  Skin:  Negative for itching.  Neurological:  Negative for tingling, focal weakness, weakness and headaches.  Endo/Heme/Allergies:  Does not bruise/bleed easily.  Psychiatric/Behavioral:  Negative for depression. The patient is not nervous/anxious and does not have insomnia.     MEDICAL HISTORY:  Past Medical History:  Diagnosis Date   Arthritis    Dysrhythmia    A-fib   Hip pain    left   HOH (hard of hearing)    LBBB (left bundle branch block) 08/05/2020   Lung cancer ([place])    Skin cancer, basal cell     face top of head    SURGICAL HISTORY: Past Surgical History:  Procedure Laterality Date   CATARACT EXTRACTION W/PHACO Right 07/13/2017   Procedure: CATARACT EXTRACTION PHACO AND INTRAOCULAR LENS PLACEMENT ([place]);  Surgeon: Jason Koyanagi, MD;  Location: [facility];  Service: Ophthalmology;  Laterality: Right;  IVA TOPICAL RIGHT   CATARACT EXTRACTION W/PHACO Left 01/04/2018   Procedure: CATARACT EXTRACTION PHACO AND INTRAOCULAR LENS PLACEMENT ([place]) LEFT;  Surgeon: Jason Koyanagi, MD;  Location: [facility];  Service: Ophthalmology;  Laterality: Left;   COLONOSCOPY     IR IMAGING GUIDED PORT INSERTION  08/27/2020   JOINT REPLACEMENT     Left total hip Dr. Su Warren 08-04-18   ROTATOR CUFF REPAIR Right    SKIN CANCER EXCISION     face   TOTAL  HIP ARTHROPLASTY Left 08/04/2018   Procedure: TOTAL HIP ARTHROPLASTY ANTERIOR APPROACH;  Surgeon: Jason Pear, MD;  Location: [facility];  Service: Orthopedics;  Laterality: Left;   VIDEO BRONCHOSCOPY WITH ENDOBRONCHIAL NAVIGATION N/A 08/07/2020   Procedure: VIDEO BRONCHOSCOPY WITH ENDOBRONCHIAL NAVIGATION;  Surgeon: Jason Glazier, MD;  Location: [facility];  Service: Thoracic;  Laterality: N/A;   VIDEO BRONCHOSCOPY WITH ENDOBRONCHIAL ULTRASOUND N/A 08/07/2020   Procedure:  VIDEO BRONCHOSCOPY WITH ENDOBRONCHIAL ULTRASOUND;  Surgeon: Jason Glazier, MD;  Location: [facility];  Service: Thoracic;  Laterality: N/A;    SOCIAL HISTORY: Social History   Socioeconomic History   Marital status: Married    Spouse name: Not on file   Number of children: Not on file   Years of education: Not on file   Highest education level: Not on file  Occupational History   Not on file  Tobacco Use   Smoking status: Former    Packs/day: 1.00    Years: 4.00    Pack years: 4.00    Types: Cigarettes   Smokeless tobacco: Never   Tobacco comments:    quit early 70's  Vaping Use   Vaping Use: Never used  Substance and Sexual Activity   Alcohol use: No   Drug use: No   Sexual activity: Not Currently  Other Topics Concern   Not on file  Social History Narrative   > quit 35 years; smoked for 15 years. Rare alcohol. 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    Social Determinants of Health   Financial Resource Strain: Not on file  Food Insecurity: Not on file  Transportation Needs: Not on file  Physical Activity: Not on file  Stress: Not on file  Social Connections: Not on file  Intimate Partner Violence: Not on file    FAMILY HISTORY: Family History  Problem Relation Age of Onset   Throat cancer Brother         & lung cancer    ALLERGIES:  has No Known Allergies.  MEDICATIONS:  Current Outpatient Medications  Medication Sig Dispense Refill   apixaban (Jason Warren) 5 MG TABS tablet Take 5 mg by mouth 2 (two) times daily.     hydroxypropyl methylcellulose / hypromellose (ISOPTO TEARS / GONIOVISC) 2.5 % ophthalmic solution Place 1 drop into the left eye daily as needed for dry eyes.     lidocaine-prilocaine (EMLA) cream Apply 1 application topically as needed. Apply small amount to port site at least 1 hour prior to it being accessed, cover with plastic wrap 30 g 1   Multiple Vitamin (MULTIVITAMIN) tablet  Take 1 tablet by mouth daily.     Polyethyl Glycol-Propyl Glycol (SYSTANE FREE OP) Apply 1 drop to eye as needed.     pravastatin (PRAVACHOL) 80 MG tablet Take 80 mg by mouth at bedtime.      saw palmetto 160 MG capsule Take 160 mg by mouth 2 (two) times daily.     acetaminophen (TYLENOL) 500 MG tablet Take 500 mg by mouth every 8 (eight) hours as needed for moderate pain. (Patient not taking: No sig reported)     No current facility-administered medications for this visit.   Facility-Administered Medications Ordered in Other Visits  Medication Dose Route Frequency Provider Last Rate Last Admin   durvalumab (IMFINZI) 740 mg in sodium chloride 0.9 % 100 mL chemo infusion  10 mg/kg (Order-Specific) Intravenous Once Jason Sickle, MD  heparin lock flush 100 unit/mL  500 Units Intracatheter Once PRN Charlaine Dalton R, MD       sodium chloride flush (NS) 0.9 % injection 10 mL  10 mL Intravenous PRN Jason Sickle, MD   10 mL at 02/11/21 0835   PHYSICAL EXAMINATION: ECOG PERFORMANCE STATUS: 1 - Symptomatic but completely ambulatory  Vitals:   06/04/21 0834  BP: (!) 118/52  Pulse: 64  Resp: 16  Temp: (!) 96.9 F (36.1 C)  SpO2: 100%   Filed Weights   06/04/21 0834  Weight: 167 lb (75.8 kg)    Physical Exam Constitutional:      Comments: Walking independently but with a limp because of left hip arthritis.  He is alone.  HENT:     Head: Normocephalic and atraumatic.     Mouth/Throat:     Pharynx: No oropharyngeal exudate.  Eyes:     Pupils: Pupils are equal, round, and reactive to light.  Cardiovascular:     Rate and Rhythm: Normal rate and regular rhythm.  Pulmonary:     Effort: Pulmonary effort is normal. No respiratory distress.     Breath sounds: Normal breath sounds. No wheezing.  Abdominal:     General: Bowel sounds are normal. There is no distension.     Palpations: Abdomen is soft. There is no mass.     Tenderness: There is no abdominal  tenderness. There is no guarding or rebound.  Musculoskeletal:        General: No tenderness. Normal range of motion.     Cervical back: Normal range of motion and neck supple.  Skin:    General: Skin is warm.  Neurological:     Mental Status: He is alert and oriented to person, place, and time.  Psychiatric:        Mood and Affect: Affect normal.     LABORATORY DATA:  I have reviewed the data as listed Lab Results  Component Value Date   WBC 5.5 06/04/2021   HGB 13.4 06/04/2021   HCT 40.2 06/04/2021   MCV 97.3 06/04/2021   PLT 221 06/04/2021   Recent Labs    05/07/21 0830 05/21/21 0924 06/04/21 0806  NA 137 136 137  K 4.3 4.3 4.3  CL 104 104 104  CO2 25 28 24   GLUCOSE 120* 96 124*  BUN 20 17 17   CREATININE 1.04 0.99 0.97  CALCIUM 8.9 8.8* 8.8*  GFRNONAA >60 >60 >60  PROT 6.8 7.3 6.9  ALBUMIN 3.8 3.9 3.8  AST 27 27 31   ALT 16 16 20   ALKPHOS 117 112 121  BILITOT 1.0 1.0 0.6    RADIOGRAPHIC STUDIES: I have personally reviewed the radiological images as listed and agreed with the findings in the report. CT Chest Wo Contrast  Result Date: 05/20/2021 CLINICAL DATA:  A 87 year old male with history of non-small cell lung cancer, assess treatment response. EXAM: CT CHEST WITHOUT CONTRAST TECHNIQUE: Multidetector CT imaging of the chest was performed following the standard protocol without IV contrast. COMPARISON:  March 06, 2021. FINDINGS: Cardiovascular: Calcified atheromatous plaque in the thoracic aorta without signs of change from previous imaging. Caliber of ascending thoracic aorta is stable and nonaneurysmal. Mild cardiac enlargement with small pericardial effusion. Signs of scattered coronary artery calcifications. Limited assessment of cardiovascular structures given lack of intravenous contrast. RIGHT-sided Port-A-Cath terminating at the caval to atrial junction. Mediastinum/Nodes: No thoracic inlet lymphadenopathy. No axillary lymphadenopathy. No mediastinal or  gross hilar lymphadenopathy. Indistinct LEFT hilum in the setting  of post treatment changes. Lungs/Pleura: Small LEFT-sided pleural effusion is slightly increased compared to the previous exam this now extends towards the LEFT lung apex, it terminated in the LEFT mid chest before and there is slight increase in sub pulmonic component as well. Bandlike changes in the anterior LEFT upper lobe with decreased conspicuity of areas of consolidation and ground-glass that were seen in the more anterior LEFT chest on the prior study and further contraction of masslike area near the LEFT hilum and more bandlike appearance of changes arising from the LEFT hilum. Bronchiectasis amidst these changes extending into superior LEFT lower lobe and into lingula and LEFT upper lobe. Consolidative changes and bronchiectasis also in the superior segment of the RIGHT lower lobe measuring 3.2 x 2.5 cm, when measured in a similar fashion approximately 3.7 cm greatest dimension on the prior study. No RIGHT-sided effusion. No pneumothorax. Tiny pulmonary nodule in the RIGHT mid chest in the RIGHT middle lobe (image 96/3) 4 mm. Airways are patent. Upper Abdomen: No acute findings relative to visualized upper abdominal contents. The adrenal glands are normal. Musculoskeletal: No acute musculoskeletal process. Spinal degenerative changes. IMPRESSION: 1. Further evolution of post treatment changes in the LEFT chest with contraction of areas of consolidation about the LEFT hilum and decreased conspicuity of arc like ground-glass and bandlike changes in the anterior LEFT chest. 2. Slight increase in LEFT-sided pleural effusion, attention on follow-up. 3. Decreased size of presumed post treatment changes in the superior segment of the RIGHT lower lobe with adjacent fissural distortion. 4. Tiny 4 mm pulmonary nodule in the RIGHT mid chest in the RIGHT middle lobe, unchanged 5. Redemonstration of L1 compression fracture and spinal degenerative  changes. 6. Aortic atherosclerosis. Aortic Atherosclerosis (ICD10-I70.0). Electronically Signed   By: Zetta Bills M.D.   On: 05/20/2021 13:23     ASSESSMENT & PLAN:   No problem-specific Assessment & Plan notes found for this encounter.  All questions were answered. The patient knows to call the clinic with any problems, questions or concerns.   Jason Sickle, MD 06/04/2021 9:26 AM

## 2021-06-04 NOTE — Assessment & Plan Note (Signed)
#   T2N3- Stage III-non-small cell lung cancer-[favor adenocarcinoma]- s/p  concurrent chemoradiation; Finished Feb 2022]; currently on adjuvant durvalumab. AUG 2022- CT-improvement of the left lower lobe subpleural mass ; however noted to have increasing left-sided pleural effusion  [see below]. STABLE.   # proceed with IMFINZI; Labs today reviewed;  acceptable for treatment today. MAY 2022-TSH-WNL.   # left-sided pleural effusion-monitor for now if worse would recommend diagnostic thoracentesis. CXR next visit. Marland Kitchen  #Right lower lobe - ~4.6 x 3.1 cm  [PET 07-2020]- demonstrates no significant hypermetabolic activity (SUV max 1.8]-benign. STABLE.   #Bilateral groundglass nodular/consolidative-suspect pneumonia vs inflammation from durvalumab [patient s/p prednisone & Z-Pak]-resolved on AUG 2022 CT scan. Monitor for now.   # A. fib on Eliquis [Dr.Kowalski]-  STABLE. Mild swelling in legs- EF in Feb 2022- 55-60%; recommend BIL compression stocking/leg elevation. Get CXR next visit.    # DISPOSITION:  # proceed with  IMFINZI today; # follow up in 2 weeks-MD; labs- cbc/cmp;BNP -;IMFINZI; -Dr.B

## 2021-06-04 NOTE — Patient Instructions (Signed)
[facility] ONCOLOGY  Discharge Instructions: Thank you for choosing [facility] to provide your oncology and hematology care.  If you have a lab appointment with the [facility], please go directly to the [facility] and check in at the registration area.  Wear comfortable clothing and clothing appropriate for easy access to any Portacath or PICC line.   We strive to give you quality time with your provider. You may need to reschedule your appointment if you arrive late (15 or more minutes).  Arriving late affects you and other patients whose appointments are after yours.  Also, if you miss three or more appointments without notifying the office, you may be dismissed from the clinic at the provider's discretion.      For prescription refill requests, have your pharmacy contact our office and allow 72 hours for refills to be completed.    Today you received the following chemotherapy and/or immunotherapy agent: Durvalumab      To help prevent nausea and vomiting after your treatment, we encourage you to take your nausea medication as directed.  BELOW ARE SYMPTOMS THAT SHOULD BE REPORTED IMMEDIATELY: *FEVER GREATER THAN 100.4 F (38 C) OR HIGHER *CHILLS OR SWEATING *NAUSEA AND VOMITING THAT IS NOT CONTROLLED WITH YOUR NAUSEA MEDICATION *UNUSUAL SHORTNESS OF BREATH *UNUSUAL BRUISING OR BLEEDING *URINARY PROBLEMS (pain or burning when urinating, or frequent urination) *BOWEL PROBLEMS (unusual diarrhea, constipation, pain near the anus) TENDERNESS IN MOUTH AND THROAT WITH OR WITHOUT PRESENCE OF ULCERS (sore throat, sores in mouth, or a toothache) UNUSUAL RASH, SWELLING OR PAIN  UNUSUAL VAGINAL DISCHARGE OR ITCHING   Items with * indicate a potential emergency and should be followed up as soon as possible or go to the Emergency Department if any problems should occur.  Please show the CHEMOTHERAPY ALERT CARD or IMMUNOTHERAPY ALERT CARD at check-in  to the Emergency Department and triage nurse.  Should you have questions after your visit or need to cancel or reschedule your appointment, please contact [facility]  [phone] and follow the prompts.  Office hours are 8:00 a.m. to 4:30 p.m. Monday - Friday. Please note that voicemails left after 4:00 p.m. may not be returned until the following business day.  We are closed weekends and major holidays. You have access to a nurse at all times for urgent questions. Please call the main number to the clinic [phone] and follow the prompts.  For any non-urgent questions, you may also contact your provider using MyChart. We now offer e-Visits for anyone 63 and older to request care online for non-urgent symptoms. For details visit [email or web address].   Also download the MyChart app! Go to the app store, search "MyChart", open the app, select [facility], and log in with your MyChart username and password.  Due to Covid, a mask is required upon entering the hospital/clinic. If you do not have a mask, one will be given to you upon arrival. For doctor visits, patients may have 1 support person aged 18 or older with them. For treatment visits, patients cannot have anyone with them due to current Covid guidelines and our immunocompromised population. Durvalumab injection What is this medication? DURVALUMAB (dur VAL ue mab) is a monoclonal antibody. It is used to treat lung cancer. This medicine may be used for other purposes; ask your health care provider or pharmacist if you have questions. COMMON BRAND NAME(S): IMFINZI What should I tell my care team before I take this medication?  They need to know if you have any of these conditions: autoimmune diseases like Crohn's disease, ulcerative colitis, or lupus have had or planning to have an allogeneic stem cell transplant (uses someone else's stem cells) history of organ transplant history of radiation to the  chest nervous system problems like myasthenia gravis or Guillain-Barre syndrome an unusual or allergic reaction to durvalumab, other medicines, foods, dyes, or preservatives pregnant or trying to get pregnant breast-feeding How should I use this medication? This medicine is for infusion into a vein. It is given by a health care professional in a hospital or clinic setting. A special MedGuide will be given to you before each treatment. Be sure to read this information carefully each time. Talk to your pediatrician regarding the use of this medicine in children. Special care may be needed. Overdosage: If you think you have taken too much of this medicine contact a poison control center or emergency room at once. NOTE: This medicine is only for you. Do not share this medicine with others. What if I miss a dose? It is important not to miss your dose. Call your doctor or health care professional if you are unable to keep an appointment. What may interact with this medication? Interactions have not been studied. This list may not describe all possible interactions. Give your health care provider a list of all the medicines, herbs, non-prescription drugs, or dietary supplements you use. Also tell them if you smoke, drink alcohol, or use illegal drugs. Some items may interact with your medicine. What should I watch for while using this medication? This drug may make you feel generally unwell. Continue your course of treatment even though you feel ill unless your doctor tells you to stop. You may need blood work done while you are taking this medicine. Do not become pregnant while taking this medicine or for 3 months after stopping it. Women should inform their doctor if they wish to become pregnant or think they might be pregnant. There is a potential for serious side effects to an unborn child. Talk to your health care professional or pharmacist for more information. Do not breast-feed an infant while  taking this medicine or for 3 months after stopping it. What side effects may I notice from receiving this medication? Side effects that you should report to your doctor or health care professional as soon as possible: allergic reactions like skin rash, itching or hives, swelling of the face, lips, or tongue black, tarry stools bloody or watery diarrhea breathing problems change in emotions or moods change in sex drive changes in vision chest pain or chest tightness chills confusion cough facial flushing fever headache signs and symptoms of high blood sugar such as dizziness; dry mouth; dry skin; fruity breath; nausea; stomach pain; increased hunger or thirst; increased urination signs and symptoms of liver injury like dark yellow or brown urine; general ill feeling or flu-like symptoms; light-colored stools; loss of appetite; nausea; right upper belly pain; unusually weak or tired; yellowing of the eyes or skin stomach pain trouble passing urine or change in the amount of urine weight gain or weight loss Side effects that usually do not require medical attention (report these to your doctor or health care professional if they continue or are bothersome): bone pain constipation loss of appetite muscle pain nausea swelling of the ankles, feet, hands tiredness This list may not describe all possible side effects. Call your doctor for medical advice about side effects. You may report side effects to  FDA at 1-800-FDA-1088. Where should I keep my medication? This drug is given in a hospital or clinic and will not be stored at home. NOTE: This sheet is a summary. It may not cover all possible information. If you have questions about this medicine, talk to your doctor, pharmacist, or health care provider.  2022 Elsevier/Gold Standard (2019-11-29 13:01:29)

## 2021-06-11 ENCOUNTER — Encounter: Payer: Self-pay | Admitting: *Deleted

## 2021-06-18 ENCOUNTER — Inpatient Hospital Stay: Payer: Medicare Other

## 2021-06-18 ENCOUNTER — Ambulatory Visit
Admission: RE | Admit: 2021-06-18 | Discharge: 2021-06-18 | Disposition: A | Discharge status: Home / Self Care | Payer: Medicare Other | Attending: Internal Medicine | Admitting: Internal Medicine

## 2021-06-18 ENCOUNTER — Ambulatory Visit
Admission: RE | Admit: 2021-06-18 | Discharge: 2021-06-18 | Disposition: A | Discharge status: Home / Self Care | Payer: Medicare Other | Source: Ambulatory Visit | Attending: Internal Medicine | Admitting: Internal Medicine

## 2021-06-18 ENCOUNTER — Other Ambulatory Visit: Payer: Self-pay

## 2021-06-18 ENCOUNTER — Inpatient Hospital Stay (HOSPITAL_BASED_OUTPATIENT_CLINIC_OR_DEPARTMENT_OTHER): Payer: Medicare Other | Admitting: Internal Medicine

## 2021-06-18 VITALS — BP 123/71 | HR 70 | Temp 96.9°F | Resp 20 | Ht 72.0 in | Wt 164.6 lb

## 2021-06-18 DIAGNOSIS — J90 Pleural effusion, not elsewhere classified: Secondary | ICD-10-CM

## 2021-06-18 DIAGNOSIS — R0602 Shortness of breath: Secondary | ICD-10-CM | POA: Diagnosis not present

## 2021-06-18 DIAGNOSIS — C3432 Malignant neoplasm of lower lobe, left bronchus or lung: Secondary | ICD-10-CM | POA: Diagnosis not present

## 2021-06-18 DIAGNOSIS — I700 Atherosclerosis of aorta: Secondary | ICD-10-CM | POA: Insufficient documentation

## 2021-06-18 LAB — CBC WITH DIFFERENTIAL/PLATELET
Abs Immature Granulocytes: 0.01 10*3/uL (ref 0.00–0.07)
Basophils Absolute: 0 10*3/uL (ref 0.0–0.1)
Basophils Relative: 0 %
Eosinophils Absolute: 0.2 10*3/uL (ref 0.0–0.5)
Eosinophils Relative: 3 %
HCT: 40.1 % (ref 39.0–52.0)
Hemoglobin: 13.4 g/dL (ref 13.0–17.0)
Immature Granulocytes: 0 %
Lymphocytes Relative: 19 %
Lymphs Abs: 0.9 10*3/uL (ref 0.7–4.0)
MCH: 32.2 pg (ref 26.0–34.0)
MCHC: 33.4 g/dL (ref 30.0–36.0)
MCV: 96.4 fL (ref 80.0–100.0)
Monocytes Absolute: 0.6 10*3/uL (ref 0.1–1.0)
Monocytes Relative: 13 %
Neutro Abs: 3 10*3/uL (ref 1.7–7.7)
Neutrophils Relative %: 65 %
Platelets: 223 10*3/uL (ref 150–400)
RBC: 4.16 MIL/uL — ABNORMAL LOW (ref 4.22–5.81)
RDW: 12.9 % (ref 11.5–15.5)
WBC: 4.7 10*3/uL (ref 4.0–10.5)
nRBC: 0 % (ref 0.0–0.2)

## 2021-06-18 LAB — COMPREHENSIVE METABOLIC PANEL
ALT: 14 U/L (ref 0–44)
AST: 28 U/L (ref 15–41)
Albumin: 3.8 g/dL (ref 3.5–5.0)
Alkaline Phosphatase: 112 U/L (ref 38–126)
Anion gap: 8 (ref 5–15)
BUN: 21 mg/dL (ref 8–23)
CO2: 25 mmol/L (ref 22–32)
Calcium: 9 mg/dL (ref 8.9–10.3)
Chloride: 105 mmol/L (ref 98–111)
Creatinine, Ser: 1.05 mg/dL (ref 0.61–1.24)
GFR, Estimated: >60 mL/min (ref >60)
Glucose, Bld: 123 mg/dL — ABNORMAL HIGH (ref 70–99)
Potassium: 4.1 mmol/L (ref 3.5–5.1)
Sodium: 138 mmol/L (ref 135–145)
Total Bilirubin: 0.9 mg/dL (ref 0.3–1.2)
Total Protein: 7.2 g/dL (ref 6.5–8.1)

## 2021-06-18 LAB — BRAIN NATRIURETIC PEPTIDE: B Natriuretic Peptide: 391.9 pg/mL — ABNORMAL HIGH (ref 0.0–100.0)

## 2021-06-18 MED ORDER — HEPARIN SOD (PORK) LOCK FLUSH 100 UNIT/ML IV SOLN
INTRAVENOUS | Status: AC
  Administered 2021-06-18: 500 [IU]
  Filled 2021-06-18: qty 5

## 2021-06-18 MED ORDER — SODIUM CHLORIDE 0.9 % IV SOLN
Freq: Once | INTRAVENOUS | Status: AC
  Filled 2021-06-18: qty 250

## 2021-06-18 MED ORDER — HEPARIN SOD (PORK) LOCK FLUSH 100 UNIT/ML IV SOLN
500.0000 [IU] | Freq: Once | INTRAVENOUS | Status: AC | PRN
  Filled 2021-06-18: qty 5

## 2021-06-18 MED ORDER — SODIUM CHLORIDE 0.9 % IV SOLN
10.0000 mg/kg | Freq: Once | INTRAVENOUS | Status: AC
  Administered 2021-06-18: 740 mg via INTRAVENOUS
  Filled 2021-06-18: qty 4.8

## 2021-06-18 MED ORDER — SODIUM CHLORIDE 0.9% FLUSH
10.0000 mL | Freq: Once | INTRAVENOUS | Status: AC
  Administered 2021-06-18: 10 mL via INTRAVENOUS
  Filled 2021-06-18: qty 10

## 2021-06-18 NOTE — Patient Instructions (Signed)
[facility] ONCOLOGY   Discharge Instructions: Thank you for choosing [facility] to provide your oncology and hematology care.  If you have a lab appointment with the [facility], please go directly to the [facility] and check in at the registration area.  Wear comfortable clothing and clothing appropriate for easy access to any Portacath or PICC line.   We strive to give you quality time with your provider. You may need to reschedule your appointment if you arrive late (15 or more minutes).  Arriving late affects you and other patients whose appointments are after yours.  Also, if you miss three or more appointments without notifying the office, you may be dismissed from the clinic at the provider's discretion.      For prescription refill requests, have your pharmacy contact our office and allow 72 hours for refills to be completed.    Today you received the following chemotherapy and/or immunotherapy agents: Imfinzi.      To help prevent nausea and vomiting after your treatment, we encourage you to take your nausea medication as directed.  BELOW ARE SYMPTOMS THAT SHOULD BE REPORTED IMMEDIATELY: *FEVER GREATER THAN 100.4 F (38 C) OR HIGHER *CHILLS OR SWEATING *NAUSEA AND VOMITING THAT IS NOT CONTROLLED WITH YOUR NAUSEA MEDICATION *UNUSUAL SHORTNESS OF BREATH *UNUSUAL BRUISING OR BLEEDING *URINARY PROBLEMS (pain or burning when urinating, or frequent urination) *BOWEL PROBLEMS (unusual diarrhea, constipation, pain near the anus) TENDERNESS IN MOUTH AND THROAT WITH OR WITHOUT PRESENCE OF ULCERS (sore throat, sores in mouth, or a toothache) UNUSUAL RASH, SWELLING OR PAIN  UNUSUAL VAGINAL DISCHARGE OR ITCHING   Items with * indicate a potential emergency and should be followed up as soon as possible or go to the Emergency Department if any problems should occur.  Please show the CHEMOTHERAPY ALERT CARD or IMMUNOTHERAPY ALERT CARD at check-in  to the Emergency Department and triage nurse.  Should you have questions after your visit or need to cancel or reschedule your appointment, please contact [facility]  [phone] and follow the prompts.  Office hours are 8:00 a.m. to 4:30 p.m. Monday - Friday. Please note that voicemails left after 4:00 p.m. may not be returned until the following business day.  We are closed weekends and major holidays. You have access to a nurse at all times for urgent questions. Please call the main number to the clinic [phone] and follow the prompts.  For any non-urgent questions, you may also contact your provider using MyChart. We now offer e-Visits for anyone 51 and older to request care online for non-urgent symptoms. For details visit [email or web address].   Also download the MyChart app! Go to the app store, search "MyChart", open the app, select [facility], and log in with your MyChart username and password.  Due to Covid, a mask is required upon entering the hospital/clinic. If you do not have a mask, one will be given to you upon arrival. For doctor visits, patients may have 1 support person aged 53 or older with them. For treatment visits, patients cannot have anyone with them due to current Covid guidelines and our immunocompromised population.

## 2021-06-18 NOTE — Assessment & Plan Note (Addendum)
#   T2N3- Stage III-non-small cell lung cancer-[favor adenocarcinoma]- s/p  concurrent chemoradiation; Finished Feb 2022]; currently on adjuvant durvalumab. AUG 2022- CT-improvement of the left lower lobe subpleural mass ; however noted to have increasing left-sided pleural effusion  [see below]. STABLE.   # proceed with IMFINZI; Labs today reviewed;  acceptable for treatment today. MAY 2022-TSH-WNL.   # left-sided pleural effusion-monitor for now if worse would recommend diagnostic thoracentesis- STABLE; CXR today  #Bilateral groundglass nodular/consolidative-suspect pneumonia vs inflammation from durvalumab [patient s/p prednisone & Z-Pak]-resolved on AUG 2022 CT scan. Monitor for now.   # A. fib on Eliquis [Dr.Kowalski]-  STABLE. . Mild swelling in legs- EF in Feb 2022- 55-60%; recommend BIL compression stocking/leg elevation.  # Vaccination counselling: recommend Flu shot; and also recommend #3 booster [2 months from #2 booster]. Will need Pneumonia shot.   # DISPOSITION:  # proceed with  IMFINZI today; # follow up in 2 weeks-MD; labs- cbc/cmp;BNP -;IMFINZI; -Dr.B

## 2021-06-18 NOTE — Progress Notes (Signed)
[facility] NOTE  Patient Care Team: Baxter Hire, MD as PCP - General (Internal Medicine) [name], RN as Oncology Nurse Navigator [name], MD as Consulting Physician (Hematology and Oncology)  CHIEF COMPLAINTS/PURPOSE OF CONSULTATION: Lung cancer  #  Oncology History Overview Note  # NOV 2021- LEFT LOWER LOBE NON-SMALL CELL CA [favor adeno ca]; T2N3 [right hilar; subcarinal LN;[name]; NOV 2021-MRI [facility] [place]  # 11/30- carbo-Taxol-RT [RT until 10/23/20]  # Right lower lobe - ~4.6 x 3.1 cm  [PET 07-2020]- demonstrates no significant hypermetabolic activity (SUV max 1.8]-benign. STABLE.   # A.fib [Eliquis; Dr.Kowalski]  # # SURVIVORSHIP:   # GENETICS:   #NGS-ordered  DIAGNOSIS: Lung cancer  STAGE:   III      ;  GOALS:  cure  CURRENT/MOST RECENT THERAPY : carbo-Taxol-RT ]   Cancer of lower lobe of left lung ([place])  08/14/2020 Initial Diagnosis   Cancer of lower lobe of left lung ([place])   09/02/2020 - 10/22/2020 Chemotherapy    Patient is on Treatment Plan: LUNG DURVALUMAB Q14D       10/16/2020 Cancer Staging   Staging form: Lung, AJCC 8th Edition - Clinical: Stage IIIB (cT2a, cN3, cM0) - Signed by [name], MD on 10/16/2020   12/17/2020 -  Chemotherapy    Patient is on Treatment Plan: LUNG DURVALUMAB Q14D        HISTORY OF PRESENTING ILLNESS: Walking independently but with a limp because of left hip arthritis.  He is alone. Jason Warren 87 y.o.  male patient with stage III lung non-small cell lung-currently adjuvant duvalumab is here for follow-up.  Denies any worsening swelling in the ankles.  No worsening shortness of breath or cough.  No fever no chills.  No nausea no vomiting no diarrhea.  No skin rash.   Review of Systems  Constitutional:  Negative for chills, diaphoresis, fever, malaise/fatigue and weight loss.  HENT:  Negative for nosebleeds and sore throat.   Eyes:  Negative for double  vision.  Respiratory:  Negative for hemoptysis, sputum production, shortness of breath and wheezing.   Cardiovascular:  Negative for chest pain, palpitations, orthopnea and leg swelling.  Gastrointestinal:  Negative for abdominal pain, blood in stool, constipation, diarrhea, heartburn, melena, nausea and vomiting.  Genitourinary:  Negative for dysuria, frequency and urgency.  Musculoskeletal:  Negative for back pain and joint pain.  Skin:  Negative for itching.  Neurological:  Negative for tingling, focal weakness, weakness and headaches.  Endo/Heme/Allergies:  Does not bruise/bleed easily.  Psychiatric/Behavioral:  Negative for depression. The patient is not nervous/anxious and does not have insomnia.     MEDICAL HISTORY:  Past Medical History:  Diagnosis Date   Arthritis    Dysrhythmia    A-fib   Hip pain    left   HOH (hard of hearing)    LBBB (left bundle branch block) 08/05/2020   Lung cancer ([place])    Skin cancer, basal cell     face top of head    SURGICAL HISTORY: Past Surgical History:  Procedure Laterality Date   CATARACT EXTRACTION W/PHACO Right 07/13/2017   Procedure: CATARACT EXTRACTION PHACO AND INTRAOCULAR LENS PLACEMENT ([place]);  Surgeon: Leandrew Koyanagi, MD;  Location: [facility];  Service: Ophthalmology;  Laterality: Right;  IVA TOPICAL RIGHT   CATARACT EXTRACTION W/PHACO Left 01/04/2018   Procedure: CATARACT EXTRACTION PHACO AND INTRAOCULAR LENS PLACEMENT ([place]) LEFT;  Surgeon: Leandrew Koyanagi, MD;  Location: [facility];  Service: Ophthalmology;  Laterality: Left;   COLONOSCOPY     IR IMAGING GUIDED PORT INSERTION  08/27/2020   JOINT REPLACEMENT     Left total hip Dr. Su Hoff 08-04-18   ROTATOR CUFF REPAIR Right    SKIN CANCER EXCISION     face   TOTAL HIP ARTHROPLASTY Left 08/04/2018   Procedure: TOTAL HIP ARTHROPLASTY ANTERIOR APPROACH;  Surgeon: Frederik Pear, MD;  Location: [facility];  Service: Orthopedics;  Laterality: Left;    VIDEO BRONCHOSCOPY WITH ENDOBRONCHIAL NAVIGATION N/A 08/07/2020   Procedure: VIDEO BRONCHOSCOPY WITH ENDOBRONCHIAL NAVIGATION;  Surgeon: Ottie Glazier, MD;  Location: [facility];  Service: Thoracic;  Laterality: N/A;   VIDEO BRONCHOSCOPY WITH ENDOBRONCHIAL ULTRASOUND N/A 08/07/2020   Procedure: VIDEO BRONCHOSCOPY WITH ENDOBRONCHIAL ULTRASOUND;  Surgeon: Ottie Glazier, MD;  Location: [facility];  Service: Thoracic;  Laterality: N/A;    SOCIAL HISTORY: Social History   Socioeconomic History   Marital status: Married    Spouse name: Not on file   Number of children: Not on file   Years of education: Not on file   Highest education level: Not on file  Occupational History   Not on file  Tobacco Use   Smoking status: Former    Packs/day: 1.00    Years: 4.00    Pack years: 4.00    Types: Cigarettes   Smokeless tobacco: Never   Tobacco comments:    quit early 70's  Vaping Use   Vaping Use: Never used  Substance and Sexual Activity   Alcohol use: No   Drug use: No   Sexual activity: Not Currently  Other Topics Concern   Not on file  Social History Narrative   > quit 35 years; smoked for 15 years. Rare alcohol. 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    Social Determinants of Health   Financial Resource Strain: Not on file  Food Insecurity: Not on file  Transportation Needs: Not on file  Physical Activity: Not on file  Stress: Not on file  Social Connections: Not on file  Intimate Partner Violence: Not on file    FAMILY HISTORY: Family History  Problem Relation Age of Onset   Throat cancer Brother         & lung cancer    ALLERGIES:  has No Known Allergies.  MEDICATIONS:  Current Outpatient Medications  Medication Sig Dispense Refill   apixaban (ELIQUIS) 5 MG TABS tablet Take 5 mg by mouth 2 (two) times daily.     hydroxypropyl methylcellulose / hypromellose (ISOPTO TEARS / GONIOVISC) 2.5 % ophthalmic  solution Place 1 drop into the left eye daily as needed for dry eyes.     lidocaine-prilocaine (EMLA) cream Apply 1 application topically as needed. Apply small amount to port site at least 1 hour prior to it being accessed, cover with plastic wrap 30 g 1   Multiple Vitamin (MULTIVITAMIN) tablet Take 1 tablet by mouth daily.     Polyethyl Glycol-Propyl Glycol (SYSTANE FREE OP) Apply 1 drop to eye as needed.     pravastatin (PRAVACHOL) 80 MG tablet Take 80 mg by mouth at bedtime.      saw palmetto 160 MG capsule Take 160 mg by mouth 2 (two) times daily.     acetaminophen (TYLENOL) 500 MG tablet Take 500 mg by mouth every 8 (eight) hours as needed for moderate pain. (Patient not taking: No sig reported)     No current facility-administered medications  for this visit.   Facility-Administered Medications Ordered in Other Visits  Medication Dose Route Frequency Provider Last Rate Last Admin   durvalumab (IMFINZI) 740 mg in sodium chloride 0.9 % 100 mL chemo infusion  10 mg/kg (Order-Specific) Intravenous Once Charlaine Dalton R, MD       heparin lock flush 100 unit/mL  500 Units Intracatheter Once PRN [name], MD       sodium chloride flush (NS) 0.9 % injection 10 mL  10 mL Intravenous PRN [name], MD   10 mL at 02/11/21 0835   PHYSICAL EXAMINATION: ECOG PERFORMANCE STATUS: 1 - Symptomatic but completely ambulatory  Vitals:   06/18/21 0843  BP: 123/71  Pulse: 70  Resp: 20  Temp: (!) 96.9 F (36.1 C)   Filed Weights   06/18/21 0843  Weight: 164 lb 9.6 oz (74.7 kg)    Physical Exam HENT:     Head: Normocephalic and atraumatic.     Mouth/Throat:     Pharynx: No oropharyngeal exudate.  Eyes:     Pupils: Pupils are equal, round, and reactive to light.  Cardiovascular:     Rate and Rhythm: Normal rate and regular rhythm.  Pulmonary:     Effort: Pulmonary effort is normal. No respiratory distress.     Breath sounds: Normal breath sounds. No wheezing.   Abdominal:     General: Bowel sounds are normal. There is no distension.     Palpations: Abdomen is soft. There is no mass.     Tenderness: There is no abdominal tenderness. There is no guarding or rebound.  Musculoskeletal:        General: No tenderness. Normal range of motion.     Cervical back: Normal range of motion and neck supple.  Skin:    General: Skin is warm.  Neurological:     Mental Status: He is alert and oriented to person, place, and time.  Psychiatric:        Mood and Affect: Affect normal.     LABORATORY DATA:  I have reviewed the data as listed Lab Results  Component Value Date   WBC 4.7 06/18/2021   HGB 13.4 06/18/2021   HCT 40.1 06/18/2021   MCV 96.4 06/18/2021   PLT 223 06/18/2021   Recent Labs    05/21/21 0924 06/04/21 0806 06/18/21 0804  NA 136 137 138  K 4.3 4.3 4.1  CL 104 104 105  CO2 28 24 25   GLUCOSE 96 124* 123*  BUN 17 17 21   CREATININE 0.99 0.97 1.05  CALCIUM 8.8* 8.8* 9.0  GFRNONAA >60 >60 >60  PROT 7.3 6.9 7.2  ALBUMIN 3.9 3.8 3.8  AST 27 31 28   ALT 16 20 14   ALKPHOS 112 121 112  BILITOT 1.0 0.6 0.9    RADIOGRAPHIC STUDIES: I have personally reviewed the radiological images as listed and agreed with the findings in the report. CT Chest Wo Contrast  Result Date: 05/20/2021 CLINICAL DATA:  A 87 year old male with history of non-small cell lung cancer, assess treatment response. EXAM: CT CHEST WITHOUT CONTRAST TECHNIQUE: Multidetector CT imaging of the chest was performed following the standard protocol without IV contrast. COMPARISON:  March 06, 2021. FINDINGS: Cardiovascular: Calcified atheromatous plaque in the thoracic aorta without signs of change from previous imaging. Caliber of ascending thoracic aorta is stable and nonaneurysmal. Mild cardiac enlargement with small pericardial effusion. Signs of scattered coronary artery calcifications. Limited assessment of cardiovascular structures given lack of intravenous contrast.  RIGHT-sided Port-A-Cath  terminating at the caval to atrial junction. Mediastinum/Nodes: No thoracic inlet lymphadenopathy. No axillary lymphadenopathy. No mediastinal or gross hilar lymphadenopathy. Indistinct LEFT hilum in the setting of post treatment changes. Lungs/Pleura: Small LEFT-sided pleural effusion is slightly increased compared to the previous exam this now extends towards the LEFT lung apex, it terminated in the LEFT mid chest before and there is slight increase in sub pulmonic component as well. Bandlike changes in the anterior LEFT upper lobe with decreased conspicuity of areas of consolidation and ground-glass that were seen in the more anterior LEFT chest on the prior study and further contraction of masslike area near the LEFT hilum and more bandlike appearance of changes arising from the LEFT hilum. Bronchiectasis amidst these changes extending into superior LEFT lower lobe and into lingula and LEFT upper lobe. Consolidative changes and bronchiectasis also in the superior segment of the RIGHT lower lobe measuring 3.2 x 2.5 cm, when measured in a similar fashion approximately 3.7 cm greatest dimension on the prior study. No RIGHT-sided effusion. No pneumothorax. Tiny pulmonary nodule in the RIGHT mid chest in the RIGHT middle lobe (image 96/3) 4 mm. Airways are patent. Upper Abdomen: No acute findings relative to visualized upper abdominal contents. The adrenal glands are normal. Musculoskeletal: No acute musculoskeletal process. Spinal degenerative changes. IMPRESSION: 1. Further evolution of post treatment changes in the LEFT chest with contraction of areas of consolidation about the LEFT hilum and decreased conspicuity of arc like ground-glass and bandlike changes in the anterior LEFT chest. 2. Slight increase in LEFT-sided pleural effusion, attention on follow-up. 3. Decreased size of presumed post treatment changes in the superior segment of the RIGHT lower lobe with adjacent fissural  distortion. 4. Tiny 4 mm pulmonary nodule in the RIGHT mid chest in the RIGHT middle lobe, unchanged 5. Redemonstration of L1 compression fracture and spinal degenerative changes. 6. Aortic atherosclerosis. Aortic Atherosclerosis (ICD10-I70.0). Electronically Signed   By: Zetta Bills M.D.   On: 05/20/2021 13:23     ASSESSMENT & PLAN:   Cancer of lower lobe of left lung ([place]) # T2N3- Stage III-non-small cell lung cancer-[favor adenocarcinoma]- s/p  concurrent chemoradiation; Finished Feb 2022]; currently on adjuvant durvalumab. AUG 2022- CT-improvement of the left lower lobe subpleural mass ; however noted to have increasing left-sided pleural effusion  [see below]. STABLE.   # proceed with IMFINZI; Labs today reviewed;  acceptable for treatment today. MAY 2022-TSH-WNL.   # left-sided pleural effusion-monitor for now if worse would recommend diagnostic thoracentesis- STABLE; CXR today  #Bilateral groundglass nodular/consolidative-suspect pneumonia vs inflammation from durvalumab [patient s/p prednisone & Z-Pak]-resolved on AUG 2022 CT scan. Monitor for now.   # A. fib on Eliquis [Dr.Kowalski]-  STABLE. . Mild swelling in legs- EF in Feb 2022- 55-60%; recommend BIL compression stocking/leg elevation.  # Vaccination counselling: recommend Flu shot; and also recommend #3 booster [2 months from #2 booster]. Will need Pneumonia shot.   # DISPOSITION:  # proceed with  IMFINZI today; # follow up in 2 weeks-MD; labs- cbc/cmp;BNP -;IMFINZI; -Dr.B  All questions were answered. The patient knows to call the clinic with any problems, questions or concerns.   [name], MD 06/18/2021 9:49 AM

## 2021-06-18 NOTE — Patient Instructions (Signed)
#  Please get chest x-ray done after you are done with infusion today.

## 2021-07-02 ENCOUNTER — Other Ambulatory Visit: Payer: Self-pay

## 2021-07-02 ENCOUNTER — Inpatient Hospital Stay (HOSPITAL_BASED_OUTPATIENT_CLINIC_OR_DEPARTMENT_OTHER): Payer: Medicare Other | Admitting: Internal Medicine

## 2021-07-02 ENCOUNTER — Inpatient Hospital Stay: Payer: Medicare Other

## 2021-07-02 ENCOUNTER — Encounter: Payer: Self-pay | Admitting: Internal Medicine

## 2021-07-02 VITALS — BP 120/67 | HR 69 | Temp 97.8°F | Resp 20 | Wt 164.6 lb

## 2021-07-02 DIAGNOSIS — C3432 Malignant neoplasm of lower lobe, left bronchus or lung: Secondary | ICD-10-CM

## 2021-07-02 DIAGNOSIS — Z95828 Presence of other vascular implants and grafts: Secondary | ICD-10-CM

## 2021-07-02 DIAGNOSIS — R5383 Other fatigue: Secondary | ICD-10-CM

## 2021-07-02 DIAGNOSIS — Z5112 Encounter for antineoplastic immunotherapy: Secondary | ICD-10-CM | POA: Diagnosis not present

## 2021-07-02 DIAGNOSIS — R0602 Shortness of breath: Secondary | ICD-10-CM

## 2021-07-02 LAB — COMPREHENSIVE METABOLIC PANEL
ALT: 16 U/L (ref 0–44)
AST: 28 U/L (ref 15–41)
Albumin: 3.9 g/dL (ref 3.5–5.0)
Alkaline Phosphatase: 125 U/L (ref 38–126)
Anion gap: 7 (ref 5–15)
BUN: 18 mg/dL (ref 8–23)
CO2: 26 mmol/L (ref 22–32)
Calcium: 9 mg/dL (ref 8.9–10.3)
Chloride: 105 mmol/L (ref 98–111)
Creatinine, Ser: 1.04 mg/dL (ref 0.61–1.24)
GFR, Estimated: >60 mL/min (ref >60)
Glucose, Bld: 100 mg/dL — ABNORMAL HIGH (ref 70–99)
Potassium: 4.3 mmol/L (ref 3.5–5.1)
Sodium: 138 mmol/L (ref 135–145)
Total Bilirubin: 0.8 mg/dL (ref 0.3–1.2)
Total Protein: 7.3 g/dL (ref 6.5–8.1)

## 2021-07-02 LAB — CBC WITH DIFFERENTIAL/PLATELET
Abs Immature Granulocytes: 0.01 10*3/uL (ref 0.00–0.07)
Basophils Absolute: 0 10*3/uL (ref 0.0–0.1)
Basophils Relative: 0 %
Eosinophils Absolute: 0.2 10*3/uL (ref 0.0–0.5)
Eosinophils Relative: 4 %
HCT: 41.8 % (ref 39.0–52.0)
Hemoglobin: 13.9 g/dL (ref 13.0–17.0)
Immature Granulocytes: 0 %
Lymphocytes Relative: 19 %
Lymphs Abs: 0.9 10*3/uL (ref 0.7–4.0)
MCH: 31.9 pg (ref 26.0–34.0)
MCHC: 33.3 g/dL (ref 30.0–36.0)
MCV: 95.9 fL (ref 80.0–100.0)
Monocytes Absolute: 0.7 10*3/uL (ref 0.1–1.0)
Monocytes Relative: 14 %
Neutro Abs: 3 10*3/uL (ref 1.7–7.7)
Neutrophils Relative %: 63 %
Platelets: 210 10*3/uL (ref 150–400)
RBC: 4.36 MIL/uL (ref 4.22–5.81)
RDW: 13 % (ref 11.5–15.5)
WBC: 4.8 10*3/uL (ref 4.0–10.5)
nRBC: 0 % (ref 0.0–0.2)

## 2021-07-02 LAB — BRAIN NATRIURETIC PEPTIDE: B Natriuretic Peptide: 367.6 pg/mL — ABNORMAL HIGH (ref 0.0–100.0)

## 2021-07-02 LAB — TSH: TSH: 4.278 u[IU]/mL (ref 0.350–4.500)

## 2021-07-02 MED ORDER — SODIUM CHLORIDE 0.9 % IV SOLN
Freq: Once | INTRAVENOUS | Status: AC
  Filled 2021-07-02: qty 250

## 2021-07-02 MED ORDER — HEPARIN SOD (PORK) LOCK FLUSH 100 UNIT/ML IV SOLN
INTRAVENOUS | Status: AC
  Administered 2021-07-02: 500 [IU]
  Filled 2021-07-02: qty 5

## 2021-07-02 MED ORDER — HEPARIN SOD (PORK) LOCK FLUSH 100 UNIT/ML IV SOLN
500.0000 [IU] | Freq: Once | INTRAVENOUS | Status: AC | PRN
  Filled 2021-07-02: qty 5

## 2021-07-02 MED ORDER — SODIUM CHLORIDE 0.9 % IV SOLN
10.0000 mg/kg | Freq: Once | INTRAVENOUS | Status: AC
  Administered 2021-07-02: 740 mg via INTRAVENOUS
  Filled 2021-07-02: qty 10

## 2021-07-02 NOTE — Assessment & Plan Note (Addendum)
#   T2N3- Stage III-non-small cell lung cancer-[favor adenocarcinoma]- s/p  concurrent chemoradiation; Finished Feb 2022]; currently on adjuvant durvalumab. AUG 2022- CT-improvement of the left lower lobe subpleural mass ; however noted to have increasing left-sided pleural effusion  [see below]. STABLE   # proceed with IMFINZI; Labs today reviewed;  acceptable for treatment today. MAY 2022-TSH-WNL.   # left-sided pleural effusion-monitor for now if worse would recommend diagnostic thoracentesis- CXR Sep 2022- overall STABLE.   # A. fib on Eliquis [Dr.Kowalski]-  STABLE. Mild swelling in legs- EF in Feb 2022- 55-60%; recommend BIL compression stocking/leg elevation. appt on oct 10th, 2022.   # Vaccination counselling:awaiting #3 booster; after that flu shot. ]. Will need Pneumonia shot.   # DISPOSITION:  # proceed with  IMFINZI today; # follow up in 2 weeks-MD; labs- cbc/cmp;BNP -;IMFINZI; -Dr.B

## 2021-07-02 NOTE — Patient Instructions (Signed)
[facility]  Discharge Instructions: Thank you for choosing [facility] to provide your oncology and hematology care.  If you have a lab appointment with the [facility], please go directly to the [facility] and check in at the registration area.  Wear comfortable clothing and clothing appropriate for easy access to any Portacath or PICC line.   We strive to give you quality time with your provider. You may need to reschedule your appointment if you arrive late (15 or more minutes).  Arriving late affects you and other patients whose appointments are after yours.  Also, if you miss three or more appointments without notifying the office, you may be dismissed from the clinic at the provider's discretion.      For prescription refill requests, have your pharmacy contact our office and allow 72 hours for refills to be completed.    Today you received the following chemotherapy and/or immunotherapy agents Imfinzi      To help prevent nausea and vomiting after your treatment, we encourage you to take your nausea medication as directed.  BELOW ARE SYMPTOMS THAT SHOULD BE REPORTED IMMEDIATELY: *FEVER GREATER THAN 100.4 F (38 C) OR HIGHER *CHILLS OR SWEATING *NAUSEA AND VOMITING THAT IS NOT CONTROLLED WITH YOUR NAUSEA MEDICATION *UNUSUAL SHORTNESS OF BREATH *UNUSUAL BRUISING OR BLEEDING *URINARY PROBLEMS (pain or burning when urinating, or frequent urination) *BOWEL PROBLEMS (unusual diarrhea, constipation, pain near the anus) TENDERNESS IN MOUTH AND THROAT WITH OR WITHOUT PRESENCE OF ULCERS (sore throat, sores in mouth, or a toothache) UNUSUAL RASH, SWELLING OR PAIN  UNUSUAL VAGINAL DISCHARGE OR ITCHING   Items with * indicate a potential emergency and should be followed up as soon as possible or go to the Emergency Department if any problems should occur.  Please show the CHEMOTHERAPY ALERT CARD or IMMUNOTHERAPY ALERT CARD at check-in to  the Emergency Department and triage nurse.  Should you have questions after your visit or need to cancel or reschedule your appointment, please contact [facility]  [phone] and follow the prompts.  Office hours are 8:00 a.m. to 4:30 p.m. Monday - Friday. Please note that voicemails left after 4:00 p.m. may not be returned until the following business day.  We are closed weekends and major holidays. You have access to a nurse at all times for urgent questions. Please call the main number to the clinic [phone] and follow the prompts.  For any non-urgent questions, you may also contact your provider using MyChart. We now offer e-Visits for anyone 60 and older to request care online for non-urgent symptoms. For details visit [email or web address].   Also download the MyChart app! Go to the app store, search "MyChart", open the app, select [facility], and log in with your MyChart username and password.  Due to Covid, a mask is required upon entering the hospital/clinic. If you do not have a mask, one will be given to you upon arrival. For doctor visits, patients may have 1 support person aged 57 or older with them. For treatment visits, patients cannot have anyone with them due to current Covid guidelines and our immunocompromised population.

## 2021-07-02 NOTE — Progress Notes (Signed)
[facility] NOTE  Patient Care Team: Baxter Hire, MD as PCP - General (Internal Medicine) [name], RN as Oncology Nurse Navigator [name], MD as Consulting Physician (Hematology and Oncology)  CHIEF COMPLAINTS/PURPOSE OF CONSULTATION: Lung cancer  #  Oncology History Overview Note  # NOV 2021- LEFT LOWER LOBE NON-SMALL CELL CA [favor adeno ca]; T2N3 [right hilar; subcarinal LN;[name]; NOV 2021-MRI [place]  # 11/30- carbo-Taxol-RT [RT until 10/23/20]  # Right lower lobe - ~4.6 x 3.1 cm  [PET 07-2020]- demonstrates no significant hypermetabolic activity (SUV max 1.8]-benign. STABLE.   # A.fib [Eliquis; Dr.Kowalski]  # # SURVIVORSHIP:   # GENETICS:   #NGS-ordered  DIAGNOSIS: Lung cancer  STAGE:   III      ;  GOALS:  cure  CURRENT/MOST RECENT THERAPY : carbo-Taxol-RT ]   Cancer of lower lobe of left lung ([place])  08/14/2020 Initial Diagnosis   Cancer of lower lobe of left lung ([place])   09/02/2020 - 10/22/2020 Chemotherapy    Patient is on Treatment Plan: LUNG DURVALUMAB Q14D       10/16/2020 Cancer Staging   Staging form: Lung, AJCC 8th Edition - Clinical: Stage IIIB (cT2a, cN3, cM0) - Signed by [name], MD on 10/16/2020   12/17/2020 -  Chemotherapy   Patient is on Treatment Plan : LUNG Durvalumab q14d      HISTORY OF PRESENTING ILLNESS: Walking independently but with a limp because of left hip arthritis.  He is alone.  Jason Warren 87 y.o.  male patient with stage III lung non-small cell lung-currently adjuvant duvalumab is here for follow-up.  Denies any worsening shortness of breath or cough.  Denies any worsening swelling in the legs.  No new chest pain or nausea vomiting diarrhea.  Review of Systems  Constitutional:  Negative for chills, diaphoresis, fever, malaise/fatigue and weight loss.  HENT:  Negative for nosebleeds and sore throat.   Eyes:  Negative for double vision.  Respiratory:   Negative for hemoptysis, sputum production, shortness of breath and wheezing.   Cardiovascular:  Negative for chest pain, palpitations, orthopnea and leg swelling.  Gastrointestinal:  Negative for abdominal pain, blood in stool, constipation, diarrhea, heartburn, melena, nausea and vomiting.  Genitourinary:  Negative for dysuria, frequency and urgency.  Musculoskeletal:  Negative for back pain and joint pain.  Skin:  Negative for itching.  Neurological:  Negative for tingling, focal weakness, weakness and headaches.  Endo/Heme/Allergies:  Does not bruise/bleed easily.  Psychiatric/Behavioral:  Negative for depression. The patient is not nervous/anxious and does not have insomnia.     MEDICAL HISTORY:  Past Medical History:  Diagnosis Date   Arthritis    Dysrhythmia    A-fib   Hip pain    left   HOH (hard of hearing)    LBBB (left bundle branch block) 08/05/2020   Lung cancer ([place])    Skin cancer, basal cell     face top of head    SURGICAL HISTORY: Past Surgical History:  Procedure Laterality Date   CATARACT EXTRACTION W/PHACO Right 07/13/2017   Procedure: CATARACT EXTRACTION PHACO AND INTRAOCULAR LENS PLACEMENT ([place]);  Surgeon: Leandrew Koyanagi, MD;  Location: [facility];  Service: Ophthalmology;  Laterality: Right;  IVA TOPICAL RIGHT   CATARACT EXTRACTION W/PHACO Left 01/04/2018   Procedure: CATARACT EXTRACTION PHACO AND INTRAOCULAR LENS PLACEMENT ([place]) LEFT;  Surgeon: Leandrew Koyanagi, MD;  Location: [facility];  Service: Ophthalmology;  Laterality: Left;   COLONOSCOPY  IR IMAGING GUIDED PORT INSERTION  08/27/2020   JOINT REPLACEMENT     Left total hip Dr. Su Hoff 08-04-18   ROTATOR CUFF REPAIR Right    SKIN CANCER EXCISION     face   TOTAL HIP ARTHROPLASTY Left 08/04/2018   Procedure: TOTAL HIP ARTHROPLASTY ANTERIOR APPROACH;  Surgeon: Frederik Pear, MD;  Location: [facility];  Service: Orthopedics;  Laterality: Left;   VIDEO BRONCHOSCOPY WITH  ENDOBRONCHIAL NAVIGATION N/A 08/07/2020   Procedure: VIDEO BRONCHOSCOPY WITH ENDOBRONCHIAL NAVIGATION;  Surgeon: Ottie Glazier, MD;  Location: [facility];  Service: Thoracic;  Laterality: N/A;   VIDEO BRONCHOSCOPY WITH ENDOBRONCHIAL ULTRASOUND N/A 08/07/2020   Procedure: VIDEO BRONCHOSCOPY WITH ENDOBRONCHIAL ULTRASOUND;  Surgeon: Ottie Glazier, MD;  Location: [facility];  Service: Thoracic;  Laterality: N/A;    SOCIAL HISTORY: Social History   Socioeconomic History   Marital status: Married    Spouse name: Not on file   Number of children: Not on file   Years of education: Not on file   Highest education level: Not on file  Occupational History   Not on file  Tobacco Use   Smoking status: Former    Packs/day: 1.00    Years: 4.00    Pack years: 4.00    Types: Cigarettes   Smokeless tobacco: Never   Tobacco comments:    quit early 70's  Vaping Use   Vaping Use: Never used  Substance and Sexual Activity   Alcohol use: No   Drug use: No   Sexual activity: Not Currently  Other Topics Concern   Not on file  Social History Narrative   > quit 35 years; smoked for 15 years. Rare alcohol. 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    Social Determinants of Health   Financial Resource Strain: Not on file  Food Insecurity: Not on file  Transportation Needs: Not on file  Physical Activity: Not on file  Stress: Not on file  Social Connections: Not on file  Intimate Partner Violence: Not on file    FAMILY HISTORY: Family History  Problem Relation Age of Onset   Throat cancer Brother         & lung cancer    ALLERGIES:  has No Known Allergies.  MEDICATIONS:  Current Outpatient Medications  Medication Sig Dispense Refill   acetaminophen (TYLENOL) 500 MG tablet Take 500 mg by mouth every 8 (eight) hours as needed for moderate pain.     apixaban (ELIQUIS) 5 MG TABS tablet Take 5 mg by mouth 2 (two) times daily.      hydroxypropyl methylcellulose / hypromellose (ISOPTO TEARS / GONIOVISC) 2.5 % ophthalmic solution Place 1 drop into the left eye daily as needed for dry eyes.     lidocaine-prilocaine (EMLA) cream Apply 1 application topically as needed. Apply small amount to port site at least 1 hour prior to it being accessed, cover with plastic wrap 30 g 1   Multiple Vitamin (MULTIVITAMIN) tablet Take 1 tablet by mouth daily.     Polyethyl Glycol-Propyl Glycol (SYSTANE FREE OP) Apply 1 drop to eye as needed.     pravastatin (PRAVACHOL) 80 MG tablet Take 80 mg by mouth at bedtime.      saw palmetto 160 MG capsule Take 160 mg by mouth 2 (two) times daily.     No current facility-administered medications for this visit.   Facility-Administered Medications Ordered in Other Visits  Medication Dose Route  Frequency Provider Last Rate Last Admin   sodium chloride flush (NS) 0.9 % injection 10 mL  10 mL Intravenous PRN [name], MD   10 mL at 02/11/21 0835   PHYSICAL EXAMINATION: ECOG PERFORMANCE STATUS: 1 - Symptomatic but completely ambulatory  Vitals:   07/02/21 0919  BP: 120/67  Pulse: 69  Resp: 20  Temp: 97.8 F (36.6 C)   Filed Weights   07/02/21 0919  Weight: 164 lb 9.6 oz (74.7 kg)    Physical Exam HENT:     Head: Normocephalic and atraumatic.     Mouth/Throat:     Pharynx: No oropharyngeal exudate.  Eyes:     Pupils: Pupils are equal, round, and reactive to light.  Cardiovascular:     Rate and Rhythm: Normal rate and regular rhythm.  Pulmonary:     Effort: Pulmonary effort is normal. No respiratory distress.     Breath sounds: Normal breath sounds. No wheezing.  Abdominal:     General: Bowel sounds are normal. There is no distension.     Palpations: Abdomen is soft. There is no mass.     Tenderness: There is no abdominal tenderness. There is no guarding or rebound.  Musculoskeletal:        General: No tenderness. Normal range of motion.     Cervical back: Normal range of  motion and neck supple.  Skin:    General: Skin is warm.  Neurological:     Mental Status: He is alert and oriented to person, place, and time.  Psychiatric:        Mood and Affect: Affect normal.     LABORATORY DATA:  I have reviewed the data as listed Lab Results  Component Value Date   WBC 4.8 07/02/2021   HGB 13.9 07/02/2021   HCT 41.8 07/02/2021   MCV 95.9 07/02/2021   PLT 210 07/02/2021   Recent Labs    06/04/21 0806 06/18/21 0804 07/02/21 0855  NA 137 138 138  K 4.3 4.1 4.3  CL 104 105 105  CO2 24 25 26   GLUCOSE 124* 123* 100*  BUN 17 21 18   CREATININE 0.97 1.05 1.04  CALCIUM 8.8* 9.0 9.0  GFRNONAA >60 >60 >60  PROT 6.9 7.2 7.3  ALBUMIN 3.8 3.8 3.9  AST 31 28 28   ALT 20 14 16   ALKPHOS 121 112 125  BILITOT 0.6 0.9 0.8    RADIOGRAPHIC STUDIES: I have personally reviewed the radiological images as listed and agreed with the findings in the report. DG Chest 2 View  Result Date: 06/18/2021 CLINICAL DATA:  Cancer of lower lobe of left lung. Recurrent left pleural effusion. Left pleural effusion. EXAM: CHEST - 2 VIEW COMPARISON:  CT chest 05/20/2021.  Chest radiograph 02/11/2021. FINDINGS: Unchanged position of a right chest infusion port catheter with tip projecting at the level of the lower SVC. Heart size within normal limits. Aortic atherosclerosis. Scarring and airspace opacities within the mid lungs bilaterally, not appreciably changed from the chest radiograph of 02/11/2021. A small left pleural effusion is similar to minimally increased in size as compared to this prior examination. Trace right pleural effusion. No evidence of pneumothorax. No acute bony abnormality identified. IMPRESSION: Small left pleural effusion, similar to minimally increased in size from the chest CT of 05/20/2021 and from the chest radiograph of 02/11/2021. A trace right pleural effusion is also present. Scarring and airspace opacities within the mid lungs bilaterally, not appreciably  changed from the prior chest radiograph of 02/11/2021.  Aortic Atherosclerosis (ICD10-I70.0). Electronically Signed   By: Kellie Simmering D.O.   On: 06/18/2021 12:11     ASSESSMENT & PLAN:   Cancer of lower lobe of left lung ([place]) # T2N3- Stage III-non-small cell lung cancer-[favor adenocarcinoma]- s/p  concurrent chemoradiation; Finished Feb 2022]; currently on adjuvant durvalumab. AUG 2022- CT-improvement of the left lower lobe subpleural mass ; however noted to have increasing left-sided pleural effusion  [see below]. STABLE   # proceed with IMFINZI; Labs today reviewed;  acceptable for treatment today. MAY 2022-TSH-WNL.   # left-sided pleural effusion-monitor for now if worse would recommend diagnostic thoracentesis- CXR Sep 2022- overall STABLE.   # A. fib on Eliquis [Dr.Kowalski]-  STABLE. Mild swelling in legs- EF in Feb 2022- 55-60%; recommend BIL compression stocking/leg elevation. appt on oct 10th, 2022.   # Vaccination counselling:awaiting #3 booster; after that flu shot. ]. Will need Pneumonia shot.   # DISPOSITION:  # proceed with  IMFINZI today; # follow up in 2 weeks-MD; labs- cbc/cmp;BNP -;IMFINZI; -Dr.B  All questions were answered. The patient knows to call the clinic with any problems, questions or concerns.   [name], MD 07/15/2021 10:51 PM

## 2021-07-02 NOTE — Progress Notes (Signed)
Patient here for results no complaints today.

## 2021-07-15 ENCOUNTER — Encounter: Payer: Self-pay | Admitting: Internal Medicine

## 2021-07-16 ENCOUNTER — Inpatient Hospital Stay: Payer: Medicare Other

## 2021-07-16 ENCOUNTER — Inpatient Hospital Stay (HOSPITAL_BASED_OUTPATIENT_CLINIC_OR_DEPARTMENT_OTHER): Payer: Medicare Other | Admitting: Internal Medicine

## 2021-07-16 ENCOUNTER — Inpatient Hospital Stay: Payer: Medicare Other | Attending: Internal Medicine

## 2021-07-16 ENCOUNTER — Encounter: Payer: Self-pay | Admitting: Internal Medicine

## 2021-07-16 ENCOUNTER — Other Ambulatory Visit: Payer: Self-pay

## 2021-07-16 DIAGNOSIS — Z85828 Personal history of other malignant neoplasm of skin: Secondary | ICD-10-CM | POA: Insufficient documentation

## 2021-07-16 DIAGNOSIS — I4891 Unspecified atrial fibrillation: Secondary | ICD-10-CM | POA: Insufficient documentation

## 2021-07-16 DIAGNOSIS — C3432 Malignant neoplasm of lower lobe, left bronchus or lung: Secondary | ICD-10-CM | POA: Diagnosis present

## 2021-07-16 DIAGNOSIS — I700 Atherosclerosis of aorta: Secondary | ICD-10-CM | POA: Insufficient documentation

## 2021-07-16 DIAGNOSIS — M1612 Unilateral primary osteoarthritis, left hip: Secondary | ICD-10-CM | POA: Diagnosis not present

## 2021-07-16 DIAGNOSIS — Z79899 Other long term (current) drug therapy: Secondary | ICD-10-CM | POA: Diagnosis not present

## 2021-07-16 DIAGNOSIS — R600 Localized edema: Secondary | ICD-10-CM | POA: Diagnosis not present

## 2021-07-16 DIAGNOSIS — Z7901 Long term (current) use of anticoagulants: Secondary | ICD-10-CM | POA: Insufficient documentation

## 2021-07-16 DIAGNOSIS — J90 Pleural effusion, not elsewhere classified: Secondary | ICD-10-CM | POA: Insufficient documentation

## 2021-07-16 DIAGNOSIS — R0602 Shortness of breath: Secondary | ICD-10-CM

## 2021-07-16 DIAGNOSIS — Z5112 Encounter for antineoplastic immunotherapy: Secondary | ICD-10-CM | POA: Diagnosis present

## 2021-07-16 LAB — CBC WITH DIFFERENTIAL/PLATELET
Abs Immature Granulocytes: 0.01 10*3/uL (ref 0.00–0.07)
Basophils Absolute: 0 10*3/uL (ref 0.0–0.1)
Basophils Relative: 0 %
Eosinophils Absolute: 0.1 10*3/uL (ref 0.0–0.5)
Eosinophils Relative: 1 %
HCT: 39.6 % (ref 39.0–52.0)
Hemoglobin: 13 g/dL (ref 13.0–17.0)
Immature Granulocytes: 0 %
Lymphocytes Relative: 11 %
Lymphs Abs: 0.7 10*3/uL (ref 0.7–4.0)
MCH: 31.5 pg (ref 26.0–34.0)
MCHC: 32.8 g/dL (ref 30.0–36.0)
MCV: 95.9 fL (ref 80.0–100.0)
Monocytes Absolute: 0.5 10*3/uL (ref 0.1–1.0)
Monocytes Relative: 9 %
Neutro Abs: 4.9 10*3/uL (ref 1.7–7.7)
Neutrophils Relative %: 79 %
Platelets: 210 10*3/uL (ref 150–400)
RBC: 4.13 MIL/uL — ABNORMAL LOW (ref 4.22–5.81)
RDW: 13.2 % (ref 11.5–15.5)
WBC: 6.2 10*3/uL (ref 4.0–10.5)
nRBC: 0 % (ref 0.0–0.2)

## 2021-07-16 LAB — COMPREHENSIVE METABOLIC PANEL
ALT: 17 U/L (ref 0–44)
AST: 30 U/L (ref 15–41)
Albumin: 3.9 g/dL (ref 3.5–5.0)
Alkaline Phosphatase: 117 U/L (ref 38–126)
Anion gap: 9 (ref 5–15)
BUN: 21 mg/dL (ref 8–23)
CO2: 24 mmol/L (ref 22–32)
Calcium: 8.7 mg/dL — ABNORMAL LOW (ref 8.9–10.3)
Chloride: 101 mmol/L (ref 98–111)
Creatinine, Ser: 0.89 mg/dL (ref 0.61–1.24)
GFR, Estimated: >60 mL/min (ref >60)
Glucose, Bld: 179 mg/dL — ABNORMAL HIGH (ref 70–99)
Potassium: 3.9 mmol/L (ref 3.5–5.1)
Sodium: 134 mmol/L — ABNORMAL LOW (ref 135–145)
Total Bilirubin: 1.2 mg/dL (ref 0.3–1.2)
Total Protein: 7.2 g/dL (ref 6.5–8.1)

## 2021-07-16 LAB — BRAIN NATRIURETIC PEPTIDE: B Natriuretic Peptide: 387.3 pg/mL — ABNORMAL HIGH (ref 0.0–100.0)

## 2021-07-16 MED ORDER — HEPARIN SOD (PORK) LOCK FLUSH 100 UNIT/ML IV SOLN
INTRAVENOUS | Status: AC
  Administered 2021-07-16: 500 [IU]
  Filled 2021-07-16: qty 5

## 2021-07-16 MED ORDER — HEPARIN SOD (PORK) LOCK FLUSH 100 UNIT/ML IV SOLN
500.0000 [IU] | Freq: Once | INTRAVENOUS | Status: AC | PRN
  Filled 2021-07-16: qty 5

## 2021-07-16 MED ORDER — SODIUM CHLORIDE 0.9 % IV SOLN
Freq: Once | INTRAVENOUS | Status: AC
  Filled 2021-07-16: qty 250

## 2021-07-16 MED ORDER — SODIUM CHLORIDE 0.9 % IV SOLN
10.0000 mg/kg | Freq: Once | INTRAVENOUS | Status: AC
  Administered 2021-07-16: 740 mg via INTRAVENOUS
  Filled 2021-07-16: qty 10

## 2021-07-16 NOTE — Patient Instructions (Signed)
[facility] ONCOLOGY   Discharge Instructions: Thank you for choosing [facility] to provide your oncology and hematology care.  If you have a lab appointment with the [facility], please go directly to the [facility] and check in at the registration area.  Wear comfortable clothing and clothing appropriate for easy access to any Portacath or PICC line.   We strive to give you quality time with your provider. You may need to reschedule your appointment if you arrive late (15 or more minutes).  Arriving late affects you and other patients whose appointments are after yours.  Also, if you miss three or more appointments without notifying the office, you may be dismissed from the clinic at the provider's discretion.      For prescription refill requests, have your pharmacy contact our office and allow 72 hours for refills to be completed.    Today you received the following chemotherapy and/or immunotherapy agents: Imfinzi.      To help prevent nausea and vomiting after your treatment, we encourage you to take your nausea medication as directed.  BELOW ARE SYMPTOMS THAT SHOULD BE REPORTED IMMEDIATELY: *FEVER GREATER THAN 100.4 F (38 C) OR HIGHER *CHILLS OR SWEATING *NAUSEA AND VOMITING THAT IS NOT CONTROLLED WITH YOUR NAUSEA MEDICATION *UNUSUAL SHORTNESS OF BREATH *UNUSUAL BRUISING OR BLEEDING *URINARY PROBLEMS (pain or burning when urinating, or frequent urination) *BOWEL PROBLEMS (unusual diarrhea, constipation, pain near the anus) TENDERNESS IN MOUTH AND THROAT WITH OR WITHOUT PRESENCE OF ULCERS (sore throat, sores in mouth, or a toothache) UNUSUAL RASH, SWELLING OR PAIN  UNUSUAL VAGINAL DISCHARGE OR ITCHING   Items with * indicate a potential emergency and should be followed up as soon as possible or go to the Emergency Department if any problems should occur.  Please show the CHEMOTHERAPY ALERT CARD or IMMUNOTHERAPY ALERT CARD at check-in  to the Emergency Department and triage nurse.  Should you have questions after your visit or need to cancel or reschedule your appointment, please contact [facility]  [phone] and follow the prompts.  Office hours are 8:00 a.m. to 4:30 p.m. Monday - Friday. Please note that voicemails left after 4:00 p.m. may not be returned until the following business day.  We are closed weekends and major holidays. You have access to a nurse at all times for urgent questions. Please call the main number to the clinic [phone] and follow the prompts.  For any non-urgent questions, you may also contact your provider using MyChart. We now offer e-Visits for anyone 19 and older to request care online for non-urgent symptoms. For details visit [email or web address].   Also download the MyChart app! Go to the app store, search "MyChart", open the app, select [facility], and log in with your MyChart username and password.  Due to Covid, a mask is required upon entering the hospital/clinic. If you do not have a mask, one will be given to you upon arrival. For doctor visits, patients may have 1 support person aged 79 or older with them. For treatment visits, patients cannot have anyone with them due to current Covid guidelines and our immunocompromised population.

## 2021-07-16 NOTE — Progress Notes (Signed)
[name] NOTE  Patient Care Team: Baxter Hire, MD as PCP - General (Internal Medicine) [name], RN as Oncology Nurse Navigator [name], MD as Consulting Physician (Hematology and Oncology)  CHIEF COMPLAINTS/PURPOSE OF CONSULTATION: Lung cancer  #  Oncology History Overview Note  # NOV 2021- LEFT LOWER LOBE NON-SMALL CELL CA [favor adeno ca]; T2N3 [right hilar; subcarinal LN;[name]; NOV 2021-MRI [facility]  # 11/30- carbo-Taxol-RT [RT until 10/23/20]  # Right lower lobe - ~4.6 x 3.1 cm  [PET 07-2020]- demonstrates no significant hypermetabolic activity (SUV max 1.8]-benign. STABLE.   # A.fib [Eliquis; Dr.Kowalski]  # # SURVIVORSHIP:   # GENETICS:   #NGS-ordered  DIAGNOSIS: Lung cancer  STAGE:   III      ;  GOALS:  cure  CURRENT/MOST RECENT THERAPY : carbo-Taxol-RT ]   Cancer of lower lobe of left lung ([place])  08/14/2020 Initial Diagnosis   Cancer of lower lobe of left lung ([place])   09/02/2020 - 10/22/2020 Chemotherapy    Patient is on Treatment Plan: LUNG DURVALUMAB Q14D       10/16/2020 Cancer Staging   Staging form: Lung, AJCC 8th Edition - Clinical: Stage IIIB (cT2a, cN3, cM0) - Signed by [name], MD on 10/16/2020   12/17/2020 -  Chemotherapy   Patient is on Treatment Plan : LUNG Durvalumab q14d      HISTORY OF PRESENTING ILLNESS: Walking independently but with a limp because of left hip arthritis.  He is alone.  Jason Warren 87 y.o.  male patient with stage III lung non-small cell lung-currently adjuvant duvalumab is here for follow-up.  In the interim patient was evaluated by cardiology.  Denies any worsening shortness of breath or cough.  Denies any worsening swelling in the legs.  No new chest pain or nausea vomiting diarrhea.  Review of Systems  Constitutional:  Negative for chills, diaphoresis, fever, malaise/fatigue and weight loss.  HENT:  Negative for nosebleeds and sore throat.    Eyes:  Negative for double vision.  Respiratory:  Negative for hemoptysis, sputum production, shortness of breath and wheezing.   Cardiovascular:  Negative for chest pain, palpitations, orthopnea and leg swelling.  Gastrointestinal:  Negative for abdominal pain, blood in stool, constipation, diarrhea, heartburn, melena, nausea and vomiting.  Genitourinary:  Negative for dysuria, frequency and urgency.  Musculoskeletal:  Negative for back pain and joint pain.  Skin:  Negative for itching.  Neurological:  Negative for tingling, focal weakness, weakness and headaches.  Endo/Heme/Allergies:  Does not bruise/bleed easily.  Psychiatric/Behavioral:  Negative for depression. The patient is not nervous/anxious and does not have insomnia.     MEDICAL HISTORY:  Past Medical History:  Diagnosis Date  . Arthritis   . Dysrhythmia    A-fib  . Hip pain    left  . HOH (hard of hearing)   . LBBB (left bundle branch block) 08/05/2020  . Lung cancer ([place])   . Skin cancer, basal cell     face top of head    SURGICAL HISTORY: Past Surgical History:  Procedure Laterality Date  . CATARACT EXTRACTION W/PHACO Right 07/13/2017   Procedure: CATARACT EXTRACTION PHACO AND INTRAOCULAR LENS PLACEMENT ([place]);  Surgeon: Leandrew Koyanagi, MD;  Location: [facility];  Service: Ophthalmology;  Laterality: Right;  IVA TOPICAL RIGHT  . CATARACT EXTRACTION W/PHACO Left 01/04/2018   Procedure: CATARACT EXTRACTION PHACO AND INTRAOCULAR LENS PLACEMENT ([place]) LEFT;  Surgeon: Leandrew Koyanagi, MD;  Location: [facility];  Service: Ophthalmology;  Laterality: Left;  . COLONOSCOPY    . IR IMAGING GUIDED PORT INSERTION  08/27/2020  . JOINT REPLACEMENT     Left total hip Dr. Su Hoff 08-04-18  . ROTATOR CUFF REPAIR Right   . SKIN CANCER EXCISION     face  . TOTAL HIP ARTHROPLASTY Left 08/04/2018   Procedure: TOTAL HIP ARTHROPLASTY ANTERIOR APPROACH;  Surgeon: Frederik Pear, MD;  [facility];   Service: Orthopedics;  Laterality: Left;  Marland Kitchen VIDEO BRONCHOSCOPY WITH ENDOBRONCHIAL NAVIGATION N/A 08/07/2020   Procedure: VIDEO BRONCHOSCOPY WITH ENDOBRONCHIAL NAVIGATION;  Surgeon: Ottie Glazier, MD;  Location: [facility];  Service: Thoracic;  Laterality: N/A;  . VIDEO BRONCHOSCOPY WITH ENDOBRONCHIAL ULTRASOUND N/A 08/07/2020   Procedure: VIDEO BRONCHOSCOPY WITH ENDOBRONCHIAL ULTRASOUND;  Surgeon: Ottie Glazier, MD;  Location: [facility];  Service: Thoracic;  Laterality: N/A;    SOCIAL HISTORY: Social History   Socioeconomic History  . Marital status: Married    Spouse name: Not on file  . Number of children: Not on file  . Years of education: Not on file  . Highest education level: Not on file  Occupational History  . Not on file  Tobacco Use  . Smoking status: Former    Packs/day: 1.00    Years: 4.00    Pack years: 4.00    Types: Cigarettes  . Smokeless tobacco: Never  . Tobacco comments:    quit early 70's  Vaping Use  . Vaping Use: Never used  Substance and Sexual Activity  . Alcohol use: No  . Drug use: No  . Sexual activity: Not Currently  Other Topics Concern  . Not on file  Social History Narrative   > quit 35 years; smoked for 15 years. Rare alcohol. 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    Social Determinants of Health   Financial Resource Strain: Not on file  Food Insecurity: Not on file  Transportation Needs: Not on file  Physical Activity: Not on file  Stress: Not on file  Social Connections: Not on file  Intimate Partner Violence: Not on file    FAMILY HISTORY: Family History  Problem Relation Age of Onset  . Throat cancer Brother         & lung cancer    ALLERGIES:  has No Known Allergies.  MEDICATIONS:  Current Outpatient Medications  Medication Sig Dispense Refill  . acetaminophen (TYLENOL) 500 MG tablet Take 500 mg by mouth every 8 (eight) hours as needed for moderate pain.     Marland Kitchen apixaban (ELIQUIS) 5 MG TABS tablet Take 5 mg by mouth 2 (two) times daily.    . hydroxypropyl methylcellulose / hypromellose (ISOPTO TEARS / GONIOVISC) 2.5 % ophthalmic solution Place 1 drop into the left eye daily as needed for dry eyes.    Marland Kitchen lidocaine-prilocaine (EMLA) cream Apply 1 application topically as needed. Apply small amount to port site at least 1 hour prior to it being accessed, cover with plastic wrap 30 g 1  . Multiple Vitamin (MULTIVITAMIN) tablet Take 1 tablet by mouth daily.    Vladimir Faster Glycol-Propyl Glycol (SYSTANE FREE OP) Apply 1 drop to eye as needed.    . pravastatin (PRAVACHOL) 80 MG tablet Take 80 mg by mouth at bedtime.     . saw palmetto 160 MG capsule Take 160 mg by mouth 2 (two) times daily.     No current facility-administered medications for this visit.   Facility-Administered  Medications Ordered in Other Visits  Medication Dose Route Frequency Provider Last Rate Last Admin  . durvalumab (IMFINZI) 740 mg in sodium chloride 0.9 % 100 mL chemo infusion  10 mg/kg (Order-Specific) Intravenous Once Charlaine Dalton R, MD      . heparin lock flush 100 unit/mL  500 Units Intracatheter Once PRN Charlaine Dalton R, MD      . sodium chloride flush (NS) 0.9 % injection 10 mL  10 mL Intravenous PRN [name], MD   10 mL at 02/11/21 0835   PHYSICAL EXAMINATION: ECOG PERFORMANCE STATUS: 1 - Symptomatic but completely ambulatory  Vitals:   07/16/21 0826  BP: 114/63  Pulse: 75  Resp: 12  Temp: (!) 97 F (36.1 C)   Filed Weights   07/16/21 0826  Weight: 164 lb 14.4 oz (74.8 kg)    Physical Exam HENT:     Head: Normocephalic and atraumatic.     Mouth/Throat:     Pharynx: No oropharyngeal exudate.  Eyes:     Pupils: Pupils are equal, round, and reactive to light.  Cardiovascular:     Rate and Rhythm: Normal rate and regular rhythm.  Pulmonary:     Effort: Pulmonary effort is normal. No respiratory distress.     Breath sounds: No  wheezing.     Comments: Slightly decreased breath sounds left lower lung base. Abdominal:     General: Bowel sounds are normal. There is no distension.     Palpations: Abdomen is soft. There is no mass.     Tenderness: There is no abdominal tenderness. There is no guarding or rebound.  Musculoskeletal:        General: No tenderness. Normal range of motion.     Cervical back: Normal range of motion and neck supple.  Skin:    General: Skin is warm.  Neurological:     Mental Status: He is alert and oriented to person, place, and time.  Psychiatric:        Mood and Affect: Affect normal.     LABORATORY DATA:  I have reviewed the data as listed Lab Results  Component Value Date   WBC 6.2 07/16/2021   HGB 13.0 07/16/2021   HCT 39.6 07/16/2021   MCV 95.9 07/16/2021   PLT 210 07/16/2021   Recent Labs    06/18/21 0804 07/02/21 0855 07/16/21 0804  NA 138 138 134*  K 4.1 4.3 3.9  CL 105 105 101  CO2 25 26 24   GLUCOSE 123* 100* 179*  BUN 21 18 21   CREATININE 1.05 1.04 0.89  CALCIUM 9.0 9.0 8.7*  GFRNONAA >60 >60 >60  PROT 7.2 7.3 7.2  ALBUMIN 3.8 3.9 3.9  AST 28 28 30   ALT 14 16 17   ALKPHOS 112 125 117  BILITOT 0.9 0.8 1.2    RADIOGRAPHIC STUDIES: I have personally reviewed the radiological images as listed and agreed with the findings in the report. DG Chest 2 View  Result Date: 06/18/2021 CLINICAL DATA:  Cancer of lower lobe of left lung. Recurrent left pleural effusion. Left pleural effusion. EXAM: CHEST - 2 VIEW COMPARISON:  CT chest 05/20/2021.  Chest radiograph 02/11/2021. FINDINGS: Unchanged position of a right chest infusion port catheter with tip projecting at the level of the lower SVC. Heart size within normal limits. Aortic atherosclerosis. Scarring and airspace opacities within the mid lungs bilaterally, not appreciably changed from the chest radiograph of 02/11/2021. A small left pleural effusion is similar to minimally increased in size as compared  to this  prior examination. Trace right pleural effusion. No evidence of pneumothorax. No acute bony abnormality identified. IMPRESSION: Small left pleural effusion, similar to minimally increased in size from the chest CT of 05/20/2021 and from the chest radiograph of 02/11/2021. A trace right pleural effusion is also present. Scarring and airspace opacities within the mid lungs bilaterally, not appreciably changed from the prior chest radiograph of 02/11/2021. Aortic Atherosclerosis (ICD10-I70.0). Electronically Signed   By: Kellie Simmering D.O.   On: 06/18/2021 12:11     ASSESSMENT & PLAN:   Cancer of lower lobe of left lung ([place]) # T2N3- Stage III-non-small cell lung cancer-[favor adenocarcinoma]- s/p  concurrent chemoradiation; Finished Feb 2022]; currently on adjuvant durvalumab. AUG 2022- CT-improvement of the left lower lobe subpleural mass ; however noted to have increasing left-sided pleural effusion  [see below]. STABLE   # proceed with IMFINZI; Labs today reviewed;  acceptable for treatment today. MAY 2022-TSH-WNL.   # left-sided pleural effusion-monitor for now if worse would recommend diagnostic thoracentesis- CXR Sep 2022- overall -stable.  # A. fib on Eliquis [Dr.Kowalski]-  Mild swelling in legs- EF in Feb 2022- 55-60%; recommend BIL compression stocking/leg elevation. appt on oct 10th, 2022- STABLE.   # Vaccination counselling:s/p  #3 booster S/p  Pneumonia shot; awaiting  flu shot. ].  # DISPOSITION:  # proceed with  IMFINZI today; # follow up in 2 weeks-MD; labs- cbc/cmp;BNP -;IMFINZI; -Dr.B  All questions were answered. The patient knows to call the clinic with any problems, questions or concerns.   [name], MD 07/16/2021 9:21 AM

## 2021-07-16 NOTE — Assessment & Plan Note (Signed)
#   T2N3- Stage III-non-small cell lung cancer-[favor adenocarcinoma]- s/p  concurrent chemoradiation; Finished Feb 2022]; currently on adjuvant durvalumab. AUG 2022- CT-improvement of the left lower lobe subpleural mass ; however noted to have increasing left-sided pleural effusion  [see below]. STABLE   # proceed with IMFINZI; Labs today reviewed;  acceptable for treatment today. MAY 2022-TSH-WNL.   # left-sided pleural effusion-monitor for now if worse would recommend diagnostic thoracentesis- CXR Sep 2022- overall -stable.  # A. fib on Eliquis [Dr.Kowalski]-  Mild swelling in legs- EF in Feb 2022- 55-60%; recommend BIL compression stocking/leg elevation. appt on oct 10th, 2022- STABLE.   # Vaccination counselling:s/p  #3 booster S/p  Pneumonia shot; awaiting  flu shot. ].  # DISPOSITION:  # proceed with  IMFINZI today; # follow up in 2 weeks-MD; labs- cbc/cmp;BNP -;IMFINZI; -Dr.B

## 2021-07-16 NOTE — Progress Notes (Signed)
Patient here for pre treatment check he has no complaints today.

## 2021-07-30 ENCOUNTER — Inpatient Hospital Stay: Payer: Medicare Other

## 2021-07-30 ENCOUNTER — Inpatient Hospital Stay (HOSPITAL_BASED_OUTPATIENT_CLINIC_OR_DEPARTMENT_OTHER): Payer: Medicare Other | Admitting: Internal Medicine

## 2021-07-30 ENCOUNTER — Encounter: Payer: Self-pay | Admitting: Internal Medicine

## 2021-07-30 ENCOUNTER — Other Ambulatory Visit: Payer: Self-pay

## 2021-07-30 DIAGNOSIS — C3432 Malignant neoplasm of lower lobe, left bronchus or lung: Secondary | ICD-10-CM | POA: Diagnosis not present

## 2021-07-30 DIAGNOSIS — Z5112 Encounter for antineoplastic immunotherapy: Secondary | ICD-10-CM | POA: Diagnosis not present

## 2021-07-30 DIAGNOSIS — R0602 Shortness of breath: Secondary | ICD-10-CM

## 2021-07-30 LAB — CBC WITH DIFFERENTIAL/PLATELET
Abs Immature Granulocytes: 0.02 10*3/uL (ref 0.00–0.07)
Basophils Absolute: 0 10*3/uL (ref 0.0–0.1)
Basophils Relative: 0 %
Eosinophils Absolute: 0.2 10*3/uL (ref 0.0–0.5)
Eosinophils Relative: 2 %
HCT: 41.5 % (ref 39.0–52.0)
Hemoglobin: 14 g/dL (ref 13.0–17.0)
Immature Granulocytes: 0 %
Lymphocytes Relative: 18 %
Lymphs Abs: 1.2 10*3/uL (ref 0.7–4.0)
MCH: 32.3 pg (ref 26.0–34.0)
MCHC: 33.7 g/dL (ref 30.0–36.0)
MCV: 95.6 fL (ref 80.0–100.0)
Monocytes Absolute: 0.6 10*3/uL (ref 0.1–1.0)
Monocytes Relative: 9 %
Neutro Abs: 4.8 10*3/uL (ref 1.7–7.7)
Neutrophils Relative %: 71 %
Platelets: 234 10*3/uL (ref 150–400)
RBC: 4.34 MIL/uL (ref 4.22–5.81)
RDW: 13.7 % (ref 11.5–15.5)
WBC: 6.8 10*3/uL (ref 4.0–10.5)
nRBC: 0 % (ref 0.0–0.2)

## 2021-07-30 LAB — BRAIN NATRIURETIC PEPTIDE: B Natriuretic Peptide: 510.7 pg/mL — ABNORMAL HIGH (ref 0.0–100.0)

## 2021-07-30 LAB — COMPREHENSIVE METABOLIC PANEL
ALT: 27 U/L (ref 0–44)
AST: 30 U/L (ref 15–41)
Albumin: 4.3 g/dL (ref 3.5–5.0)
Alkaline Phosphatase: 107 U/L (ref 38–126)
Anion gap: 9 (ref 5–15)
BUN: 26 mg/dL — ABNORMAL HIGH (ref 8–23)
CO2: 25 mmol/L (ref 22–32)
Calcium: 9.1 mg/dL (ref 8.9–10.3)
Chloride: 103 mmol/L (ref 98–111)
Creatinine, Ser: 0.87 mg/dL (ref 0.61–1.24)
GFR, Estimated: >60 mL/min (ref >60)
Glucose, Bld: 129 mg/dL — ABNORMAL HIGH (ref 70–99)
Potassium: 4.1 mmol/L (ref 3.5–5.1)
Sodium: 137 mmol/L (ref 135–145)
Total Bilirubin: 0.9 mg/dL (ref 0.3–1.2)
Total Protein: 7.3 g/dL (ref 6.5–8.1)

## 2021-07-30 MED ORDER — SODIUM CHLORIDE 0.9% FLUSH
10.0000 mL | INTRAVENOUS | Status: DC | PRN
  Filled 2021-07-30: qty 10

## 2021-07-30 MED ORDER — SODIUM CHLORIDE 0.9% FLUSH
10.0000 mL | Freq: Once | INTRAVENOUS | Status: AC
  Administered 2021-07-30: 10 mL via INTRAVENOUS
  Filled 2021-07-30: qty 10

## 2021-07-30 MED ORDER — HEPARIN SOD (PORK) LOCK FLUSH 100 UNIT/ML IV SOLN
500.0000 [IU] | Freq: Once | INTRAVENOUS | Status: AC | PRN
  Filled 2021-07-30: qty 5

## 2021-07-30 MED ORDER — SODIUM CHLORIDE 0.9 % IV SOLN
10.0000 mg/kg | Freq: Once | INTRAVENOUS | Status: AC
  Administered 2021-07-30: 740 mg via INTRAVENOUS
  Filled 2021-07-30: qty 10

## 2021-07-30 MED ORDER — HEPARIN SOD (PORK) LOCK FLUSH 100 UNIT/ML IV SOLN
INTRAVENOUS | Status: AC
  Administered 2021-07-30: 500 [IU]
  Filled 2021-07-30: qty 5

## 2021-07-30 MED ORDER — SODIUM CHLORIDE 0.9 % IV SOLN
Freq: Once | INTRAVENOUS | Status: AC
  Filled 2021-07-30: qty 250

## 2021-07-30 NOTE — Progress Notes (Signed)
[facility] NOTE  Patient Care Team: Baxter Hire, MD as PCP - General (Internal Medicine) [name], RN as Oncology Nurse Navigator [name], MD as Consulting Physician (Hematology and Oncology)  CHIEF COMPLAINTS/PURPOSE OF CONSULTATION: Lung cancer  #  Oncology History Overview Note  # NOV 2021- LEFT LOWER LOBE NON-SMALL CELL CA [favor adeno ca]; T2N3 [right hilar; subcarinal LN;[name]; NOV 2021-MRI [facility]  # 11/30- carbo-Taxol-RT [RT until 10/23/20]  # Right lower lobe - ~4.6 x 3.1 cm  [PET 07-2020]- demonstrates no significant hypermetabolic activity (SUV max 1.8]-benign. STABLE.   # A.fib [Eliquis; Dr.Kowalski]  # # SURVIVORSHIP:   # GENETICS:   #NGS-ordered  DIAGNOSIS: Lung cancer  STAGE:   III      ;  GOALS:  cure  CURRENT/MOST RECENT THERAPY : carbo-Taxol-RT ]   Cancer of lower lobe of left lung ([place])  08/14/2020 Initial Diagnosis   Cancer of lower lobe of left lung ([place])   09/02/2020 - 10/22/2020 Chemotherapy    Patient is on Treatment Plan: LUNG DURVALUMAB Q14D       10/16/2020 Cancer Staging   Staging form: Lung, AJCC 8th Edition - Clinical: Stage IIIB (cT2a, cN3, cM0) - Signed by [name], MD on 10/16/2020    12/17/2020 -  Chemotherapy   Patient is on Treatment Plan : LUNG Durvalumab q14d      HISTORY OF PRESENTING ILLNESS: Walking independently but with a limp because of left hip arthritis.  He is alone.  Jason Warren 87 y.o.  male patient with stage III lung non-small cell lung-currently adjuvant duvalumab is here for follow-up.  In the interim patient had a steroid injection of his left shoulder.  Patient denies any shortness of breath or cough. Denies any worsening swelling in the legs-improved on compression stockings..  No new chest pain or nausea vomiting diarrhea.  Review of Systems  Constitutional:  Negative for chills, diaphoresis, fever, malaise/fatigue and weight loss.   HENT:  Negative for nosebleeds and sore throat.   Eyes:  Negative for double vision.  Respiratory:  Negative for hemoptysis, sputum production, shortness of breath and wheezing.   Cardiovascular:  Negative for chest pain, palpitations, orthopnea and leg swelling.  Gastrointestinal:  Negative for abdominal pain, blood in stool, constipation, diarrhea, heartburn, melena, nausea and vomiting.  Genitourinary:  Negative for dysuria, frequency and urgency.  Musculoskeletal:  Negative for back pain and joint pain.  Skin:  Negative for itching.  Neurological:  Negative for tingling, focal weakness, weakness and headaches.  Endo/Heme/Allergies:  Does not bruise/bleed easily.  Psychiatric/Behavioral:  Negative for depression. The patient is not nervous/anxious and does not have insomnia.     MEDICAL HISTORY:  Past Medical History:  Diagnosis Date   Arthritis    Dysrhythmia    A-fib   Hip pain    left   HOH (hard of hearing)    LBBB (left bundle branch block) 08/05/2020   Lung cancer ([place])    Skin cancer, basal cell     face top of head    SURGICAL HISTORY: Past Surgical History:  Procedure Laterality Date   CATARACT EXTRACTION W/PHACO Right 07/13/2017   Procedure: CATARACT EXTRACTION PHACO AND INTRAOCULAR LENS PLACEMENT ([place]);  Surgeon: Leandrew Koyanagi, MD;  Location: [facility];  Service: Ophthalmology;  Laterality: Right;  IVA TOPICAL RIGHT   CATARACT EXTRACTION W/PHACO Left 01/04/2018   Procedure: CATARACT EXTRACTION PHACO AND INTRAOCULAR LENS PLACEMENT ([place]) LEFT;  Surgeon: Leandrew Koyanagi, MD;  Location: [facility];  Service: Ophthalmology;  Laterality: Left;   COLONOSCOPY     IR IMAGING GUIDED PORT INSERTION  08/27/2020   JOINT REPLACEMENT     Left total hip Dr. Su Hoff 08-04-18   ROTATOR CUFF REPAIR Right    SKIN CANCER EXCISION     face   TOTAL HIP ARTHROPLASTY Left 08/04/2018   Procedure: TOTAL HIP ARTHROPLASTY ANTERIOR APPROACH;  Surgeon:  Frederik Pear, MD;  [facility];  Service: Orthopedics;  Laterality: Left;   VIDEO BRONCHOSCOPY WITH ENDOBRONCHIAL NAVIGATION N/A 08/07/2020   Procedure: VIDEO BRONCHOSCOPY WITH ENDOBRONCHIAL NAVIGATION;  Surgeon: Ottie Glazier, MD;  Location: [facility];  Service: Thoracic;  Laterality: N/A;   VIDEO BRONCHOSCOPY WITH ENDOBRONCHIAL ULTRASOUND N/A 08/07/2020   Procedure: VIDEO BRONCHOSCOPY WITH ENDOBRONCHIAL ULTRASOUND;  Surgeon: Ottie Glazier, MD;  Location: [facility];  Service: Thoracic;  Laterality: N/A;    SOCIAL HISTORY: Social History   Socioeconomic History   Marital status: Married    Spouse name: Not on file   Number of children: Not on file   Years of education: Not on file   Highest education level: Not on file  Occupational History   Not on file  Tobacco Use   Smoking status: Former    Packs/day: 1.00    Years: 4.00    Pack years: 4.00    Types: Cigarettes   Smokeless tobacco: Never   Tobacco comments:    quit early 70's  Vaping Use   Vaping Use: Never used  Substance and Sexual Activity   Alcohol use: No   Drug use: No   Sexual activity: Not Currently  Other Topics Concern   Not on file  Social History Narrative   > quit 35 years; smoked for 15 years. Rare alcohol. 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    Social Determinants of Health   Financial Resource Strain: Not on file  Food Insecurity: Not on file  Transportation Needs: Not on file  Physical Activity: Not on file  Stress: Not on file  Social Connections: Not on file  Intimate Partner Violence: Not on file    FAMILY HISTORY: Family History  Problem Relation Age of Onset   Throat cancer Brother         & lung cancer    ALLERGIES:  has No Known Allergies.  MEDICATIONS:  Current Outpatient Medications  Medication Sig Dispense Refill   acetaminophen (TYLENOL) 500 MG tablet Take 500 mg by mouth every 8 (eight) hours as needed  for moderate pain.     apixaban (ELIQUIS) 5 MG TABS tablet Take 5 mg by mouth 2 (two) times daily.     hydroxypropyl methylcellulose / hypromellose (ISOPTO TEARS / GONIOVISC) 2.5 % ophthalmic solution Place 1 drop into the left eye daily as needed for dry eyes.     lidocaine-prilocaine (EMLA) cream Apply 1 application topically as needed. Apply small amount to port site at least 1 hour prior to it being accessed, cover with plastic wrap 30 g 1   Multiple Vitamin (MULTIVITAMIN) tablet Take 1 tablet by mouth daily.     Polyethyl Glycol-Propyl Glycol (SYSTANE FREE OP) Apply 1 drop to eye as needed.     pravastatin (PRAVACHOL) 80 MG tablet Take 80 mg by mouth at bedtime.      saw palmetto 160 MG capsule Take 160 mg by mouth 2 (two) times daily.     No current  facility-administered medications for this visit.   Facility-Administered Medications Ordered in Other Visits  Medication Dose Route Frequency Provider Last Rate Last Admin   durvalumab (IMFINZI) 740 mg in sodium chloride 0.9 % 100 mL chemo infusion  10 mg/kg (Order-Specific) Intravenous Once [name], MD 115 mL/hr at 07/30/21 0938 740 mg at 07/30/21 0938   heparin lock flush 100 unit/mL  500 Units Intracatheter Once PRN Charlaine Dalton R, MD       sodium chloride flush (NS) 0.9 % injection 10 mL  10 mL Intravenous PRN Charlaine Dalton R, MD   10 mL at 02/11/21 0835   sodium chloride flush (NS) 0.9 % injection 10 mL  10 mL Intracatheter PRN [name], MD       PHYSICAL EXAMINATION: ECOG PERFORMANCE STATUS: 1 - Symptomatic but completely ambulatory  Vitals:   07/30/21 0859  BP: 133/65  Pulse: 68  Resp: 16  Temp: 97.6 F (36.4 C)  SpO2: 100%   Filed Weights   07/30/21 0859  Weight: 159 lb (72.1 kg)    Physical Exam HENT:     Head: Normocephalic and atraumatic.     Mouth/Throat:     Pharynx: No oropharyngeal exudate.  Eyes:     Pupils: Pupils are equal, round, and reactive to light.   Cardiovascular:     Rate and Rhythm: Normal rate and regular rhythm.  Pulmonary:     Effort: Pulmonary effort is normal. No respiratory distress.     Breath sounds: No wheezing.     Comments: Slightly decreased breath sounds left lower lung base. Abdominal:     General: Bowel sounds are normal. There is no distension.     Palpations: Abdomen is soft. There is no mass.     Tenderness: There is no abdominal tenderness. There is no guarding or rebound.  Musculoskeletal:        General: No tenderness. Normal range of motion.     Cervical back: Normal range of motion and neck supple.  Skin:    General: Skin is warm.  Neurological:     Mental Status: He is alert and oriented to person, place, and time.  Psychiatric:        Mood and Affect: Affect normal.     LABORATORY DATA:  I have reviewed the data as listed Lab Results  Component Value Date   WBC 6.8 07/30/2021   HGB 14.0 07/30/2021   HCT 41.5 07/30/2021   MCV 95.6 07/30/2021   PLT 234 07/30/2021   Recent Labs    07/02/21 0855 07/16/21 0804 07/30/21 0835  NA 138 134* 137  K 4.3 3.9 4.1  CL 105 101 103  CO2 26 24 25   GLUCOSE 100* 179* 129*  BUN 18 21 26*  CREATININE 1.04 0.89 0.87  CALCIUM 9.0 8.7* 9.1  GFRNONAA >60 >60 >60  PROT 7.3 7.2 7.3  ALBUMIN 3.9 3.9 4.3  AST 28 30 30   ALT 16 17 27   ALKPHOS 125 117 107  BILITOT 0.8 1.2 0.9    RADIOGRAPHIC STUDIES: I have personally reviewed the radiological images as listed and agreed with the findings in the report. No results found.   ASSESSMENT & PLAN:   Cancer of lower lobe of left lung ([place]) # T2N3- Stage III-non-small cell lung cancer-[favor adenocarcinoma]- s/p  concurrent chemoradiation; Finished Feb 2022]; currently on adjuvant durvalumab. AUG 17th 2022- CT-improvement of the left lower lobe subpleural mass ; however noted to have increasing left-sided pleural effusion  [see below].  STABLE  # proceed with IMFINZI; Labs today reviewed;  acceptable for  treatment today. MAY 2022-TSH-WNL.   # left-sided pleural effusion-monitor for now if worse would recommend diagnostic thoracentesis- CXR Sep 2022- overall  STABLE. Will repeat CT scan in mid nov 2022.   # A. fib on Eliquis [Dr.Kowalski]-  Mild swelling in legs- EF in Feb 2022- 55-60%; recommend BIL compression stocking/leg elevation. appt on oct 10th, 2022- STABLE.   # Left shoulder pain-likely MSK.  S/p Steroidal injection- Improved.   # Vaccination counselling:s/p  #3 booster S/p  Pneumonia shot; s/p flu shot.  # DISPOSITION:  # proceed with  IMFINZI today; # follow up in 2 weeks-MD; labs- cbc/cmp;BNP -;IMFINZI; CT chest- prior--Dr.B  All questions were answered. The patient knows to call the clinic with any problems, questions or concerns.   [name], MD 07/30/2021 10:01 AM

## 2021-07-30 NOTE — Assessment & Plan Note (Addendum)
#   T2N3- Stage III-non-small cell lung cancer-[favor adenocarcinoma]- s/p  concurrent chemoradiation; Finished Feb 2022]; currently on adjuvant durvalumab. AUG 17th 2022- CT-improvement of the left lower lobe subpleural mass ; however noted to have increasing left-sided pleural effusion  [see below]. STABLE  # proceed with IMFINZI; Labs today reviewed;  acceptable for treatment today. MAY 2022-TSH-WNL.   # left-sided pleural effusion-monitor for now if worse would recommend diagnostic thoracentesis- CXR Sep 2022- overall  STABLE. Will repeat CT scan in mid nov 2022.   # A. fib on Eliquis [Dr.Kowalski]-  Mild swelling in legs- EF in Feb 2022- 55-60%; recommend BIL compression stocking/leg elevation. appt on oct 10th, 2022- STABLE.   # Left shoulder pain-likely MSK.  S/p Steroidal injection- Improved.   # Vaccination counselling:s/p  #3 booster S/p  Pneumonia shot; s/p flu shot.  # DISPOSITION:  # proceed with  IMFINZI today; # follow up in 2 weeks-MD; labs- cbc/cmp;BNP -;IMFINZI; CT chest- prior--Dr.B

## 2021-07-30 NOTE — Progress Notes (Signed)
Pt in for follow up, denies any concerns today.  Pt weight is down 5 lbs.

## 2021-07-30 NOTE — Patient Instructions (Signed)
[facility]COL[facility]  Discharge Instructions: Thank you for choosing [facility] to provide your oncology and hematology care.  If you have a lab appointment with the [facility], please go directly to the [facility] and check in at the registration area.  Wear comfortable clothing and clothing appropriate for easy access to any Portacath or PICC line.   We strive to give you quality time with your provider. You may need to reschedule your appointment if you arrive late (15 or more minutes).  Arriving late affects you and other patients whose appointments are after yours.  Also, if you miss three or more appointments without notifying the office, you may be dismissed from the clinic at the provider's discretion.      For prescription refill requests, have your pharmacy contact our office and allow 72 hours for refills to be completed.    Today you received the following chemotherapy and/or immunotherapy agents - durvalumab      To help prevent nausea and vomiting after your treatment, we encourage you to take your nausea medication as directed.  BELOW ARE SYMPTOMS THAT SHOULD BE REPORTED IMMEDIATELY: *FEVER GREATER THAN 100.4 F (38 C) OR HIGHER *CHILLS OR SWEATING *NAUSEA AND VOMITING THAT IS NOT CONTROLLED WITH YOUR NAUSEA MEDICATION *UNUSUAL SHORTNESS OF BREATH *UNUSUAL BRUISING OR BLEEDING *URINARY PROBLEMS (pain or burning when urinating, or frequent urination) *BOWEL PROBLEMS (unusual diarrhea, constipation, pain near the anus) TENDERNESS IN MOUTH AND THROAT WITH OR WITHOUT PRESENCE OF ULCERS (sore throat, sores in mouth, or a toothache) UNUSUAL RASH, SWELLING OR PAIN  UNUSUAL VAGINAL DISCHARGE OR ITCHING   Items with * indicate a potential emergency and should be followed up as soon as possible or go to the Emergency Department if any problems should occur.  Please show the CHEMOTHERAPY ALERT CARD or IMMUNOTHERAPY ALERT CARD at  check-in to the Emergency Department and triage nurse.  Should you have questions after your visit or need to cancel or reschedule your appointment, please contact [facility]  [phone] and follow the prompts.  Office hours are 8:00 a.m. to 4:30 p.m. Monday - Friday. Please note that voicemails left after 4:00 p.m. may not be returned until the following business day.  We are closed weekends and major holidays. You have access to a nurse at all times for urgent questions. Please call the main number to the clinic [phone] and follow the prompts.  For any non-urgent questions, you may also contact your provider using MyChart. We now offer e-Visits for anyone 17 and older to request care online for non-urgent symptoms. For details visit [email or web address].   Also download the MyChart app! Go to the app store, search "MyChart", open the app, select [facility], and log in with your MyChart username and password.  Due to Covid, a mask is required upon entering the hospital/clinic. If you do not have a mask, one will be given to you upon arrival. For doctor visits, patients may have 1 support person aged 110 or older with them. For treatment visits, patients cannot have anyone with them due to current Covid guidelines and our immunocompromised population.   Durvalumab injection What is this medication? DURVALUMAB (dur VAL ue mab) is a monoclonal antibody. It is used to treat lung cancer. This medicine may be used for other purposes; ask your health care provider or pharmacist if you have questions. COMMON BRAND NAME(S): IMFINZI What should I tell my care team before I  take this medication? They need to know if you have any of these conditions: autoimmune diseases like Crohn's disease, ulcerative colitis, or lupus have had or planning to have an allogeneic stem cell transplant (uses someone else's stem cells) history of organ transplant history of  radiation to the chest nervous system problems like myasthenia gravis or Guillain-Barre syndrome an unusual or allergic reaction to durvalumab, other medicines, foods, dyes, or preservatives pregnant or trying to get pregnant breast-feeding How should I use this medication? This medicine is for infusion into a vein. It is given by a health care professional in a hospital or clinic setting. A special MedGuide will be given to you before each treatment. Be sure to read this information carefully each time. Talk to your pediatrician regarding the use of this medicine in children. Special care may be needed. Overdosage: If you think you have taken too much of this medicine contact a poison control center or emergency room at once. NOTE: This medicine is only for you. Do not share this medicine with others. What if I miss a dose? It is important not to miss your dose. Call your doctor or health care professional if you are unable to keep an appointment. What may interact with this medication? Interactions have not been studied. This list may not describe all possible interactions. Give your health care provider a list of all the medicines, herbs, non-prescription drugs, or dietary supplements you use. Also tell them if you smoke, drink alcohol, or use illegal drugs. Some items may interact with your medicine. What should I watch for while using this medication? This drug may make you feel generally unwell. Continue your course of treatment even though you feel ill unless your doctor tells you to stop. You may need blood work done while you are taking this medicine. Do not become pregnant while taking this medicine or for 3 months after stopping it. Women should inform their doctor if they wish to become pregnant or think they might be pregnant. There is a potential for serious side effects to an unborn child. Talk to your health care professional or pharmacist for more information. Do not breast-feed  an infant while taking this medicine or for 3 months after stopping it. What side effects may I notice from receiving this medication? Side effects that you should report to your doctor or health care professional as soon as possible: allergic reactions like skin rash, itching or hives, swelling of the face, lips, or tongue black, tarry stools bloody or watery diarrhea breathing problems change in emotions or moods change in sex drive changes in vision chest pain or chest tightness chills confusion cough facial flushing fever headache signs and symptoms of high blood sugar such as dizziness; dry mouth; dry skin; fruity breath; nausea; stomach pain; increased hunger or thirst; increased urination signs and symptoms of liver injury like dark yellow or brown urine; general ill feeling or flu-like symptoms; light-colored stools; loss of appetite; nausea; right upper belly pain; unusually weak or tired; yellowing of the eyes or skin stomach pain trouble passing urine or change in the amount of urine weight gain or weight loss Side effects that usually do not require medical attention (report these to your doctor or health care professional if they continue or are bothersome): bone pain constipation loss of appetite muscle pain nausea swelling of the ankles, feet, hands tiredness This list may not describe all possible side effects. Call your doctor for medical advice about side effects. You may report  side effects to FDA at 1-800-FDA-1088. Where should I keep my medication? This drug is given in a hospital or clinic and will not be stored at home. NOTE: This sheet is a summary. It may not cover all possible information. If you have questions about this medicine, talk to your doctor, pharmacist, or health care provider.  2022 Elsevier/Gold Standard (2019-11-29 13:01:29)

## 2021-08-06 ENCOUNTER — Ambulatory Visit
Admission: RE | Admit: 2021-08-06 | Discharge: 2021-08-06 | Disposition: A | Discharge status: Home / Self Care | Payer: Medicare Other | Source: Ambulatory Visit | Attending: Internal Medicine | Admitting: Internal Medicine

## 2021-08-06 ENCOUNTER — Other Ambulatory Visit: Payer: Self-pay

## 2021-08-06 DIAGNOSIS — C3432 Malignant neoplasm of lower lobe, left bronchus or lung: Secondary | ICD-10-CM | POA: Insufficient documentation

## 2021-08-13 ENCOUNTER — Inpatient Hospital Stay: Payer: Medicare Other

## 2021-08-13 ENCOUNTER — Encounter: Payer: Self-pay | Admitting: Internal Medicine

## 2021-08-13 ENCOUNTER — Inpatient Hospital Stay: Payer: Medicare Other | Attending: Internal Medicine

## 2021-08-13 ENCOUNTER — Inpatient Hospital Stay (HOSPITAL_BASED_OUTPATIENT_CLINIC_OR_DEPARTMENT_OTHER): Payer: Medicare Other | Admitting: Internal Medicine

## 2021-08-13 ENCOUNTER — Other Ambulatory Visit: Payer: Self-pay

## 2021-08-13 DIAGNOSIS — C3432 Malignant neoplasm of lower lobe, left bronchus or lung: Secondary | ICD-10-CM | POA: Diagnosis present

## 2021-08-13 DIAGNOSIS — Z923 Personal history of irradiation: Secondary | ICD-10-CM | POA: Insufficient documentation

## 2021-08-13 DIAGNOSIS — I447 Left bundle-branch block, unspecified: Secondary | ICD-10-CM | POA: Insufficient documentation

## 2021-08-13 DIAGNOSIS — R600 Localized edema: Secondary | ICD-10-CM | POA: Diagnosis not present

## 2021-08-13 DIAGNOSIS — Z79899 Other long term (current) drug therapy: Secondary | ICD-10-CM | POA: Insufficient documentation

## 2021-08-13 DIAGNOSIS — I4891 Unspecified atrial fibrillation: Secondary | ICD-10-CM | POA: Insufficient documentation

## 2021-08-13 DIAGNOSIS — Z5112 Encounter for antineoplastic immunotherapy: Secondary | ICD-10-CM | POA: Diagnosis present

## 2021-08-13 DIAGNOSIS — Z7901 Long term (current) use of anticoagulants: Secondary | ICD-10-CM | POA: Diagnosis not present

## 2021-08-13 DIAGNOSIS — M7989 Other specified soft tissue disorders: Secondary | ICD-10-CM | POA: Diagnosis not present

## 2021-08-13 DIAGNOSIS — Z9221 Personal history of antineoplastic chemotherapy: Secondary | ICD-10-CM | POA: Diagnosis not present

## 2021-08-13 DIAGNOSIS — M169 Osteoarthritis of hip, unspecified: Secondary | ICD-10-CM | POA: Diagnosis not present

## 2021-08-13 DIAGNOSIS — J90 Pleural effusion, not elsewhere classified: Secondary | ICD-10-CM | POA: Diagnosis not present

## 2021-08-13 DIAGNOSIS — R0602 Shortness of breath: Secondary | ICD-10-CM

## 2021-08-13 DIAGNOSIS — I2510 Atherosclerotic heart disease of native coronary artery without angina pectoris: Secondary | ICD-10-CM | POA: Insufficient documentation

## 2021-08-13 LAB — BRAIN NATRIURETIC PEPTIDE: B Natriuretic Peptide: 401 pg/mL — ABNORMAL HIGH (ref 0.0–100.0)

## 2021-08-13 LAB — CBC WITH DIFFERENTIAL/PLATELET
Abs Immature Granulocytes: 0.02 10*3/uL (ref 0.00–0.07)
Basophils Absolute: 0 10*3/uL (ref 0.0–0.1)
Basophils Relative: 0 %
Eosinophils Absolute: 0.1 10*3/uL (ref 0.0–0.5)
Eosinophils Relative: 1 %
HCT: 42.1 % (ref 39.0–52.0)
Hemoglobin: 14.3 g/dL (ref 13.0–17.0)
Immature Granulocytes: 0 %
Lymphocytes Relative: 14 %
Lymphs Abs: 1.1 10*3/uL (ref 0.7–4.0)
MCH: 32.6 pg (ref 26.0–34.0)
MCHC: 34 g/dL (ref 30.0–36.0)
MCV: 95.9 fL (ref 80.0–100.0)
Monocytes Absolute: 0.7 10*3/uL (ref 0.1–1.0)
Monocytes Relative: 9 %
Neutro Abs: 6 10*3/uL (ref 1.7–7.7)
Neutrophils Relative %: 76 %
Platelets: 247 10*3/uL (ref 150–400)
RBC: 4.39 MIL/uL (ref 4.22–5.81)
RDW: 13.5 % (ref 11.5–15.5)
WBC: 7.9 10*3/uL (ref 4.0–10.5)
nRBC: 0 % (ref 0.0–0.2)

## 2021-08-13 LAB — COMPREHENSIVE METABOLIC PANEL
ALT: 22 U/L (ref 0–44)
AST: 27 U/L (ref 15–41)
Albumin: 4 g/dL (ref 3.5–5.0)
Alkaline Phosphatase: 133 U/L — ABNORMAL HIGH (ref 38–126)
Anion gap: 8 (ref 5–15)
BUN: 18 mg/dL (ref 8–23)
CO2: 28 mmol/L (ref 22–32)
Calcium: 8.9 mg/dL (ref 8.9–10.3)
Chloride: 102 mmol/L (ref 98–111)
Creatinine, Ser: 0.94 mg/dL (ref 0.61–1.24)
GFR, Estimated: >60 mL/min (ref >60)
Glucose, Bld: 123 mg/dL — ABNORMAL HIGH (ref 70–99)
Potassium: 4.1 mmol/L (ref 3.5–5.1)
Sodium: 138 mmol/L (ref 135–145)
Total Bilirubin: 0.9 mg/dL (ref 0.3–1.2)
Total Protein: 7.5 g/dL (ref 6.5–8.1)

## 2021-08-13 MED ORDER — HEPARIN SOD (PORK) LOCK FLUSH 100 UNIT/ML IV SOLN
500.0000 [IU] | Freq: Once | INTRAVENOUS | Status: AC | PRN
  Administered 2021-08-13: 500 [IU]
  Filled 2021-08-13: qty 5

## 2021-08-13 MED ORDER — SODIUM CHLORIDE 0.9 % IV SOLN
10.0000 mg/kg | Freq: Once | INTRAVENOUS | Status: AC
  Administered 2021-08-13: 740 mg via INTRAVENOUS
  Filled 2021-08-13: qty 10

## 2021-08-13 MED ORDER — HEPARIN SOD (PORK) LOCK FLUSH 100 UNIT/ML IV SOLN
INTRAVENOUS | Status: AC
  Filled 2021-08-13: qty 5

## 2021-08-13 MED ORDER — SODIUM CHLORIDE 0.9 % IV SOLN
Freq: Once | INTRAVENOUS | Status: AC
  Filled 2021-08-13: qty 250

## 2021-08-13 NOTE — Progress Notes (Signed)
[facility] NOTE  Patient Care Team: Baxter Hire, MD as PCP - General (Internal Medicine) [name], RN as Oncology Nurse Navigator [name], MD as Consulting Physician (Hematology and Oncology)  CHIEF COMPLAINTS/PURPOSE OF CONSULTATION: Lung cancer  #  Oncology History Overview Note  # NOV 2021- LEFT LOWER LOBE NON-SMALL CELL CA [favor adeno ca]; T2N3 [right hilar; subcarinal LN;[name]; NOV 2021-MRI [facility]  # 11/30- carbo-Taxol-RT [RT until 10/23/20]  # Right lower lobe - ~4.6 x 3.1 cm  [PET 07-2020]- demonstrates no significant hypermetabolic activity (SUV max 1.8]-benign. STABLE.   # A.fib [Eliquis; Dr.Kowalski]  # # SURVIVORSHIP:   # GENETICS:   #NGS-ordered  DIAGNOSIS: Lung cancer  STAGE:   III      ;  GOALS:  cure  CURRENT/MOST RECENT THERAPY : carbo-Taxol-RT ]   Cancer of lower lobe of left lung ([place])  08/14/2020 Initial Diagnosis   Cancer of lower lobe of left lung ([facility])   09/02/2020 - 10/22/2020 Chemotherapy    Patient is on Treatment Plan: LUNG DURVALUMAB Q14D       10/16/2020 Cancer Staging   Staging form: Lung, AJCC 8th Edition - Clinical: Stage IIIB (cT2a, cN3, cM0) - Signed by [name], MD on 10/16/2020    12/17/2020 -  Chemotherapy   Patient is on Treatment Plan : LUNG Durvalumab q14d      HISTORY OF PRESENTING ILLNESS: Walking independently but with a limp because of left hip arthritis.  He is alone.  Jason Warren 87 y.o.  male patient with stage III lung non-small cell lung-currently adjuvant duvalumab is here for follow-up/review results of CT scan.  Patient denies any new symptoms.  Patient denies any shortness of breath or cough. Denies any worsening swelling in the legs-improved on compression stockings..  No new chest pain or nausea vomiting diarrhea.  Review of Systems  Constitutional:  Negative for chills, diaphoresis, fever, malaise/fatigue and weight loss.  HENT:   Negative for nosebleeds and sore throat.   Eyes:  Negative for double vision.  Respiratory:  Negative for hemoptysis, sputum production, shortness of breath and wheezing.   Cardiovascular:  Negative for chest pain, palpitations, orthopnea and leg swelling.  Gastrointestinal:  Negative for abdominal pain, blood in stool, constipation, diarrhea, heartburn, melena, nausea and vomiting.  Genitourinary:  Negative for dysuria, frequency and urgency.  Musculoskeletal:  Negative for back pain and joint pain.  Skin:  Negative for itching.  Neurological:  Negative for tingling, focal weakness, weakness and headaches.  Endo/Heme/Allergies:  Does not bruise/bleed easily.  Psychiatric/Behavioral:  Negative for depression. The patient is not nervous/anxious and does not have insomnia.     MEDICAL HISTORY:  Past Medical History:  Diagnosis Date   Arthritis    Dysrhythmia    A-fib   Hip pain    left   HOH (hard of hearing)    LBBB (left bundle branch block) 08/05/2020   Lung cancer ([place] [facility])    Skin cancer, basal cell     face top of head    SURGICAL HISTORY: Past Surgical History:  Procedure Laterality Date   CATARACT EXTRACTION W/PHACO Right 07/13/2017   Procedure: CATARACT EXTRACTION PHACO AND INTRAOCULAR LENS PLACEMENT ([facility]);  Surgeon: Leandrew Koyanagi, MD;  Location: [facility];  Service: Ophthalmology;  Laterality: Right;  IVA TOPICAL RIGHT   CATARACT EXTRACTION W/PHACO Left 01/04/2018   Procedure: CATARACT EXTRACTION PHACO AND INTRAOCULAR LENS PLACEMENT ([facility]) LEFT;  Surgeon: Leandrew Koyanagi, MD;  Location: [facility];  Service: Ophthalmology;  Laterality: Left;   COLONOSCOPY     IR IMAGING GUIDED PORT INSERTION  08/27/2020   JOINT REPLACEMENT     Left total hip Dr. Su Hoff 08-04-18   ROTATOR CUFF REPAIR Right    SKIN CANCER EXCISION     face   TOTAL HIP ARTHROPLASTY Left 08/04/2018   Procedure: TOTAL HIP ARTHROPLASTY ANTERIOR APPROACH;  Surgeon: Frederik Pear,  MD;  Location: [facility];  Service: Orthopedics;  Laterality: Left;   VIDEO BRONCHOSCOPY WITH ENDOBRONCHIAL NAVIGATION N/A 08/07/2020   Procedure: VIDEO BRONCHOSCOPY WITH ENDOBRONCHIAL NAVIGATION;  Surgeon: Ottie Glazier, MD;  Location: [facility];  Service: Thoracic;  Laterality: N/A;   VIDEO BRONCHOSCOPY WITH ENDOBRONCHIAL ULTRASOUND N/A 08/07/2020   Procedure: VIDEO BRONCHOSCOPY WITH ENDOBRONCHIAL ULTRASOUND;  Surgeon: Ottie Glazier, MD;  Location: [facility];  Service: Thoracic;  Laterality: N/A;    SOCIAL HISTORY: Social History   Socioeconomic History   Marital status: Married    Spouse name: Not on file   Number of children: Not on file   Years of education: Not on file   Highest education level: Not on file  Occupational History   Not on file  Tobacco Use   Smoking status: Former    Packs/day: 1.00    Years: 4.00    Pack years: 4.00    Types: Cigarettes   Smokeless tobacco: Never   Tobacco comments:    quit early 70's  Vaping Use   Vaping Use: Never used  Substance and Sexual Activity   Alcohol use: No   Drug use: No   Sexual activity: Not Currently  Other Topics Concern   Not on file  Social History Narrative   > quit 35 years; smoked for 15 years. Rare alcohol. 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    Social Determinants of Health   Financial Resource Strain: Not on file  Food Insecurity: Not on file  Transportation Needs: Not on file  Physical Activity: Not on file  Stress: Not on file  Social Connections: Not on file  Intimate Partner Violence: Not on file    FAMILY HISTORY: Family History  Problem Relation Age of Onset   Throat cancer Brother         & lung cancer    ALLERGIES:  has No Known Allergies.  MEDICATIONS:  Current Outpatient Medications  Medication Sig Dispense Refill   acetaminophen (TYLENOL) 500 MG tablet Take 500 mg by mouth every 8 (eight) hours as needed for moderate  pain.     apixaban (ELIQUIS) 5 MG TABS tablet Take 5 mg by mouth 2 (two) times daily.     hydroxypropyl methylcellulose / hypromellose (ISOPTO TEARS / GONIOVISC) 2.5 % ophthalmic solution Place 1 drop into the left eye daily as needed for dry eyes.     lidocaine-prilocaine (EMLA) cream Apply 1 application topically as needed. Apply small amount to port site at least 1 hour prior to it being accessed, cover with plastic wrap 30 g 1   Multiple Vitamin (MULTIVITAMIN) tablet Take 1 tablet by mouth daily.     Polyethyl Glycol-Propyl Glycol (SYSTANE FREE OP) Apply 1 drop to eye as needed.     pravastatin (PRAVACHOL) 80 MG tablet Take 80 mg by mouth at bedtime.      saw palmetto 160 MG capsule Take 160 mg by mouth 2 (two) times daily.     No current facility-administered medications for  this visit.   Facility-Administered Medications Ordered in Other Visits  Medication Dose Route Frequency Provider Last Rate Last Admin   heparin lock flush 100 UNIT/ML injection            sodium chloride flush (NS) 0.9 % injection 10 mL  10 mL Intravenous PRN [name], MD   10 mL at 02/11/21 0835   PHYSICAL EXAMINATION: ECOG PERFORMANCE STATUS: 1 - Symptomatic but completely ambulatory  Vitals:   08/13/21 0923  BP: 126/67  Pulse: 68  Resp: 18  Temp: (!) 96.9 F (36.1 C)   Filed Weights   08/13/21 0923  Weight: 158 lb 8 oz (71.9 kg)    Physical Exam HENT:     Head: Normocephalic and atraumatic.     Mouth/Throat:     Pharynx: No oropharyngeal exudate.  Eyes:     Pupils: Pupils are equal, round, and reactive to light.  Cardiovascular:     Rate and Rhythm: Normal rate and regular rhythm.  Pulmonary:     Effort: Pulmonary effort is normal. No respiratory distress.     Breath sounds: No wheezing.     Comments: Slightly decreased breath sounds left lower lung base. Abdominal:     General: Bowel sounds are normal. There is no distension.     Palpations: Abdomen is soft. There is no mass.      Tenderness: There is no abdominal tenderness. There is no guarding or rebound.  Musculoskeletal:        General: No tenderness. Normal range of motion.     Cervical back: Normal range of motion and neck supple.  Skin:    General: Skin is warm.  Neurological:     Mental Status: He is alert and oriented to person, place, and time.  Psychiatric:        Mood and Affect: Affect normal.     LABORATORY DATA:  I have reviewed the data as listed Lab Results  Component Value Date   WBC 7.9 08/13/2021   HGB 14.3 08/13/2021   HCT 42.1 08/13/2021   MCV 95.9 08/13/2021   PLT 247 08/13/2021   Recent Labs    07/16/21 0804 07/30/21 0835 08/13/21 0858  NA 134* 137 138  K 3.9 4.1 4.1  CL 101 103 102  CO2 24 25 28   GLUCOSE 179* 129* 123*  BUN 21 26* 18  CREATININE 0.89 0.87 0.94  CALCIUM 8.7* 9.1 8.9  GFRNONAA >60 >60 >60  PROT 7.2 7.3 7.5  ALBUMIN 3.9 4.3 4.0  AST 30 30 27   ALT 17 27 22   ALKPHOS 117 107 133*  BILITOT 1.2 0.9 0.9    RADIOGRAPHIC STUDIES: I have personally reviewed the radiological images as listed and agreed with the findings in the report. CT CHEST WO CONTRAST  Result Date: 08/06/2021 CLINICAL DATA:  Restaging of left lower lobe lung cancer diagnosed last October. Status post chemotherapy, radiation therapy and immunotherapy. Left lower lobe non-small cell. EXAM: CT CHEST WITHOUT CONTRAST TECHNIQUE: Multidetector CT imaging of the chest was performed following the standard protocol without IV contrast. COMPARISON:  05/20/2021.  Clinic note 07/30/2021. FINDINGS: Cardiovascular: Right Port-A-Cath tip high right atrium. Aortic atherosclerosis. Tortuous thoracic aorta. Mild cardiomegaly, without pericardial. Lad and right coronary artery calcification. Mediastinum/Nodes: No supraclavicular adenopathy. No mediastinal or definite hilar adenopathy, given limitations of unenhanced CT. Lungs/Pleura: Small left pleural effusion with minimal loculation medially and laterally,  similar. 3 mm right middle lobe pulmonary nodule with surrounding ground-glass on 104/3, new since  the prior. A 4 mm more anterior right middle lobe nodule on 96/3 is unchanged. Architectural distortion, volume loss surrounding right lower lobe dilated bronchi, possibly treatment related. Somewhat amorphous and difficult to measure, but on the order of 3.4 x 2.4 cm on 92/3 versus 3.3 x 2.5 cm in the same level on the prior. 2 mm right lower lobe subpleural pulmonary nodule on 99/3, similar. Similar appearance of left perihilar volume loss and architectural distortion with traction bronchiectasis. No well-defined residual or recurrent mass. Upper Abdomen: Normal imaged portions of the liver, spleen, stomach pancreas, gallbladder, adrenal glands. Colonic stool burden suggests constipation. Low-density upper and interpolar right renal lesions are likely cysts including up to 2.2 cm. Musculoskeletal: Osteopenia. Remote sternal body fracture. Mild to moderate L1 compression deformity is unchanged. Mild superior endplate compression deformity at T12 is also chronic. IMPRESSION: 1. Similar appearance of left perihilar presumably radiation induced fibrosis and architectural distortion. No evidence of recurrent or residual disease. 2. Right lower lobe peribronchovascular architectural distortion is also unchanged and may be treatment related. 3. Primarily similar tiny pulmonary nodules. A medial right middle lobe 3 mm nodule is new since the prior exam, warranting follow-up attention. 4.  Possible constipation. 5. Coronary artery atherosclerosis. Aortic Atherosclerosis (ICD10-I70.0). 6. Similar small left pleural Electronically Signed   By: Abigail Miyamoto M.D.   On: 08/06/2021 16:09     ASSESSMENT & PLAN:   Cancer of lower lobe of left lung ([place]) # T2N3- Stage III-non-small cell lung cancer-[favor adenocarcinoma]- s/p  concurrent chemoradiation; Finished Feb 2022]; currently on adjuvant durvalumab. CT scan- NOV 7th,  2022-continued left lower lobe architectural distortion/fibrosis without any evidence of recurrent disease.  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  # proceed with IMFINZI; Labs today reviewed;  acceptable for treatment today. MAY 2022-TSH-WNL.   #CT scan-August 10, 2021- medial right middle lobe 3 mm nodule is new since the prior exam-monitor for now.  Further imaging down the line.  # left-sided pleural effusion-CT November 2022 stable.  Hold off any thoracentesis.  # A. fib on Eliquis [Dr.Kowalski]-  Mild swelling in legs- EF in Feb 2022- 55-60%; continue BIL compression stocking/leg elevation.  Stable.  # Vaccination counselling:s/p  #3 booster S/p  Pneumonia shot; s/p flu shot.  # DISPOSITION: TG-3w # proceed with  IMFINZI today; # follow up in 3 weeks-MD; labs- cbc/cmp;;IMFINZI;Dr.B   All questions were answered. The patient knows to call the clinic with any problems, questions or concerns.   [name], MD 08/13/2021 5:49 PM

## 2021-08-13 NOTE — Progress Notes (Signed)
Pt received imfinzi infusion in clinic today. Tolerated well. No complaints at d/c.

## 2021-08-13 NOTE — Patient Instructions (Signed)
[facility]   Discharge Instructions: Thank you for choosing [facility] to provide your oncology and hematology care.  If you have a lab appointment with the [facility], please go directly to the [facility] and check in at the registration area.  Wear comfortable clothing and clothing appropriate for easy access to any Portacath or PICC line.   We strive to give you quality time with your provider. You may need to reschedule your appointment if you arrive late (15 or more minutes).  Arriving late affects you and other patients whose appointments are after yours.  Also, if you miss three or more appointments without notifying the office, you may be dismissed from the clinic at the provider's discretion.      For prescription refill requests, have your pharmacy contact our office and allow 72 hours for refills to be completed.    Today you received the following chemotherapy and/or immunotherapy agents - Imfinzi      To help prevent nausea and vomiting after your treatment, we encourage you to take your nausea medication as directed.  BELOW ARE SYMPTOMS THAT SHOULD BE REPORTED IMMEDIATELY: *FEVER GREATER THAN 100.4 F (38 C) OR HIGHER *CHILLS OR SWEATING *NAUSEA AND VOMITING THAT IS NOT CONTROLLED WITH YOUR NAUSEA MEDICATION *UNUSUAL SHORTNESS OF BREATH *UNUSUAL BRUISING OR BLEEDING *URINARY PROBLEMS (pain or burning when urinating, or frequent urination) *BOWEL PROBLEMS (unusual diarrhea, constipation, pain near the anus) TENDERNESS IN MOUTH AND THROAT WITH OR WITHOUT PRESENCE OF ULCERS (sore throat, sores in mouth, or a toothache) UNUSUAL RASH, SWELLING OR PAIN  UNUSUAL VAGINAL DISCHARGE OR ITCHING   Items with * indicate a potential emergency and should be followed up as soon as possible or go to the Emergency Department if any problems should occur.  Please show the CHEMOTHERAPY ALERT CARD or IMMUNOTHERAPY ALERT CARD at check-in  to the Emergency Department and triage nurse.  Should you have questions after your visit or need to cancel or reschedule your appointment, please contact [facility]  [phone] and follow the prompts.  Office hours are 8:00 a.m. to 4:30 p.m. Monday - Friday. Please note that voicemails left after 4:00 p.m. may not be returned until the following business day.  We are closed weekends and major holidays. You have access to a nurse at all times for urgent questions. Please call the main number to the clinic [phone] and follow the prompts.  For any non-urgent questions, you may also contact your provider using MyChart. We now offer e-Visits for anyone 72 and older to request care online for non-urgent symptoms. For details visit [email or web address].   Also download the MyChart app! Go to the app store, search "MyChart", open the app, select [facility], and log in with your MyChart username and password.  Due to Covid, a mask is required upon entering the hospital/clinic. If you do not have a mask, one will be given to you upon arrival. For doctor visits, patients may have 1 support person aged 44 or older with them. For treatment visits, patients cannot have anyone with them due to current Covid guidelines and our immunocompromised population.   Durvalumab injection What is this medication? DURVALUMAB (dur VAL ue mab) is a monoclonal antibody. It is used to treat lung cancer. This medicine may be used for other purposes; ask your health care provider or pharmacist if you have questions. COMMON BRAND NAME(S): IMFINZI What should I tell my care team before  I take this medication? They need to know if you have any of these conditions: autoimmune diseases like Crohn's disease, ulcerative colitis, or lupus have had or planning to have an allogeneic stem cell transplant (uses someone else's stem cells) history of organ transplant history of radiation to the  chest nervous system problems like myasthenia gravis or Guillain-Barre syndrome an unusual or allergic reaction to durvalumab, other medicines, foods, dyes, or preservatives pregnant or trying to get pregnant breast-feeding How should I use this medication? This medicine is for infusion into a vein. It is given by a health care professional in a hospital or clinic setting. A special MedGuide will be given to you before each treatment. Be sure to read this information carefully each time. Talk to your pediatrician regarding the use of this medicine in children. Special care may be needed. Overdosage: If you think you have taken too much of this medicine contact a poison control center or emergency room at once. NOTE: This medicine is only for you. Do not share this medicine with others. What if I miss a dose? It is important not to miss your dose. Call your doctor or health care professional if you are unable to keep an appointment. What may interact with this medication? Interactions have not been studied. This list may not describe all possible interactions. Give your health care provider a list of all the medicines, herbs, non-prescription drugs, or dietary supplements you use. Also tell them if you smoke, drink alcohol, or use illegal drugs. Some items may interact with your medicine. What should I watch for while using this medication? This medication may make you feel generally unwell. Continue your course of treatment even though you feel ill unless your care team tells you to stop. You may need blood work done while you are taking this medication. Do not become pregnant while taking this medication or for 3 months after stopping it. Women should inform their care team if they wish to become pregnant or think they might be pregnant. There is a potential for serious side effects to an unborn child. Talk to your care team or pharmacist for more information. Do not breast-feed an infant while  taking this medication or for 3 months after stopping it. What side effects may I notice from receiving this medication? Side effects that you should report to your care team as soon as possible: Allergic reactions--skin rash, itching, hives, swelling of the face, lips, tongue, or throat Bloody or watery diarrhea Dizziness, loss of balance or coordination, confusion or trouble speaking Dry cough, shortness of breath or trouble breathing Flushing, mostly over the face, neck, and chest, during injection High blood sugar (hyperglycemia)--increased thirst or amount of urine, unusual weakness or fatigue, blurry vision High thyroid levels (hyperthyroidism)--fast or irregular heartbeat, weight loss, excessive sweating or sensitivity to heat, tremors or shaking, anxiety, nervousness, irregular menstrual cycle or spotting Infection--fever, chills, cough, or sore throat Liver injury--right upper belly pain, loss of appetite, nausea, light-colored stool, dark yellow or brown urine, yellowing skin or eyes, unusual weakness or fatigue Low adrenal gland function--nausea, vomiting, loss of appetite, unusual weakness or fatigue, dizziness, low blood pressure Low thyroid levels (hypothyroidism)--unusual weakness or fatigue, increased sensitivity to cold, constipation, hair loss, dry skin, weight gain, feelings of depression Pancreatitis--severe stomach pain that spreads to your back or gets worse after eating or when touched, fever, nausea, vomiting Rash, fever, and swollen lymph nodes Redness, blistering, peeling or loosening of the skin, including inside the mouth [employer]  breathing with loud or whistling sounds Side effects that usually do not require medical attention (report these to your care team if they continue or are bothersome): Fatigue Hair loss This list may not describe all possible side effects. Call your doctor for medical advice about side effects. You may report side effects to FDA at  1-800-FDA-1088. Where should I keep my medication? This medication is given in a hospital or clinic. It will not be stored at home. NOTE: This sheet is a summary. It may not cover all possible information. If you have questions about this medicine, talk to your doctor, pharmacist, or health care provider.  2022 Elsevier/Gold Standard (2021-06-09 00:00:00)

## 2021-08-13 NOTE — Assessment & Plan Note (Addendum)
#   T2N3- Stage III-non-small cell lung cancer-[favor adenocarcinoma]- s/p  concurrent chemoradiation; Finished Feb 2022]; currently on adjuvant durvalumab. CT scan- NOV 7th, 2022-continued left lower lobe architectural distortion/fibrosis without any evidence of recurrent disease.  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  # proceed with IMFINZI; Labs today reviewed;  acceptable for treatment today. MAY 2022-TSH-WNL.   #CT scan-August 10, 2021- medial right middle lobe 3 mm nodule is new since the prior exam-monitor for now.  Further imaging down the line.  # left-sided pleural effusion-CT November 2022 stable.  Hold off any thoracentesis.  # A. fib on Eliquis [Dr.Kowalski]-  Mild swelling in legs- EF in Feb 2022- 55-60%; continue BIL compression stocking/leg elevation.  Stable.  # Vaccination counselling:s/p  #3 booster S/p  Pneumonia shot; s/p flu shot.  # DISPOSITION: TG-3w # proceed with  IMFINZI today; # follow up in 3 weeks-MD; labs- cbc/cmp;;IMFINZI;Dr.B

## 2021-08-18 ENCOUNTER — Other Ambulatory Visit: Payer: Self-pay

## 2021-08-18 ENCOUNTER — Telehealth: Payer: Self-pay | Admitting: *Deleted

## 2021-08-18 ENCOUNTER — Inpatient Hospital Stay (HOSPITAL_BASED_OUTPATIENT_CLINIC_OR_DEPARTMENT_OTHER): Payer: Medicare Other | Admitting: Hospice and Palliative Medicine

## 2021-08-18 DIAGNOSIS — J22 Unspecified acute lower respiratory infection: Secondary | ICD-10-CM

## 2021-08-18 MED ORDER — BENZONATATE 100 MG PO CAPS: 100.0000 mg | ORAL_CAPSULE | Freq: Three times a day (TID) | ORAL | 0 refills | Status: DC | PRN

## 2021-08-18 MED ORDER — AMOXICILLIN-POT CLAVULANATE 875-125 MG PO TABS: 1.0000 | ORAL_TABLET | Freq: Two times a day (BID) | ORAL | 0 refills | Status: DC

## 2021-08-18 NOTE — Progress Notes (Signed)
Virtual Visit via Telephone  Note  I connected with Jason Warren on 08/18/21 at  2:30 PM EST by telephone and verified that was speaking with the correct person using two identifiers.  Location: Patient: Home Provider: Clinic   I discussed the limitations of evaluation and management by telemedicine and the availability of in person appointments. The patient expressed understanding and agreed to proceed.  History of Present Illness: Jason Warren is a 87 year old male with multiple medical problems including stage III lung cancer status post concurrent chemoradiation (finished February 2022) currently on adjuvant durvalumab.  CT scan from August 10, 2021 showed continued left lower lobe architectural distortion/fibrosis without any evidence of recurrent disease.  Patient was noted to have stable left-sided pleural effusion.  Patient requested virtual [facility] visit today to evaluate for cough/congestion over the past 2 weeks.   Observations/Objective: Patient endorses 2 weeks of productive cough with yellow phlegm.  He also endorses rhinorrhea but denies postnasal drip or sore throat.  Patient denies  shortness of breath or wheezing.  No facial pain or tenderness.  No ear pain.  No fever or chills.  No GI symptoms.  Patient reports that he is congested and able to expectorate during the day when he is moving around but feels worsening congestion/cough at night, which is keeping him awake.  He has been taking Robitussin without significant improvement.  He does not feel like the symptoms are worsening but have persisted over the past 10 days or so.  Patient is vaccinated from [place].  Assessment and Plan: Respiratory infection -I would generally recommend testing for COVID/flu but patient is well outside the window for effective treatment options.  We will start patient empirically on antibiotics given the duration of symptoms.  We also discussed symptomatic care including increasing fluids and  adding guaifenesin.  We will start patient on an antitussive.  Discussed triggers for ER utilization  Follow Up Instructions: As needed   I discussed the assessment and treatment plan with the patient. The patient was provided an opportunity to ask questions and all were answered. The patient agreed with the plan and demonstrated an understanding of the instructions.   The patient was advised to call back or seek an in-person evaluation if the symptoms worsen or if the condition fails to improve as anticipated.  I provided 10 minutes of non-face-to-face time during this encounter.   Irean Hong, NP

## 2021-08-18 NOTE — Telephone Encounter (Signed)
Patient called reporting that he needs prescription for congestion and coughing. He reports that the congestion started in his chest 5 days ago and he now has nasal as well as chest congestion and is coughing up a thick dark yellow sputum. He denies any fever. He wife told me when she answered the phone that he was coughing his head off. Please advise

## 2021-09-02 ENCOUNTER — Other Ambulatory Visit: Payer: Self-pay | Admitting: Internal Medicine

## 2021-09-02 DIAGNOSIS — C3432 Malignant neoplasm of lower lobe, left bronchus or lung: Secondary | ICD-10-CM

## 2021-09-03 ENCOUNTER — Inpatient Hospital Stay: Payer: Medicare Other | Attending: Internal Medicine

## 2021-09-03 ENCOUNTER — Inpatient Hospital Stay (HOSPITAL_BASED_OUTPATIENT_CLINIC_OR_DEPARTMENT_OTHER): Payer: Medicare Other | Admitting: Internal Medicine

## 2021-09-03 ENCOUNTER — Other Ambulatory Visit: Payer: Self-pay

## 2021-09-03 ENCOUNTER — Inpatient Hospital Stay: Payer: Medicare Other

## 2021-09-03 ENCOUNTER — Encounter: Payer: Self-pay | Admitting: Internal Medicine

## 2021-09-03 DIAGNOSIS — Z79899 Other long term (current) drug therapy: Secondary | ICD-10-CM | POA: Diagnosis not present

## 2021-09-03 DIAGNOSIS — M8580 Other specified disorders of bone density and structure, unspecified site: Secondary | ICD-10-CM | POA: Insufficient documentation

## 2021-09-03 DIAGNOSIS — M1612 Unilateral primary osteoarthritis, left hip: Secondary | ICD-10-CM | POA: Insufficient documentation

## 2021-09-03 DIAGNOSIS — J90 Pleural effusion, not elsewhere classified: Secondary | ICD-10-CM | POA: Insufficient documentation

## 2021-09-03 DIAGNOSIS — M7989 Other specified soft tissue disorders: Secondary | ICD-10-CM | POA: Diagnosis not present

## 2021-09-03 DIAGNOSIS — I4891 Unspecified atrial fibrillation: Secondary | ICD-10-CM | POA: Insufficient documentation

## 2021-09-03 DIAGNOSIS — C3432 Malignant neoplasm of lower lobe, left bronchus or lung: Secondary | ICD-10-CM | POA: Insufficient documentation

## 2021-09-03 DIAGNOSIS — R748 Abnormal levels of other serum enzymes: Secondary | ICD-10-CM | POA: Diagnosis not present

## 2021-09-03 DIAGNOSIS — Z09 Encounter for follow-up examination after completed treatment for conditions other than malignant neoplasm: Secondary | ICD-10-CM

## 2021-09-03 DIAGNOSIS — Z5112 Encounter for antineoplastic immunotherapy: Secondary | ICD-10-CM | POA: Insufficient documentation

## 2021-09-03 DIAGNOSIS — Z7901 Long term (current) use of anticoagulants: Secondary | ICD-10-CM | POA: Insufficient documentation

## 2021-09-03 DIAGNOSIS — R0602 Shortness of breath: Secondary | ICD-10-CM

## 2021-09-03 DIAGNOSIS — Z85828 Personal history of other malignant neoplasm of skin: Secondary | ICD-10-CM | POA: Diagnosis not present

## 2021-09-03 DIAGNOSIS — I700 Atherosclerosis of aorta: Secondary | ICD-10-CM | POA: Insufficient documentation

## 2021-09-03 DIAGNOSIS — R0689 Other abnormalities of breathing: Secondary | ICD-10-CM | POA: Diagnosis not present

## 2021-09-03 DIAGNOSIS — I2510 Atherosclerotic heart disease of native coronary artery without angina pectoris: Secondary | ICD-10-CM | POA: Diagnosis not present

## 2021-09-03 LAB — COMPREHENSIVE METABOLIC PANEL
ALT: 20 U/L (ref 0–44)
AST: 26 U/L (ref 15–41)
Albumin: 4 g/dL (ref 3.5–5.0)
Alkaline Phosphatase: 142 U/L — ABNORMAL HIGH (ref 38–126)
Anion gap: 10 (ref 5–15)
BUN: 21 mg/dL (ref 8–23)
CO2: 25 mmol/L (ref 22–32)
Calcium: 8.7 mg/dL — ABNORMAL LOW (ref 8.9–10.3)
Chloride: 102 mmol/L (ref 98–111)
Creatinine, Ser: 1.09 mg/dL (ref 0.61–1.24)
GFR, Estimated: >60 mL/min (ref >60)
Glucose, Bld: 125 mg/dL — ABNORMAL HIGH (ref 70–99)
Potassium: 4.1 mmol/L (ref 3.5–5.1)
Sodium: 137 mmol/L (ref 135–145)
Total Bilirubin: 0.8 mg/dL (ref 0.3–1.2)
Total Protein: 7.4 g/dL (ref 6.5–8.1)

## 2021-09-03 LAB — CBC WITH DIFFERENTIAL/PLATELET
Abs Immature Granulocytes: 0.02 10*3/uL (ref 0.00–0.07)
Basophils Absolute: 0 10*3/uL (ref 0.0–0.1)
Basophils Relative: 0 %
Eosinophils Absolute: 0.2 10*3/uL (ref 0.0–0.5)
Eosinophils Relative: 3 %
HCT: 42.4 % (ref 39.0–52.0)
Hemoglobin: 14.2 g/dL (ref 13.0–17.0)
Immature Granulocytes: 0 %
Lymphocytes Relative: 14 %
Lymphs Abs: 1 10*3/uL (ref 0.7–4.0)
MCH: 32.6 pg (ref 26.0–34.0)
MCHC: 33.5 g/dL (ref 30.0–36.0)
MCV: 97.2 fL (ref 80.0–100.0)
Monocytes Absolute: 0.8 10*3/uL (ref 0.1–1.0)
Monocytes Relative: 10 %
Neutro Abs: 5.4 10*3/uL (ref 1.7–7.7)
Neutrophils Relative %: 73 %
Platelets: 219 10*3/uL (ref 150–400)
RBC: 4.36 MIL/uL (ref 4.22–5.81)
RDW: 13.6 % (ref 11.5–15.5)
WBC: 7.4 10*3/uL (ref 4.0–10.5)
nRBC: 0 % (ref 0.0–0.2)

## 2021-09-03 LAB — BRAIN NATRIURETIC PEPTIDE: B Natriuretic Peptide: 380.4 pg/mL — ABNORMAL HIGH (ref 0.0–100.0)

## 2021-09-03 LAB — TSH: TSH: 3.465 u[IU]/mL (ref 0.350–4.500)

## 2021-09-03 MED ORDER — SODIUM CHLORIDE 0.9% FLUSH
10.0000 mL | INTRAVENOUS | Status: DC | PRN
  Filled 2021-09-03: qty 10

## 2021-09-03 MED ORDER — HEPARIN SOD (PORK) LOCK FLUSH 100 UNIT/ML IV SOLN
500.0000 [IU] | Freq: Once | INTRAVENOUS | Status: DC
  Filled 2021-09-03: qty 5

## 2021-09-03 MED ORDER — SODIUM CHLORIDE 0.9 % IV SOLN
10.0000 mg/kg | Freq: Once | INTRAVENOUS | Status: AC
  Administered 2021-09-03: 740 mg via INTRAVENOUS
  Filled 2021-09-03: qty 10

## 2021-09-03 MED ORDER — HEPARIN SOD (PORK) LOCK FLUSH 100 UNIT/ML IV SOLN
INTRAVENOUS | Status: AC
  Administered 2021-09-03: 500 [IU]
  Filled 2021-09-03: qty 5

## 2021-09-03 MED ORDER — SODIUM CHLORIDE 0.9 % IV SOLN
Freq: Once | INTRAVENOUS | Status: AC
Start: 2021-09-03 — End: 2021-09-03
  Filled 2021-09-03: qty 250

## 2021-09-03 MED ORDER — HEPARIN SOD (PORK) LOCK FLUSH 100 UNIT/ML IV SOLN
500.0000 [IU] | Freq: Once | INTRAVENOUS | Status: AC | PRN
  Filled 2021-09-03: qty 5

## 2021-09-03 NOTE — Patient Instructions (Signed)
[facility] AT [facility]  Discharge Instructions: Thank you for choosing [facility] to provide your oncology and hematology care.  If you have a lab appointment with the [facility], please go directly to the [facility] and check in at the registration area.  Wear comfortable clothing and clothing appropriate for easy access to any Portacath or PICC line.   We strive to give you quality time with your provider. You may need to reschedule your appointment if you arrive late (15 or more minutes).  Arriving late affects you and other patients whose appointments are after yours.  Also, if you miss three or more appointments without notifying the office, you may be dismissed from the clinic at the provider's discretion.      For prescription refill requests, have your pharmacy contact our office and allow 72 hours for refills to be completed.    Today you received the following chemotherapy and/or immunotherapy agents : Imfinzi      To help prevent nausea and vomiting after your treatment, we encourage you to take your nausea medication as directed.  BELOW ARE SYMPTOMS THAT SHOULD BE REPORTED IMMEDIATELY: *FEVER GREATER THAN 100.4 F (38 C) OR HIGHER *CHILLS OR SWEATING *NAUSEA AND VOMITING THAT IS NOT CONTROLLED WITH YOUR NAUSEA MEDICATION *UNUSUAL SHORTNESS OF BREATH *UNUSUAL BRUISING OR BLEEDING *URINARY PROBLEMS (pain or burning when urinating, or frequent urination) *BOWEL PROBLEMS (unusual diarrhea, constipation, pain near the anus) TENDERNESS IN MOUTH AND THROAT WITH OR WITHOUT PRESENCE OF ULCERS (sore throat, sores in mouth, or a toothache) UNUSUAL RASH, SWELLING OR PAIN  UNUSUAL VAGINAL DISCHARGE OR ITCHING   Items with * indicate a potential emergency and should be followed up as soon as possible or go to the Emergency Department if any problems should occur.  Please show the CHEMOTHERAPY ALERT CARD or IMMUNOTHERAPY ALERT CARD at check-in to  the Emergency Department and triage nurse.  Should you have questions after your visit or need to cancel or reschedule your appointment, please contact [facility] [place] [place] [facility]  [phone] and follow the prompts.  Office hours are 8:00 a.m. to 4:30 p.m. Monday - Friday. Please note that voicemails left after 4:00 p.m. may not be returned until the following business day.  We are closed weekends and major holidays. You have access to a nurse at all times for urgent questions. Please call the main number to the clinic [phone] and follow the prompts.  For any non-urgent questions, you may also contact your provider using MyChart. We now offer e-Visits for anyone 75 and older to request care online for non-urgent symptoms. For details visit [email or web address].   Also download the MyChart app! Go to the app store, search "MyChart", open the app, select [facility], and log in with your MyChart username and password.  Due to Covid, a mask is required upon entering the hospital/clinic. If you do not have a mask, one will be given to you upon arrival. For doctor visits, patients may have 1 support person aged 2 or older with them. For treatment visits, patients cannot have anyone with them due to current Covid guidelines and our immunocompromised population.

## 2021-09-03 NOTE — Assessment & Plan Note (Addendum)
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 CT scan- NOV 7th, 2022-continued left lower lobe architectural distortion/fibrosis without any evidence of recurrent disease.  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  # proceed with IMFINZI; Labs today reviewed;  acceptable for treatment today. MAY 2022-TSH-WNL.   #CT scan-August 10, 2021- medial right middle lobe 3 mm nodule is new since the prior exam-monitor for now. Stable.  # Mild evekation of Alk phos-? From Imfinzi- monitor for now.   # left-sided pleural effusion-CT November 2022. Hold off any thoracentesis- Stable.  # A. fib on Eliquis [Dr.Kowalski]-  Mild swelling in legs- EF in Feb 2022- 55-60%; continue BIL compression stocking/leg elevation.  Stable.  # Vaccination counselling:s/p  #3 booster S/p  Pneumonia shot; s/p flu shot.  # DISPOSITION: # proceed with  IMFINZI today; # follow up in 2 weeks-NP; labs- cbc/cmp;;IMFINZI; # follow up in 4 weeks-MD labs- cbc/cmp;;IMFINZI;Dr.B

## 2021-09-03 NOTE — Progress Notes (Signed)
Pt in for follow up, states had an infection a few weeks ago and Josh Borders NP had pt on augmentin and tessalon which he has completed.

## 2021-09-03 NOTE — Progress Notes (Signed)
[employer] NOTE  Patient Care Team: Baxter Hire, MD as PCP - General (Internal Medicine) [name], RN as Oncology Nurse Navigator [name], MD as Consulting Physician (Hematology and Oncology)  CHIEF COMPLAINTS/PURPOSE OF CONSULTATION: Lung cancer  #  Oncology History Overview Note  # NOV 2021- LEFT LOWER LOBE NON-SMALL CELL CA [favor adeno ca]; T2N3 [right hilar; subcarinal LN;[name]; NOV 2021-MRI [place]  # 11/30- carbo-Taxol-RT [RT until 10/23/20]  # Right lower lobe - ~4.6 x 3.1 cm  [PET 07-2020]- demonstrates no significant hypermetabolic activity (SUV max 1.8]-benign. STABLE.   # A.fib [Eliquis; Dr.Kowalski]  # # SURVIVORSHIP:   # GENETICS:   #NGS-ordered  DIAGNOSIS: Lung cancer  STAGE:   III      ;  GOALS:  cure  CURRENT/MOST RECENT THERAPY : carbo-Taxol-RT ]   Cancer of lower lobe of left lung ([place])  08/14/2020 Initial Diagnosis   Cancer of lower lobe of left lung ([place])   09/02/2020 - 10/22/2020 Chemotherapy    Patient is on Treatment Plan: LUNG DURVALUMAB Q14D       10/16/2020 Cancer Staging   Staging form: Lung, AJCC 8th Edition - Clinical: Stage IIIB (cT2a, cN3, cM0) - Signed by [name], MD on 10/16/2020    12/17/2020 -  Chemotherapy   Patient is on Treatment Plan : LUNG Durvalumab q14d      HISTORY OF PRESENTING ILLNESS: Walking independently but with a limp because of left hip arthritis.  He is alone.  Jason Warren 87 y.o.  male patient with stage III lung non-small cell lung-currently adjuvant duvalumab is here for follow-up.  In the interim patient-diagnosed with URI s/p Augmentin symptoms resolved.  Patient denies any new symptoms. Patient denies any shortness of breath or cough. Denies any worsening swelling in the legs-improved on compression stockings..  No new chest pain or nausea vomiting diarrhea.  Review of Systems  Constitutional:  Negative for chills, diaphoresis,  fever, malaise/fatigue and weight loss.  HENT:  Negative for nosebleeds and sore throat.   Eyes:  Negative for double vision.  Respiratory:  Negative for hemoptysis, sputum production, shortness of breath and wheezing.   Cardiovascular:  Negative for chest pain, palpitations, orthopnea and leg swelling.  Gastrointestinal:  Negative for abdominal pain, blood in stool, constipation, diarrhea, heartburn, melena, nausea and vomiting.  Genitourinary:  Negative for dysuria, frequency and urgency.  Musculoskeletal:  Negative for back pain and joint pain.  Skin:  Negative for itching.  Neurological:  Negative for tingling, focal weakness, weakness and headaches.  Endo/Heme/Allergies:  Does not bruise/bleed easily.  Psychiatric/Behavioral:  Negative for depression. The patient is not nervous/anxious and does not have insomnia.     MEDICAL HISTORY:  Past Medical History:  Diagnosis Date   Arthritis    Dysrhythmia    A-fib   Hip pain    left   HOH (hard of hearing)    LBBB (left bundle branch block) 08/05/2020   Lung cancer ([place])    Skin cancer, basal cell     face top of head    SURGICAL HISTORY: Past Surgical History:  Procedure Laterality Date   CATARACT EXTRACTION W/PHACO Right 07/13/2017   Procedure: CATARACT EXTRACTION PHACO AND INTRAOCULAR LENS PLACEMENT ([place]);  Surgeon: Leandrew Koyanagi, MD;  Location: [facility];  Service: Ophthalmology;  Laterality: Right;  IVA TOPICAL RIGHT   CATARACT EXTRACTION W/PHACO Left 01/04/2018   Procedure: CATARACT EXTRACTION PHACO AND INTRAOCULAR LENS PLACEMENT ([facility]) LEFT;  Surgeon: Wallace Going,  Nila Nephew, MD;  Location: [facility];  Service: Ophthalmology;  Laterality: Left;   COLONOSCOPY     IR IMAGING GUIDED PORT INSERTION  08/27/2020   JOINT REPLACEMENT     Left total hip Dr. Su Hoff 08-04-18   ROTATOR CUFF REPAIR Right    SKIN CANCER EXCISION     face   TOTAL HIP ARTHROPLASTY Left 08/04/2018   Procedure: TOTAL HIP  ARTHROPLASTY ANTERIOR APPROACH;  Surgeon: Frederik Pear, MD;  Location: [facility];  Service: Orthopedics;  Laterality: Left;   VIDEO BRONCHOSCOPY WITH ENDOBRONCHIAL NAVIGATION N/A 08/07/2020   Procedure: VIDEO BRONCHOSCOPY WITH ENDOBRONCHIAL NAVIGATION;  Surgeon: Ottie Glazier, MD;  Location: [facility];  Service: Thoracic;  Laterality: N/A;   VIDEO BRONCHOSCOPY WITH ENDOBRONCHIAL ULTRASOUND N/A 08/07/2020   Procedure: VIDEO BRONCHOSCOPY WITH ENDOBRONCHIAL ULTRASOUND;  Surgeon: Ottie Glazier, MD;  Location: [facility];  Service: Thoracic;  Laterality: N/A;    SOCIAL HISTORY: Social History   Socioeconomic History   Marital status: Married    Spouse name: Not on file   Number of children: Not on file   Years of education: Not on file   Highest education level: Not on file  Occupational History   Not on file  Tobacco Use   Smoking status: Former    Packs/day: 1.00    Years: 4.00    Pack years: 4.00    Types: Cigarettes   Smokeless tobacco: Never   Tobacco comments:    quit early 70's  Vaping Use   Vaping Use: Never used  Substance and Sexual Activity   Alcohol use: No   Drug use: No   Sexual activity: Not Currently  Other Topics Concern   Not on file  Social History Narrative   > quit 35 years; smoked for 15 years. Rare alcohol. 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    Social Determinants of Health   Financial Resource Strain: Not on file  Food Insecurity: Not on file  Transportation Needs: Not on file  Physical Activity: Not on file  Stress: Not on file  Social Connections: Not on file  Intimate Partner Violence: Not on file    FAMILY HISTORY: Family History  Problem Relation Age of Onset   Throat cancer Brother         & lung cancer    ALLERGIES:  has No Known Allergies.  MEDICATIONS:  Current Outpatient Medications  Medication Sig Dispense Refill   acetaminophen (TYLENOL) 500 MG tablet Take 500  mg by mouth every 8 (eight) hours as needed for moderate pain.     apixaban (ELIQUIS) 5 MG TABS tablet Take 5 mg by mouth 2 (two) times daily.     hydroxypropyl methylcellulose / hypromellose (ISOPTO TEARS / GONIOVISC) 2.5 % ophthalmic solution Place 1 drop into the left eye daily as needed for dry eyes.     lidocaine-prilocaine (EMLA) cream Apply 1 application topically as needed. Apply small amount to port site at least 1 hour prior to it being accessed, cover with plastic wrap 30 g 1   Multiple Vitamin (MULTIVITAMIN) tablet Take 1 tablet by mouth daily.     Polyethyl Glycol-Propyl Glycol (SYSTANE FREE OP) Apply 1 drop to eye as needed.     pravastatin (PRAVACHOL) 80 MG tablet Take 80 mg by mouth at bedtime.      saw palmetto 160 MG capsule Take 160 mg by mouth 2 (two) times daily.  No current facility-administered medications for this visit.   Facility-Administered Medications Ordered in Other Visits  Medication Dose Route Frequency Provider Last Rate Last Admin   heparin lock flush 100 UNIT/ML injection            sodium chloride flush (NS) 0.9 % injection 10 mL  10 mL Intravenous PRN [name], MD   10 mL at 02/11/21 0835   PHYSICAL EXAMINATION: ECOG PERFORMANCE STATUS: 1 - Symptomatic but completely ambulatory  Vitals:   09/03/21 0919  BP: 128/69  Pulse: 65  Resp: 16  Temp: (!) 97.1 F (36.2 C)  SpO2: 100%   Filed Weights   09/03/21 0919  Weight: 158 lb (71.7 kg)    Physical Exam HENT:     Head: Normocephalic and atraumatic.     Mouth/Throat:     Pharynx: No oropharyngeal exudate.  Eyes:     Pupils: Pupils are equal, round, and reactive to light.  Cardiovascular:     Rate and Rhythm: Normal rate and regular rhythm.  Pulmonary:     Effort: Pulmonary effort is normal. No respiratory distress.     Breath sounds: No wheezing.     Comments: Slightly decreased breath sounds left lower lung base. Abdominal:     General: Bowel sounds are normal. There is no  distension.     Palpations: Abdomen is soft. There is no mass.     Tenderness: There is no abdominal tenderness. There is no guarding or rebound.  Musculoskeletal:        General: No tenderness. Normal range of motion.     Cervical back: Normal range of motion and neck supple.  Skin:    General: Skin is warm.  Neurological:     Mental Status: He is alert and oriented to person, place, and time.  Psychiatric:        Mood and Affect: Affect normal.     LABORATORY DATA:  I have reviewed the data as listed Lab Results  Component Value Date   WBC 7.4 09/03/2021   HGB 14.2 09/03/2021   HCT 42.4 09/03/2021   MCV 97.2 09/03/2021   PLT 219 09/03/2021   Recent Labs    07/30/21 0835 08/13/21 0858 09/03/21 0835  NA 137 138 137  K 4.1 4.1 4.1  CL 103 102 102  CO2 _0 GLUCOSE 129* 123* 125*  BUN 26* 18 21  CREATININE 0.87 0.94 1.09  CALCIUM 9.1 8.9 8.7*  GFRNONAA >60 >60 >60  PROT 7.3 7.5 7.4  ALBUMIN 4.3 4.0 4.0  AST _1 ALT _2 ALKPHOS 107 133* 142*  BILITOT 0.9 0.9 0.8    RADIOGRAPHIC STUDIES: I have personally reviewed the radiological images as listed and agreed with the findings in the report. CT CHEST WO CONTRAST  Result Date: 08/06/2021 CLINICAL DATA:  Restaging of left lower lobe lung cancer diagnosed last October. Status post chemotherapy, radiation therapy and immunotherapy. Left lower lobe non-small cell. EXAM: CT CHEST WITHOUT CONTRAST TECHNIQUE: Multidetector CT imaging of the chest was performed following the standard protocol without IV contrast. COMPARISON:  05/20/2021.  Clinic note 07/30/2021. FINDINGS: Cardiovascular: Right Port-A-Cath tip high right atrium. Aortic atherosclerosis. Tortuous thoracic aorta. Mild cardiomegaly, without pericardial. Lad and right coronary artery calcification. Mediastinum/Nodes: No supraclavicular adenopathy. No mediastinal or definite hilar adenopathy, given limitations of unenhanced CT. Lungs/Pleura: Small left  pleural effusion with minimal loculation medially and laterally, similar. 3 mm right middle lobe pulmonary nodule with  surrounding ground-glass on 104/3, new since the prior. A 4 mm more anterior right middle lobe nodule on 96/3 is unchanged. Architectural distortion, volume loss surrounding right lower lobe dilated bronchi, possibly treatment related. Somewhat amorphous and difficult to measure, but on the order of 3.4 x 2.4 cm on 92/3 versus 3.3 x 2.5 cm in the same level on the prior. 2 mm right lower lobe subpleural pulmonary nodule on 99/3, similar. Similar appearance of left perihilar volume loss and architectural distortion with traction bronchiectasis. No well-defined residual or recurrent mass. Upper Abdomen: Normal imaged portions of the liver, spleen, stomach pancreas, gallbladder, adrenal glands. Colonic stool burden suggests constipation. Low-density upper and interpolar right renal lesions are likely cysts including up to 2.2 cm. Musculoskeletal: Osteopenia. Remote sternal body fracture. Mild to moderate L1 compression deformity is unchanged. Mild superior endplate compression deformity at T12 is also chronic. IMPRESSION: 1. Similar appearance of left perihilar presumably radiation induced fibrosis and architectural distortion. No evidence of recurrent or residual disease. 2. Right lower lobe peribronchovascular architectural distortion is also unchanged and may be treatment related. 3. Primarily similar tiny pulmonary nodules. A medial right middle lobe 3 mm nodule is new since the prior exam, warranting follow-up attention. 4.  Possible constipation. 5. Coronary artery atherosclerosis. Aortic Atherosclerosis (ICD10-I70.0). 6. Similar small left pleural Electronically Signed   By: Abigail Miyamoto M.D.   On: 08/06/2021 16:09     ASSESSMENT & PLAN:   Cancer of lower lobe of left lung ([place]) # T2N3- Stage III-non-small cell lung cancer-[favor adenocarcinoma]- s/p  concurrent chemoradiation; Finished  Feb 2022]; currently on adjuvant durvalumab. CT scan- NOV 7th, 2022-continued left lower lobe architectural distortion/fibrosis without any evidence of recurrent disease.  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  # proceed with IMFINZI; Labs today reviewed;  acceptable for treatment today. MAY 2022-TSH-WNL.   #CT scan-August 10, 2021- medial right middle lobe 3 mm nodule is new since the prior exam-monitor for now. Stable.  # Mild evekation of Alk phos-? From Imfinzi- monitor for now.   # left-sided pleural effusion-CT November 2022. Hold off any thoracentesis- Stable.  # A. fib on Eliquis [Dr.Kowalski]-  Mild swelling in legs- EF in Feb 2022- 55-60%; continue BIL compression stocking/leg elevation.  Stable.  # Vaccination counselling:s/p  #3 booster S/p  Pneumonia shot; s/p flu shot.  # DISPOSITION: # proceed with  IMFINZI today; # follow up in 2 weeks-NP; labs- cbc/cmp;;IMFINZI; # follow up in 4 weeks-MD labs- cbc/cmp;;IMFINZI;Dr.B   All questions were answered. The patient knows to call the clinic with any problems, questions or concerns.   [name], MD 09/03/2021 12:16 PM

## 2021-09-17 ENCOUNTER — Encounter: Payer: Self-pay | Admitting: Oncology

## 2021-09-17 ENCOUNTER — Inpatient Hospital Stay: Payer: Medicare Other

## 2021-09-17 ENCOUNTER — Other Ambulatory Visit: Payer: Self-pay

## 2021-09-17 ENCOUNTER — Inpatient Hospital Stay (HOSPITAL_BASED_OUTPATIENT_CLINIC_OR_DEPARTMENT_OTHER): Payer: Medicare Other | Admitting: Oncology

## 2021-09-17 VITALS — BP 129/68 | HR 73 | Temp 96.7°F | Resp 16 | Wt 160.8 lb

## 2021-09-17 DIAGNOSIS — C3432 Malignant neoplasm of lower lobe, left bronchus or lung: Secondary | ICD-10-CM

## 2021-09-17 DIAGNOSIS — Z5112 Encounter for antineoplastic immunotherapy: Secondary | ICD-10-CM | POA: Diagnosis not present

## 2021-09-17 DIAGNOSIS — Z95828 Presence of other vascular implants and grafts: Secondary | ICD-10-CM

## 2021-09-17 LAB — COMPREHENSIVE METABOLIC PANEL
ALT: 23 U/L (ref 0–44)
AST: 32 U/L (ref 15–41)
Albumin: 3.8 g/dL (ref 3.5–5.0)
Alkaline Phosphatase: 143 U/L — ABNORMAL HIGH (ref 38–126)
Anion gap: 10 (ref 5–15)
BUN: 22 mg/dL (ref 8–23)
CO2: 24 mmol/L (ref 22–32)
Calcium: 8.7 mg/dL — ABNORMAL LOW (ref 8.9–10.3)
Chloride: 99 mmol/L (ref 98–111)
Creatinine, Ser: 0.98 mg/dL (ref 0.61–1.24)
GFR, Estimated: >60 mL/min (ref >60)
Glucose, Bld: 143 mg/dL — ABNORMAL HIGH (ref 70–99)
Potassium: 4.3 mmol/L (ref 3.5–5.1)
Sodium: 133 mmol/L — ABNORMAL LOW (ref 135–145)
Total Bilirubin: 0.9 mg/dL (ref 0.3–1.2)
Total Protein: 7.3 g/dL (ref 6.5–8.1)

## 2021-09-17 LAB — CBC WITH DIFFERENTIAL/PLATELET
Abs Immature Granulocytes: 0.03 10*3/uL (ref 0.00–0.07)
Basophils Absolute: 0.1 10*3/uL (ref 0.0–0.1)
Basophils Relative: 1 %
Eosinophils Absolute: 0.4 10*3/uL (ref 0.0–0.5)
Eosinophils Relative: 5 %
HCT: 41.2 % (ref 39.0–52.0)
Hemoglobin: 13.7 g/dL (ref 13.0–17.0)
Immature Granulocytes: 0 %
Lymphocytes Relative: 17 %
Lymphs Abs: 1.2 10*3/uL (ref 0.7–4.0)
MCH: 32.2 pg (ref 26.0–34.0)
MCHC: 33.3 g/dL (ref 30.0–36.0)
MCV: 96.7 fL (ref 80.0–100.0)
Monocytes Absolute: 0.6 10*3/uL (ref 0.1–1.0)
Monocytes Relative: 8 %
Neutro Abs: 5.1 10*3/uL (ref 1.7–7.7)
Neutrophils Relative %: 69 %
Platelets: 305 10*3/uL (ref 150–400)
RBC: 4.26 MIL/uL (ref 4.22–5.81)
RDW: 13.5 % (ref 11.5–15.5)
WBC: 7.3 10*3/uL (ref 4.0–10.5)
nRBC: 0 % (ref 0.0–0.2)

## 2021-09-17 MED ORDER — HEPARIN SOD (PORK) LOCK FLUSH 100 UNIT/ML IV SOLN
500.0000 [IU] | Freq: Once | INTRAVENOUS | Status: DC
  Filled 2021-09-17: qty 5

## 2021-09-17 MED ORDER — SODIUM CHLORIDE 0.9 % IV SOLN
Freq: Once | INTRAVENOUS | Status: AC
  Filled 2021-09-17: qty 250

## 2021-09-17 MED ORDER — SODIUM CHLORIDE 0.9 % IV SOLN
10.0000 mg/kg | Freq: Once | INTRAVENOUS | Status: AC
  Administered 2021-09-17: 740 mg via INTRAVENOUS
  Filled 2021-09-17: qty 10

## 2021-09-17 MED ORDER — HEPARIN SOD (PORK) LOCK FLUSH 100 UNIT/ML IV SOLN
500.0000 [IU] | Freq: Once | INTRAVENOUS | Status: AC
  Administered 2021-09-17: 500 [IU] via INTRAVENOUS
  Filled 2021-09-17: qty 5

## 2021-09-17 MED ORDER — SODIUM CHLORIDE 0.9% FLUSH
10.0000 mL | Freq: Once | INTRAVENOUS | Status: AC
  Administered 2021-09-17: 10 mL via INTRAVENOUS
  Filled 2021-09-17: qty 10

## 2021-09-17 NOTE — Patient Instructions (Signed)
[facility] [place]  Discharge Instructions: Thank you for choosing [facility] to provide your oncology and hematology care.  If you have a lab appointment with the [facility], please go directly to the [facility] and check in at the registration area.  Wear comfortable clothing and clothing appropriate for easy access to any Portacath or PICC line.   We strive to give you quality time with your provider. You may need to reschedule your appointment if you arrive late (15 or more minutes).  Arriving late affects you and other patients whose appointments are after yours.  Also, if you miss three or more appointments without notifying the office, you may be dismissed from the clinic at the providers discretion.      For prescription refill requests, have your pharmacy contact our office and allow 72 hours for refills to be completed.    Today you received the following chemotherapy and/or immunotherapy agents IMFINZI      To help prevent nausea and vomiting after your treatment, we encourage you to take your nausea medication as directed.  BELOW ARE SYMPTOMS THAT SHOULD BE REPORTED IMMEDIATELY: *FEVER GREATER THAN 100.4 F (38 C) OR HIGHER *CHILLS OR SWEATING *NAUSEA AND VOMITING THAT IS NOT CONTROLLED WITH YOUR NAUSEA MEDICATION *UNUSUAL SHORTNESS OF BREATH *UNUSUAL BRUISING OR BLEEDING *URINARY PROBLEMS (pain or burning when urinating, or frequent urination) *BOWEL PROBLEMS (unusual diarrhea, constipation, pain near the anus) TENDERNESS IN MOUTH AND THROAT WITH OR WITHOUT PRESENCE OF ULCERS (sore throat, sores in mouth, or a toothache) UNUSUAL RASH, SWELLING OR PAIN  UNUSUAL VAGINAL DISCHARGE OR ITCHING   Items with * indicate a potential emergency and should be followed up as soon as possible or go to the Emergency Department if any problems should occur.  Please show the CHEMOTHERAPY ALERT CARD or IMMUNOTHERAPY ALERT CARD at check-in to the  Emergency Department and triage nurse.  Should you have questions after your visit or need to cancel or reschedule your appointment, please contact [facility] [place] [facility] [place]  [phone] and follow the prompts.  Office hours are 8:00 a.m. to 4:30 p.m. Monday - Friday. Please note that voicemails left after 4:00 p.m. may not be returned until the following business day.  We are closed weekends and major holidays. You have access to a nurse at all times for urgent questions. Please call the main number to the clinic [phone] and follow the prompts.  For any non-urgent questions, you may also contact your provider using MyChart. We now offer e-Visits for anyone 67 and older to request care online for non-urgent symptoms. For details visit [email or web address].   Also download the MyChart app! Go to the app store, search "MyChart", open the app, select [facility], and log in with your MyChart username and password.  Due to Covid, a mask is required upon entering the hospital/clinic. If you do not have a mask, one will be given to you upon arrival. For doctor visits, patients may have 1 support person aged 49 or older with them. For treatment visits, patients cannot have anyone with them due to current Covid guidelines and our immunocompromised population.   Durvalumab injection What is this medication? DURVALUMAB (dur VAL ue mab) is a monoclonal antibody. It is used to treat lung cancer. This medicine may be used for other purposes; ask your health care provider or pharmacist if you have questions. COMMON BRAND NAME(S): IMFINZI What should I tell my care team before I take  this medication? They need to know if you have any of these conditions: autoimmune diseases like Crohn's disease, ulcerative colitis, or lupus have had or planning to have an allogeneic stem cell transplant (uses someone else's stem cells) history of organ transplant history of radiation to the  chest nervous system problems like myasthenia gravis or Guillain-Barre syndrome an unusual or allergic reaction to durvalumab, other medicines, foods, dyes, or preservatives pregnant or trying to get pregnant breast-feeding How should I use this medication? This medicine is for infusion into a vein. It is given by a health care professional in a hospital or clinic setting. A special MedGuide will be given to you before each treatment. Be sure to read this information carefully each time. Talk to your pediatrician regarding the use of this medicine in children. Special care may be needed. Overdosage: If you think you have taken too much of this medicine contact a poison control center or emergency room at once. NOTE: This medicine is only for you. Do not share this medicine with others. What if I miss a dose? It is important not to miss your dose. Call your doctor or health care professional if you are unable to keep an appointment. What may interact with this medication? Interactions have not been studied. This list may not describe all possible interactions. Give your health care provider a list of all the medicines, herbs, non-prescription drugs, or dietary supplements you use. Also tell them if you smoke, drink alcohol, or use illegal drugs. Some items may interact with your medicine. What should I watch for while using this medication? This medication may make you feel generally unwell. Continue your course of treatment even though you feel ill unless your care team tells you to stop. You may need blood work done while you are taking this medication. Do not become pregnant while taking this medication or for 3 months after stopping it. Women should inform their care team if they wish to become pregnant or think they might be pregnant. There is a potential for serious side effects to an unborn child. Talk to your care team or pharmacist for more information. Do not breast-feed an infant while  taking this medication or for 3 months after stopping it. What side effects may I notice from receiving this medication? Side effects that you should report to your care team as soon as possible: Allergic reactions--skin rash, itching, hives, swelling of the face, lips, tongue, or throat Bloody or watery diarrhea Dizziness, loss of balance or coordination, confusion or trouble speaking Dry cough, shortness of breath or trouble breathing Flushing, mostly over the face, neck, and chest, during injection High blood sugar (hyperglycemia)--increased thirst or amount of urine, unusual weakness or fatigue, blurry vision High thyroid levels (hyperthyroidism)--fast or irregular heartbeat, weight loss, excessive sweating or sensitivity to heat, tremors or shaking, anxiety, nervousness, irregular menstrual cycle or spotting Infection--fever, chills, cough, or sore throat Liver injury--right upper belly pain, loss of appetite, nausea, light-colored stool, dark yellow or brown urine, yellowing skin or eyes, unusual weakness or fatigue Low adrenal gland function--nausea, vomiting, loss of appetite, unusual weakness or fatigue, dizziness, low blood pressure Low thyroid levels (hypothyroidism)--unusual weakness or fatigue, increased sensitivity to cold, constipation, hair loss, dry skin, weight gain, feelings of depression Pancreatitis--severe stomach pain that spreads to your back or gets worse after eating or when touched, fever, nausea, vomiting Rash, fever, and swollen lymph nodes Redness, blistering, peeling or loosening of the skin, including inside the mouth Wheezing--trouble breathing with  loud or whistling sounds Side effects that usually do not require medical attention (report these to your care team if they continue or are bothersome): Fatigue Hair loss This list may not describe all possible side effects. Call your doctor for medical advice about side effects. You may report side effects to FDA at  1-800-FDA-1088. Where should I keep my medication? This medication is given in a hospital or clinic. It will not be stored at home. NOTE: This sheet is a summary. It may not cover all possible information. If you have questions about this medicine, talk to your doctor, pharmacist, or health care provider.  2022 Elsevier/Gold Standard (2021-06-09 00:00:00)

## 2021-09-17 NOTE — Progress Notes (Signed)
[facility] NOTE  Patient Care Team: Jason Hire, MD as PCP - General (Internal Medicine) Jason Nab, RN as Oncology Nurse Navigator Jason Sickle, MD as Consulting Physician (Hematology and Oncology)  CHIEF COMPLAINTS/PURPOSE OF CONSULTATION: Lung cancer  #  Oncology History Overview Note  # NOV 2021- LEFT LOWER LOBE NON-SMALL CELL CA [favor adeno ca]; T2N3 [right hilar; subcarinal LN;Jason Warren; NOV 2021-MRI [place]  # 11/30- carbo-Taxol-RT [RT until 10/23/20]  # Right lower lobe - ~4.6 x 3.1 cm  [PET 07-2020]- demonstrates no significant hypermetabolic activity (SUV max 1.8]-benign. STABLE.   # A.fib [Eliquis; JasonKowalski]  # # SURVIVORSHIP:   # GENETICS:   #NGS-ordered  DIAGNOSIS: Lung cancer  STAGE:   III      ;  GOALS:  cure  CURRENT/MOST RECENT THERAPY : carbo-Taxol-RT ]   Cancer of lower lobe of left lung ([facility])  08/14/2020 Initial Diagnosis   Cancer of lower lobe of left lung ([place])   09/02/2020 - 10/22/2020 Chemotherapy    Patient is on Treatment Plan: LUNG DURVALUMAB Q14D      10/16/2020 Cancer Staging   Staging form: Lung, AJCC 8th Edition - Clinical: Stage IIIB (cT2a, cN3, cM0) - Signed by Jason Sickle, MD on 10/16/2020   12/17/2020 -  Chemotherapy   Patient is on Treatment Plan : LUNG Durvalumab q14d      HISTORY OF PRESENTING ILLNESS: Jason Warren is a 87 year old male with past medical history significant for osteoarthritis and is being treated with maintenance nivolumab under the care of Jason Warren.  He reports doing quite well.  Denies any new concerning symptoms at this time.  States he continues to wear his compression stockings per Jason Warren recommendation with complete resolution of his swelling.  Review of Systems  Constitutional: Negative.  Negative for chills, fever, malaise/fatigue and weight loss.  HENT:  Negative for congestion, ear pain and tinnitus.   Eyes: Negative.  Negative  for blurred vision and double vision.  Respiratory: Negative.  Negative for cough, sputum production and shortness of breath.   Cardiovascular: Negative.  Negative for chest pain, palpitations and leg swelling.  Gastrointestinal: Negative.  Negative for abdominal pain, constipation, diarrhea, nausea and vomiting.  Genitourinary:  Negative for dysuria, frequency and urgency.  Musculoskeletal:  Negative for back pain and falls.  Skin: Negative.  Negative for rash.  Neurological: Negative.  Negative for weakness and headaches.  Endo/Heme/Allergies: Negative.  Does not bruise/bleed easily.  Psychiatric/Behavioral: Negative.  Negative for depression. The patient is not nervous/anxious and does not have insomnia.     MEDICAL HISTORY:  Past Medical History:  Diagnosis Date   Arthritis    Dysrhythmia    A-fib   Hip pain    left   HOH (hard of hearing)    LBBB (left bundle branch block) 08/05/2020   Lung cancer ([place])    Skin cancer, basal cell     face top of head    SURGICAL HISTORY: Past Surgical History:  Procedure Laterality Date   CATARACT EXTRACTION W/PHACO Right 07/13/2017   Procedure: CATARACT EXTRACTION PHACO AND INTRAOCULAR LENS PLACEMENT ([facility]);  Surgeon: Leandrew Koyanagi, MD;  Location: [facility];  Service: Ophthalmology;  Laterality: Right;  IVA TOPICAL RIGHT   CATARACT EXTRACTION W/PHACO Left 01/04/2018   Procedure: CATARACT EXTRACTION PHACO AND INTRAOCULAR LENS PLACEMENT ([place]) LEFT;  Surgeon: Leandrew Koyanagi, MD;  Location: [facility];  Service: Ophthalmology;  Laterality: Left;   COLONOSCOPY     IR  IMAGING GUIDED PORT INSERTION  08/27/2020   JOINT REPLACEMENT     Left total hip Dr. Su Hoff 08-04-18   ROTATOR CUFF REPAIR Right    SKIN CANCER EXCISION     face   TOTAL HIP ARTHROPLASTY Left 08/04/2018   Procedure: TOTAL HIP ARTHROPLASTY ANTERIOR APPROACH;  Surgeon: Frederik Pear, MD;  Location: [facility];  Service: Orthopedics;  Laterality: Left;    VIDEO BRONCHOSCOPY WITH ENDOBRONCHIAL NAVIGATION N/A 08/07/2020   Procedure: VIDEO BRONCHOSCOPY WITH ENDOBRONCHIAL NAVIGATION;  Surgeon: Ottie Glazier, MD;  Location: [facility];  Service: Thoracic;  Laterality: N/A;   VIDEO BRONCHOSCOPY WITH ENDOBRONCHIAL ULTRASOUND N/A 08/07/2020   Procedure: VIDEO BRONCHOSCOPY WITH ENDOBRONCHIAL ULTRASOUND;  Surgeon: Ottie Glazier, MD;  Location: [facility];  Service: Thoracic;  Laterality: N/A;    SOCIAL HISTORY: Social History   Socioeconomic History   Marital status: Married    Spouse name: Not on file   Number of children: Not on file   Years of education: Not on file   Highest education level: Not on file  Occupational History   Not on file  Tobacco Use   Smoking status: Former    Packs/day: 1.00    Years: 4.00    Pack years: 4.00    Types: Cigarettes   Smokeless tobacco: Never   Tobacco comments:    quit early 70's  Vaping Use   Vaping Use: Never used  Substance and Sexual Activity   Alcohol use: No   Drug use: No   Sexual activity: Not Currently  Other Topics Concern   Not on file  Social History Narrative   > quit 35 years; smoked for 15 years. Rare alcohol. 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    Social Determinants of Health   Financial Resource Strain: Not on file  Food Insecurity: Not on file  Transportation Needs: Not on file  Physical Activity: Not on file  Stress: Not on file  Social Connections: Not on file  Intimate Partner Violence: Not on file    FAMILY HISTORY: Family History  Problem Relation Age of Onset   Throat cancer Brother         & lung cancer    ALLERGIES:  has No Known Allergies.  MEDICATIONS:  Current Outpatient Medications  Medication Sig Dispense Refill   acetaminophen (TYLENOL) 500 MG tablet Take 500 mg by mouth every 8 (eight) hours as needed for moderate pain.     apixaban (ELIQUIS) 5 MG TABS tablet Take 5 mg by mouth  2 (two) times daily.     hydroxypropyl methylcellulose / hypromellose (ISOPTO TEARS / GONIOVISC) 2.5 % ophthalmic solution Place 1 drop into the left eye daily as needed for dry eyes.     lidocaine-prilocaine (EMLA) cream Apply 1 application topically as needed. Apply small amount to port site at least 1 hour prior to it being accessed, cover with plastic wrap 30 g 1   Multiple Vitamin (MULTIVITAMIN) tablet Take 1 tablet by mouth daily.     Polyethyl Glycol-Propyl Glycol (SYSTANE FREE OP) Apply 1 drop to eye as needed.     pravastatin (PRAVACHOL) 80 MG tablet Take 80 mg by mouth at bedtime.      saw palmetto 160 MG capsule Take 160 mg by mouth 2 (two) times daily.     No current facility-administered medications for this visit.   Facility-Administered Medications Ordered in Other Visits  Medication Dose Route Frequency  Provider Last Rate Last Admin   heparin lock flush 100 UNIT/ML injection            sodium chloride flush (NS) 0.9 % injection 10 mL  10 mL Intravenous PRN Jason Sickle, MD   10 mL at 02/11/21 0835   PHYSICAL EXAMINATION: ECOG PERFORMANCE STATUS: 1 - Symptomatic but completely ambulatory  Vitals:   09/17/21 0858  BP: 129/68  Pulse: 73  Resp: 16  Temp: (!) 96.7 F (35.9 C)   Filed Weights   09/17/21 0858  Weight: 160 lb 12.8 oz (72.9 kg)    Physical Exam Constitutional:      Appearance: Normal appearance.  HENT:     Head: Normocephalic and atraumatic.  Eyes:     Pupils: Pupils are equal, round, and reactive to light.  Cardiovascular:     Rate and Rhythm: Normal rate and regular rhythm.     Heart sounds: Normal heart sounds. No murmur heard. Pulmonary:     Effort: Pulmonary effort is normal.     Breath sounds: Normal breath sounds. No wheezing.  Abdominal:     General: Bowel sounds are normal. There is no distension.     Palpations: Abdomen is soft.     Tenderness: There is no abdominal tenderness.  Musculoskeletal:        General: Normal range  of motion.     Cervical back: Normal range of motion.  Skin:    General: Skin is warm and dry.     Findings: No rash.  Neurological:     Mental Status: He is alert and oriented to person, place, and time.     Gait: Gait is intact.  Psychiatric:        Mood and Affect: Mood and affect normal.        Cognition and Memory: Memory normal.        Judgment: Judgment normal.     LABORATORY DATA:  I have reviewed the data as listed Lab Results  Component Value Date   WBC 7.3 09/17/2021   HGB 13.7 09/17/2021   HCT 41.2 09/17/2021   MCV 96.7 09/17/2021   PLT 305 09/17/2021   Recent Labs    07/30/21 0835 08/13/21 0858 09/03/21 0835  NA 137 138 137  K 4.1 4.1 4.1  CL 103 102 102  CO2 25 28 25   GLUCOSE 129* 123* 125*  BUN 26* 18 21  CREATININE 0.87 0.94 1.09  CALCIUM 9.1 8.9 8.7*  GFRNONAA >60 >60 >60  PROT 7.3 7.5 7.4  ALBUMIN 4.3 4.0 4.0  AST 30 27 26   ALT 27 22 20   ALKPHOS 107 133* 142*  BILITOT 0.9 0.9 0.8     RADIOGRAPHIC STUDIES: I have personally reviewed the radiological images as listed and agreed with the findings in the report. No results found.   ASSESSMENT & PLAN: Mr. Teagle is a 87 year old male who was seen in clinic today prior to next cycle of adjuvant nivolumab.  Clinically he appears to be doing well.  Labs are adequate for treatment.  He recently had a CT scan which showed no evidence of recurrent disease but a new 3 mm nodule in right middle lobe.  Continues to have a mildly elevated alkaline phosphatase but this is stable.  Left-sided pleural effusion- Presented on CT scan back in November.  No thoracentesis at this time.  Bilateral lower extremity swelling- Improvement noted with compression stockings.  Disposition- Proceed with Imfinzi today. Return to clinic as scheduled in 2  weeks to see Jason Warren back with lab work and [employer].  I spent 20 minutes dedicated to the care of this patient (face-to-face and non-face-to-face) on the  date of the encounter to include what is described in the assessment and plan.  No problem-specific Assessment & Plan notes found for this encounter.   All questions were answered. The patient knows to call the clinic with any problems, questions or concerns.   Jacquelin Hawking, NP 09/17/2021 9:09 AM

## 2021-09-17 NOTE — Progress Notes (Signed)
Patient denies new problems/concerns today.   °

## 2021-10-01 ENCOUNTER — Inpatient Hospital Stay: Payer: Medicare Other

## 2021-10-01 ENCOUNTER — Other Ambulatory Visit: Payer: Self-pay

## 2021-10-01 ENCOUNTER — Inpatient Hospital Stay (HOSPITAL_BASED_OUTPATIENT_CLINIC_OR_DEPARTMENT_OTHER): Payer: Medicare Other | Admitting: Internal Medicine

## 2021-10-01 ENCOUNTER — Encounter: Payer: Self-pay | Admitting: Internal Medicine

## 2021-10-01 DIAGNOSIS — C3432 Malignant neoplasm of lower lobe, left bronchus or lung: Secondary | ICD-10-CM

## 2021-10-01 DIAGNOSIS — Z5112 Encounter for antineoplastic immunotherapy: Secondary | ICD-10-CM | POA: Diagnosis not present

## 2021-10-01 LAB — COMPREHENSIVE METABOLIC PANEL
ALT: 18 U/L (ref 0–44)
AST: 31 U/L (ref 15–41)
Albumin: 4 g/dL (ref 3.5–5.0)
Alkaline Phosphatase: 123 U/L (ref 38–126)
Anion gap: 10 (ref 5–15)
BUN: 19 mg/dL (ref 8–23)
CO2: 25 mmol/L (ref 22–32)
Calcium: 8.8 mg/dL — ABNORMAL LOW (ref 8.9–10.3)
Chloride: 101 mmol/L (ref 98–111)
Creatinine, Ser: 0.88 mg/dL (ref 0.61–1.24)
GFR, Estimated: >60 mL/min (ref >60)
Glucose, Bld: 146 mg/dL — ABNORMAL HIGH (ref 70–99)
Potassium: 4 mmol/L (ref 3.5–5.1)
Sodium: 136 mmol/L (ref 135–145)
Total Bilirubin: 0.8 mg/dL (ref 0.3–1.2)
Total Protein: 7.1 g/dL (ref 6.5–8.1)

## 2021-10-01 LAB — CBC WITH DIFFERENTIAL/PLATELET
Abs Immature Granulocytes: 0.02 10*3/uL (ref 0.00–0.07)
Basophils Absolute: 0 10*3/uL (ref 0.0–0.1)
Basophils Relative: 0 %
Eosinophils Absolute: 0.1 10*3/uL (ref 0.0–0.5)
Eosinophils Relative: 2 %
HCT: 42.3 % (ref 39.0–52.0)
Hemoglobin: 13.9 g/dL (ref 13.0–17.0)
Immature Granulocytes: 0 %
Lymphocytes Relative: 17 %
Lymphs Abs: 1 10*3/uL (ref 0.7–4.0)
MCH: 31.8 pg (ref 26.0–34.0)
MCHC: 32.9 g/dL (ref 30.0–36.0)
MCV: 96.8 fL (ref 80.0–100.0)
Monocytes Absolute: 0.6 10*3/uL (ref 0.1–1.0)
Monocytes Relative: 10 %
Neutro Abs: 4.1 10*3/uL (ref 1.7–7.7)
Neutrophils Relative %: 71 %
Platelets: 220 10*3/uL (ref 150–400)
RBC: 4.37 MIL/uL (ref 4.22–5.81)
RDW: 13.9 % (ref 11.5–15.5)
WBC: 5.8 10*3/uL (ref 4.0–10.5)
nRBC: 0 % (ref 0.0–0.2)

## 2021-10-01 MED ORDER — HEPARIN SOD (PORK) LOCK FLUSH 100 UNIT/ML IV SOLN
500.0000 [IU] | Freq: Once | INTRAVENOUS | Status: AC
  Administered 2021-10-01: 12:00:00 500 [IU] via INTRAVENOUS
  Filled 2021-10-01: qty 5

## 2021-10-01 MED ORDER — HEPARIN SOD (PORK) LOCK FLUSH 100 UNIT/ML IV SOLN
500.0000 [IU] | Freq: Once | INTRAVENOUS | Status: AC | PRN
  Administered 2021-10-01: 12:00:00 500 [IU]
  Filled 2021-10-01: qty 5

## 2021-10-01 MED ORDER — SODIUM CHLORIDE 0.9% FLUSH
10.0000 mL | Freq: Once | INTRAVENOUS | Status: AC
  Administered 2021-10-01: 09:00:00 10 mL via INTRAVENOUS
  Filled 2021-10-01: qty 10

## 2021-10-01 MED ORDER — SODIUM CHLORIDE 0.9 % IV SOLN
Freq: Once | INTRAVENOUS | Status: AC
  Filled 2021-10-01: qty 250

## 2021-10-01 MED ORDER — SODIUM CHLORIDE 0.9 % IV SOLN
10.0000 mg/kg | Freq: Once | INTRAVENOUS | Status: AC
  Administered 2021-10-01: 11:00:00 740 mg via INTRAVENOUS
  Filled 2021-10-01: qty 10

## 2021-10-01 NOTE — Progress Notes (Signed)
[facility] NOTE  Patient Care Team: Baxter Hire, MD as PCP - General (Internal Medicine) [name], RN as Oncology Nurse Navigator [name], MD as Consulting Physician (Hematology and Oncology)  CHIEF COMPLAINTS/PURPOSE OF CONSULTATION: Lung cancer  #  Oncology History Overview Note  # NOV 2021- LEFT LOWER LOBE NON-SMALL CELL CA [favor adeno ca]; T2N3 [right hilar; subcarinal LN;[name]; NOV 2021-MRI [place]  # 11/30- carbo-Taxol-RT [RT until 10/23/20]  # Right lower lobe - ~4.6 x 3.1 cm  [PET 07-2020]- demonstrates no significant hypermetabolic activity (SUV max 1.8]-benign. STABLE.   # A.fib [Eliquis; Dr.Kowalski]  # # SURVIVORSHIP:   # GENETICS:   #NGS-ordered  DIAGNOSIS: Lung cancer  STAGE:   III      ;  GOALS:  cure  CURRENT/MOST RECENT THERAPY : carbo-Taxol-RT ]   Cancer of lower lobe of left lung ([place])  08/14/2020 Initial Diagnosis   Cancer of lower lobe of left lung ([place])   09/02/2020 - 10/22/2020 Chemotherapy    Patient is on Treatment Plan: LUNG DURVALUMAB Q14D       10/16/2020 Cancer Staging   Staging form: Lung, AJCC 8th Edition - Clinical: Stage IIIB (cT2a, cN3, cM0) - Signed by [name], MD on 10/16/2020    12/17/2020 -  Chemotherapy   Patient is on Treatment Plan : LUNG Durvalumab q14d      HISTORY OF PRESENTING ILLNESS: Walking independently but with a limp because of left hip arthritis.  He is alone.  Jason Warren 87 y.o.  male patient with stage III lung non-small cell lung-currently adjuvant duvalumab is here for follow-up.  Patient denies any new symptoms or shortness of vomiting or diarrhea.  No swelling in the legs continues.  He continues to wear compression stockings.  Review of Systems  Constitutional:  Negative for chills, diaphoresis, fever, malaise/fatigue and weight loss.  HENT:  Negative for nosebleeds and sore throat.   Eyes:  Negative for double vision.   Respiratory:  Negative for hemoptysis, sputum production, shortness of breath and wheezing.   Cardiovascular:  Negative for chest pain, palpitations, orthopnea and leg swelling.  Gastrointestinal:  Negative for abdominal pain, blood in stool, constipation, diarrhea, heartburn, melena, nausea and vomiting.  Genitourinary:  Negative for dysuria, frequency and urgency.  Musculoskeletal:  Negative for back pain and joint pain.  Skin:  Negative for itching.  Neurological:  Negative for tingling, focal weakness, weakness and headaches.  Endo/Heme/Allergies:  Does not bruise/bleed easily.  Psychiatric/Behavioral:  Negative for depression. The patient is not nervous/anxious and does not have insomnia.     MEDICAL HISTORY:  Past Medical History:  Diagnosis Date   Arthritis    Dysrhythmia    A-fib   Hip pain    left   HOH (hard of hearing)    LBBB (left bundle branch block) 08/05/2020   Lung cancer ([place])    Skin cancer, basal cell     face top of head    SURGICAL HISTORY: Past Surgical History:  Procedure Laterality Date   CATARACT EXTRACTION W/PHACO Right 07/13/2017   Procedure: CATARACT EXTRACTION PHACO AND INTRAOCULAR LENS PLACEMENT ([place]);  Surgeon: Leandrew Koyanagi, MD;  Location: [facility];  Service: Ophthalmology;  Laterality: Right;  IVA TOPICAL RIGHT   CATARACT EXTRACTION W/PHACO Left 01/04/2018   Procedure: CATARACT EXTRACTION PHACO AND INTRAOCULAR LENS PLACEMENT ([place]) LEFT;  Surgeon: Leandrew Koyanagi, MD;  Location: [facility];  Service: Ophthalmology;  Laterality: Left;   COLONOSCOPY  IR IMAGING GUIDED PORT INSERTION  08/27/2020   JOINT REPLACEMENT     Left total hip Dr. Su Hoff 08-04-18   ROTATOR CUFF REPAIR Right    SKIN CANCER EXCISION     face   TOTAL HIP ARTHROPLASTY Left 08/04/2018   Procedure: TOTAL HIP ARTHROPLASTY ANTERIOR APPROACH;  Surgeon: Frederik Pear, MD;  Location: [facility];  Service: Orthopedics;  Laterality: Left;   VIDEO  BRONCHOSCOPY WITH ENDOBRONCHIAL NAVIGATION N/A 08/07/2020   Procedure: VIDEO BRONCHOSCOPY WITH ENDOBRONCHIAL NAVIGATION;  Surgeon: Ottie Glazier, MD;  Location: [facility];  Service: Thoracic;  Laterality: N/A;   VIDEO BRONCHOSCOPY WITH ENDOBRONCHIAL ULTRASOUND N/A 08/07/2020   Procedure: VIDEO BRONCHOSCOPY WITH ENDOBRONCHIAL ULTRASOUND;  Surgeon: Ottie Glazier, MD;  Location: [facility];  Service: Thoracic;  Laterality: N/A;    SOCIAL HISTORY: Social History   Socioeconomic History   Marital status: Married    Spouse name: Not on file   Number of children: Not on file   Years of education: Not on file   Highest education level: Not on file  Occupational History   Not on file  Tobacco Use   Smoking status: Former    Packs/day: 1.00    Years: 4.00    Pack years: 4.00    Types: Cigarettes   Smokeless tobacco: Never   Tobacco comments:    quit early 70's  Vaping Use   Vaping Use: Never used  Substance and Sexual Activity   Alcohol use: No   Drug use: No   Sexual activity: Not Currently  Other Topics Concern   Not on file  Social History Narrative   > quit 35 years; smoked for 15 years. Rare alcohol. 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    Social Determinants of Health   Financial Resource Strain: Not on file  Food Insecurity: Not on file  Transportation Needs: Not on file  Physical Activity: Not on file  Stress: Not on file  Social Connections: Not on file  Intimate Partner Violence: Not on file    FAMILY HISTORY: Family History  Problem Relation Age of Onset   Throat cancer Brother         & lung cancer    ALLERGIES:  has No Known Allergies.  MEDICATIONS:  Current Outpatient Medications  Medication Sig Dispense Refill   acetaminophen (TYLENOL) 500 MG tablet Take 500 mg by mouth every 8 (eight) hours as needed for moderate pain.     apixaban (ELIQUIS) 5 MG TABS tablet Take 5 mg by mouth 2 (two)  times daily.     hydroxypropyl methylcellulose / hypromellose (ISOPTO TEARS / GONIOVISC) 2.5 % ophthalmic solution Place 1 drop into the left eye daily as needed for dry eyes.     lidocaine-prilocaine (EMLA) cream Apply 1 application topically as needed. Apply small amount to port site at least 1 hour prior to it being accessed, cover with plastic wrap 30 g 1   Multiple Vitamin (MULTIVITAMIN) tablet Take 1 tablet by mouth daily.     Polyethyl Glycol-Propyl Glycol (SYSTANE FREE OP) Apply 1 drop to eye as needed.     pravastatin (PRAVACHOL) 80 MG tablet Take 80 mg by mouth at bedtime.      saw palmetto 160 MG capsule Take 160 mg by mouth 2 (two) times daily.     No current facility-administered medications for this visit.   Facility-Administered Medications Ordered in Other Visits  Medication Dose Route  Frequency Provider Last Rate Last Admin   durvalumab (IMFINZI) 740 mg in sodium chloride 0.9 % 100 mL chemo infusion  10 mg/kg (Order-Specific) Intravenous Once Charlaine Dalton R, MD       heparin lock flush 100 UNIT/ML injection            heparin lock flush 100 unit/mL  500 Units Intravenous Once Charlaine Dalton R, MD       heparin lock flush 100 unit/mL  500 Units Intracatheter Once PRN [name], MD       sodium chloride flush (NS) 0.9 % injection 10 mL  10 mL Intravenous PRN [name], MD   10 mL at 02/11/21 0835   PHYSICAL EXAMINATION: ECOG PERFORMANCE STATUS: 1 - Symptomatic but completely ambulatory  Vitals:   10/01/21 0928  BP: (!) 142/67  Pulse: (!) 54  Resp: 16  Temp: (!) 97.3 F (36.3 C)   Filed Weights   10/01/21 0928  Weight: 163 lb 6.4 oz (74.1 kg)    Physical Exam HENT:     Head: Normocephalic and atraumatic.     Mouth/Throat:     Pharynx: No oropharyngeal exudate.  Eyes:     Pupils: Pupils are equal, round, and reactive to light.  Cardiovascular:     Rate and Rhythm: Normal rate and regular rhythm.  Pulmonary:     Effort:  Pulmonary effort is normal. No respiratory distress.     Breath sounds: No wheezing.     Comments: Slightly decreased breath sounds left lower lung base. Abdominal:     General: Bowel sounds are normal. There is no distension.     Palpations: Abdomen is soft. There is no mass.     Tenderness: There is no abdominal tenderness. There is no guarding or rebound.  Musculoskeletal:        General: No tenderness. Normal range of motion.     Cervical back: Normal range of motion and neck supple.  Skin:    General: Skin is warm.  Neurological:     Mental Status: He is alert and oriented to person, place, and time.  Psychiatric:        Mood and Affect: Affect normal.     LABORATORY DATA:  I have reviewed the data as listed Lab Results  Component Value Date   WBC 5.8 10/01/2021   HGB 13.9 10/01/2021   HCT 42.3 10/01/2021   MCV 96.8 10/01/2021   PLT 220 10/01/2021   Recent Labs    09/03/21 0835 09/17/21 0849 10/01/21 0850  NA 137 133* 136  K 4.1 4.3 4.0  CL 102 99 101  CO2 25 24 25   GLUCOSE 125* 143* 146*  BUN 21 22 19   CREATININE 1.09 0.98 0.88  CALCIUM 8.7* 8.7* 8.8*  GFRNONAA >60 >60 >60  PROT 7.4 7.3 7.1  ALBUMIN 4.0 3.8 4.0  AST 26 32 31  ALT 20 23 18   ALKPHOS 142* 143* 123  BILITOT 0.8 0.9 0.8    RADIOGRAPHIC STUDIES: I have personally reviewed the radiological images as listed and agreed with the findings in the report. No results found.   ASSESSMENT & PLAN:   Cancer of lower lobe of left lung ([place]) # T2N3- Stage III-non-small cell lung cancer-[favor adenocarcinoma]- s/p  concurrent chemoradiation; Finished Feb 2022]; currently on adjuvant durvalumab. CT scan- NOV 7th, 2022-continued left lower lobe architectural distortion/fibrosis without any evidence of recurrent disease.  Small stable left-sided pleural effusion [see below]; right lower lobe peribronchial architectural distortion [chronic  unrelated to malignancy/treatment]. STABLE.   # proceed with IMFINZI  [until the end of February 2023] Labs today reviewed;  acceptable for treatment today. DEC 2022-TSH-WNL.  Discussed with posttreatment recommend imaging every 3 to 4 months surveillance.  #CT scan-August 10, 2021- medial right middle lobe 3 mm nodule is new since the prior exam-monitor for now. Stable.  # Mild evekation of Alk phos-? From Imfinzi- monitor for now.   # left-sided pleural effusion-CT November 2022. Hold off any thoracentesis- Stable..  # A. fib on Eliquis [Dr.Kowalski]-  Mild swelling in legs- EF in Feb 2022- 55-60%; continue BIL compression stocking/leg elevation. Stable..  # DISPOSITION: # proceed with  IMFINZI today; # follow up in 2 weeks-MD labs- cbc/cmp;;IMFINZI;Dr.B    All questions were answered. The patient knows to call the clinic with any problems, questions or concerns.   [name], MD 10/01/2021 10:23 AM

## 2021-10-01 NOTE — Progress Notes (Signed)
Patient denies new problems/concerns today.   °

## 2021-10-01 NOTE — Assessment & Plan Note (Addendum)
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 CT scan- NOV 7th, 2022-continued left lower lobe architectural distortion/fibrosis without any evidence of recurrent disease.  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 STABLE.   # proceed with IMFINZI [until the end of February 2023] Labs today reviewed;  acceptable for treatment today. DEC 2022-TSH-WNL.  Discussed with posttreatment recommend imaging every 3 to 4 months surveillance.  #CT scan-August 10, 2021- medial right middle lobe 3 mm nodule is new since the prior exam-monitor for now. Stable.  # Mild evekation of Alk phos-? From Imfinzi- monitor for now.   # left-sided pleural effusion-CT November 2022. Hold off any thoracentesis- Stable..  # A. fib on Eliquis [Dr.Kowalski]-  Mild swelling in legs- EF in Feb 2022- 55-60%; continue BIL compression stocking/leg elevation. Stable..  # DISPOSITION: # proceed with  IMFINZI today; # follow up in 2 weeks-MD labs- cbc/cmp;;IMFINZI;Dr.B

## 2021-10-01 NOTE — Patient Instructions (Signed)
[facility]  Discharge Instructions: ?Thank you for choosing [facility] to provide your oncology and hematology care.  ?If you have a lab appointment with the Cancer Center, please go directly to the Cancer Center and check in at the registration area. ? ?Wear comfortable clothing and clothing appropriate for easy access to any Portacath or PICC line.  ? ?We strive to give you quality time with your provider. You may need to reschedule your appointment if you arrive late (15 or more minutes).  Arriving late affects you and other patients whose appointments are after yours.  Also, if you miss three or more appointments without notifying the office, you may be dismissed from the clinic at the provider?s discretion.    ?  ?For prescription refill requests, have your pharmacy contact our office and allow 72 hours for refills to be completed.   ? ?  ?To help prevent nausea and vomiting after your treatment, we encourage you to take your nausea medication as directed. ? ?BELOW ARE SYMPTOMS THAT SHOULD BE REPORTED IMMEDIATELY: ?*FEVER GREATER THAN 100.4 F (38 ?C) OR HIGHER ?*CHILLS OR SWEATING ?*NAUSEA AND VOMITING THAT IS NOT CONTROLLED WITH YOUR NAUSEA MEDICATION ?*UNUSUAL SHORTNESS OF BREATH ?*UNUSUAL BRUISING OR BLEEDING ?*URINARY PROBLEMS (pain or burning when urinating, or frequent urination) ?*BOWEL PROBLEMS (unusual diarrhea, constipation, pain near the anus) ?TENDERNESS IN MOUTH AND THROAT WITH OR WITHOUT PRESENCE OF ULCERS (sore throat, sores in mouth, or a toothache) ?UNUSUAL RASH, SWELLING OR PAIN  ?UNUSUAL VAGINAL DISCHARGE OR ITCHING  ? ?Items with * indicate a potential emergency and should be followed up as soon as possible or go to the Emergency Department if any problems should occur. ? ?Please show the CHEMOTHERAPY ALERT CARD or IMMUNOTHERAPY ALERT CARD at check-in to the Emergency Department and triage nurse. ? ?Should you have questions after your visit  or need to cancel or reschedule your appointment, please contact [facility]  [phone] and follow the prompts.  Office hours are 8:00 a.m. to 4:30 p.m. Monday - Friday. Please note that voicemails left after 4:00 p.m. may not be returned until the following business day.  We are closed weekends and major holidays. You have access to a nurse at all times for urgent questions. Please call the main number to the clinic [phone] and follow the prompts. ? ?For any non-urgent questions, you may also contact your provider using MyChart. We now offer e-Visits for anyone 18 and older to request care online for non-urgent symptoms. For details visit [email or web address]. ?  ?Also download the MyChart app! Go to the app store, search "MyChart", open the app, select [facility], and log in with your MyChart username and password. ? ?Due to Covid, a mask is required upon entering the hospital/clinic. If you do not have a mask, one will be given to you upon arrival. For doctor visits, patients may have 1 support person aged 18 or older with them. For treatment visits, patients cannot have anyone with them due to current Covid guidelines and our immunocompromised population.  ?

## 2021-10-12 ENCOUNTER — Ambulatory Visit
Admission: RE | Admit: 2021-10-12 | Discharge: 2021-10-12 | Disposition: A | Discharge status: Home / Self Care | Payer: Medicare Other | Source: Ambulatory Visit | Attending: Radiation Oncology | Admitting: Radiation Oncology

## 2021-10-12 ENCOUNTER — Other Ambulatory Visit: Payer: Self-pay

## 2021-10-12 ENCOUNTER — Encounter: Payer: Self-pay | Admitting: Radiation Oncology

## 2021-10-12 VITALS — BP 121/68 | HR 60 | Temp 97.3°F | Resp 16 | Wt 161.2 lb

## 2021-10-12 DIAGNOSIS — C3432 Malignant neoplasm of lower lobe, left bronchus or lung: Secondary | ICD-10-CM | POA: Insufficient documentation

## 2021-10-12 DIAGNOSIS — Z9221 Personal history of antineoplastic chemotherapy: Secondary | ICD-10-CM | POA: Diagnosis not present

## 2021-10-12 DIAGNOSIS — R911 Solitary pulmonary nodule: Secondary | ICD-10-CM | POA: Diagnosis not present

## 2021-10-12 DIAGNOSIS — R1310 Dysphagia, unspecified: Secondary | ICD-10-CM | POA: Diagnosis not present

## 2021-10-12 DIAGNOSIS — Z923 Personal history of irradiation: Secondary | ICD-10-CM | POA: Insufficient documentation

## 2021-10-12 NOTE — Progress Notes (Signed)
Radiation Oncology Follow up Note  Name: Jason Warren   Date:   10/12/2021 MRN:  [id number] DOB: 1929-08-04    This 88 y.o. 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  REFERRING PROVIDER: Baxter Hire, MD  HPI: Patient is a 88 year old male now out 11 months having completed concurrent chemoradiation therapy for stage IIIb adenocarcinoma left lung.  Seen today in routine follow-up he is doing well.  P.o. intake is good.  Does have occasional cough and states he has sometimes has some dysphagia from his secretions..  He had a CT scan back in November showing stable chest with left perihilar radiation-induced fibrosis and architectural distortion.  He has a small small 3 mm nodule which is new in the right middle lobe which will be followed.  He specifically denies hemoptysis or chest tightness.  COMPLICATIONS OF TREATMENT: none  FOLLOW UP COMPLIANCE: keeps appointments   PHYSICAL EXAM:  BP 121/68    Pulse 60    Temp (!) 97.3 F (36.3 C) (Tympanic)    Resp 16    Wt 161 lb 3.2 oz (73.1 kg)    BMI 21.86 kg/m  Well-developed well-nourished patient in NAD. HEENT reveals PERLA, EOMI, discs not visualized.  Oral cavity is clear. No oral mucosal lesions are identified. Neck is clear without evidence of cervical or supraclavicular adenopathy. Lungs are clear to A&P. Cardiac examination is essentially unremarkable with regular rate and rhythm without murmur rub or thrill. Abdomen is benign with no organomegaly or masses noted. Motor sensory and DTR levels are equal and symmetric in the upper and lower extremities. Cranial nerves II through XII are grossly intact. Proprioception is intact. No peripheral adenopathy or edema is identified. No motor or sensory levels are noted. Crude visual fields are within normal range.  RADIOLOGY RESULTS: CT scan reviewed compatible with above-stated findings  PLAN:  Present time patient is doing well he is stable with no evidence of disease.  CT scans were reviewed compatible with above-stated findings showing stable chest.  I have asked to see him back in 1 year for follow-up.  He is currently on Imfinzi which she is tolerating well.  I have asked to see him back in 6 months for follow-up.  We will keep a tab on the new 3 mm nodule.  Patient also continues close follow-up care with medical oncology and treatment.  Patient knows to call with any concerns.  I would like to take this opportunity to thank you for allowing me to participate in the care of your patient.Noreene Filbert, MD

## 2021-10-15 ENCOUNTER — Inpatient Hospital Stay: Payer: Medicare Other

## 2021-10-15 ENCOUNTER — Inpatient Hospital Stay (HOSPITAL_BASED_OUTPATIENT_CLINIC_OR_DEPARTMENT_OTHER): Payer: Medicare Other | Admitting: Internal Medicine

## 2021-10-15 ENCOUNTER — Other Ambulatory Visit: Payer: Self-pay

## 2021-10-15 ENCOUNTER — Encounter: Payer: Self-pay | Admitting: Internal Medicine

## 2021-10-15 ENCOUNTER — Inpatient Hospital Stay: Payer: Medicare Other | Attending: Internal Medicine

## 2021-10-15 DIAGNOSIS — I4891 Unspecified atrial fibrillation: Secondary | ICD-10-CM | POA: Diagnosis not present

## 2021-10-15 DIAGNOSIS — Z79899 Other long term (current) drug therapy: Secondary | ICD-10-CM | POA: Diagnosis not present

## 2021-10-15 DIAGNOSIS — Z5112 Encounter for antineoplastic immunotherapy: Secondary | ICD-10-CM | POA: Diagnosis not present

## 2021-10-15 DIAGNOSIS — Z7901 Long term (current) use of anticoagulants: Secondary | ICD-10-CM | POA: Insufficient documentation

## 2021-10-15 DIAGNOSIS — C3432 Malignant neoplasm of lower lobe, left bronchus or lung: Secondary | ICD-10-CM | POA: Diagnosis present

## 2021-10-15 DIAGNOSIS — M1612 Unilateral primary osteoarthritis, left hip: Secondary | ICD-10-CM | POA: Diagnosis not present

## 2021-10-15 DIAGNOSIS — J90 Pleural effusion, not elsewhere classified: Secondary | ICD-10-CM | POA: Insufficient documentation

## 2021-10-15 LAB — CBC WITH DIFFERENTIAL/PLATELET
Abs Immature Granulocytes: 0.02 10*3/uL (ref 0.00–0.07)
Basophils Absolute: 0 10*3/uL (ref 0.0–0.1)
Basophils Relative: 1 %
Eosinophils Absolute: 0.1 10*3/uL (ref 0.0–0.5)
Eosinophils Relative: 2 %
HCT: 42.1 % (ref 39.0–52.0)
Hemoglobin: 14 g/dL (ref 13.0–17.0)
Immature Granulocytes: 0 %
Lymphocytes Relative: 20 %
Lymphs Abs: 1.1 10*3/uL (ref 0.7–4.0)
MCH: 32.2 pg (ref 26.0–34.0)
MCHC: 33.3 g/dL (ref 30.0–36.0)
MCV: 96.8 fL (ref 80.0–100.0)
Monocytes Absolute: 0.5 10*3/uL (ref 0.1–1.0)
Monocytes Relative: 9 %
Neutro Abs: 3.7 10*3/uL (ref 1.7–7.7)
Neutrophils Relative %: 68 %
Platelets: 227 10*3/uL (ref 150–400)
RBC: 4.35 MIL/uL (ref 4.22–5.81)
RDW: 13.3 % (ref 11.5–15.5)
WBC: 5.4 10*3/uL (ref 4.0–10.5)
nRBC: 0 % (ref 0.0–0.2)

## 2021-10-15 LAB — COMPREHENSIVE METABOLIC PANEL
ALT: 28 U/L (ref 0–44)
AST: 61 U/L — ABNORMAL HIGH (ref 15–41)
Albumin: 4.1 g/dL (ref 3.5–5.0)
Alkaline Phosphatase: 120 U/L (ref 38–126)
Anion gap: 6 (ref 5–15)
BUN: 19 mg/dL (ref 8–23)
CO2: 26 mmol/L (ref 22–32)
Calcium: 8.8 mg/dL — ABNORMAL LOW (ref 8.9–10.3)
Chloride: 104 mmol/L (ref 98–111)
Creatinine, Ser: 0.78 mg/dL (ref 0.61–1.24)
GFR, Estimated: >60 mL/min (ref >60)
Glucose, Bld: 115 mg/dL — ABNORMAL HIGH (ref 70–99)
Potassium: 4.3 mmol/L (ref 3.5–5.1)
Sodium: 136 mmol/L (ref 135–145)
Total Bilirubin: 1 mg/dL (ref 0.3–1.2)
Total Protein: 7.2 g/dL (ref 6.5–8.1)

## 2021-10-15 MED ORDER — HEPARIN SOD (PORK) LOCK FLUSH 100 UNIT/ML IV SOLN
500.0000 [IU] | Freq: Once | INTRAVENOUS | Status: AC | PRN
  Filled 2021-10-15: qty 5

## 2021-10-15 MED ORDER — SODIUM CHLORIDE 0.9% FLUSH
10.0000 mL | INTRAVENOUS | Status: DC | PRN
  Administered 2021-10-15: 10 mL
  Filled 2021-10-15: qty 10

## 2021-10-15 MED ORDER — SODIUM CHLORIDE 0.9 % IV SOLN
Freq: Once | INTRAVENOUS | Status: AC
  Filled 2021-10-15: qty 250

## 2021-10-15 MED ORDER — SODIUM CHLORIDE 0.9 % IV SOLN
10.0000 mg/kg | Freq: Once | INTRAVENOUS | Status: AC
  Administered 2021-10-15: 740 mg via INTRAVENOUS
  Filled 2021-10-15: qty 10

## 2021-10-15 MED ORDER — HEPARIN SOD (PORK) LOCK FLUSH 100 UNIT/ML IV SOLN
INTRAVENOUS | Status: AC
  Administered 2021-10-15: 500 [IU]
  Filled 2021-10-15: qty 5

## 2021-10-15 NOTE — Progress Notes (Signed)
Pt doing well eating and drinking good. Has good BM. Sleeps well most of time and some times hard to sleep all night. He does have middle finger of right hand that is swollen and does not know made it that way

## 2021-10-15 NOTE — Progress Notes (Signed)
[facility] NOTE  Patient Care Team: Baxter Hire, MD as PCP - General (Internal Medicine) [name], RN as Oncology Nurse Navigator [name], MD as Consulting Physician (Hematology and Oncology)  CHIEF COMPLAINTS/PURPOSE OF CONSULTATION: Lung cancer  #  Oncology History Overview Note  # NOV 2021- LEFT LOWER LOBE NON-SMALL CELL CA [favor adeno ca]; T2N3 [right hilar; subcarinal LN;[name]; NOV 2021-MRI [facility]  # 11/30- carbo-Taxol-RT [RT until 10/23/20]  # Right lower lobe - ~4.6 x 3.1 cm  [PET 07-2020]- demonstrates no significant hypermetabolic activity (SUV max 1.8]-benign. STABLE.   # A.fib [Eliquis; Dr.Kowalski]  # # SURVIVORSHIP:   # GENETICS:   #NGS-ordered  DIAGNOSIS: Lung cancer  STAGE:   III      ;  GOALS:  cure  CURRENT/MOST RECENT THERAPY : carbo-Taxol-RT ]   Cancer of lower lobe of left lung ([place])  08/14/2020 Initial Diagnosis   Cancer of lower lobe of left lung ([place])   09/02/2020 - 10/22/2020 Chemotherapy    Patient is on Treatment Plan: LUNG DURVALUMAB Q14D       10/16/2020 Cancer Staging   Staging form: Lung, AJCC 8th Edition - Clinical: Stage IIIB (cT2a, cN3, cM0) - Signed by [name], MD on 10/16/2020    12/17/2020 -  Chemotherapy   Patient is on Treatment Plan : LUNG Durvalumab q14d      HISTORY OF PRESENTING ILLNESS: Walking independently but with a limp because of left hip arthritis.  He is alone.  Jason Warren 88 y.o.  male patient with stage III lung non-small cell lung-currently adjuvant duvalumab is here for follow-up.  Denies any new shortness of breath or cough.  No diarrhea.  Complains of swelling of his right middle finger.  No pain.  He continues to wear compression stockings.  Review of Systems  Constitutional:  Negative for chills, diaphoresis, fever, malaise/fatigue and weight loss.  HENT:  Negative for nosebleeds and sore throat.   Eyes:  Negative for double  vision.  Respiratory:  Negative for hemoptysis, sputum production, shortness of breath and wheezing.   Cardiovascular:  Negative for chest pain, palpitations, orthopnea and leg swelling.  Gastrointestinal:  Negative for abdominal pain, blood in stool, constipation, diarrhea, heartburn, melena, nausea and vomiting.  Genitourinary:  Negative for dysuria, frequency and urgency.  Musculoskeletal:  Negative for back pain and joint pain.  Skin:  Negative for itching.  Neurological:  Negative for tingling, focal weakness, weakness and headaches.  Endo/Heme/Allergies:  Does not bruise/bleed easily.  Psychiatric/Behavioral:  Negative for depression. The patient is not nervous/anxious and does not have insomnia.     MEDICAL HISTORY:  Past Medical History:  Diagnosis Date   Arthritis    Dysrhythmia    A-fib   Hip pain    left   HOH (hard of hearing)    LBBB (left bundle branch block) 08/05/2020   Lung cancer ([place])    Skin cancer, basal cell     face top of head    SURGICAL HISTORY: Past Surgical History:  Procedure Laterality Date   CATARACT EXTRACTION W/PHACO Right 07/13/2017   Procedure: CATARACT EXTRACTION PHACO AND INTRAOCULAR LENS PLACEMENT ([place]);  Surgeon: Leandrew Koyanagi, MD;  Location: [facility];  Service: Ophthalmology;  Laterality: Right;  IVA TOPICAL RIGHT   CATARACT EXTRACTION W/PHACO Left 01/04/2018   Procedure: CATARACT EXTRACTION PHACO AND INTRAOCULAR LENS PLACEMENT ([place]) LEFT;  Surgeon: Leandrew Koyanagi, MD;  Location: [facility];  Service: Ophthalmology;  Laterality: Left;  COLONOSCOPY     IR IMAGING GUIDED PORT INSERTION  08/27/2020   JOINT REPLACEMENT     Left total hip Dr. Su Hoff 08-04-18   ROTATOR CUFF REPAIR Right    SKIN CANCER EXCISION     face   TOTAL HIP ARTHROPLASTY Left 08/04/2018   Procedure: TOTAL HIP ARTHROPLASTY ANTERIOR APPROACH;  Surgeon: Frederik Pear, MD;  Location: [facility];  Service: Orthopedics;   Laterality: Left;   VIDEO BRONCHOSCOPY WITH ENDOBRONCHIAL NAVIGATION N/A 08/07/2020   Procedure: VIDEO BRONCHOSCOPY WITH ENDOBRONCHIAL NAVIGATION;  Surgeon: Ottie Glazier, MD;  Location: [facility];  Service: Thoracic;  Laterality: N/A;   VIDEO BRONCHOSCOPY WITH ENDOBRONCHIAL ULTRASOUND N/A 08/07/2020   Procedure: VIDEO BRONCHOSCOPY WITH ENDOBRONCHIAL ULTRASOUND;  Surgeon: Ottie Glazier, MD;  Location: [facility];  Service: Thoracic;  Laterality: N/A;    SOCIAL HISTORY: Social History   Socioeconomic History   Marital status: Married    Spouse name: Not on file   Number of children: Not on file   Years of education: Not on file   Highest education level: Not on file  Occupational History   Not on file  Tobacco Use   Smoking status: Former    Packs/day: 1.00    Years: 4.00    Pack years: 4.00    Types: Cigarettes   Smokeless tobacco: Never   Tobacco comments:    quit early 70's  Vaping Use   Vaping Use: Never used  Substance and Sexual Activity   Alcohol use: No   Drug use: No   Sexual activity: Not Currently  Other Topics Concern   Not on file  Social History Narrative   > quit 35 years; smoked for 15 years. Rare alcohol. 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    Social Determinants of Health   Financial Resource Strain: Not on file  Food Insecurity: Not on file  Transportation Needs: Not on file  Physical Activity: Not on file  Stress: Not on file  Social Connections: Not on file  Intimate Partner Violence: Not on file    FAMILY HISTORY: Family History  Problem Relation Age of Onset   Throat cancer Brother         & lung cancer    ALLERGIES:  has No Known Allergies.  MEDICATIONS:  Current Outpatient Medications  Medication Sig Dispense Refill   acetaminophen (TYLENOL) 500 MG tablet Take 500 mg by mouth every 8 (eight) hours as needed for moderate pain.     apixaban (ELIQUIS)  5 MG TABS tablet Take 5 mg by mouth 2 (two) times daily.     hydroxypropyl methylcellulose / hypromellose (ISOPTO TEARS / GONIOVISC) 2.5 % ophthalmic solution Place 1 drop into the left eye daily as needed for dry eyes.     lidocaine-prilocaine (EMLA) cream Apply 1 application topically as needed. Apply small amount to port site at least 1 hour prior to it being accessed, cover with plastic wrap 30 g 1   Multiple Vitamin (MULTIVITAMIN) tablet Take 1 tablet by mouth daily.     pravastatin (PRAVACHOL) 80 MG tablet Take 80 mg by mouth at bedtime.      saw palmetto 160 MG capsule Take 160 mg by mouth 2 (two) times daily.     No current facility-administered medications for this visit.   Facility-Administered Medications Ordered in Other Visits  Medication Dose Route Frequency Provider Last Rate Last Admin   0.9 %  sodium  chloride infusion   Intravenous Once [name], MD       durvalumab (IMFINZI) 740 mg in sodium chloride 0.9 % 100 mL chemo infusion  10 mg/kg (Order-Specific) Intravenous Once Charlaine Dalton R, MD       heparin lock flush 100 UNIT/ML injection            heparin lock flush 100 unit/mL  500 Units Intracatheter Once PRN [name], MD       sodium chloride flush (NS) 0.9 % injection 10 mL  10 mL Intravenous PRN Charlaine Dalton R, MD   10 mL at 02/11/21 0835   sodium chloride flush (NS) 0.9 % injection 10 mL  10 mL Intracatheter PRN [name], MD       PHYSICAL EXAMINATION: ECOG PERFORMANCE STATUS: 1 - Symptomatic but completely ambulatory  Vitals:   10/15/21 1001  BP: 133/82  Pulse: (!) 55  Resp: 16  Temp: (!) 96.5 F (35.8 C)   Filed Weights   10/15/21 0956 10/15/21 1001  Weight: 162 lb 12.8 oz (73.8 kg) 162 lb 12.8 oz (73.8 kg)    Physical Exam HENT:     Head: Normocephalic and atraumatic.     Mouth/Throat:     Pharynx: No oropharyngeal exudate.  Eyes:     Pupils: Pupils are equal, round, and reactive to  light.  Cardiovascular:     Rate and Rhythm: Normal rate and regular rhythm.  Pulmonary:     Effort: Pulmonary effort is normal. No respiratory distress.     Breath sounds: No wheezing.     Comments: Slightly decreased breath sounds left lower lung base. Abdominal:     General: Bowel sounds are normal. There is no distension.     Palpations: Abdomen is soft. There is no mass.     Tenderness: There is no abdominal tenderness. There is no guarding or rebound.  Musculoskeletal:        General: No tenderness. Normal range of motion.     Cervical back: Normal range of motion and neck supple.  Skin:    General: Skin is warm.  Neurological:     Mental Status: He is alert and oriented to person, place, and time.  Psychiatric:        Mood and Affect: Affect normal.     LABORATORY DATA:  I have reviewed the data as listed Lab Results  Component Value Date   WBC 5.4 10/15/2021   HGB 14.0 10/15/2021   HCT 42.1 10/15/2021   MCV 96.8 10/15/2021   PLT 227 10/15/2021   Recent Labs    09/17/21 0849 10/01/21 0850 10/15/21 0911  NA 133* 136 136  K 4.3 4.0 4.3  CL 99 101 104  CO2 24 25 26   GLUCOSE 143* 146* 115*  BUN 22 19 19   CREATININE 0.98 0.88 0.78  CALCIUM 8.7* 8.8* 8.8*  GFRNONAA >60 >60 >60  PROT 7.3 7.1 7.2  ALBUMIN 3.8 4.0 4.1  AST 32 31 61*  ALT 23 18 28   ALKPHOS 143* 123 120  BILITOT 0.9 0.8 1.0    RADIOGRAPHIC STUDIES: I have personally reviewed the radiological images as listed and agreed with the findings in the report. No results found.   ASSESSMENT & PLAN:   Cancer of lower lobe of left lung ([place]) # T2N3- Stage III-non-small cell lung cancer-[favor adenocarcinoma]- s/p  concurrent chemoradiation; Finished Feb 2022]; currently on adjuvant durvalumab. CT scan- NOV 7th, 2022-continued left lower lobe architectural distortion/fibrosis without any  evidence of recurrent disease.  Small stable left-sided pleural effusion [see below]; right lower lobe peribronchial  architectural distortion [chronic unrelated to malignancy/treatment]. STABLE.   # proceed with IMFINZI [until the end of February 2023] Labs today reviewed;  acceptable for treatment today. DEC 2022-TSH-WNL.  Plan CT again FEB 2023.   #CT scan-August 10, 2021- medial right middle lobe 3 mm nodule is new since the prior exam-monitor for now. Stable.  # Mild evekation of AST-61-? From Imfinzi- monitor for now- Stable.  # left-sided pleural effusion-CT November 2022. Hold off any thoracentesis- Stable.  # A. fib on Eliquis [Dr.Kowalski]-  Mild swelling in legs- EF in Feb 2022- 55-60%; continue BIL compression stocking/leg elevation- STABLE.   # RIGHT middle finger swollen- no pain- recommend Tylenol/advil prn.   # DISPOSITION: # proceed with  IMFINZI today; # follow up in 2 weeks-MD labs- cbc/cmp;;IMFINZI;Dr.B    All questions were answered. The patient knows to call the clinic with any problems, questions or concerns.   [name], MD 10/15/2021 10:35 AM

## 2021-10-15 NOTE — Patient Instructions (Signed)
[facility] CANCER CTR AT [place]  Discharge Instructions: Thank you for choosing [facility] to provide your oncology and hematology care.  If you have a lab appointment with the [facility], please go directly to the [facility] and check in at the registration area.  Wear comfortable clothing and clothing appropriate for easy access to any Portacath or PICC line.   We strive to give you quality time with your provider. You may need to reschedule your appointment if you arrive late (15 or more minutes).  Arriving late affects you and other patients whose appointments are after yours.  Also, if you miss three or more appointments without notifying the office, you may be dismissed from the clinic at the providers discretion.      For prescription refill requests, have your pharmacy contact our office and allow 72 hours for refills to be completed.    Today you received the following chemotherapy and/or immunotherapy agents - durvalumab      To help prevent nausea and vomiting after your treatment, we encourage you to take your nausea medication as directed.  BELOW ARE SYMPTOMS THAT SHOULD BE REPORTED IMMEDIATELY: *FEVER GREATER THAN 100.4 F (38 C) OR HIGHER *CHILLS OR SWEATING *NAUSEA AND VOMITING THAT IS NOT CONTROLLED WITH YOUR NAUSEA MEDICATION *UNUSUAL SHORTNESS OF BREATH *UNUSUAL BRUISING OR BLEEDING *URINARY PROBLEMS (pain or burning when urinating, or frequent urination) *BOWEL PROBLEMS (unusual diarrhea, constipation, pain near the anus) TENDERNESS IN MOUTH AND THROAT WITH OR WITHOUT PRESENCE OF ULCERS (sore throat, sores in mouth, or a toothache) UNUSUAL RASH, SWELLING OR PAIN  UNUSUAL VAGINAL DISCHARGE OR ITCHING   Items with * indicate a potential emergency and should be followed up as soon as possible or go to the Emergency Department if any problems should occur.  Please show the CHEMOTHERAPY ALERT CARD or IMMUNOTHERAPY ALERT CARD at check-in  to the Emergency Department and triage nurse.  Should you have questions after your visit or need to cancel or reschedule your appointment, please contact [facility] CANCER [place] AT [place]  [phone] and follow the prompts.  Office hours are 8:00 a.m. to 4:30 p.m. Monday - Friday. Please note that voicemails left after 4:00 p.m. may not be returned until the following business day.  We are closed weekends and major holidays. You have access to a nurse at all times for urgent questions. Please call the main number to the clinic [phone] and follow the prompts.  For any non-urgent questions, you may also contact your provider using MyChart. We now offer e-Visits for anyone 18 and older to request care online for non-urgent symptoms. For details visit [email or web address].   Also download the MyChart app! Go to the app store, search "MyChart", open the app, select [facility], and log in with your MyChart username and password.  Due to Covid, a mask is required upon entering the hospital/clinic. If you do not have a mask, one will be given to you upon arrival. For doctor visits, patients may have 1 support person aged 62 or older with them. For treatment visits, patients cannot have anyone with them due to current Covid guidelines and our immunocompromised population.   Durvalumab injection What is this medication? DURVALUMAB (dur VAL ue mab) is a monoclonal antibody. It is used to treat lung cancer. This medicine may be used for other purposes; ask your health care provider or pharmacist if you have questions. COMMON BRAND NAME(S): IMFINZI What should I tell my care team before I  take this medication? They need to know if you have any of these conditions: autoimmune diseases like Crohn's disease, ulcerative colitis, or lupus have had or planning to have an allogeneic stem cell transplant (uses someone else's stem cells) history of organ transplant history of radiation to the  chest nervous system problems like myasthenia gravis or Guillain-Barre syndrome an unusual or allergic reaction to durvalumab, other medicines, foods, dyes, or preservatives pregnant or trying to get pregnant breast-feeding How should I use this medication? This medicine is for infusion into a vein. It is given by a health care professional in a hospital or clinic setting. A special MedGuide will be given to you before each treatment. Be sure to read this information carefully each time. Talk to your pediatrician regarding the use of this medicine in children. Special care may be needed. Overdosage: If you think you have taken too much of this medicine contact a poison control center or emergency room at once. NOTE: This medicine is only for you. Do not share this medicine with others. What if I miss a dose? It is important not to miss your dose. Call your doctor or health care professional if you are unable to keep an appointment. What may interact with this medication? Interactions have not been studied. This list may not describe all possible interactions. Give your health care provider a list of all the medicines, herbs, non-prescription drugs, or dietary supplements you use. Also tell them if you smoke, drink alcohol, or use illegal drugs. Some items may interact with your medicine. What should I watch for while using this medication? This medication may make you feel generally unwell. Continue your course of treatment even though you feel ill unless your care team tells you to stop. You may need blood work done while you are taking this medication. Do not become pregnant while taking this medication or for 3 months after stopping it. Women should inform their care team if they wish to become pregnant or think they might be pregnant. There is a potential for serious side effects to an unborn child. Talk to your care team or pharmacist for more information. Do not breast-feed an infant while  taking this medication or for 3 months after stopping it. What side effects may I notice from receiving this medication? Side effects that you should report to your care team as soon as possible: Allergic reactions--skin rash, itching, hives, swelling of the face, lips, tongue, or throat Bloody or watery diarrhea Dizziness, loss of balance or coordination, confusion or trouble speaking Dry cough, shortness of breath or trouble breathing Flushing, mostly over the face, neck, and chest, during injection High blood sugar (hyperglycemia)--increased thirst or amount of urine, unusual weakness or fatigue, blurry vision High thyroid levels (hyperthyroidism)--fast or irregular heartbeat, weight loss, excessive sweating or sensitivity to heat, tremors or shaking, anxiety, nervousness, irregular menstrual cycle or spotting Infection--fever, chills, cough, or sore throat Liver injury--right upper belly pain, loss of appetite, nausea, light-colored stool, dark yellow or brown urine, yellowing skin or eyes, unusual weakness or fatigue Low adrenal gland function--nausea, vomiting, loss of appetite, unusual weakness or fatigue, dizziness, low blood pressure Low thyroid levels (hypothyroidism)--unusual weakness or fatigue, increased sensitivity to cold, constipation, hair loss, dry skin, weight gain, feelings of depression Pancreatitis--severe stomach pain that spreads to your back or gets worse after eating or when touched, fever, nausea, vomiting Rash, fever, and swollen lymph nodes Redness, blistering, peeling or loosening of the skin, including inside the mouth Wheezing--trouble breathing  with loud or whistling sounds Side effects that usually do not require medical attention (report these to your care team if they continue or are bothersome): Fatigue Hair loss This list may not describe all possible side effects. Call your doctor for medical advice about side effects. You may report side effects to FDA at  1-800-FDA-1088. Where should I keep my medication? This medication is given in a hospital or clinic. It will not be stored at home. NOTE: This sheet is a summary. It may not cover all possible information. If you have questions about this medicine, talk to your doctor, pharmacist, or health care provider.  2022 Elsevier/Gold Standard (2021-06-09 00:00:00)

## 2021-10-15 NOTE — Assessment & Plan Note (Addendum)
#   T2N3- Stage III-non-small cell lung cancer-[favor adenocarcinoma]- s/p  concurrent chemoradiation; Finished Feb 2022]; currently on adjuvant durvalumab. CT scan- NOV 7th, 2022-continued left lower lobe architectural distortion/fibrosis without any evidence of recurrent disease.  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 STABLE.   # proceed with IMFINZI [until the end of February 2023] Labs today reviewed;  acceptable for treatment today. DEC 2022-TSH-WNL.  Plan CT again FEB 2023.   #CT scan-August 10, 2021- medial right middle lobe 3 mm nodule is new since the prior exam-monitor for now. Stable.  # Mild evekation of AST-61-? From Imfinzi- monitor for now- Stable.  # left-sided pleural effusion-CT November 2022. Hold off any thoracentesis- Stable.  # A. fib on Eliquis [Dr.Kowalski]-  Mild swelling in legs- EF in Feb 2022- 55-60%; continue BIL compression stocking/leg elevation- STABLE.   # RIGHT middle finger swollen- no pain- recommend Tylenol/advil prn.   # DISPOSITION: # proceed with  IMFINZI today; # follow up in 2 weeks-MD labs- cbc/cmp;;IMFINZI;Dr.B

## 2021-10-19 ENCOUNTER — Ambulatory Visit: Payer: Medicare Other | Admitting: Radiation Oncology

## 2021-10-30 ENCOUNTER — Other Ambulatory Visit: Payer: Self-pay

## 2021-10-30 ENCOUNTER — Inpatient Hospital Stay (HOSPITAL_BASED_OUTPATIENT_CLINIC_OR_DEPARTMENT_OTHER): Payer: Medicare Other | Admitting: Internal Medicine

## 2021-10-30 ENCOUNTER — Encounter: Payer: Self-pay | Admitting: Internal Medicine

## 2021-10-30 ENCOUNTER — Inpatient Hospital Stay: Payer: Medicare Other

## 2021-10-30 DIAGNOSIS — Z5112 Encounter for antineoplastic immunotherapy: Secondary | ICD-10-CM | POA: Diagnosis not present

## 2021-10-30 DIAGNOSIS — C3432 Malignant neoplasm of lower lobe, left bronchus or lung: Secondary | ICD-10-CM | POA: Diagnosis not present

## 2021-10-30 LAB — COMPREHENSIVE METABOLIC PANEL
ALT: 24 U/L (ref 0–44)
AST: 32 U/L (ref 15–41)
Albumin: 4.1 g/dL (ref 3.5–5.0)
Alkaline Phosphatase: 128 U/L — ABNORMAL HIGH (ref 38–126)
Anion gap: 8 (ref 5–15)
BUN: 20 mg/dL (ref 8–23)
CO2: 26 mmol/L (ref 22–32)
Calcium: 9.3 mg/dL (ref 8.9–10.3)
Chloride: 102 mmol/L (ref 98–111)
Creatinine, Ser: 0.9 mg/dL (ref 0.61–1.24)
GFR, Estimated: >60 mL/min (ref >60)
Glucose, Bld: 120 mg/dL — ABNORMAL HIGH (ref 70–99)
Potassium: 4.3 mmol/L (ref 3.5–5.1)
Sodium: 136 mmol/L (ref 135–145)
Total Bilirubin: 0.9 mg/dL (ref 0.3–1.2)
Total Protein: 7.4 g/dL (ref 6.5–8.1)

## 2021-10-30 LAB — CBC WITH DIFFERENTIAL/PLATELET
Abs Immature Granulocytes: 0.01 10*3/uL (ref 0.00–0.07)
Basophils Absolute: 0 10*3/uL (ref 0.0–0.1)
Basophils Relative: 1 %
Eosinophils Absolute: 0.1 10*3/uL (ref 0.0–0.5)
Eosinophils Relative: 2 %
HCT: 44 % (ref 39.0–52.0)
Hemoglobin: 14.4 g/dL (ref 13.0–17.0)
Immature Granulocytes: 0 %
Lymphocytes Relative: 24 %
Lymphs Abs: 1.3 10*3/uL (ref 0.7–4.0)
MCH: 32.3 pg (ref 26.0–34.0)
MCHC: 32.7 g/dL (ref 30.0–36.0)
MCV: 98.7 fL (ref 80.0–100.0)
Monocytes Absolute: 0.7 10*3/uL (ref 0.1–1.0)
Monocytes Relative: 13 %
Neutro Abs: 3.2 10*3/uL (ref 1.7–7.7)
Neutrophils Relative %: 60 %
Platelets: 215 10*3/uL (ref 150–400)
RBC: 4.46 MIL/uL (ref 4.22–5.81)
RDW: 13.1 % (ref 11.5–15.5)
WBC: 5.4 10*3/uL (ref 4.0–10.5)
nRBC: 0 % (ref 0.0–0.2)

## 2021-10-30 LAB — TSH: TSH: 3.395 u[IU]/mL (ref 0.350–4.500)

## 2021-10-30 MED ORDER — HEPARIN SOD (PORK) LOCK FLUSH 100 UNIT/ML IV SOLN
INTRAVENOUS | Status: AC
  Administered 2021-10-30: 500 [IU]
  Filled 2021-10-30: qty 5

## 2021-10-30 MED ORDER — SODIUM CHLORIDE 0.9 % IV SOLN
Freq: Once | INTRAVENOUS | Status: AC
  Filled 2021-10-30: qty 250

## 2021-10-30 MED ORDER — SODIUM CHLORIDE 0.9% FLUSH
10.0000 mL | INTRAVENOUS | Status: DC | PRN
  Administered 2021-10-30: 10 mL via INTRAVENOUS
  Filled 2021-10-30: qty 10

## 2021-10-30 MED ORDER — HEPARIN SOD (PORK) LOCK FLUSH 100 UNIT/ML IV SOLN
500.0000 [IU] | Freq: Once | INTRAVENOUS | Status: AC | PRN
  Filled 2021-10-30: qty 5

## 2021-10-30 MED ORDER — SODIUM CHLORIDE 0.9 % IV SOLN
10.0000 mg/kg | Freq: Once | INTRAVENOUS | Status: AC
  Administered 2021-10-30: 740 mg via INTRAVENOUS
  Filled 2021-10-30: qty 10

## 2021-10-30 MED ORDER — HEPARIN SOD (PORK) LOCK FLUSH 100 UNIT/ML IV SOLN
500.0000 [IU] | Freq: Once | INTRAVENOUS | Status: DC
  Filled 2021-10-30: qty 5

## 2021-10-30 NOTE — Patient Instructions (Signed)
[facility] CANCER CTR AT [place]  Discharge Instructions: Thank you for choosing [facility] to provide your oncology and hematology care.  If you have a lab appointment with the [facility], please go directly to the [facility] and check in at the registration area.  Wear comfortable clothing and clothing appropriate for easy access to any Portacath or PICC line.   We strive to give you quality time with your provider. You may need to reschedule your appointment if you arrive late (15 or more minutes).  Arriving late affects you and other patients whose appointments are after yours.  Also, if you miss three or more appointments without notifying the office, you may be dismissed from the clinic at the providers discretion.      For prescription refill requests, have your pharmacy contact our office and allow 72 hours for refills to be completed.    Today you received the following chemotherapy and/or immunotherapy agents Imfinzi       To help prevent nausea and vomiting after your treatment, we encourage you to take your nausea medication as directed.  BELOW ARE SYMPTOMS THAT SHOULD BE REPORTED IMMEDIATELY: *FEVER GREATER THAN 100.4 F (38 C) OR HIGHER *CHILLS OR SWEATING *NAUSEA AND VOMITING THAT IS NOT CONTROLLED WITH YOUR NAUSEA MEDICATION *UNUSUAL SHORTNESS OF BREATH *UNUSUAL BRUISING OR BLEEDING *URINARY PROBLEMS (pain or burning when urinating, or frequent urination) *BOWEL PROBLEMS (unusual diarrhea, constipation, pain near the anus) TENDERNESS IN MOUTH AND THROAT WITH OR WITHOUT PRESENCE OF ULCERS (sore throat, sores in mouth, or a toothache) UNUSUAL RASH, SWELLING OR PAIN  UNUSUAL VAGINAL DISCHARGE OR ITCHING   Items with * indicate a potential emergency and should be followed up as soon as possible or go to the Emergency Department if any problems should occur.  Please show the CHEMOTHERAPY ALERT CARD or IMMUNOTHERAPY ALERT CARD at check-in to  the Emergency Department and triage nurse.  Should you have questions after your visit or need to cancel or reschedule your appointment, please contact [facility] CANCER [place] AT [place]  [phone] and follow the prompts.  Office hours are 8:00 a.m. to 4:30 p.m. Monday - Friday. Please note that voicemails left after 4:00 p.m. may not be returned until the following business day.  We are closed weekends and major holidays. You have access to a nurse at all times for urgent questions. Please call the main number to the clinic [phone] and follow the prompts.  For any non-urgent questions, you may also contact your provider using MyChart. We now offer e-Visits for anyone 38 and older to request care online for non-urgent symptoms. For details visit [email or web address].   Also download the MyChart app! Go to the app store, search "MyChart", open the app, select [facility], and log in with your MyChart username and password.  Due to Covid, a mask is required upon entering the hospital/clinic. If you do not have a mask, one will be given to you upon arrival. For doctor visits, patients may have 1 support person aged 68 or older with them. For treatment visits, patients cannot have anyone with them due to current Covid guidelines and our immunocompromised population.

## 2021-10-30 NOTE — Progress Notes (Signed)
[facility] NOTE  Patient Care Team: Baxter Hire, MD as PCP - General (Internal Medicine) [name], RN as Oncology Nurse Navigator [name], MD as Consulting Physician (Hematology and Oncology)  CHIEF COMPLAINTS/PURPOSE OF CONSULTATION: Lung cancer  #  Oncology History Overview Note  # NOV 2021- LEFT LOWER LOBE NON-SMALL CELL CA [favor adeno ca]; T2N3 [right hilar; subcarinal LN;[name]; NOV 2021-MRI [place]  # 11/30- carbo-Taxol-RT [RT until 10/23/20]  # Right lower lobe - ~4.6 x 3.1 cm  [PET 07-2020]- demonstrates no significant hypermetabolic activity (SUV max 1.8]-benign. STABLE.   # A.fib [Eliquis; Dr.Kowalski]  # # SURVIVORSHIP:   # GENETICS:   #NGS-ordered  DIAGNOSIS: Lung cancer  STAGE:   III      ;  GOALS:  cure  CURRENT/MOST RECENT THERAPY : carbo-Taxol-RT ]   Cancer of lower lobe of left lung ([place])  08/14/2020 Initial Diagnosis   Cancer of lower lobe of left lung ([place])   09/02/2020 - 10/22/2020 Chemotherapy    Patient is on Treatment Plan: LUNG DURVALUMAB Q14D       10/16/2020 Cancer Staging   Staging form: Lung, AJCC 8th Edition - Clinical: Stage IIIB (cT2a, cN3, cM0) - Signed by [name], MD on 10/16/2020    12/17/2020 -  Chemotherapy   Patient is on Treatment Plan : LUNG Durvalumab q14d      HISTORY OF PRESENTING ILLNESS: Walking independently but with a limp because of left hip arthritis.  He is alone.  Jason Warren 88 y.o.  male patient with stage III lung non-small cell lung-currently adjuvant duvalumab is here for follow-up.  Denies any new shortness of breath or cough.  Denies any swelling in the legs.  Continues to wear compression stockings.  Swelling on the right middle finger is improved on icing.  Review of Systems  Constitutional:  Negative for chills, diaphoresis, fever, malaise/fatigue and weight loss.  HENT:  Negative for nosebleeds and sore throat.   Eyes:  Negative  for double vision.  Respiratory:  Negative for hemoptysis, sputum production, shortness of breath and wheezing.   Cardiovascular:  Negative for chest pain, palpitations, orthopnea and leg swelling.  Gastrointestinal:  Negative for abdominal pain, blood in stool, constipation, diarrhea, heartburn, melena, nausea and vomiting.  Genitourinary:  Negative for dysuria, frequency and urgency.  Musculoskeletal:  Negative for back pain and joint pain.  Skin:  Negative for itching.  Neurological:  Negative for tingling, focal weakness, weakness and headaches.  Endo/Heme/Allergies:  Does not bruise/bleed easily.  Psychiatric/Behavioral:  Negative for depression. The patient is not nervous/anxious and does not have insomnia.     MEDICAL HISTORY:  Past Medical History:  Diagnosis Date   Arthritis    Dysrhythmia    A-fib   Hip pain    left   HOH (hard of hearing)    LBBB (left bundle branch block) 08/05/2020   Lung cancer ([place])    Skin cancer, basal cell     face top of head    SURGICAL HISTORY: Past Surgical History:  Procedure Laterality Date   CATARACT EXTRACTION W/PHACO Right 07/13/2017   Procedure: CATARACT EXTRACTION PHACO AND INTRAOCULAR LENS PLACEMENT ([place]);  Surgeon: Leandrew Koyanagi, MD;  Location: [facility];  Service: Ophthalmology;  Laterality: Right;  IVA TOPICAL RIGHT   CATARACT EXTRACTION W/PHACO Left 01/04/2018   Procedure: CATARACT EXTRACTION PHACO AND INTRAOCULAR LENS PLACEMENT ([place]) LEFT;  Surgeon: Leandrew Koyanagi, MD;  Location: [facility];  Service: Ophthalmology;  Laterality: Left;   COLONOSCOPY     IR IMAGING GUIDED PORT INSERTION  08/27/2020   JOINT REPLACEMENT     Left total hip Dr. Su Hoff 08-04-18   ROTATOR CUFF REPAIR Right    SKIN CANCER EXCISION     face   TOTAL HIP ARTHROPLASTY Left 08/04/2018   Procedure: TOTAL HIP ARTHROPLASTY ANTERIOR APPROACH;  Surgeon: Frederik Pear, MD;  Location: [facility];  Service: Orthopedics;  Laterality:  Left;   VIDEO BRONCHOSCOPY WITH ENDOBRONCHIAL NAVIGATION N/A 08/07/2020   Procedure: VIDEO BRONCHOSCOPY WITH ENDOBRONCHIAL NAVIGATION;  Surgeon: Ottie Glazier, MD;  Location: [facility];  Service: Thoracic;  Laterality: N/A;   VIDEO BRONCHOSCOPY WITH ENDOBRONCHIAL ULTRASOUND N/A 08/07/2020   Procedure: VIDEO BRONCHOSCOPY WITH ENDOBRONCHIAL ULTRASOUND;  Surgeon: Ottie Glazier, MD;  Location: [facility];  Service: Thoracic;  Laterality: N/A;    SOCIAL HISTORY: Social History   Socioeconomic History   Marital status: Married    Spouse name: Not on file   Number of children: Not on file   Years of education: Not on file   Highest education level: Not on file  Occupational History   Not on file  Tobacco Use   Smoking status: Former    Packs/day: 1.00    Years: 4.00    Pack years: 4.00    Types: Cigarettes   Smokeless tobacco: Never   Tobacco comments:    quit early 70's  Vaping Use   Vaping Use: Never used  Substance and Sexual Activity   Alcohol use: No   Drug use: No   Sexual activity: Not Currently  Other Topics Concern   Not on file  Social History Narrative   > quit 35 years; smoked for 15 years. Rare alcohol. 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    Social Determinants of Health   Financial Resource Strain: Not on file  Food Insecurity: Not on file  Transportation Needs: Not on file  Physical Activity: Not on file  Stress: Not on file  Social Connections: Not on file  Intimate Partner Violence: Not on file    FAMILY HISTORY: Family History  Problem Relation Age of Onset   Throat cancer Brother         & lung cancer    ALLERGIES:  has No Known Allergies.  MEDICATIONS:  Current Outpatient Medications  Medication Sig Dispense Refill   acetaminophen (TYLENOL) 500 MG tablet Take 500 mg by mouth every 8 (eight) hours as needed for moderate pain.     apixaban (ELIQUIS) 5 MG TABS tablet Take 5 mg by  mouth 2 (two) times daily.     hydroxypropyl methylcellulose / hypromellose (ISOPTO TEARS / GONIOVISC) 2.5 % ophthalmic solution Place 1 drop into the left eye daily as needed for dry eyes.     lidocaine-prilocaine (EMLA) cream Apply 1 application topically as needed. Apply small amount to port site at least 1 hour prior to it being accessed, cover with plastic wrap 30 g 1   Multiple Vitamin (MULTIVITAMIN) tablet Take 1 tablet by mouth daily.     pravastatin (PRAVACHOL) 80 MG tablet Take 80 mg by mouth at bedtime.      saw palmetto 160 MG capsule Take 160 mg by mouth 2 (two) times daily.     No current facility-administered medications for this visit.   Facility-Administered Medications Ordered in Other Visits  Medication Dose Route Frequency Provider Last Rate Last Admin  durvalumab (IMFINZI) 740 mg in sodium chloride 0.9 % 100 mL chemo infusion  10 mg/kg (Order-Specific) Intravenous Once Charlaine Dalton R, MD       heparin lock flush 100 UNIT/ML injection            heparin lock flush 100 unit/mL  500 Units Intracatheter Once PRN Charlaine Dalton R, MD       sodium chloride flush (NS) 0.9 % injection 10 mL  10 mL Intravenous PRN [name], MD   10 mL at 02/11/21 0835   PHYSICAL EXAMINATION: ECOG PERFORMANCE STATUS: 1 - Symptomatic but completely ambulatory  Vitals:   10/30/21 0847  BP: 138/66  Pulse: 68  Temp: (!) 95.4 F (35.2 C)  SpO2: 100%   Filed Weights   10/30/21 0847  Weight: 162 lb 3.2 oz (73.6 kg)    Physical Exam HENT:     Head: Normocephalic and atraumatic.     Mouth/Throat:     Pharynx: No oropharyngeal exudate.  Eyes:     Pupils: Pupils are equal, round, and reactive to light.  Cardiovascular:     Rate and Rhythm: Normal rate and regular rhythm.  Pulmonary:     Effort: Pulmonary effort is normal. No respiratory distress.     Breath sounds: No wheezing.     Comments: Slightly decreased breath sounds left lower lung base. Abdominal:      General: Bowel sounds are normal. There is no distension.     Palpations: Abdomen is soft. There is no mass.     Tenderness: There is no abdominal tenderness. There is no guarding or rebound.  Musculoskeletal:        General: No tenderness. Normal range of motion.     Cervical back: Normal range of motion and neck supple.  Skin:    General: Skin is warm.  Neurological:     Mental Status: He is alert and oriented to person, place, and time.  Psychiatric:        Mood and Affect: Affect normal.     LABORATORY DATA:  I have reviewed the data as listed Lab Results  Component Value Date   WBC 5.4 10/30/2021   HGB 14.4 10/30/2021   HCT 44.0 10/30/2021   MCV 98.7 10/30/2021   PLT 215 10/30/2021   Recent Labs    10/01/21 0850 10/15/21 0911 10/30/21 0829  NA 136 136 136  K 4.0 4.3 4.3  CL 101 104 102  CO2 25 26 26   GLUCOSE 146* 115* 120*  BUN 19 19 20   CREATININE 0.88 0.78 0.90  CALCIUM 8.8* 8.8* 9.3  GFRNONAA >60 >60 >60  PROT 7.1 7.2 7.4  ALBUMIN 4.0 4.1 4.1  AST 31 61* 32  ALT 18 28 24   ALKPHOS 123 120 128*  BILITOT 0.8 1.0 0.9    RADIOGRAPHIC STUDIES: I have personally reviewed the radiological images as listed and agreed with the findings in the report. No results found.   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 CT scan- NOV 7th, 2022-continued left lower lobe architectural distortion/fibrosis without any evidence of recurrent disease.  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 STABLE.   # proceed with IMFINZI [until the end of February 2023] Labs today reviewed;  acceptable for treatment today. DEC 2022-TSH-WNL.  Plan CT again  FEB 2023/ordered today. [facility]   #CT scan-August 10, 2021- medial right middle lobe 3 mm nodule is new since the prior exam-monitor for now. STABLE. Will repeat scan/ordered today.  We will add TSH today.  # Mild intermittent elevation of of Alk phos-? From Imfinzi- monitor for now- Stable.  # left-sided pleural effusion-CT November 2022. Hold off any thoracentesis- STABLE.   # A. fib on Eliquis [Dr.Kowalski]-  Mild swelling in legs- EF in Feb 2022- 55-60%; continue BIL compression stocking/leg elevation- STABLE.   # RIGHT middle finger swollen- no pain- recommend Tylenol/advil prn- STABLE.   # DISPOSITION: # proceed with  IMFINZI today; # follow up in 2 weeks-MD labs- cbc/cmp;;IMFINZI CT chest prior-;Dr.B    All questions were answered. The patient knows to call the clinic with any problems, questions or concerns.   [name], MD 10/30/2021 9:41 AM

## 2021-10-30 NOTE — Assessment & Plan Note (Addendum)
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 CT scan- NOV 7th, 2022-continued left lower lobe architectural distortion/fibrosis without any evidence of recurrent disease.  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 STABLE.   # proceed with IMFINZI [until the end of February 2023] Labs today reviewed;  acceptable for treatment today. DEC 2022-TSH-WNL.  Plan CT again FEB 2023/ordered today. Marland Kitchen   #CT scan-August 10, 2021- medial right middle lobe 3 mm nodule is new since the prior exam-monitor for now. STABLE. Will repeat scan/ordered today.  We will add TSH today.  # Mild intermittent elevation of of Alk phos-? From Imfinzi- monitor for now- Stable.  # left-sided pleural effusion-CT November 2022. Hold off any thoracentesis- STABLE.   # A. fib on Eliquis [Dr.Kowalski]-  Mild swelling in legs- EF in Feb 2022- 55-60%; continue BIL compression stocking/leg elevation- STABLE.   # RIGHT middle finger swollen- no pain- recommend Tylenol/advil prn- STABLE.   # DISPOSITION: # proceed with  IMFINZI today; # follow up in 2 weeks-MD labs- cbc/cmp;;IMFINZI CT chest prior-;Dr.B

## 2021-11-03 ENCOUNTER — Ambulatory Visit
Admission: RE | Admit: 2021-11-03 | Discharge: 2021-11-03 | Disposition: A | Discharge status: Home / Self Care | Payer: Medicare Other | Source: Ambulatory Visit | Attending: Internal Medicine | Admitting: Internal Medicine

## 2021-11-03 ENCOUNTER — Other Ambulatory Visit: Payer: Self-pay

## 2021-11-03 DIAGNOSIS — C3432 Malignant neoplasm of lower lobe, left bronchus or lung: Secondary | ICD-10-CM | POA: Diagnosis not present

## 2021-11-13 ENCOUNTER — Inpatient Hospital Stay: Payer: Medicare Other

## 2021-11-13 ENCOUNTER — Inpatient Hospital Stay (HOSPITAL_BASED_OUTPATIENT_CLINIC_OR_DEPARTMENT_OTHER): Payer: Medicare Other | Admitting: Internal Medicine

## 2021-11-13 ENCOUNTER — Other Ambulatory Visit: Payer: Self-pay

## 2021-11-13 ENCOUNTER — Encounter: Payer: Self-pay | Admitting: Internal Medicine

## 2021-11-13 ENCOUNTER — Inpatient Hospital Stay: Payer: Medicare Other | Attending: Internal Medicine

## 2021-11-13 DIAGNOSIS — J90 Pleural effusion, not elsewhere classified: Secondary | ICD-10-CM | POA: Insufficient documentation

## 2021-11-13 DIAGNOSIS — C3432 Malignant neoplasm of lower lobe, left bronchus or lung: Secondary | ICD-10-CM | POA: Diagnosis present

## 2021-11-13 DIAGNOSIS — Z7901 Long term (current) use of anticoagulants: Secondary | ICD-10-CM | POA: Diagnosis not present

## 2021-11-13 DIAGNOSIS — Z9221 Personal history of antineoplastic chemotherapy: Secondary | ICD-10-CM | POA: Insufficient documentation

## 2021-11-13 DIAGNOSIS — Z5112 Encounter for antineoplastic immunotherapy: Secondary | ICD-10-CM | POA: Insufficient documentation

## 2021-11-13 DIAGNOSIS — M7989 Other specified soft tissue disorders: Secondary | ICD-10-CM | POA: Insufficient documentation

## 2021-11-13 DIAGNOSIS — I4891 Unspecified atrial fibrillation: Secondary | ICD-10-CM | POA: Insufficient documentation

## 2021-11-13 DIAGNOSIS — Z87891 Personal history of nicotine dependence: Secondary | ICD-10-CM | POA: Insufficient documentation

## 2021-11-13 DIAGNOSIS — R1310 Dysphagia, unspecified: Secondary | ICD-10-CM | POA: Insufficient documentation

## 2021-11-13 DIAGNOSIS — Z923 Personal history of irradiation: Secondary | ICD-10-CM | POA: Insufficient documentation

## 2021-11-13 DIAGNOSIS — R748 Abnormal levels of other serum enzymes: Secondary | ICD-10-CM | POA: Insufficient documentation

## 2021-11-13 LAB — COMPREHENSIVE METABOLIC PANEL
ALT: 16 U/L (ref 0–44)
AST: 24 U/L (ref 15–41)
Albumin: 3.8 g/dL (ref 3.5–5.0)
Alkaline Phosphatase: 116 U/L (ref 38–126)
Anion gap: 6 (ref 5–15)
BUN: 18 mg/dL (ref 8–23)
CO2: 24 mmol/L (ref 22–32)
Calcium: 8.5 mg/dL — ABNORMAL LOW (ref 8.9–10.3)
Chloride: 103 mmol/L (ref 98–111)
Creatinine, Ser: 0.9 mg/dL (ref 0.61–1.24)
GFR, Estimated: >60 mL/min (ref >60)
Glucose, Bld: 140 mg/dL — ABNORMAL HIGH (ref 70–99)
Potassium: 4.1 mmol/L (ref 3.5–5.1)
Sodium: 133 mmol/L — ABNORMAL LOW (ref 135–145)
Total Bilirubin: 0.8 mg/dL (ref 0.3–1.2)
Total Protein: 7 g/dL (ref 6.5–8.1)

## 2021-11-13 LAB — CBC WITH DIFFERENTIAL/PLATELET
Abs Immature Granulocytes: 0.02 10*3/uL (ref 0.00–0.07)
Basophils Absolute: 0 10*3/uL (ref 0.0–0.1)
Basophils Relative: 0 %
Eosinophils Absolute: 0.1 10*3/uL (ref 0.0–0.5)
Eosinophils Relative: 2 %
HCT: 40.5 % (ref 39.0–52.0)
Hemoglobin: 13.5 g/dL (ref 13.0–17.0)
Immature Granulocytes: 0 %
Lymphocytes Relative: 19 %
Lymphs Abs: 1.2 10*3/uL (ref 0.7–4.0)
MCH: 32.3 pg (ref 26.0–34.0)
MCHC: 33.3 g/dL (ref 30.0–36.0)
MCV: 96.9 fL (ref 80.0–100.0)
Monocytes Absolute: 0.6 10*3/uL (ref 0.1–1.0)
Monocytes Relative: 10 %
Neutro Abs: 4.3 10*3/uL (ref 1.7–7.7)
Neutrophils Relative %: 69 %
Platelets: 244 10*3/uL (ref 150–400)
RBC: 4.18 MIL/uL — ABNORMAL LOW (ref 4.22–5.81)
RDW: 12.8 % (ref 11.5–15.5)
WBC: 6.3 10*3/uL (ref 4.0–10.5)
nRBC: 0 % (ref 0.0–0.2)

## 2021-11-13 MED ORDER — SODIUM CHLORIDE 0.9% FLUSH
10.0000 mL | INTRAVENOUS | Status: DC | PRN
  Filled 2021-11-13: qty 10

## 2021-11-13 MED ORDER — SODIUM CHLORIDE 0.9 % IV SOLN
Freq: Once | INTRAVENOUS | Status: AC
  Filled 2021-11-13: qty 250

## 2021-11-13 MED ORDER — HEPARIN SOD (PORK) LOCK FLUSH 100 UNIT/ML IV SOLN
500.0000 [IU] | Freq: Once | INTRAVENOUS | Status: AC | PRN
  Filled 2021-11-13: qty 5

## 2021-11-13 MED ORDER — HEPARIN SOD (PORK) LOCK FLUSH 100 UNIT/ML IV SOLN
INTRAVENOUS | Status: AC
  Administered 2021-11-13: 500 [IU]
  Filled 2021-11-13: qty 5

## 2021-11-13 MED ORDER — SODIUM CHLORIDE 0.9 % IV SOLN
10.0000 mg/kg | Freq: Once | INTRAVENOUS | Status: AC
  Administered 2021-11-13: 740 mg via INTRAVENOUS
  Filled 2021-11-13: qty 10

## 2021-11-13 NOTE — Patient Instructions (Signed)
[facility] CANCER CTR AT [place]  Discharge Instructions: Thank you for choosing [facility] to provide your oncology and hematology care.  If you have a lab appointment with the [facility], please go directly to the [facility] and check in at the registration area.  Wear comfortable clothing and clothing appropriate for easy access to any Portacath or PICC line.   We strive to give you quality time with your provider. You may need to reschedule your appointment if you arrive late (15 or more minutes).  Arriving late affects you and other patients whose appointments are after yours.  Also, if you miss three or more appointments without notifying the office, you may be dismissed from the clinic at the providers discretion.      For prescription refill requests, have your pharmacy contact our office and allow 72 hours for refills to be completed.    Today you received the following chemotherapy and/or immunotherapy agents Imfinzi      To help prevent nausea and vomiting after your treatment, we encourage you to take your nausea medication as directed.  BELOW ARE SYMPTOMS THAT SHOULD BE REPORTED IMMEDIATELY: *FEVER GREATER THAN 100.4 F (38 C) OR HIGHER *CHILLS OR SWEATING *NAUSEA AND VOMITING THAT IS NOT CONTROLLED WITH YOUR NAUSEA MEDICATION *UNUSUAL SHORTNESS OF BREATH *UNUSUAL BRUISING OR BLEEDING *URINARY PROBLEMS (pain or burning when urinating, or frequent urination) *BOWEL PROBLEMS (unusual diarrhea, constipation, pain near the anus) TENDERNESS IN MOUTH AND THROAT WITH OR WITHOUT PRESENCE OF ULCERS (sore throat, sores in mouth, or a toothache) UNUSUAL RASH, SWELLING OR PAIN  UNUSUAL VAGINAL DISCHARGE OR ITCHING   Items with * indicate a potential emergency and should be followed up as soon as possible or go to the Emergency Department if any problems should occur.  Please show the CHEMOTHERAPY ALERT CARD or IMMUNOTHERAPY ALERT CARD at check-in to the  Emergency Department and triage nurse.  Should you have questions after your visit or need to cancel or reschedule your appointment, please contact [facility] CANCER [place] AT [place]  [phone] and follow the prompts.  Office hours are 8:00 a.m. to 4:30 p.m. Monday - Friday. Please note that voicemails left after 4:00 p.m. may not be returned until the following business day.  We are closed weekends and major holidays. You have access to a nurse at all times for urgent questions. Please call the main number to the clinic [phone] and follow the prompts.  For any non-urgent questions, you may also contact your provider using MyChart. We now offer e-Visits for anyone 38 and older to request care online for non-urgent symptoms. For details visit [email or web address].   Also download the MyChart app! Go to the app store, search "MyChart", open the app, select [facility], and log in with your MyChart username and password.  Due to Covid, a mask is required upon entering the hospital/clinic. If you do not have a mask, one will be given to you upon arrival. For doctor visits, patients may have 1 support person aged 28 or older with them. For treatment visits, patients cannot have anyone with them due to current Covid guidelines and our immunocompromised population.

## 2021-11-13 NOTE — Progress Notes (Signed)
[facility] NOTE  Patient Care Team: Baxter Hire, MD as PCP - General (Internal Medicine) [name], RN as Oncology Nurse Navigator [name], MD as Consulting Physician (Hematology and Oncology)  CHIEF COMPLAINTS/PURPOSE OF CONSULTATION: Lung cancer  #  Oncology History Overview Note  # NOV 2021- LEFT LOWER LOBE NON-SMALL CELL CA [favor adeno ca]; T2N3 [right hilar; subcarinal LN;[name]; NOV 2021-MRI [place]  # 11/30- carbo-Taxol-RT [RT until 10/23/20]  # Right lower lobe - ~4.6 x 3.1 cm  [PET 07-2020]- demonstrates no significant hypermetabolic activity (SUV max 1.8]-benign. STABLE.   # A.fib [Eliquis; Dr.Kowalski]  # # SURVIVORSHIP:   # GENETICS:   #NGS-ordered  DIAGNOSIS: Lung cancer  STAGE:   III      ;  GOALS:  cure  CURRENT/MOST RECENT THERAPY : carbo-Taxol-RT ]   Cancer of lower lobe of left lung ([name])  08/14/2020 Initial Diagnosis   Cancer of lower lobe of left lung ([place])   09/02/2020 - 10/22/2020 Chemotherapy    Patient is on Treatment Plan: LUNG DURVALUMAB Q14D       10/16/2020 Cancer Staging   Staging form: Lung, AJCC 8th Edition - Clinical: Stage IIIB (cT2a, cN3, cM0) - Signed by [name], MD on 10/16/2020    12/17/2020 -  Chemotherapy   Patient is on Treatment Plan : LUNG Durvalumab q14d      HISTORY OF PRESENTING ILLNESS: Walking independently but with a limp because of left hip arthritis.  He is alone.  Jason Warren 88 y.o.  male patient with stage III lung non-small cell lung-currently adjuvant duvalumab is here for follow-up/review results of the CT scan.   Denies any new shortness of breath or cough.  Denies any swelling in the legs.  Continues to wear compression stockings.    Review of Systems  Constitutional:  Negative for chills, diaphoresis, fever, malaise/fatigue and weight loss.  HENT:  Negative for nosebleeds and sore throat.   Eyes:  Negative for double vision.   Respiratory:  Negative for hemoptysis, sputum production, shortness of breath and wheezing.   Cardiovascular:  Negative for chest pain, palpitations, orthopnea and leg swelling.  Gastrointestinal:  Negative for abdominal pain, blood in stool, constipation, diarrhea, heartburn, melena, nausea and vomiting.  Genitourinary:  Negative for dysuria, frequency and urgency.  Musculoskeletal:  Negative for back pain and joint pain.  Skin:  Negative for itching.  Neurological:  Negative for tingling, focal weakness, weakness and headaches.  Endo/Heme/Allergies:  Does not bruise/bleed easily.  Psychiatric/Behavioral:  Negative for depression. The patient is not nervous/anxious and does not have insomnia.     MEDICAL HISTORY:  Past Medical History:  Diagnosis Date   Arthritis    Dysrhythmia    A-fib   Hip pain    left   HOH (hard of hearing)    LBBB (left bundle branch block) 08/05/2020   Lung cancer ([place])    Skin cancer, basal cell     face top of head    SURGICAL HISTORY: Past Surgical History:  Procedure Laterality Date   CATARACT EXTRACTION W/PHACO Right 07/13/2017   Procedure: CATARACT EXTRACTION PHACO AND INTRAOCULAR LENS PLACEMENT ([place]);  Surgeon: Leandrew Koyanagi, MD;  Location: [facility];  Service: Ophthalmology;  Laterality: Right;  IVA TOPICAL RIGHT   CATARACT EXTRACTION W/PHACO Left 01/04/2018   Procedure: CATARACT EXTRACTION PHACO AND INTRAOCULAR LENS PLACEMENT ([place]) LEFT;  Surgeon: Leandrew Koyanagi, MD;  Location: [facility];  Service: Ophthalmology;  Laterality: Left;  COLONOSCOPY     IR IMAGING GUIDED PORT INSERTION  08/27/2020   JOINT REPLACEMENT     Left total hip Dr. Su Hoff 08-04-18   ROTATOR CUFF REPAIR Right    SKIN CANCER EXCISION     face   TOTAL HIP ARTHROPLASTY Left 08/04/2018   Procedure: TOTAL HIP ARTHROPLASTY ANTERIOR APPROACH;  Surgeon: Frederik Pear, MD;  Location: [facility];  Service: Orthopedics;  Laterality: Left;   VIDEO  BRONCHOSCOPY WITH ENDOBRONCHIAL NAVIGATION N/A 08/07/2020   Procedure: VIDEO BRONCHOSCOPY WITH ENDOBRONCHIAL NAVIGATION;  Surgeon: Ottie Glazier, MD;  Location: [facility];  Service: Thoracic;  Laterality: N/A;   VIDEO BRONCHOSCOPY WITH ENDOBRONCHIAL ULTRASOUND N/A 08/07/2020   Procedure: VIDEO BRONCHOSCOPY WITH ENDOBRONCHIAL ULTRASOUND;  Surgeon: Ottie Glazier, MD;  Location: [facility];  Service: Thoracic;  Laterality: N/A;    SOCIAL HISTORY: Social History   Socioeconomic History   Marital status: Married    Spouse name: Not on file   Number of children: Not on file   Years of education: Not on file   Highest education level: Not on file  Occupational History   Not on file  Tobacco Use   Smoking status: Former    Packs/day: 1.00    Years: 4.00    Pack years: 4.00    Types: Cigarettes   Smokeless tobacco: Never   Tobacco comments:    quit early 70's  Vaping Use   Vaping Use: Never used  Substance and Sexual Activity   Alcohol use: No   Drug use: No   Sexual activity: Not Currently  Other Topics Concern   Not on file  Social History Narrative   > quit 35 years; smoked for 15 years. Rare alcohol. 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    Social Determinants of Health   Financial Resource Strain: Not on file  Food Insecurity: Not on file  Transportation Needs: Not on file  Physical Activity: Not on file  Stress: Not on file  Social Connections: Not on file  Intimate Partner Violence: Not on file    FAMILY HISTORY: Family History  Problem Relation Age of Onset   Throat cancer Brother         & lung cancer    ALLERGIES:  has No Known Allergies.  MEDICATIONS:  Current Outpatient Medications  Medication Sig Dispense Refill   acetaminophen (TYLENOL) 500 MG tablet Take 500 mg by mouth every 8 (eight) hours as needed for moderate pain.     apixaban (ELIQUIS) 5 MG TABS tablet Take 5 mg by mouth 2 (two)  times daily.     hydroxypropyl methylcellulose / hypromellose (ISOPTO TEARS / GONIOVISC) 2.5 % ophthalmic solution Place 1 drop into the left eye daily as needed for dry eyes.     lidocaine-prilocaine (EMLA) cream Apply 1 application topically as needed. Apply small amount to port site at least 1 hour prior to it being accessed, cover with plastic wrap 30 g 1   Multiple Vitamin (MULTIVITAMIN) tablet Take 1 tablet by mouth daily.     pravastatin (PRAVACHOL) 80 MG tablet Take 80 mg by mouth at bedtime.      saw palmetto 160 MG capsule Take 160 mg by mouth 2 (two) times daily.     No current facility-administered medications for this visit.   Facility-Administered Medications Ordered in Other Visits  Medication Dose Route Frequency Provider Last Rate Last Admin   heparin lock flush 100  UNIT/ML injection            sodium chloride flush (NS) 0.9 % injection 10 mL  10 mL Intravenous PRN Charlaine Dalton R, MD   10 mL at 02/11/21 0835   sodium chloride flush (NS) 0.9 % injection 10 mL  10 mL Intracatheter PRN [name], MD       PHYSICAL EXAMINATION: ECOG PERFORMANCE STATUS: 1 - Symptomatic but completely ambulatory  Vitals:   11/13/21 0900  BP: 134/69  Pulse: 62  Resp: 18  Temp: (!) 97 F (36.1 C)  SpO2: 98%   Filed Weights   11/13/21 0900  Weight: 163 lb 12.8 oz (74.3 kg)    Physical Exam HENT:     Head: Normocephalic and atraumatic.     Mouth/Throat:     Pharynx: No oropharyngeal exudate.  Eyes:     Pupils: Pupils are equal, round, and reactive to light.  Cardiovascular:     Rate and Rhythm: Normal rate and regular rhythm.  Pulmonary:     Effort: Pulmonary effort is normal. No respiratory distress.     Breath sounds: No wheezing.     Comments: Slightly decreased breath sounds left lower lung base. Abdominal:     General: Bowel sounds are normal. There is no distension.     Palpations: Abdomen is soft. There is no mass.     Tenderness: There is no abdominal  tenderness. There is no guarding or rebound.  Musculoskeletal:        General: No tenderness. Normal range of motion.     Cervical back: Normal range of motion and neck supple.  Skin:    General: Skin is warm.  Neurological:     Mental Status: He is alert and oriented to person, place, and time.  Psychiatric:        Mood and Affect: Affect normal.     LABORATORY DATA:  I have reviewed the data as listed Lab Results  Component Value Date   WBC 6.3 11/13/2021   HGB 13.5 11/13/2021   HCT 40.5 11/13/2021   MCV 96.9 11/13/2021   PLT 244 11/13/2021   Recent Labs    10/15/21 0911 10/30/21 0829 11/13/21 0830  NA 136 136 133*  K 4.3 4.3 4.1  CL 104 102 103  CO2 _0 GLUCOSE 115* 120* 140*  BUN _1 CREATININE 0.78 0.90 0.90  CALCIUM 8.8* 9.3 8.5*  GFRNONAA >60 >60 >60  PROT 7.2 7.4 7.0  ALBUMIN 4.1 4.1 3.8  AST 61* 32 24  ALT _2 ALKPHOS 120 128* 116  BILITOT 1.0 0.9 0.8    RADIOGRAPHIC STUDIES: I have personally reviewed the radiological images as listed and agreed with the findings in the report. CT CHEST WO CONTRAST  Result Date: 11/04/2021 CLINICAL DATA:  Non-small cell lung cancer, assess treatment response EXAM: CT CHEST WITHOUT CONTRAST TECHNIQUE: Multidetector CT imaging of the chest was performed following the standard protocol without IV contrast. RADIATION DOSE REDUCTION: This exam was performed according to the departmental dose-optimization program which includes automated exposure control, adjustment of the mA and/or kV according to patient size and/or use of iterative reconstruction technique. COMPARISON:  Multiple prior exams, most recent dated August 06, 2021 FINDINGS: Cardiovascular: Mild cardiomegaly. Trace pericardial effusion left main and RCA coronary artery calcifications. Atherosclerotic disease of the thoracic aorta. Right chest wall port with tip near the superior cavoatrial junction. Mediastinum/Nodes: Esophagus and thyroid are  unremarkable. No pathologically enlarged  lymph nodes seen in the chest. Esophagus and thyroid are unremarkable. Lungs/Pleura: Left paramediastinal post radiation changes are stable when compared with prior exam. Right lower lobe bronchiectasis and linear consolidations with adjacent ground-glass component. Stable small left pleural effusion. Interval resolution of right middle lobe pulmonary nodule with surrounding ground-glass, compatible with resolved infectious or inflammatory process. Additional previously seen pulmonary nodules are stable. Reference stable small solid pulmonary nodule of the right middle lobe measuring 3 mm on series 3, image 97. Upper Abdomen: Simple cyst of the left kidney. No acute abnormality. Musculoskeletal: Unchanged T12 and L1 compression deformities and remote sternal fracture. No acute osseous abnormality. IMPRESSION: 1. Stable left perihilar post radiation changes with no evidence of recurrent disease. 2. Stable right lower lobe bronchiectasis and linear consolidations which may be treatment related or sequela of prior infection. Adjacent ground-glass component is unchanged when compared with multiple prior exams dating back to July 07, 2020 and concerning for indolent adenocarcinoma. Recommend attention on follow-up. 3. No evidence of metastatic disease in the chest. 4. Medial right middle lobe pulmonary nodule is no longer present, compatible with resolved infectious or inflammatory process. Additional previously seen pulmonary nodules are stable. 5. Unchanged small left pleural effusion. Electronically Signed   By: Yetta Glassman M.D.   On: 11/04/2021 14:47     ASSESSMENT & PLAN:   Cancer of lower lobe of left lung ([place]) # T2N3- Stage III-non-small cell lung cancer-[favor adenocarcinoma]- s/p  concurrent chemoradiation; Finished Feb 2022]; currently on adjuvant durvalumab. CT scan-FEB 1st, 2023--continued left lower lobe architectural distortion/fibrosis without any  evidence of recurrent disease.  Small stable left-sided pleural effusion  right lower lobe peribronchial architectural distortion [chronic unrelated to malignancy/treatment]; right middle lobe lung nodule-no longer visible.    # proceed with IMFINZI [until the end of February 2023-last] Labs today reviewed;  acceptable for treatment today. DEC 2022-TSH-WNL.    #CT scan-August 10, 2021- medial right middle lobe 3 mm nodule is new since the prior exam-monitor for now- resolved on CT FEB 2023.   # Mild intermittent elevation of of Alk phos-? From Imfinzi- monitor for now- Stable.  # left-sided pleural effusion-CT November 2022. Hold off any thoracentesis- STABLE.   # A. fib on Eliquis [Dr.Kowalski]-  Mild swelling in legs- EF in Feb 2022- 55-60%; continue BIL compression stocking/leg elevation- STABLE.   #Prognosis: I had a long discussion with the patient regarding the overall excellent response to therapy/tolerance to therapy especially given his age.  Discussed that the average time about 1 to 2 years; the median survival is about again 3-4 years.  However he understands that the above numbers do not belong to him as they are for the average population.  # DISPOSITION: # proceed with  IMFINZI today; # follow up in 2 weeks-MD labs- cbc/cmp;;IMFINZI -;Dr.B  # I reviewed the blood work- with the patient in detail; also reviewed the imaging independently [as summarized above]; and with the patient in detail.      All questions were answered. The patient knows to call the clinic with any problems, questions or concerns.   [name], MD 11/13/2021 1:55 PM

## 2021-11-13 NOTE — Assessment & Plan Note (Addendum)
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 CT scan-FEB 1st, 2023--continued left lower lobe architectural distortion/fibrosis without any evidence of recurrent disease.  Small stable left-sided pleural effusion  right lower lobe peribronchial architectural distortion [chronic unrelated to malignancy/treatment]; right middle lobe lung nodule-no longer visible.    # proceed with IMFINZI [until the end of February 2023-last] Labs today reviewed;  acceptable for treatment today. DEC 2022-TSH-WNL.    #CT scan-August 10, 2021- medial right middle lobe 3 mm nodule is new since the prior exam-monitor for now- resolved on CT FEB 2023.   # Mild intermittent elevation of of Alk phos-? From Imfinzi- monitor for now- Stable.  # left-sided pleural effusion-CT November 2022. Hold off any thoracentesis- STABLE.   # A. fib on Eliquis [Dr.Kowalski]-  Mild swelling in legs- EF in Feb 2022- 55-60%; continue BIL compression stocking/leg elevation- STABLE.   #Prognosis: I had a long discussion with the patient regarding the overall excellent response to therapy/tolerance to therapy especially given his age.  Discussed that the average time about 1 to 2 years; the median survival is about again 3-4 years.  However he understands that the above numbers do not belong to him as they are for the average population.  # DISPOSITION: # proceed with  IMFINZI today; # follow up in 2 weeks-MD labs- cbc/cmp;;IMFINZI -;Dr.B  # I reviewed the blood work- with the patient in detail; also reviewed the imaging independently [as summarized above]; and with the patient in detail.

## 2021-11-13 NOTE — Progress Notes (Signed)
Patient denies new problems/concerns today.   °

## 2021-11-27 ENCOUNTER — Inpatient Hospital Stay (HOSPITAL_BASED_OUTPATIENT_CLINIC_OR_DEPARTMENT_OTHER): Payer: Medicare Other | Admitting: Internal Medicine

## 2021-11-27 ENCOUNTER — Encounter: Payer: Self-pay | Admitting: Internal Medicine

## 2021-11-27 ENCOUNTER — Inpatient Hospital Stay: Payer: Medicare Other

## 2021-11-27 ENCOUNTER — Other Ambulatory Visit: Payer: Self-pay

## 2021-11-27 DIAGNOSIS — C3432 Malignant neoplasm of lower lobe, left bronchus or lung: Secondary | ICD-10-CM | POA: Diagnosis not present

## 2021-11-27 DIAGNOSIS — R5383 Other fatigue: Secondary | ICD-10-CM | POA: Diagnosis not present

## 2021-11-27 DIAGNOSIS — Z5112 Encounter for antineoplastic immunotherapy: Secondary | ICD-10-CM | POA: Diagnosis not present

## 2021-11-27 LAB — CBC WITH DIFFERENTIAL/PLATELET
Abs Immature Granulocytes: 0.06 10*3/uL (ref 0.00–0.07)
Basophils Absolute: 0 10*3/uL (ref 0.0–0.1)
Basophils Relative: 0 %
Eosinophils Absolute: 0.1 10*3/uL (ref 0.0–0.5)
Eosinophils Relative: 2 %
HCT: 41.7 % (ref 39.0–52.0)
Hemoglobin: 13.9 g/dL (ref 13.0–17.0)
Immature Granulocytes: 1 %
Lymphocytes Relative: 24 %
Lymphs Abs: 1.4 10*3/uL (ref 0.7–4.0)
MCH: 32 pg (ref 26.0–34.0)
MCHC: 33.3 g/dL (ref 30.0–36.0)
MCV: 96.1 fL (ref 80.0–100.0)
Monocytes Absolute: 0.6 10*3/uL (ref 0.1–1.0)
Monocytes Relative: 10 %
Neutro Abs: 3.7 10*3/uL (ref 1.7–7.7)
Neutrophils Relative %: 63 %
Platelets: 232 10*3/uL (ref 150–400)
RBC: 4.34 MIL/uL (ref 4.22–5.81)
RDW: 13 % (ref 11.5–15.5)
WBC: 5.9 10*3/uL (ref 4.0–10.5)
nRBC: 0 % (ref 0.0–0.2)

## 2021-11-27 LAB — COMPREHENSIVE METABOLIC PANEL
ALT: 21 U/L (ref 0–44)
AST: 32 U/L (ref 15–41)
Albumin: 4.1 g/dL (ref 3.5–5.0)
Alkaline Phosphatase: 122 U/L (ref 38–126)
Anion gap: 6 (ref 5–15)
BUN: 19 mg/dL (ref 8–23)
CO2: 25 mmol/L (ref 22–32)
Calcium: 9 mg/dL (ref 8.9–10.3)
Chloride: 104 mmol/L (ref 98–111)
Creatinine, Ser: 0.99 mg/dL (ref 0.61–1.24)
GFR, Estimated: >60 mL/min (ref >60)
Glucose, Bld: 149 mg/dL — ABNORMAL HIGH (ref 70–99)
Potassium: 4.3 mmol/L (ref 3.5–5.1)
Sodium: 135 mmol/L (ref 135–145)
Total Bilirubin: 0.4 mg/dL (ref 0.3–1.2)
Total Protein: 7.4 g/dL (ref 6.5–8.1)

## 2021-11-27 MED ORDER — SODIUM CHLORIDE 0.9 % IV SOLN
10.0000 mg/kg | Freq: Once | INTRAVENOUS | Status: AC
  Administered 2021-11-27: 740 mg via INTRAVENOUS
  Filled 2021-11-27: qty 10

## 2021-11-27 MED ORDER — HEPARIN SOD (PORK) LOCK FLUSH 100 UNIT/ML IV SOLN
500.0000 [IU] | Freq: Once | INTRAVENOUS | Status: AC | PRN
  Filled 2021-11-27: qty 5

## 2021-11-27 MED ORDER — SODIUM CHLORIDE 0.9% FLUSH
10.0000 mL | INTRAVENOUS | Status: DC | PRN
  Administered 2021-11-27: 10 mL
  Filled 2021-11-27: qty 10

## 2021-11-27 MED ORDER — HEPARIN SOD (PORK) LOCK FLUSH 100 UNIT/ML IV SOLN
INTRAVENOUS | Status: AC
  Administered 2021-11-27: 500 [IU]
  Filled 2021-11-27: qty 5

## 2021-11-27 MED ORDER — SODIUM CHLORIDE 0.9 % IV SOLN
Freq: Once | INTRAVENOUS | Status: AC
  Filled 2021-11-27: qty 250

## 2021-11-27 NOTE — Progress Notes (Signed)
Patient denies new problems/concerns today.   °

## 2021-11-27 NOTE — Assessment & Plan Note (Addendum)
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 CT scan-FEB 1st, 2023--continued left lower lobe architectural distortion/fibrosis without any evidence of recurrent disease.  Small stable left-sided pleural effusion  right lower lobe peribronchial architectural distortion [chronic unrelated to malignancy/treatment]; right middle lobe lung nodule-no longer visible.   # proceed with LAST IMFINZI.  Labs today reviewed;  acceptable for treatment today. JAN 2023-TSH-WNL.  We will repeat imaging again in 3 months.  Ordered today.  #Intermittent difficulty swallowing question related to radiation-patient/wife declined any GI evaluation.  Recommend informing us if getting worse.  Discussed that he might need EGD/dilatation.  # Mild intermittent elevation of of Alk phos-? From Imfinzi- monitor for now- STABLE.   # left-sided pleural effusion-CT November 2022. Hold off any thoracentesis-STABLE.   # A. fib on Eliquis [Dr.Kowalski]-  Mild swelling in legs- EF in Feb 2022- 55-60%; continue BIL compression stocking/leg elevation-   # MediPort flush q 6 W-stable.  # DISPOSITION: # proceed with  IMFINZI today; #port flush in 6 W # follow up in 3 months-MD labs- cbc/cmp;TSH- CT chest prior--;Dr.B

## 2021-11-27 NOTE — Progress Notes (Signed)
[facility] NOTE  Patient Care Team: Baxter Hire, MD as PCP - General (Internal Medicine) [name], RN as Oncology Nurse Navigator [name], MD as Consulting Physician (Hematology and Oncology)  CHIEF COMPLAINTS/PURPOSE OF CONSULTATION: Lung cancer  #  Oncology History Overview Note  # NOV 2021- LEFT LOWER LOBE NON-SMALL CELL CA [favor adeno ca]; T2N3 [right hilar; subcarinal LN;[name]; NOV 2021-MRI [facility]  # 11/30- carbo-Taxol-RT [RT until 10/23/20]; s/p IMFINZI on 2/24. [33 months]  # Right lower lobe - ~4.6 x 3.1 cm  [PET 07-2020]- demonstrates no significant hypermetabolic activity (SUV max 1.8]-benign. STABLE.   # A.fib [Eliquis; Dr.Kowalski]  # # SURVIVORSHIP:   # GENETICS:   #NGS-ordered  DIAGNOSIS: Lung cancer  STAGE:   III      ;  GOALS:  cure  CURRENT/MOST RECENT THERAPY : carbo-Taxol-RT ]   Cancer of lower lobe of left lung ([place])  08/14/2020 Initial Diagnosis   Cancer of lower lobe of left lung ([place])   09/02/2020 - 10/22/2020 Chemotherapy    Patient is on Treatment Plan: LUNG DURVALUMAB Q14D       10/16/2020 Cancer Staging   Staging form: Lung, AJCC 8th Edition - Clinical: Stage IIIB (cT2a, cN3, cM0) - Signed by [name], MD on 10/16/2020    12/17/2020 -  Chemotherapy   Patient is on Treatment Plan : LUNG Durvalumab q14d      HISTORY OF PRESENTING ILLNESS: Walking independently but with a limp because of left hip arthritis.  He is accompanied by his wife.  [name] 88 y.o.  male patient with stage III lung non-small cell lung-currently adjuvant duvalumab is here for follow-up.  Denies any shortness of breath.  Intermittent cough not any worse.  Intermittent difficulty swallowing-not any worse.  Denies any swelling in the legs.  Continues to wear compression stockings.    Review of Systems  Constitutional:  Negative for chills, diaphoresis, fever, malaise/fatigue and weight loss.   HENT:  Negative for nosebleeds and sore throat.   Eyes:  Negative for double vision.  Respiratory:  Negative for hemoptysis, sputum production, shortness of breath and wheezing.   Cardiovascular:  Negative for chest pain, palpitations, orthopnea and leg swelling.  Gastrointestinal:  Negative for abdominal pain, blood in stool, constipation, diarrhea, heartburn, melena, nausea and vomiting.  Genitourinary:  Negative for dysuria, frequency and urgency.  Musculoskeletal:  Negative for back pain and joint pain.  Skin:  Negative for itching.  Neurological:  Negative for tingling, focal weakness, weakness and headaches.  Endo/Heme/Allergies:  Does not bruise/bleed easily.  Psychiatric/Behavioral:  Negative for depression. The patient is not nervous/anxious and does not have insomnia.     MEDICAL HISTORY:  Past Medical History:  Diagnosis Date   Arthritis    Dysrhythmia    A-fib   Hip pain    left   HOH (hard of hearing)    LBBB (left bundle branch block) 08/05/2020   Lung cancer ([place])    Skin cancer, basal cell     face top of head    SURGICAL HISTORY: Past Surgical History:  Procedure Laterality Date   CATARACT EXTRACTION W/PHACO Right 07/13/2017   Procedure: CATARACT EXTRACTION PHACO AND INTRAOCULAR LENS PLACEMENT ([place]);  Surgeon: Leandrew Koyanagi, MD;  Location: [facility];  Service: Ophthalmology;  Laterality: Right;  IVA TOPICAL RIGHT   CATARACT EXTRACTION W/PHACO Left 01/04/2018   Procedure: CATARACT EXTRACTION PHACO AND INTRAOCULAR LENS PLACEMENT ([place]) LEFT;  Surgeon: Leandrew Koyanagi, MD;  Location: [facility];  Service: Ophthalmology;  Laterality: Left;   COLONOSCOPY     IR IMAGING GUIDED PORT INSERTION  08/27/2020   JOINT REPLACEMENT     Left total hip Dr. Su Hoff 08-04-18   ROTATOR CUFF REPAIR Right    SKIN CANCER EXCISION     face   TOTAL HIP ARTHROPLASTY Left 08/04/2018   Procedure: TOTAL HIP ARTHROPLASTY ANTERIOR APPROACH;  Surgeon:  Frederik Pear, MD;  Location: [facility];  Service: Orthopedics;  Laterality: Left;   VIDEO BRONCHOSCOPY WITH ENDOBRONCHIAL NAVIGATION N/A 08/07/2020   Procedure: VIDEO BRONCHOSCOPY WITH ENDOBRONCHIAL NAVIGATION;  Surgeon: Ottie Glazier, MD;  Location: [facility];  Service: Thoracic;  Laterality: N/A;   VIDEO BRONCHOSCOPY WITH ENDOBRONCHIAL ULTRASOUND N/A 08/07/2020   Procedure: VIDEO BRONCHOSCOPY WITH ENDOBRONCHIAL ULTRASOUND;  Surgeon: Ottie Glazier, MD;  Location: [facility];  Service: Thoracic;  Laterality: N/A;    SOCIAL HISTORY: Social History   Socioeconomic History   Marital status: Married    Spouse name: Not on file   Number of children: Not on file   Years of education: Not on file   Highest education level: Not on file  Occupational History   Not on file  Tobacco Use   Smoking status: Former    Packs/day: 1.00    Years: 4.00    Pack years: 4.00    Types: Cigarettes   Smokeless tobacco: Never   Tobacco comments:    quit early 70's  Vaping Use   Vaping Use: Never used  Substance and Sexual Activity   Alcohol use: No   Drug use: No   Sexual activity: Not Currently  Other Topics Concern   Not on file  Social History Narrative   > quit 35 years; smoked for 15 years. Rare alcohol. 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    Social Determinants of Health   Financial Resource Strain: Not on file  Food Insecurity: Not on file  Transportation Needs: Not on file  Physical Activity: Not on file  Stress: Not on file  Social Connections: Not on file  Intimate Partner Violence: Not on file    FAMILY HISTORY: Family History  Problem Relation Age of Onset   Throat cancer Brother         & lung cancer    ALLERGIES:  has No Known Allergies.  MEDICATIONS:  Current Outpatient Medications  Medication Sig Dispense Refill   acetaminophen (TYLENOL) 500 MG tablet Take 500 mg by mouth every 8 (eight) hours as needed  for moderate pain.     apixaban (ELIQUIS) 5 MG TABS tablet Take 5 mg by mouth 2 (two) times daily.     hydroxypropyl methylcellulose / hypromellose (ISOPTO TEARS / GONIOVISC) 2.5 % ophthalmic solution Place 1 drop into the left eye daily as needed for dry eyes.     lidocaine-prilocaine (EMLA) cream Apply 1 application topically as needed. Apply small amount to port site at least 1 hour prior to it being accessed, cover with plastic wrap 30 g 1   Multiple Vitamin (MULTIVITAMIN) tablet Take 1 tablet by mouth daily.     pravastatin (PRAVACHOL) 80 MG tablet Take 80 mg by mouth at bedtime.      saw palmetto 160 MG capsule Take 160 mg by mouth 2 (two) times daily.     No current facility-administered medications for this visit.   Facility-Administered Medications Ordered in Other Visits  Medication Dose Route  Frequency Provider Last Rate Last Admin   durvalumab (IMFINZI) 740 mg in sodium chloride 0.9 % 100 mL chemo infusion  10 mg/kg (Order-Specific) Intravenous Once [name], MD 115 mL/hr at 11/27/21 0944 740 mg at 11/27/21 0944   heparin lock flush 100 UNIT/ML injection            heparin lock flush 100 UNIT/ML injection            heparin lock flush 100 unit/mL  500 Units Intracatheter Once PRN [name], MD       sodium chloride flush (NS) 0.9 % injection 10 mL  10 mL Intravenous PRN Charlaine Dalton R, MD   10 mL at 02/11/21 0835   sodium chloride flush (NS) 0.9 % injection 10 mL  10 mL Intracatheter PRN [name], MD   10 mL at 11/27/21 0913   PHYSICAL EXAMINATION: ECOG PERFORMANCE STATUS: 1 - Symptomatic but completely ambulatory  Vitals:   11/27/21 0800  BP: 131/73  Pulse: 68  Resp: 18  Temp: 97.7 F (36.5 C)  SpO2: 98%   Filed Weights   11/27/21 0800  Weight: 162 lb 9.6 oz (73.8 kg)    Physical Exam HENT:     Head: Normocephalic and atraumatic.     Mouth/Throat:     Pharynx: No oropharyngeal exudate.  Eyes:     Pupils: Pupils are  equal, round, and reactive to light.  Cardiovascular:     Rate and Rhythm: Normal rate and regular rhythm.  Pulmonary:     Effort: Pulmonary effort is normal. No respiratory distress.     Breath sounds: No wheezing.     Comments: Slightly decreased breath sounds left lower lung base. Abdominal:     General: Bowel sounds are normal. There is no distension.     Palpations: Abdomen is soft. There is no mass.     Tenderness: There is no abdominal tenderness. There is no guarding or rebound.  Musculoskeletal:        General: No tenderness. Normal range of motion.     Cervical back: Normal range of motion and neck supple.  Skin:    General: Skin is warm.  Neurological:     Mental Status: He is alert and oriented to person, place, and time.  Psychiatric:        Mood and Affect: Affect normal.     LABORATORY DATA:  I have reviewed the data as listed Lab Results  Component Value Date   WBC 5.9 11/27/2021   HGB 13.9 11/27/2021   HCT 41.7 11/27/2021   MCV 96.1 11/27/2021   PLT 232 11/27/2021   Recent Labs    10/30/21 0829 11/13/21 0830 11/27/21 0752  NA 136 133* 135  K 4.3 4.1 4.3  CL 102 103 104  CO2 _0 GLUCOSE 120* 140* 149*  BUN _1 CREATININE 0.90 0.90 0.99  CALCIUM 9.3 8.5* 9.0  GFRNONAA >60 >60 >60  PROT 7.4 7.0 7.4  ALBUMIN 4.1 3.8 4.1  AST 32 24 32  ALT _2 ALKPHOS 128* 116 122  BILITOT 0.9 0.8 0.4    RADIOGRAPHIC STUDIES: I have personally reviewed the radiological images as listed and agreed with the findings in the report. CT CHEST WO CONTRAST  Result Date: 11/04/2021 CLINICAL DATA:  Non-small cell lung cancer, assess treatment response EXAM: CT CHEST WITHOUT CONTRAST TECHNIQUE: Multidetector CT imaging of the chest was performed following the standard protocol without IV  contrast. RADIATION DOSE REDUCTION: This exam was performed according to the departmental dose-optimization program which includes automated exposure control, adjustment  of the mA and/or kV according to patient size and/or use of iterative reconstruction technique. COMPARISON:  Multiple prior exams, most recent dated August 06, 2021 FINDINGS: Cardiovascular: Mild cardiomegaly. Trace pericardial effusion left main and RCA coronary artery calcifications. Atherosclerotic disease of the thoracic aorta. Right chest wall port with tip near the superior cavoatrial junction. Mediastinum/Nodes: Esophagus and thyroid are unremarkable. No pathologically enlarged lymph nodes seen in the chest. Esophagus and thyroid are unremarkable. Lungs/Pleura: Left paramediastinal post radiation changes are stable when compared with prior exam. Right lower lobe bronchiectasis and linear consolidations with adjacent ground-glass component. Stable small left pleural effusion. Interval resolution of right middle lobe pulmonary nodule with surrounding ground-glass, compatible with resolved infectious or inflammatory process. Additional previously seen pulmonary nodules are stable. Reference stable small solid pulmonary nodule of the right middle lobe measuring 3 mm on series 3, image 97. Upper Abdomen: Simple cyst of the left kidney. No acute abnormality. Musculoskeletal: Unchanged T12 and L1 compression deformities and remote sternal fracture. No acute osseous abnormality. IMPRESSION: 1. Stable left perihilar post radiation changes with no evidence of recurrent disease. 2. Stable right lower lobe bronchiectasis and linear consolidations which may be treatment related or sequela of prior infection. Adjacent ground-glass component is unchanged when compared with multiple prior exams dating back to July 07, 2020 and concerning for indolent adenocarcinoma. Recommend attention on follow-up. 3. No evidence of metastatic disease in the chest. 4. Medial right middle lobe pulmonary nodule is no longer present, compatible with resolved infectious or inflammatory process. Additional previously seen pulmonary nodules  are stable. 5. Unchanged small left pleural effusion. Electronically Signed   By: Yetta Glassman M.D.   On: 11/04/2021 14:47     ASSESSMENT & PLAN:   Cancer of lower lobe of left lung ([place]) # T2N3- Stage III-non-small cell lung cancer-[favor adenocarcinoma]- s/p  concurrent chemoradiation; Finished Feb 2022]; currently on adjuvant durvalumab. CT scan-FEB 1st, 2023--continued left lower lobe architectural distortion/fibrosis without any evidence of recurrent disease.  Small stable left-sided pleural effusion  right lower lobe peribronchial architectural distortion [chronic unrelated to malignancy/treatment]; right middle lobe lung nodule-no longer visible.   # proceed with LAST IMFINZI.  Labs today reviewed;  acceptable for treatment today. JAN 2023-TSH-WNL.  We will repeat imaging again in 3 months.  Ordered today.  #Intermittent difficulty swallowing question related to radiation-patient/wife declined any GI evaluation.  Recommend informing us if getting worse.  Discussed that he might need EGD/dilatation.  # Mild intermittent elevation of of Alk phos-? From Imfinzi- monitor for now- STABLE.   # left-sided pleural effusion-CT November 2022. Hold off any thoracentesis-STABLE.   # A. fib on Eliquis [Dr.Kowalski]-  Mild swelling in legs- EF in Feb 2022- 55-60%; continue BIL compression stocking/leg elevation-   # MediPort flush q 6 W-stable.  # DISPOSITION: # proceed with  IMFINZI today; #port flush in 6 W # follow up in 3 months-MD labs- cbc/cmp;TSH- CT chest prior--;Dr.B     All questions were answered. The patient knows to call the clinic with any problems, questions or concerns.   [name], MD 11/27/2021 10:21 AM

## 2021-11-27 NOTE — Patient Instructions (Signed)
[facility] [place]   Discharge Instructions: Thank you for choosing [facility] to provide your oncology and hematology care.  If you have a lab appointment with the [facility], please go directly to the [facility] and check in at the registration area.   Wear comfortable clothing and clothing appropriate for easy access to any Portacath or PICC line.   We strive to give you quality time with your provider. You may need to reschedule your appointment if you arrive late (15 or more minutes).  Arriving late affects you and other patients whose appointments are after yours.  Also, if you miss three or more appointments without notifying the office, you may be dismissed from the clinic at the providers discretion.      For prescription refill requests, have your pharmacy contact our office and allow 72 hours for refills to be completed.    Today you received the following chemotherapy and/or immunotherapy agents: Imfinzi.      To help prevent nausea and vomiting after your treatment, we encourage you to take your nausea medication as directed.  BELOW ARE SYMPTOMS THAT SHOULD BE REPORTED IMMEDIATELY: *FEVER GREATER THAN 100.4 F (38 C) OR HIGHER *CHILLS OR SWEATING *NAUSEA AND VOMITING THAT IS NOT CONTROLLED WITH YOUR NAUSEA MEDICATION *UNUSUAL SHORTNESS OF BREATH *UNUSUAL BRUISING OR BLEEDING *URINARY PROBLEMS (pain or burning when urinating, or frequent urination) *BOWEL PROBLEMS (unusual diarrhea, constipation, pain near the anus) TENDERNESS IN MOUTH AND THROAT WITH OR WITHOUT PRESENCE OF ULCERS (sore throat, sores in mouth, or a toothache) UNUSUAL RASH, SWELLING OR PAIN  UNUSUAL VAGINAL DISCHARGE OR ITCHING   Items with * indicate a potential emergency and should be followed up as soon as possible or go to the Emergency Department if any problems should occur.  Please show the CHEMOTHERAPY ALERT CARD or IMMUNOTHERAPY ALERT CARD at check-in  to the Emergency Department and triage nurse.  Should you have questions after your visit or need to cancel or reschedule your appointment, please contact [facility]  Dept: [phone]  and follow the prompts.  Office hours are 8:00 a.m. to 4:30 p.m. Monday - Friday. Please note that voicemails left after 4:00 p.m. may not be returned until the following business day.  We are closed weekends and major holidays. You have access to a nurse at all times for urgent questions. Please call the main number to the clinic Dept: [phone] and follow the prompts.  For any non-urgent questions, you may also contact your provider using MyChart. We now offer e-Visits for anyone 38 and older to request care online for non-urgent symptoms. For details visit [email or web address].   Also download the MyChart app! Go to the app store, search "MyChart", open the app, select [facility], and log in with your MyChart username and password.  Due to Covid, a mask is required upon entering the hospital/clinic. If you do not have a mask, one will be given to you upon arrival. For doctor visits, patients may have 1 support person aged 91 or older with them. For treatment visits, patients cannot have anyone with them due to current Covid guidelines and our immunocompromised population.

## 2021-12-01 IMAGING — CT CT CHEST W/O CM
2 of 4 series · 15 of 36 positions shown, 18 images · non-contrast
Comparison: March 06, 2021.

CLINICAL DATA: A 89-year-old male with history of non-small cell
lung cancer, assess treatment response.

EXAM:
CT CHEST WITHOUT CONTRAST
TECHNIQUE: Multidetector CT imaging of the chest was performed following the
standard protocol without IV contrast.

[Series 2: chest 2.00 · axial · 0.70mm/px · z∈[-1255,-937]mm · 12 of 189 slices shown, 15 images]
[im 15/189  mediastinal]
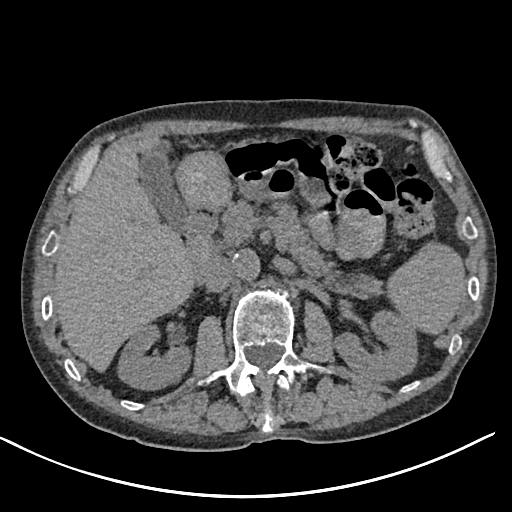
[im 15/189  lung]
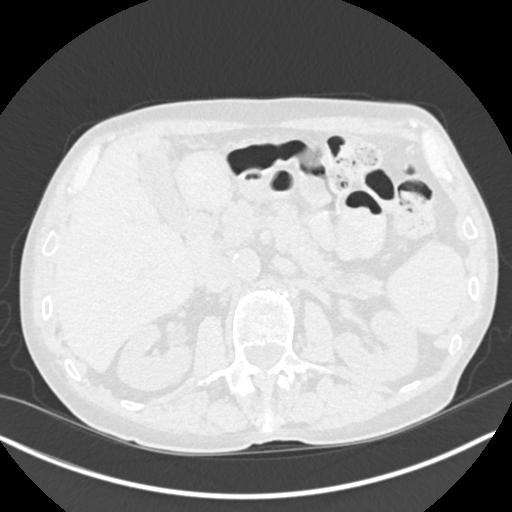
[im 29/189  lung]
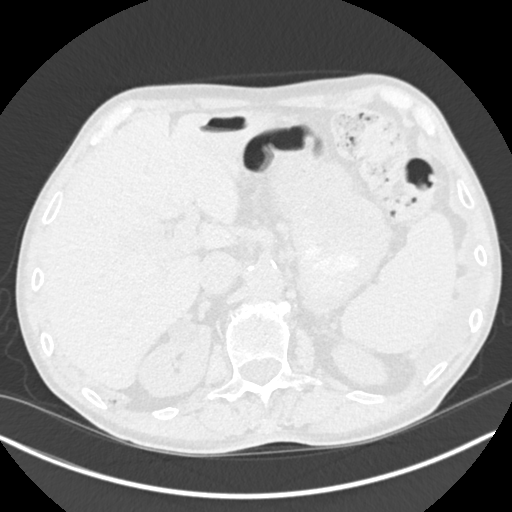
[im 44/189  lung]
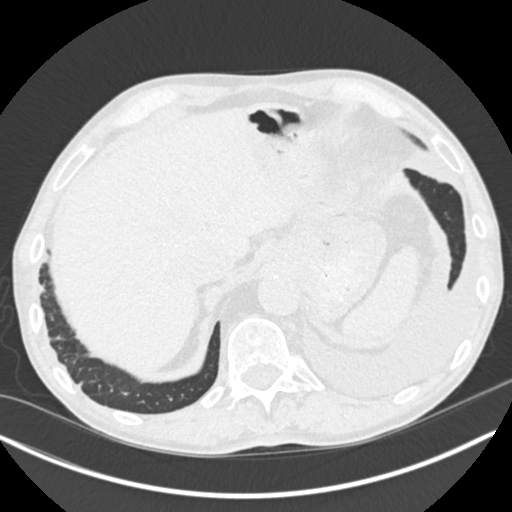
[im 58/189  lung]
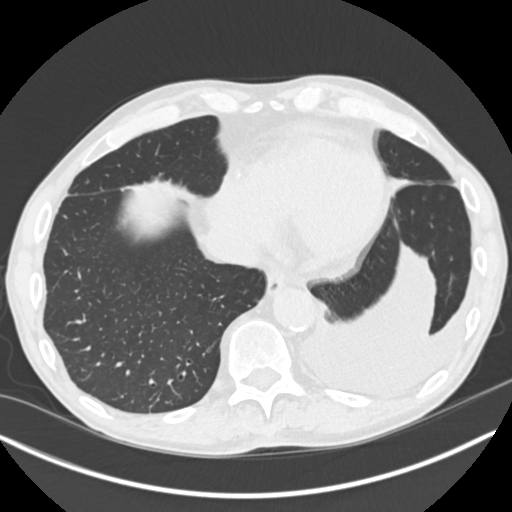
[im 73/189  mediastinal]
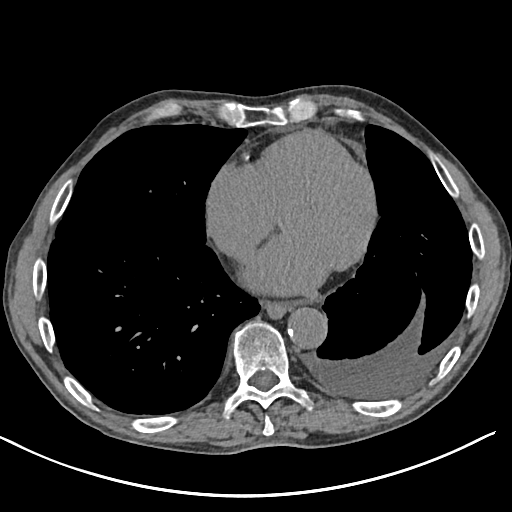
[im 73/189  lung]
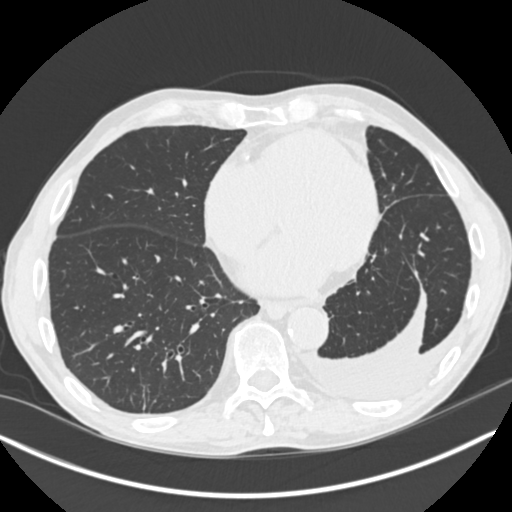
[im 87/189  lung]
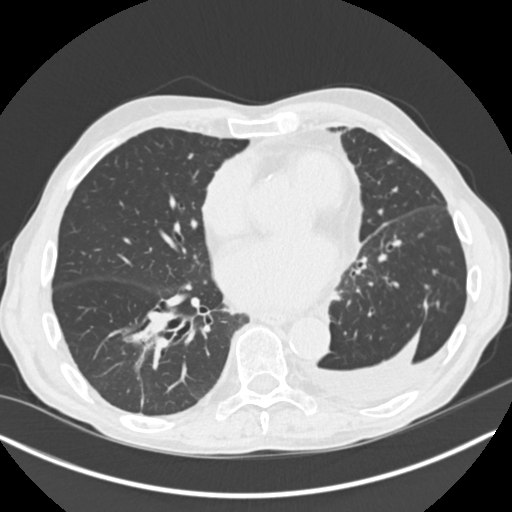
[im 102/189  lung]
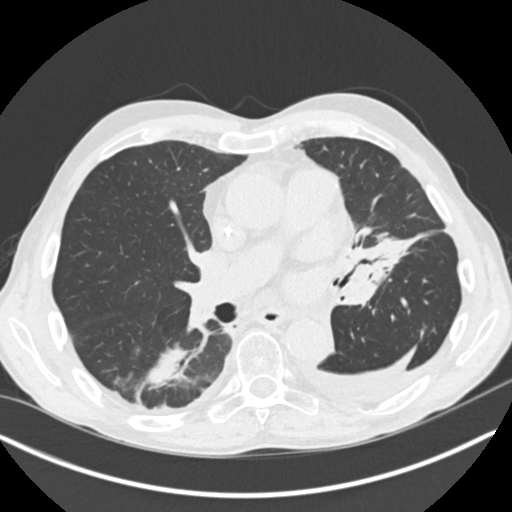
[im 116/189  lung]
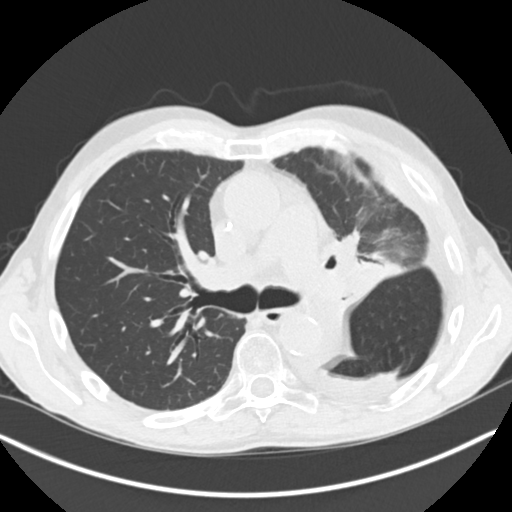
[im 131/189  mediastinal]
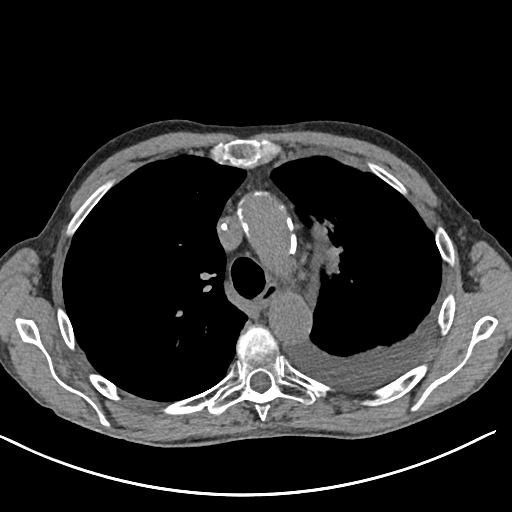
[im 131/189  lung]
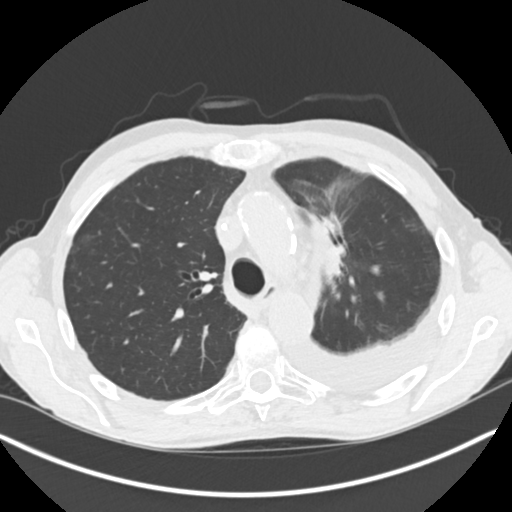
[im 145/189  lung]
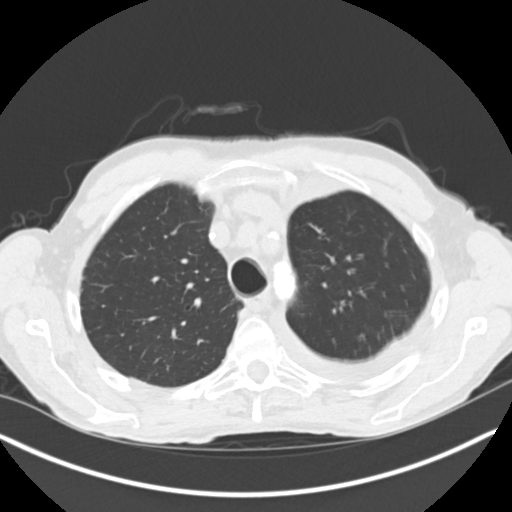
[im 160/189  lung]
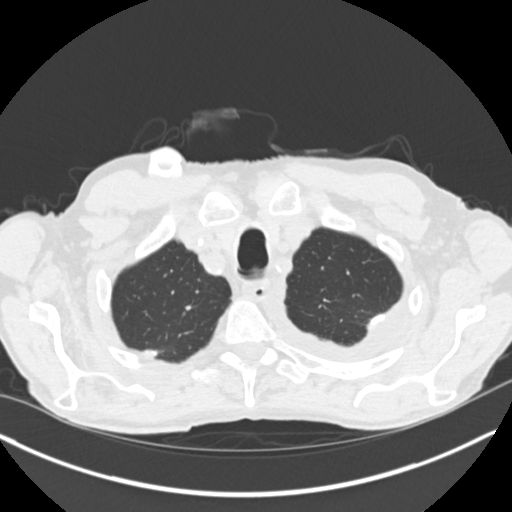
[im 174/189  lung]
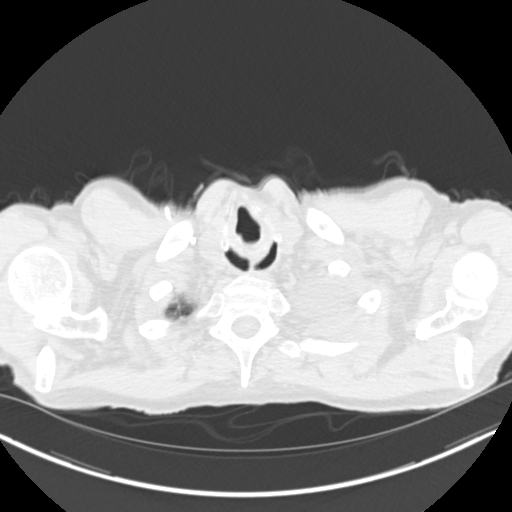

[Series 5: coronals chest 2.00 cor · coronal · 0.70mm/px · 3 of 137 slices shown]
[im 28/137  lung]
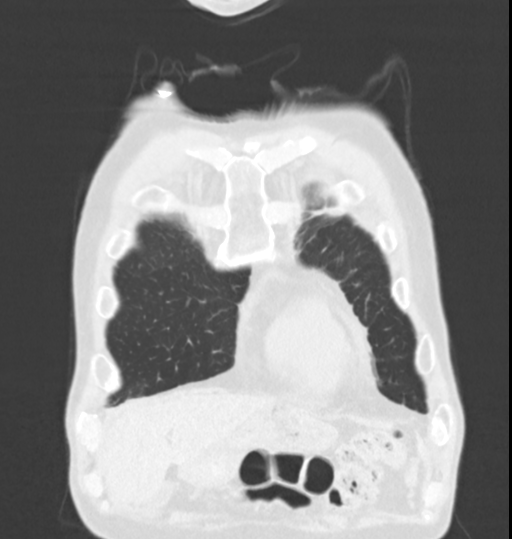
[im 55/137  lung]
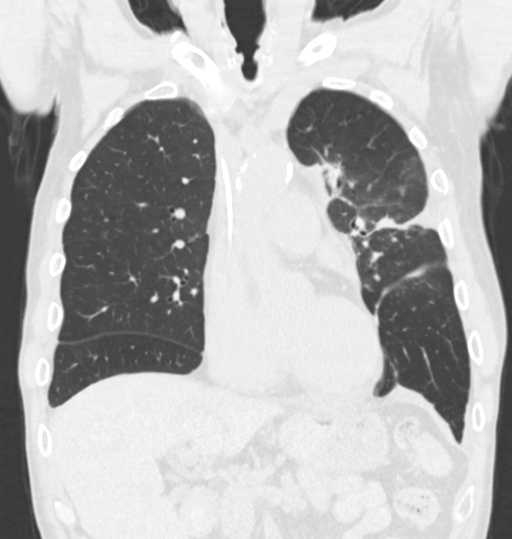
[im 82/137  lung]
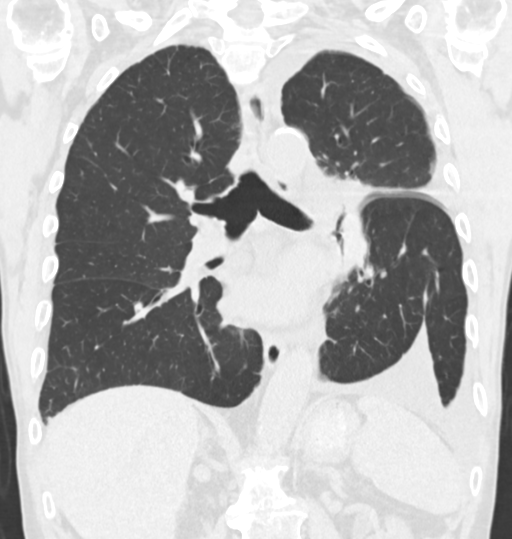

[15 of 36 positions shown; findings below may reference images not displayed]

FINDINGS: Cardiovascular: Calcified atheromatous plaque in the thoracic aorta
without signs of change from previous imaging. Caliber of ascending
thoracic aorta is stable and nonaneurysmal. Mild cardiac enlargement
with small pericardial effusion. Signs of scattered coronary artery
calcifications. Limited assessment of cardiovascular structures
given lack of intravenous contrast. RIGHT-sided Port-A-Cath
terminating at the caval to atrial junction.

Mediastinum/Nodes: No thoracic inlet lymphadenopathy. No axillary
lymphadenopathy. No mediastinal or gross hilar lymphadenopathy.
Indistinct LEFT hilum in the setting of post treatment changes.

Lungs/Pleura: Small LEFT-sided pleural effusion is slightly
increased compared to the previous exam this now extends towards the
LEFT lung apex, it terminated in the LEFT mid chest before and there
is slight increase in sub pulmonic component as well.

Bandlike changes in the anterior LEFT upper lobe with decreased
conspicuity of areas of consolidation and ground-glass that were
seen in the more anterior LEFT chest on the prior study and further
contraction of masslike area near the LEFT hilum and more bandlike
appearance of changes arising from the LEFT hilum. Bronchiectasis
amidst these changes extending into superior LEFT lower lobe and
into lingula and LEFT upper lobe.

Consolidative changes and bronchiectasis also in the superior
segment of the RIGHT lower lobe measuring 3.2 x 2.5 cm, when
measured in a similar fashion approximately 3.7 cm greatest
dimension on the prior study. No RIGHT-sided effusion. No
pneumothorax. Tiny pulmonary nodule in the RIGHT mid chest in the
RIGHT middle lobe (image 96/3) 4 mm. Airways are patent.

Upper Abdomen: No acute findings relative to visualized upper
abdominal contents. The adrenal glands are normal.

Musculoskeletal: No acute musculoskeletal process. Spinal
degenerative changes.
IMPRESSION: 1. Further evolution of post treatment changes in the LEFT chest
with contraction of areas of consolidation about the LEFT hilum and
decreased conspicuity of arc like ground-glass and bandlike changes
in the anterior LEFT chest.
2. Slight increase in LEFT-sided pleural effusion, attention on
follow-up.
3. Decreased size of presumed post treatment changes in the superior
segment of the RIGHT lower lobe with adjacent fissural distortion.
4. Tiny 4 mm pulmonary nodule in the RIGHT mid chest in the RIGHT
middle lobe, unchanged
5. Redemonstration of L1 compression fracture and spinal
degenerative changes.
6. Aortic atherosclerosis.

Aortic Atherosclerosis ([id number]).

## 2021-12-03 ENCOUNTER — Inpatient Hospital Stay: Payer: Medicare Other | Attending: Internal Medicine

## 2021-12-03 ENCOUNTER — Telehealth: Payer: Self-pay | Admitting: *Deleted

## 2021-12-03 ENCOUNTER — Other Ambulatory Visit: Payer: Self-pay

## 2021-12-03 ENCOUNTER — Inpatient Hospital Stay (HOSPITAL_BASED_OUTPATIENT_CLINIC_OR_DEPARTMENT_OTHER): Payer: Medicare Other | Admitting: Hospice and Palliative Medicine

## 2021-12-03 ENCOUNTER — Encounter: Payer: Self-pay | Admitting: Internal Medicine

## 2021-12-03 ENCOUNTER — Ambulatory Visit
Admission: RE | Admit: 2021-12-03 | Discharge: 2021-12-03 | Disposition: A | Discharge status: Home / Self Care | Payer: Medicare Other | Source: Ambulatory Visit | Attending: Hospice and Palliative Medicine | Admitting: Hospice and Palliative Medicine

## 2021-12-03 VITALS — BP 140/71 | HR 57 | Temp 96.7°F | Resp 16

## 2021-12-03 VITALS — Temp 96.7°F

## 2021-12-03 DIAGNOSIS — R609 Edema, unspecified: Secondary | ICD-10-CM | POA: Diagnosis not present

## 2021-12-03 DIAGNOSIS — R531 Weakness: Secondary | ICD-10-CM | POA: Diagnosis not present

## 2021-12-03 DIAGNOSIS — J90 Pleural effusion, not elsewhere classified: Secondary | ICD-10-CM | POA: Insufficient documentation

## 2021-12-03 DIAGNOSIS — C3432 Malignant neoplasm of lower lobe, left bronchus or lung: Secondary | ICD-10-CM

## 2021-12-03 DIAGNOSIS — R29898 Other symptoms and signs involving the musculoskeletal system: Secondary | ICD-10-CM

## 2021-12-03 DIAGNOSIS — I4891 Unspecified atrial fibrillation: Secondary | ICD-10-CM | POA: Insufficient documentation

## 2021-12-03 DIAGNOSIS — Z95828 Presence of other vascular implants and grafts: Secondary | ICD-10-CM

## 2021-12-03 DIAGNOSIS — Z452 Encounter for adjustment and management of vascular access device: Secondary | ICD-10-CM | POA: Diagnosis not present

## 2021-12-03 LAB — CBC WITH DIFFERENTIAL/PLATELET
Abs Immature Granulocytes: 0.03 10*3/uL (ref 0.00–0.07)
Basophils Absolute: 0 10*3/uL (ref 0.0–0.1)
Basophils Relative: 0 %
Eosinophils Absolute: 0.1 10*3/uL (ref 0.0–0.5)
Eosinophils Relative: 2 %
HCT: 41.3 % (ref 39.0–52.0)
Hemoglobin: 13.7 g/dL (ref 13.0–17.0)
Immature Granulocytes: 1 %
Lymphocytes Relative: 27 %
Lymphs Abs: 1.5 10*3/uL (ref 0.7–4.0)
MCH: 31.8 pg (ref 26.0–34.0)
MCHC: 33.2 g/dL (ref 30.0–36.0)
MCV: 95.8 fL (ref 80.0–100.0)
Monocytes Absolute: 0.5 10*3/uL (ref 0.1–1.0)
Monocytes Relative: 10 %
Neutro Abs: 3.3 10*3/uL (ref 1.7–7.7)
Neutrophils Relative %: 60 %
Platelets: 233 10*3/uL (ref 150–400)
RBC: 4.31 MIL/uL (ref 4.22–5.81)
RDW: 13.2 % (ref 11.5–15.5)
WBC: 5.4 10*3/uL (ref 4.0–10.5)
nRBC: 0 % (ref 0.0–0.2)

## 2021-12-03 LAB — COMPREHENSIVE METABOLIC PANEL
ALT: 20 U/L (ref 0–44)
AST: 31 U/L (ref 15–41)
Albumin: 4 g/dL (ref 3.5–5.0)
Alkaline Phosphatase: 118 U/L (ref 38–126)
Anion gap: 7 (ref 5–15)
BUN: 20 mg/dL (ref 8–23)
CO2: 25 mmol/L (ref 22–32)
Calcium: 8.6 mg/dL — ABNORMAL LOW (ref 8.9–10.3)
Chloride: 103 mmol/L (ref 98–111)
Creatinine, Ser: 1.02 mg/dL (ref 0.61–1.24)
GFR, Estimated: >60 mL/min (ref >60)
Glucose, Bld: 106 mg/dL — ABNORMAL HIGH (ref 70–99)
Potassium: 4.2 mmol/L (ref 3.5–5.1)
Sodium: 135 mmol/L (ref 135–145)
Total Bilirubin: 0.5 mg/dL (ref 0.3–1.2)
Total Protein: 7.1 g/dL (ref 6.5–8.1)

## 2021-12-03 LAB — CK: Total CK: 131 U/L (ref 49–397)

## 2021-12-03 LAB — C-REACTIVE PROTEIN: CRP: 0.6 mg/dL (ref <1.0)

## 2021-12-03 MED ORDER — DEXAMETHASONE 2 MG PO TABS: 2.0000 mg | ORAL_TABLET | Freq: Every day | ORAL | 0 refills | Status: DC

## 2021-12-03 MED ORDER — SODIUM CHLORIDE 0.9% FLUSH
10.0000 mL | Freq: Once | INTRAVENOUS | Status: AC
  Administered 2021-12-03: 10 mL via INTRAVENOUS
  Filled 2021-12-03: qty 10

## 2021-12-03 MED ORDER — GADOBUTROL 1 MMOL/ML IV SOLN
7.0000 mL | Freq: Once | INTRAVENOUS | Status: AC | PRN
  Administered 2021-12-03: 7 mL via INTRAVENOUS

## 2021-12-03 MED ORDER — HEPARIN SOD (PORK) LOCK FLUSH 100 UNIT/ML IV SOLN
500.0000 [IU] | Freq: Once | INTRAVENOUS | Status: AC
  Administered 2021-12-03: 500 [IU] via INTRAVENOUS
  Filled 2021-12-03: qty 5

## 2021-12-03 NOTE — Progress Notes (Signed)
Symptom Management [place] at [facility] [phone] [phone]  Patient Care Team: Baxter Hire, MD as PCP - General (Internal Medicine) [name], RN as Oncology Nurse Navigator [name], MD as Consulting Physician (Hematology and Oncology)   Name of the patient: Jason Warren  [id number]  1929-03-18   Date of visit: 12/03/21  Reason for Consult: Jason Warren is a 88 y.o. male with multiple medical problems including stage III non-small cell lung cancer originally diagnosed November 2021 status post CarboTaxol RT.  Status post Imfinzi for 12 months.   Patient was last seen by Dr. Rogue Bussing on 11/27/2018 at which time he was noted to ambulatory but limping thought secondary to left hip arthritis.  CT of the chest November 04, 2021 was without any evidence of recurrent disease.  Patient presents to [facility] today with a couple of weeks of worsening left-sided lower extremity weakness.  He had a recent fall but did not sustain any traumatic injury.  He says that he is having difficulty ambulating with a shuffling gait and is having to deliberately pick up his foot for placement.  He denies any changes in strength/weakness to the upper extremities.  He denies headaches or visual changes.  Denies any neurologic complaints. Denies recent fevers or illnesses. Denies any easy bleeding or bruising. Reports good appetite and denies weight loss. Denies chest pain. Denies any nausea, vomiting, constipation, or diarrhea. Denies urinary complaints. Patient offers no further specific complaints today.    PAST MEDICAL HISTORY: Past Medical History:  Diagnosis Date   Arthritis    Dysrhythmia    A-fib   Hip pain    left   HOH (hard of hearing)    LBBB (left bundle branch block) 08/05/2020   Lung cancer ([place])    Skin cancer, basal cell     face top of head    PAST SURGICAL HISTORY:  Past Surgical History:  Procedure  Laterality Date   CATARACT EXTRACTION W/PHACO Right 07/13/2017   Procedure: CATARACT EXTRACTION PHACO AND INTRAOCULAR LENS PLACEMENT ([place]);  Surgeon: Leandrew Koyanagi, MD;  Location: [place];  Service: Ophthalmology;  Laterality: Right;  IVA TOPICAL RIGHT   CATARACT EXTRACTION W/PHACO Left 01/04/2018   Procedure: CATARACT EXTRACTION PHACO AND INTRAOCULAR LENS PLACEMENT ([place]) LEFT;  Surgeon: Leandrew Koyanagi, MD;  Location: [place];  Service: Ophthalmology;  Laterality: Left;   COLONOSCOPY     IR IMAGING GUIDED PORT INSERTION  08/27/2020   JOINT REPLACEMENT     Left total hip Dr. Su Hoff 08-04-18   ROTATOR CUFF REPAIR Right    SKIN CANCER EXCISION     face   TOTAL HIP ARTHROPLASTY Left 08/04/2018   Procedure: TOTAL HIP ARTHROPLASTY ANTERIOR APPROACH;  Surgeon: Frederik Pear, MD;  [facility];  Service: Orthopedics;  Laterality: Left;   VIDEO BRONCHOSCOPY WITH ENDOBRONCHIAL NAVIGATION N/A 08/07/2020   Procedure: VIDEO BRONCHOSCOPY WITH ENDOBRONCHIAL NAVIGATION;  Surgeon: Ottie Glazier, MD;  Location: [facility];  Service: Thoracic;  Laterality: N/A;   VIDEO BRONCHOSCOPY WITH ENDOBRONCHIAL ULTRASOUND N/A 08/07/2020   Procedure: VIDEO BRONCHOSCOPY WITH ENDOBRONCHIAL ULTRASOUND;  Surgeon: Ottie Glazier, MD;  Location: [facility];  Service: Thoracic;  Laterality: N/A;    HEMATOLOGY/ONCOLOGY HISTORY:  Oncology History Overview Note  # NOV 2021- LEFT LOWER LOBE NON-SMALL CELL CA [favor adeno ca]; T2N3 [right hilar; subcarinal LN;[name]; NOV 2021-MRI [place]  # 11/30- carbo-Taxol-RT [RT until 10/23/20]; s/p IMFINZI on 2/24. [26 months]  # Right lower  lobe - ~4.6 x 3.1 cm  [PET 07-2020]- demonstrates no significant hypermetabolic activity (SUV max 1.8]-benign. STABLE.   # A.fib [Eliquis; Dr.Kowalski]  # # SURVIVORSHIP:   # GENETICS:   #NGS-ordered  DIAGNOSIS: Lung cancer  STAGE:   III      ;  GOALS:  cure  CURRENT/MOST RECENT THERAPY :  carbo-Taxol-RT ]   Cancer of lower lobe of left lung ([place])  08/14/2020 Initial Diagnosis   Cancer of lower lobe of left lung ([place])   09/02/2020 - 10/22/2020 Chemotherapy    Patient is on Treatment Plan: LUNG DURVALUMAB Q14D       10/16/2020 Cancer Staging   Staging form: Lung, AJCC 8th Edition - Clinical: Stage IIIB (cT2a, cN3, cM0) - Signed by [name], MD on 10/16/2020    12/17/2020 -  Chemotherapy   Patient is on Treatment Plan : LUNG Durvalumab q14d       ALLERGIES:  has No Known Allergies.  MEDICATIONS:  Current Outpatient Medications  Medication Sig Dispense Refill   acetaminophen (TYLENOL) 500 MG tablet Take 500 mg by mouth every 8 (eight) hours as needed for moderate pain.     apixaban (ELIQUIS) 5 MG TABS tablet Take 5 mg by mouth 2 (two) times daily.     hydroxypropyl methylcellulose / hypromellose (ISOPTO TEARS / GONIOVISC) 2.5 % ophthalmic solution Place 1 drop into the left eye daily as needed for dry eyes.     lidocaine-prilocaine (EMLA) cream Apply 1 application topically as needed. Apply small amount to port site at least 1 hour prior to it being accessed, cover with plastic wrap 30 g 1   Multiple Vitamin (MULTIVITAMIN) tablet Take 1 tablet by mouth daily.     pravastatin (PRAVACHOL) 80 MG tablet Take 80 mg by mouth at bedtime.      saw palmetto 160 MG capsule Take 160 mg by mouth 2 (two) times daily.     No current facility-administered medications for this visit.   Facility-Administered Medications Ordered in Other Visits  Medication Dose Route Frequency Provider Last Rate Last Admin   heparin lock flush 100 UNIT/ML injection            sodium chloride flush (NS) 0.9 % injection 10 mL  10 mL Intravenous PRN Charlaine Dalton R, MD   10 mL at 02/11/21 0835    VITAL SIGNS: BP 140/71    Pulse (!) 57    Temp (!) 96.7 F (35.9 C) (Tympanic)    Resp 16    SpO2 100%  There were no vitals filed for this visit.  Estimated body mass index is 22.05  kg/m as calculated from the following:   Height as of 10/30/21: 6' (1.829 m).   Weight as of 11/27/21: 162 lb 9.6 oz (73.8 kg).  LABS: CBC:    Component Value Date/Time   WBC 5.9 11/27/2021 0752   HGB 13.9 11/27/2021 0752   HCT 41.7 11/27/2021 0752   PLT 232 11/27/2021 0752   MCV 96.1 11/27/2021 0752   NEUTROABS 3.7 11/27/2021 0752   LYMPHSABS 1.4 11/27/2021 0752   MONOABS 0.6 11/27/2021 0752   EOSABS 0.1 11/27/2021 0752   BASOSABS 0.0 11/27/2021 0752   Comprehensive Metabolic Panel:    Component Value Date/Time   NA 135 11/27/2021 0752   K 4.3 11/27/2021 0752   CL 104 11/27/2021 0752   CO2 25 11/27/2021 0752   BUN 19 11/27/2021 0752   CREATININE 0.99 11/27/2021 0752   GLUCOSE 149 (H) 11/27/2021  0960   CALCIUM 9.0 11/27/2021 0752   AST 32 11/27/2021 0752   ALT 21 11/27/2021 0752   ALKPHOS 122 11/27/2021 0752   BILITOT 0.4 11/27/2021 0752   PROT 7.4 11/27/2021 0752   ALBUMIN 4.1 11/27/2021 0752    RADIOGRAPHIC STUDIES: CT CHEST WO CONTRAST  Result Date: 11/04/2021 CLINICAL DATA:  Non-small cell lung cancer, assess treatment response EXAM: CT CHEST WITHOUT CONTRAST TECHNIQUE: Multidetector CT imaging of the chest was performed following the standard protocol without IV contrast. RADIATION DOSE REDUCTION: This exam was performed according to the departmental dose-optimization program which includes automated exposure control, adjustment of the mA and/or kV according to patient size and/or use of iterative reconstruction technique. COMPARISON:  Multiple prior exams, most recent dated August 06, 2021 FINDINGS: Cardiovascular: Mild cardiomegaly. Trace pericardial effusion left main and RCA coronary artery calcifications. Atherosclerotic disease of the thoracic aorta. Right chest wall port with tip near the superior cavoatrial junction. Mediastinum/Nodes: Esophagus and thyroid are unremarkable. No pathologically enlarged lymph nodes seen in the chest. Esophagus and thyroid are  unremarkable. Lungs/Pleura: Left paramediastinal post radiation changes are stable when compared with prior exam. Right lower lobe bronchiectasis and linear consolidations with adjacent ground-glass component. Stable small left pleural effusion. Interval resolution of right middle lobe pulmonary nodule with surrounding ground-glass, compatible with resolved infectious or inflammatory process. Additional previously seen pulmonary nodules are stable. Reference stable small solid pulmonary nodule of the right middle lobe measuring 3 mm on series 3, image 97. Upper Abdomen: Simple cyst of the left kidney. No acute abnormality. Musculoskeletal: Unchanged T12 and L1 compression deformities and remote sternal fracture. No acute osseous abnormality. IMPRESSION: 1. Stable left perihilar post radiation changes with no evidence of recurrent disease. 2. Stable right lower lobe bronchiectasis and linear consolidations which may be treatment related or sequela of prior infection. Adjacent ground-glass component is unchanged when compared with multiple prior exams dating back to July 07, 2020 and concerning for indolent adenocarcinoma. Recommend attention on follow-up. 3. No evidence of metastatic disease in the chest. 4. Medial right middle lobe pulmonary nodule is no longer present, compatible with resolved infectious or inflammatory process. Additional previously seen pulmonary nodules are stable. 5. Unchanged small left pleural effusion. Electronically Signed   By: Yetta Glassman M.D.   On: 11/04/2021 14:47    PERFORMANCE STATUS (ECOG) : 2 - Symptomatic, <50% confined to bed  Review of Systems Unless otherwise noted, a complete review of systems is negative.  Physical Exam General: NAD Cardiovascular: regular rate and rhythm Pulmonary: clear ant fields Abdomen: soft, nontender, + bowel sounds GU: no suprapubic tenderness Extremities: no edema, no joint deformities Skin: no rashes Neurological: Proximal LLE  weakness, distal strength appears to be intact.  Normal reflexes.  CNs grossly intact  Assessment and Plan- Patient is a 88 y.o. male with multiple medical problems including stage III non-small cell lung cancer originally diagnosed November 2021 status post CarboTaxol RT.  Status post Imfinzi for 12 months.  Patient presents to [facility] for evaluation of weakness   Weakness -patient sent for stat MRI of the brain.  Unfortunately, MRI revealed a 2.1 cm metastatic lesion in the right paracentral lobule.  There was mild edema.  Case discussed with Dr. Rogue Bussing and referral sent to Dr. Baruch Gouty for consideration of RT.  We will start patient on low-dose dexamethasone.  Fall precautions reviewed extensively with patient and family.  I would suggest that he utilize a walker with ambulation.  We will have him evaluated  by Gwenette Greet in clinic.    Patient expressed understanding and was in agreement with this plan. He also understands that He can call clinic at any time with any questions, concerns, or complaints.   Thank you for allowing me to participate in the care of this very pleasant patient.   Time Total: 25 minutes  Visit consisted of counseling and education dealing with the complex and emotionally intense issues of symptom management in the setting of serious illness.Greater than 50%  of this time was spent counseling and coordinating care related to the above assessment and plan.  Signed by: Altha Harm, PhD, NP-C

## 2021-12-03 NOTE — Progress Notes (Signed)
Pt added to [facility] today with concern of leg weakness x2 weeks. Pt states that the left leg seems weaker than the right. He also reports falling yesterday, but denies any injuries. He endorses a good appetite and fluid intake and denies any nausea, vomiting, diarrhea, or dizziness.  ?

## 2021-12-03 NOTE — Progress Notes (Signed)
I spoke to patient regarding the results of the MRI brain consistent with metastases. Patient currently awaiting appointment with Dr. Baruch Gouty. Patient is waiting to start steroids. Recommend patient does not drive. ? ?C- Please have the patient follow up with me in the next 1 to 2 weeks , MD- lab CBC,BMP.

## 2021-12-03 NOTE — Telephone Encounter (Signed)
Patient called stating that he needs to be seen, He has had weakness in his legs since a few days after starting Immunotherapy and states he is having great difficulty walking and fears he is going to "fall and get killed" He states he has no other symptoms except the leg weakness. Please advise. ?

## 2021-12-03 NOTE — Telephone Encounter (Signed)
Left detailed vm regarding the referral to OT screen with [facility]. Mychart msg sent to patient. Apt scheduled with Gwenette Greet to hold apt slot for patient. ?

## 2021-12-03 NOTE — Telephone Encounter (Signed)
Call returned to patient and offered appointment at 1030 per request of B Aldridge and he has accepted appointment  ?

## 2021-12-03 NOTE — Addendum Note (Signed)
Addended by: Jonnie Finner D on: 12/03/2021 01:29 PM ? ? Modules accepted: Orders ? ?

## 2021-12-04 ENCOUNTER — Encounter: Payer: Self-pay | Admitting: Internal Medicine

## 2021-12-07 ENCOUNTER — Other Ambulatory Visit: Payer: Self-pay

## 2021-12-07 ENCOUNTER — Other Ambulatory Visit: Payer: Self-pay | Admitting: *Deleted

## 2021-12-07 ENCOUNTER — Ambulatory Visit
Admission: RE | Admit: 2021-12-07 | Discharge: 2021-12-07 | Disposition: A | Discharge status: Home / Self Care | Payer: Medicare Other | Source: Ambulatory Visit | Attending: Radiation Oncology | Admitting: Radiation Oncology

## 2021-12-07 ENCOUNTER — Encounter: Payer: Self-pay | Admitting: Radiation Oncology

## 2021-12-07 ENCOUNTER — Telehealth: Payer: Self-pay | Admitting: *Deleted

## 2021-12-07 VITALS — BP 144/80 | HR 55 | Resp 16 | Wt 163.0 lb

## 2021-12-07 DIAGNOSIS — R609 Edema, unspecified: Secondary | ICD-10-CM | POA: Insufficient documentation

## 2021-12-07 DIAGNOSIS — C7931 Secondary malignant neoplasm of brain: Secondary | ICD-10-CM | POA: Diagnosis not present

## 2021-12-07 DIAGNOSIS — Z9221 Personal history of antineoplastic chemotherapy: Secondary | ICD-10-CM | POA: Insufficient documentation

## 2021-12-07 DIAGNOSIS — R531 Weakness: Secondary | ICD-10-CM | POA: Insufficient documentation

## 2021-12-07 DIAGNOSIS — C3432 Malignant neoplasm of lower lobe, left bronchus or lung: Secondary | ICD-10-CM | POA: Insufficient documentation

## 2021-12-07 DIAGNOSIS — Z923 Personal history of irradiation: Secondary | ICD-10-CM | POA: Insufficient documentation

## 2021-12-07 NOTE — Telephone Encounter (Signed)
Called and spoke with Jason Warren, appointments given for MRI, CT simulation and the radiation [facility] appointment.  Jason Warren readback all dates, times, and locations.    ?

## 2021-12-07 NOTE — Progress Notes (Signed)
Radiation Oncology ?Follow up Note old patient new area solitary brain metastasis ? ?Name: Jason Warren   ?Date:   12/07/2021 ?MRN:  [id number] ?DOB: June 24, 1929  ? ? ?This 88 y.o. male presents to the clinic today for evaluation of solitary brain metastasis and patient previously treated.  Over a year ago for stage IIIb adenocarcinoma of the lung with concurrent chemoradiation ? ?REFERRING PROVIDER: Baxter Hire, MD ? ?HPI: Patient is a 88 year old male now out over a year having completed concurrent chemoradiation therapy for stage IIIb adenocarcinoma left lung.  He recently developed some left lower extremity weakness.  MRI of his brain confirmed.  A 2.1 cm peripheral enhancing lesion in the right paracentral lobule concerning for metastatic disease.  The worst some mild edema in this region.  There was no midline shift.  His most recent CT scans of the chest back in January showed stable left perihilar postradiation changes with no evidence of recurrent disease.  He is having no other focal neurologic problems.  He is current on low-dose Decadron. ? ?COMPLICATIONS OF TREATMENT: none ? ?FOLLOW UP COMPLIANCE: keeps appointments  ? ?PHYSICAL EXAM:  ?BP (!) 144/80 (BP Location: Left Arm, Patient Position: Sitting)   Pulse (!) 55   Resp 16   Wt 163 lb (73.9 kg)   BMI 22.11 kg/m?  ?Motor or sensory levels in lower extremities appear equal and symmetric.  Proprioception is intact.  Crude visual fields are within normal range.  Well-developed well-nourished patient in NAD. HEENT reveals PERLA, EOMI, discs not visualized.  Oral cavity is clear. No oral mucosal lesions are identified. Neck is clear without evidence of cervical or supraclavicular adenopathy. Lungs are clear to A&P. Cardiac examination is essentially unremarkable with regular rate and rhythm without murmur rub or thrill. Abdomen is benign with no organomegaly or masses noted. Motor sensory and DTR levels are equal and symmetric in the upper and lower  extremities. Cranial nerves II through XII are grossly intact. Proprioception is intact. No peripheral adenopathy or edema is identified. No motor or sensory levels are noted. Crude visual fields are within normal range. ? ?RADIOLOGY RESULTS: MRI scans and CT scans reviewed compatible with above-stated findings ? ?PLAN: At this time would recommend SRS.I have ordered a thin slice MRI scan for treatment planning purposes.  Would use MRI fusion study.  Would plan on delivering 20 Gray in 1 fraction.  Risks and benefits of treatment including possible fatigue skin reaction for all were reviewed with the patient and his family.  Once we have his MRI thin cut ordered we will simulate him the next day or 2.  Patient and family comprehend my recommendations well. ? ?I would like to take this opportunity to thank you for allowing me to participate in the care of your patient.. ?  ? Noreene Filbert, MD ? ?

## 2021-12-16 ENCOUNTER — Ambulatory Visit
Admission: RE | Admit: 2021-12-16 | Discharge: 2021-12-16 | Disposition: A | Discharge status: Home / Self Care | Payer: Medicare Other | Source: Ambulatory Visit | Attending: Radiation Oncology | Admitting: Radiation Oncology

## 2021-12-16 ENCOUNTER — Inpatient Hospital Stay: Payer: Medicare Other | Admitting: Occupational Therapy

## 2021-12-16 ENCOUNTER — Other Ambulatory Visit: Payer: Self-pay

## 2021-12-16 DIAGNOSIS — C3432 Malignant neoplasm of lower lobe, left bronchus or lung: Secondary | ICD-10-CM

## 2021-12-16 DIAGNOSIS — C7931 Secondary malignant neoplasm of brain: Secondary | ICD-10-CM | POA: Diagnosis present

## 2021-12-16 DIAGNOSIS — R29898 Other symptoms and signs involving the musculoskeletal system: Secondary | ICD-10-CM

## 2021-12-16 MED ORDER — GADOBUTROL 1 MMOL/ML IV SOLN
7.0000 mL | Freq: Once | INTRAVENOUS | Status: AC | PRN
  Administered 2021-12-16: 7 mL via INTRAVENOUS

## 2021-12-16 NOTE — Therapy (Signed)
[place] ?[facility] [place]-[facility] Oncology ?[address], [address] ?[place], [place], [address] ?Phone: [phone]   Fax:  [phone] ? ?Occupational Therapy Screen: ? ?Patient Details  ?Name: Jason Warren ?MRN: [id number] ?Date of Birth: 1929/08/20 ?No data recorded ? ?Encounter Date: 12/16/2021 ? ? OT End of Session - 12/16/21 1859   ? ? Visit Number 0   ? ?  ?  ? ?  ? ? ?Past Medical History:  ?Diagnosis Date  ? Arthritis   ? Dysrhythmia   ? A-fib  ? Hip pain   ? left  ? HOH (hard of hearing)   ? LBBB (left bundle branch block) 08/05/2020  ? Lung cancer ([place])   ? Skin cancer, basal cell   ?  face top of head  ? ? ?Past Surgical History:  ?Procedure Laterality Date  ? CATARACT EXTRACTION W/PHACO Right 07/13/2017  ? Procedure: CATARACT EXTRACTION PHACO AND INTRAOCULAR LENS PLACEMENT ([facility]);  Surgeon: Leandrew Koyanagi, MD;  Location: [place];  Service: Ophthalmology;  Laterality: Right;  IVA TOPICAL ?RIGHT  ? CATARACT EXTRACTION W/PHACO Left 01/04/2018  ? Procedure: CATARACT EXTRACTION PHACO AND INTRAOCULAR LENS PLACEMENT ([place]) LEFT;  Surgeon: Leandrew Koyanagi, MD;  Location: [place];  Service: Ophthalmology;  Laterality: Left;  ? COLONOSCOPY    ? IR IMAGING GUIDED PORT INSERTION  08/27/2020  ? JOINT REPLACEMENT    ? Left total hip Dr. Su Hoff 08-04-18  ? ROTATOR CUFF REPAIR Right   ? SKIN CANCER EXCISION    ? face  ? TOTAL HIP ARTHROPLASTY Left 08/04/2018  ? Procedure: TOTAL HIP ARTHROPLASTY ANTERIOR APPROACH;  Surgeon: Frederik Pear, MD;  [facility];  Service: Orthopedics;  Laterality: Left;  ? VIDEO BRONCHOSCOPY WITH ENDOBRONCHIAL NAVIGATION N/A 08/07/2020  ? Procedure: VIDEO BRONCHOSCOPY WITH ENDOBRONCHIAL NAVIGATION;  Surgeon: Ottie Glazier, MD;  Location: [facility];  Service: Thoracic;  Laterality: N/A;  ? VIDEO BRONCHOSCOPY WITH ENDOBRONCHIAL ULTRASOUND N/A 08/07/2020  ? Procedure: VIDEO BRONCHOSCOPY WITH ENDOBRONCHIAL ULTRASOUND;  Surgeon: Ottie Glazier, MD;  Location: [facility];  Service: Thoracic;  Laterality: N/A;  ? ? ?There were no vitals filed for this visit. ? ? Subjective Assessment - 12/16/21 1858   ? ? Subjective  I go to the gym 3 x wk , walk and do things around the house and in yard- did had one fall but that was when working on the bushes in the yard- I do machines and weight   ? Currently in Pain? No/denies   ? ?  ?  ? ?  ? ? ? ? ? ? ?Assessment and Plan- 12/03/21 Josh Borders:  ? Patient is a 88 y.o. male with multiple medical problems including stage III non-small cell lung cancer originally diagnosed November 2021 status post CarboTaxol RT.  Status post Imfinzi for 12 months.  [facility] ?  ?Weakness -patient sent for stat MRI of the brain.  Unfortunately, MRI revealed a 2.1 cm metastatic lesion in the right paracentral lobule.  There was mild edema.  Case discussed with Dr. Rogue Bussing and referral sent to Dr. Baruch Gouty for consideration of RT.  We will start patient on low-dose dexamethasone.  Fall precautions reviewed extensively with patient and family.  I would suggest that he utilize a walker with ambulation.  We will have him evaluated by Gwenette Greet in clinic.  ?  ?  ?Patient expressed understanding and was in agreement with this plan. He also understands that He can  call clinic at any time with any questions, concerns, or complaints.  ? ? ? ? ? ?OT SCREEN 12/16/21:  ?Patient referred to OT for left lower extremity weakness and loss of balance, and fall ?Patient reports today after having steroids left leg is much stronger ?Had only 1 fall outside when was working on the bushes ?Denied any pain ?Patient is accompanied by daughter today-reports he works out in the gym 3 times a week doing some cardio machines treadmill and machines for legs and arms rotating body parts ?Patient has a walk-in shower with a chair that he do not use at this time ?Lives with wife-has 4 steps to enter with rails ?Berg balance  test was done patient scoring 53 out of 56 putting him at low risk for falling ?Only had trouble with alternating placing foot on stool, and standing 1 leg-with the left being weaker than the right ?Sit to stand patient was able to do without hands but straining a little bit ?Patient admitted working more on cardio and strengthening and not as much balance ?Patient and daughter was educated on exercises he can do at home or activities ?-Sidestepping at [employer] ?-Hip abduction and extension with focusing on weightbearing leg with bellybutton ?-Heel raises ?-Standing between 2 chairs marching and standing on one leg with 2 hand support, to 1 left hand ?He can adjust reps or 1 to 2 minutes activity ?No need for further follow-up patient to contact me if needed ?  ? ? ? ? ? ? ? ? ? ? ? ? ? ? ? ? ? ? ? ? ? ? ? ? ? ? ? ? ?  ?  ?  ? ? ?Visit Diagnosis: ?Weakness of extremity ? ? ? ?Problem List ?Patient Active Problem List  ? Diagnosis Date Noted  ? Goals of care, counseling/discussion 08/18/2020  ? Cancer of lower lobe of left lung ([place]) 08/14/2020  ? Primary osteoarthritis of left hip 08/04/2018  ? Osteoarthritis of left hip 08/03/2018  ? ? ?Rosalyn Gess, OTR/L,CLT ?12/16/2021, 7:00 PM ? ?[place] ?[facility] [place] Oncology ?[address], [address] ?[place], [place], [address] ?Phone: [phone]   Fax:  [phone] ? ?Name: Jason Warren ?MRN: [id number] ?Date of Birth: 04/05/1929 ? ?

## 2021-12-17 ENCOUNTER — Inpatient Hospital Stay: Payer: Medicare Other

## 2021-12-17 ENCOUNTER — Ambulatory Visit
Admission: RE | Admit: 2021-12-17 | Discharge: 2021-12-17 | Disposition: A | Discharge status: Home / Self Care | Payer: Medicare Other | Source: Ambulatory Visit | Attending: Radiation Oncology | Admitting: Radiation Oncology

## 2021-12-17 ENCOUNTER — Inpatient Hospital Stay (HOSPITAL_BASED_OUTPATIENT_CLINIC_OR_DEPARTMENT_OTHER): Payer: Medicare Other | Admitting: Internal Medicine

## 2021-12-17 ENCOUNTER — Other Ambulatory Visit: Payer: Self-pay | Admitting: *Deleted

## 2021-12-17 ENCOUNTER — Encounter: Payer: Self-pay | Admitting: Internal Medicine

## 2021-12-17 DIAGNOSIS — C3432 Malignant neoplasm of lower lobe, left bronchus or lung: Secondary | ICD-10-CM

## 2021-12-17 DIAGNOSIS — Z510 Encounter for antineoplastic radiation therapy: Secondary | ICD-10-CM | POA: Diagnosis present

## 2021-12-17 DIAGNOSIS — C7931 Secondary malignant neoplasm of brain: Secondary | ICD-10-CM | POA: Diagnosis present

## 2021-12-17 LAB — CBC WITH DIFFERENTIAL/PLATELET
Abs Immature Granulocytes: 0.1 10*3/uL — ABNORMAL HIGH (ref 0.00–0.07)
Basophils Absolute: 0 10*3/uL (ref 0.0–0.1)
Basophils Relative: 0 %
Eosinophils Absolute: 0 10*3/uL (ref 0.0–0.5)
Eosinophils Relative: 0 %
HCT: 45.3 % (ref 39.0–52.0)
Hemoglobin: 15.1 g/dL (ref 13.0–17.0)
Immature Granulocytes: 1 %
Lymphocytes Relative: 12 %
Lymphs Abs: 1.2 10*3/uL (ref 0.7–4.0)
MCH: 32.3 pg (ref 26.0–34.0)
MCHC: 33.3 g/dL (ref 30.0–36.0)
MCV: 96.8 fL (ref 80.0–100.0)
Monocytes Absolute: 0.9 10*3/uL (ref 0.1–1.0)
Monocytes Relative: 8 %
Neutro Abs: 8.1 10*3/uL — ABNORMAL HIGH (ref 1.7–7.7)
Neutrophils Relative %: 79 %
Platelets: 194 10*3/uL (ref 150–400)
RBC: 4.68 MIL/uL (ref 4.22–5.81)
RDW: 14.2 % (ref 11.5–15.5)
WBC: 10.4 10*3/uL (ref 4.0–10.5)
nRBC: 0 % (ref 0.0–0.2)

## 2021-12-17 LAB — COMPREHENSIVE METABOLIC PANEL
ALT: 24 U/L (ref 0–44)
AST: 23 U/L (ref 15–41)
Albumin: 3.8 g/dL (ref 3.5–5.0)
Alkaline Phosphatase: 93 U/L (ref 38–126)
Anion gap: 7 (ref 5–15)
BUN: 29 mg/dL — ABNORMAL HIGH (ref 8–23)
CO2: 28 mmol/L (ref 22–32)
Calcium: 8.8 mg/dL — ABNORMAL LOW (ref 8.9–10.3)
Chloride: 98 mmol/L (ref 98–111)
Creatinine, Ser: 1.06 mg/dL (ref 0.61–1.24)
GFR, Estimated: >60 mL/min (ref >60)
Glucose, Bld: 74 mg/dL (ref 70–99)
Potassium: 4.3 mmol/L (ref 3.5–5.1)
Sodium: 133 mmol/L — ABNORMAL LOW (ref 135–145)
Total Bilirubin: 0.7 mg/dL (ref 0.3–1.2)
Total Protein: 6.6 g/dL (ref 6.5–8.1)

## 2021-12-17 NOTE — Assessment & Plan Note (Addendum)
#  Stage IV- -non-small cell lung cancer-[favor adenocarcinoma] [at dx-stage III- s/p  concurrent chemoradiation; Finished Feb 2023]; s/p  adjuvant durvalumab [finished Feb 2023].  Unfortunately noted to have brain metastases-on brain MRI- see below.  ? ?#Given the lack of any progressive disease in the chest-I would recommend continued surveillance with the therapy. CT scan-FEB 1st, 2023--continued left lower lobe architectural distortion/fibrosis without any evidence of recurrent disease.  Small stable left-sided pleural effusion  right lower lobe peribronchial architectural distortion [chronic unrelated to malignancy/treatment]; right middle lobe lung nodule-no longer visible.  We will plan repeat a scan again in 3 months or so/as planned in May 2023. ? ?#Brain metastases-March 2023-MRI-brain 2.1 cm peripherally enhancing lesion in the right paracentral lobule ?concerning for metastatic disease. There is mild perilesional edema and regional mass effect but no midline shift. ?On dexamethasone 2 mg a day.  Noted to have significant movement of the left lower extremity weakness.  Plan with SBRT on 3/22.  Recommend periodic surveillance imaging every 3 months or so. ?? ?#Intermittent difficulty swallowing question related to radiation-patient/wife declined any GI evaluation.  Recommend informing us if getting worse.  Discussed that he might need EGD/dilatation.  Stable. ? ?# Mild intermittent elevation of of Alk phos-? From Imfinzi- monitor for now-stable ? ?# left-sided pleural effusion-CT November 2022. Hold off any thoracentesis-stable ? ?# A. fib on Eliquis [Dr.Kowalski]-  Mild swelling in legs- EF in Feb 2022- 55-60%; continue BIL compression stocking/leg elevation-stable ? ?# MediPort flush q 6 W-stable. ? ?# DISPOSITION: ?# follow up in 1 month- port flush; MD; labs- cbc/cmp; ?# cancel port flush on April 7th- ?#Keep apts in May as planned--  ? ?# I reviewed the blood work- with the patient in detail; also  reviewed the imaging independently [as summarized above]; and with the patient in detail.  ? ? ....................................... ? ?# follow up in 3 [may] months-MD labs- cbc/cmp;TSH- CT chest prior--;Dr.B ? ? ? ? ? ?

## 2021-12-17 NOTE — Progress Notes (Signed)
[facility] ?CONSULT NOTE ? ?Patient Care Team: ?Baxter Hire, MD as PCP - General (Internal Medicine) ?[name], RN as Sales executive ?[name], MD as Consulting Physician (Hematology and Oncology) ? ?CHIEF COMPLAINTS/PURPOSE OF CONSULTATION: Lung cancer ? ?#  ?Oncology History Overview Note  ?# NOV 2021- LEFT LOWER LOBE NON-SMALL CELL CA [favor adeno ca]; T2N3 [right hilar; subcarinal LN;[name]; NOV 2021-MRI Brain-NEG ? ?# 11/30- carbo-Taxol-RT [RT until 10/23/20]; s/p IMFINZI on 2/24. [02 months]; finished February 2023. ? ?# #Brain metastases-March 2023-MRI-brain 2.1 cm peripherally enhancing lesion in the right paracentral lobule ?concerning for metastatic disease. There is mild perilesional edema and regional mass effect but no midline shift. ?On dexamethasone 2 mg a day.  Noted to have significant movement of the left lower extremity weakness.  Plan with SBRT on 3/22 ? ?# Right lower lobe - ~4.6 x 3.1 cm  [PET 07-2020]- demonstrates no significant hypermetabolic activity (SUV max 1.8]-benign. STABLE.  ? ? ? ?# A.fib [Eliquis; Dr.Kowalski] ? ?# # SURVIVORSHIP:  ? ?# GENETICS:  ? ?#NGS-ordered ? ?DIAGNOSIS: Lung cancer ? ?STAGE:   III      ;  GOALS:  cure ? ?CURRENT/MOST RECENT THERAPY : carbo-Taxol-RT ?] ?  ?Cancer of lower lobe of left lung ([place])  ?08/14/2020 Initial Diagnosis  ? Cancer of lower lobe of left lung ([place]) ?  ?09/02/2020 - 10/22/2020 Chemotherapy  ?  Patient is on Treatment Plan: LUNG DURVALUMAB V25D ? ?  ? ?  ?10/16/2020 Cancer Staging  ? Staging form: Lung, AJCC 8th Edition ?- Clinical: Stage IIIB (cT2a, cN3, cM0) - Signed by [name], MD on 10/16/2020 ? ?  ?12/17/2020 -  Chemotherapy  ? Patient is on Treatment Plan : LUNG Durvalumab q14d  ?   ? ?HISTORY OF PRESENTING ILLNESS: Walking independently but with a limp because of left hip arthritis.  He is accompanied by his wife/daughter. ? ?Jason Warren 88 y.o.  male patient with with  currently stage IV [stage III lung non-small cell lung-currently s/p adjuvant duvalumab; feb 2023] is here for follow-up/review results of MRI brain. ? ?Approximately 2 weeks ago patient noted to have sudden offset of weakness of his left lower extremity.  MRI concerning for metastasis.  On dexamethasone 2 mg a day.  Noted to have significant improvement of his lower extremity weakness. ? ?Patient has been evaluated by radiation oncology.  He is awaiting simulation this afternoon.  Awaiting SBRT on 3/22 x1 fraction. ? ?denies any shortness of breath.  Intermittent cough not any worse.  Intermittent difficulty swallowing-not any worse.  Denies any swelling in the legs.  Continues to wear compression stockings.   ? ?Review of Systems  ?Constitutional:  Negative for chills, diaphoresis, fever, malaise/fatigue and weight loss.  ?HENT:  Negative for nosebleeds and sore throat.   ?Eyes:  Negative for double vision.  ?Respiratory:  Negative for hemoptysis, sputum production, shortness of breath and wheezing.   ?Cardiovascular:  Negative for chest pain, palpitations, orthopnea and leg swelling.  ?Gastrointestinal:  Negative for abdominal pain, blood in stool, constipation, diarrhea, heartburn, melena, nausea and vomiting.  ?Genitourinary:  Negative for dysuria, frequency and urgency.  ?Musculoskeletal:  Negative for back pain and joint pain.  ?Skin:  Negative for itching.  ?Neurological:  Negative for tingling, focal weakness, weakness and headaches.  ?Endo/Heme/Allergies:  Does not bruise/bleed easily.  ?Psychiatric/Behavioral:  Negative for depression. The patient is not nervous/anxious and does not have insomnia.    ? ?MEDICAL HISTORY:  ?  Past Medical History:  ?Diagnosis Date  ? Arthritis   ? Dysrhythmia   ? A-fib  ? Hip pain   ? left  ? HOH (hard of hearing)   ? LBBB (left bundle branch block) 08/05/2020  ? Lung cancer ([place])   ? Skin cancer, basal cell   ?  face top of head  ? ? ?SURGICAL HISTORY: ?Past Surgical  History:  ?Procedure Laterality Date  ? CATARACT EXTRACTION W/PHACO Right 07/13/2017  ? Procedure: CATARACT EXTRACTION PHACO AND INTRAOCULAR LENS PLACEMENT (IOC);  Surgeon: Leandrew Koyanagi, MD;  Location: [facility];  Service: Ophthalmology;  Laterality: Right;  IVA TOPICAL ?RIGHT  ? CATARACT EXTRACTION W/PHACO Left 01/04/2018  ? Procedure: CATARACT EXTRACTION PHACO AND INTRAOCULAR LENS PLACEMENT ([place]) LEFT;  Surgeon: Leandrew Koyanagi, MD;  Location: [facility];  Service: Ophthalmology;  Laterality: Left;  ? COLONOSCOPY    ? IR IMAGING GUIDED PORT INSERTION  08/27/2020  ? JOINT REPLACEMENT    ? Left total hip Dr. Su Hoff 08-04-18  ? ROTATOR CUFF REPAIR Right   ? SKIN CANCER EXCISION    ? face  ? TOTAL HIP ARTHROPLASTY Left 08/04/2018  ? Procedure: TOTAL HIP ARTHROPLASTY ANTERIOR APPROACH;  Surgeon: Frederik Pear, MD;  [facility];  Service: Orthopedics;  Laterality: Left;  ? VIDEO BRONCHOSCOPY WITH ENDOBRONCHIAL NAVIGATION N/A 08/07/2020  ? Procedure: VIDEO BRONCHOSCOPY WITH ENDOBRONCHIAL NAVIGATION;  Surgeon: Ottie Glazier, MD;  Location: [facility];  Service: Thoracic;  Laterality: N/A;  ? VIDEO BRONCHOSCOPY WITH ENDOBRONCHIAL ULTRASOUND N/A 08/07/2020  ? Procedure: VIDEO BRONCHOSCOPY WITH ENDOBRONCHIAL ULTRASOUND;  Surgeon: Ottie Glazier, MD;  Location: [facility];  Service: Thoracic;  Laterality: N/A;  ? ? ?SOCIAL HISTORY: ?Social History  ? ?Socioeconomic History  ? Marital status: Married  ?  Spouse name: Not on file  ? Number of children: Not on file  ? Years of education: Not on file  ? Highest education level: Not on file  ?Occupational History  ? Not on file  ?Tobacco Use  ? Smoking status: Former  ?  Packs/day: 1.00  ?  Years: 4.00  ?  Pack years: 4.00  ?  Types: Cigarettes  ? Smokeless tobacco: Never  ? Tobacco comments:  ?  quit early 70's  ?Vaping Use  ? Vaping Use: Never used  ?Substance and Sexual Activity  ? Alcohol use: No  ? Drug use: No  ? Sexual activity: Not  Currently  ?Other Topics Concern  ? Not on file  ?Social History Narrative  ? > quit 35 years; smoked for 15 years. Rare alcohol. 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   ? ?Social Determinants of Health  ? ?Financial Resource Strain: Not on file  ?Food Insecurity: Not on file  ?Transportation Needs: Not on file  ?Physical Activity: Not on file  ?Stress: Not on file  ?Social Connections: Not on file  ?Intimate Partner Violence: Not on file  ? ? ?FAMILY HISTORY: ?Family History  ?Problem Relation Age of Onset  ? Throat cancer Brother   ?      & lung cancer  ? ? ?ALLERGIES:  has No Known Allergies. ? ?MEDICATIONS:  ?Current Outpatient Medications  ?Medication Sig Dispense Refill  ? acetaminophen (TYLENOL) 500 MG tablet Take 500 mg by mouth every 8 (eight) hours as needed for moderate pain.    ? apixaban (ELIQUIS) 5 MG TABS tablet Take 5 mg by mouth 2 (two) times  daily.    ? dexamethasone (DECADRON) 2 MG tablet Take 1 tablet (2 mg total) by mouth daily. 30 tablet 0  ? hydroxypropyl methylcellulose / hypromellose (ISOPTO TEARS / GONIOVISC) 2.5 % ophthalmic solution Place 1 drop into the left eye daily as needed for dry eyes.    ? lidocaine-prilocaine (EMLA) cream Apply 1 application topically as needed. Apply small amount to port site at least 1 hour prior to it being accessed, cover with plastic wrap 30 g 1  ? Multiple Vitamin (MULTIVITAMIN) tablet Take 1 tablet by mouth daily.    ? pravastatin (PRAVACHOL) 80 MG tablet Take 80 mg by mouth at bedtime.     ? saw palmetto 160 MG capsule Take 160 mg by mouth 2 (two) times daily.    ? ?No current facility-administered medications for this visit.  ? ?Facility-Administered Medications Ordered in Other Visits  ?Medication Dose Route Frequency Provider Last Rate Last Admin  ? heparin lock flush 100 UNIT/ML injection           ? sodium chloride flush (NS) 0.9 % injection 10 mL  10 mL Intravenous PRN [name], MD   10 mL at 02/11/21 0835  ? ?PHYSICAL EXAMINATION: ?ECOG PERFORMANCE STATUS: 1 - Symptomatic but completely ambulatory ? ?Vitals:  ? 12/17/21 1036  ?BP: 135/62  ?Pulse: 60  ?Temp: (!) 97 ?F (36.1 ?C)  ?Sp

## 2021-12-21 DIAGNOSIS — Z510 Encounter for antineoplastic radiation therapy: Secondary | ICD-10-CM | POA: Diagnosis not present

## 2021-12-23 ENCOUNTER — Other Ambulatory Visit: Payer: Self-pay | Admitting: *Deleted

## 2021-12-23 ENCOUNTER — Ambulatory Visit
Admission: RE | Admit: 2021-12-23 | Discharge: 2021-12-23 | Disposition: A | Discharge status: Home / Self Care | Payer: Medicare Other | Source: Ambulatory Visit | Attending: Radiation Oncology | Admitting: Radiation Oncology

## 2021-12-23 DIAGNOSIS — Z510 Encounter for antineoplastic radiation therapy: Secondary | ICD-10-CM | POA: Diagnosis not present

## 2021-12-23 MED ORDER — DEXAMETHASONE 4 MG PO TABS
4.0000 mg | ORAL_TABLET | Freq: Two times a day (BID) | ORAL | 0 refills | Status: DC
Start: 2021-12-23 — End: 2022-01-05

## 2021-12-30 IMAGING — CR DG CHEST 2V
1 series · 2 of 2 positions shown · non-contrast
Comparison: CT chest 05/20/2021.  Chest radiograph 02/11/2021.

CLINICAL DATA: Cancer of lower lobe of left lung. Recurrent left
pleural effusion. Left pleural effusion.

EXAM:
CHEST - 2 VIEW

[Series 1: dg chest 2 view · 0.14mm/px · 2 of 2 slices shown]
[im 1/2]
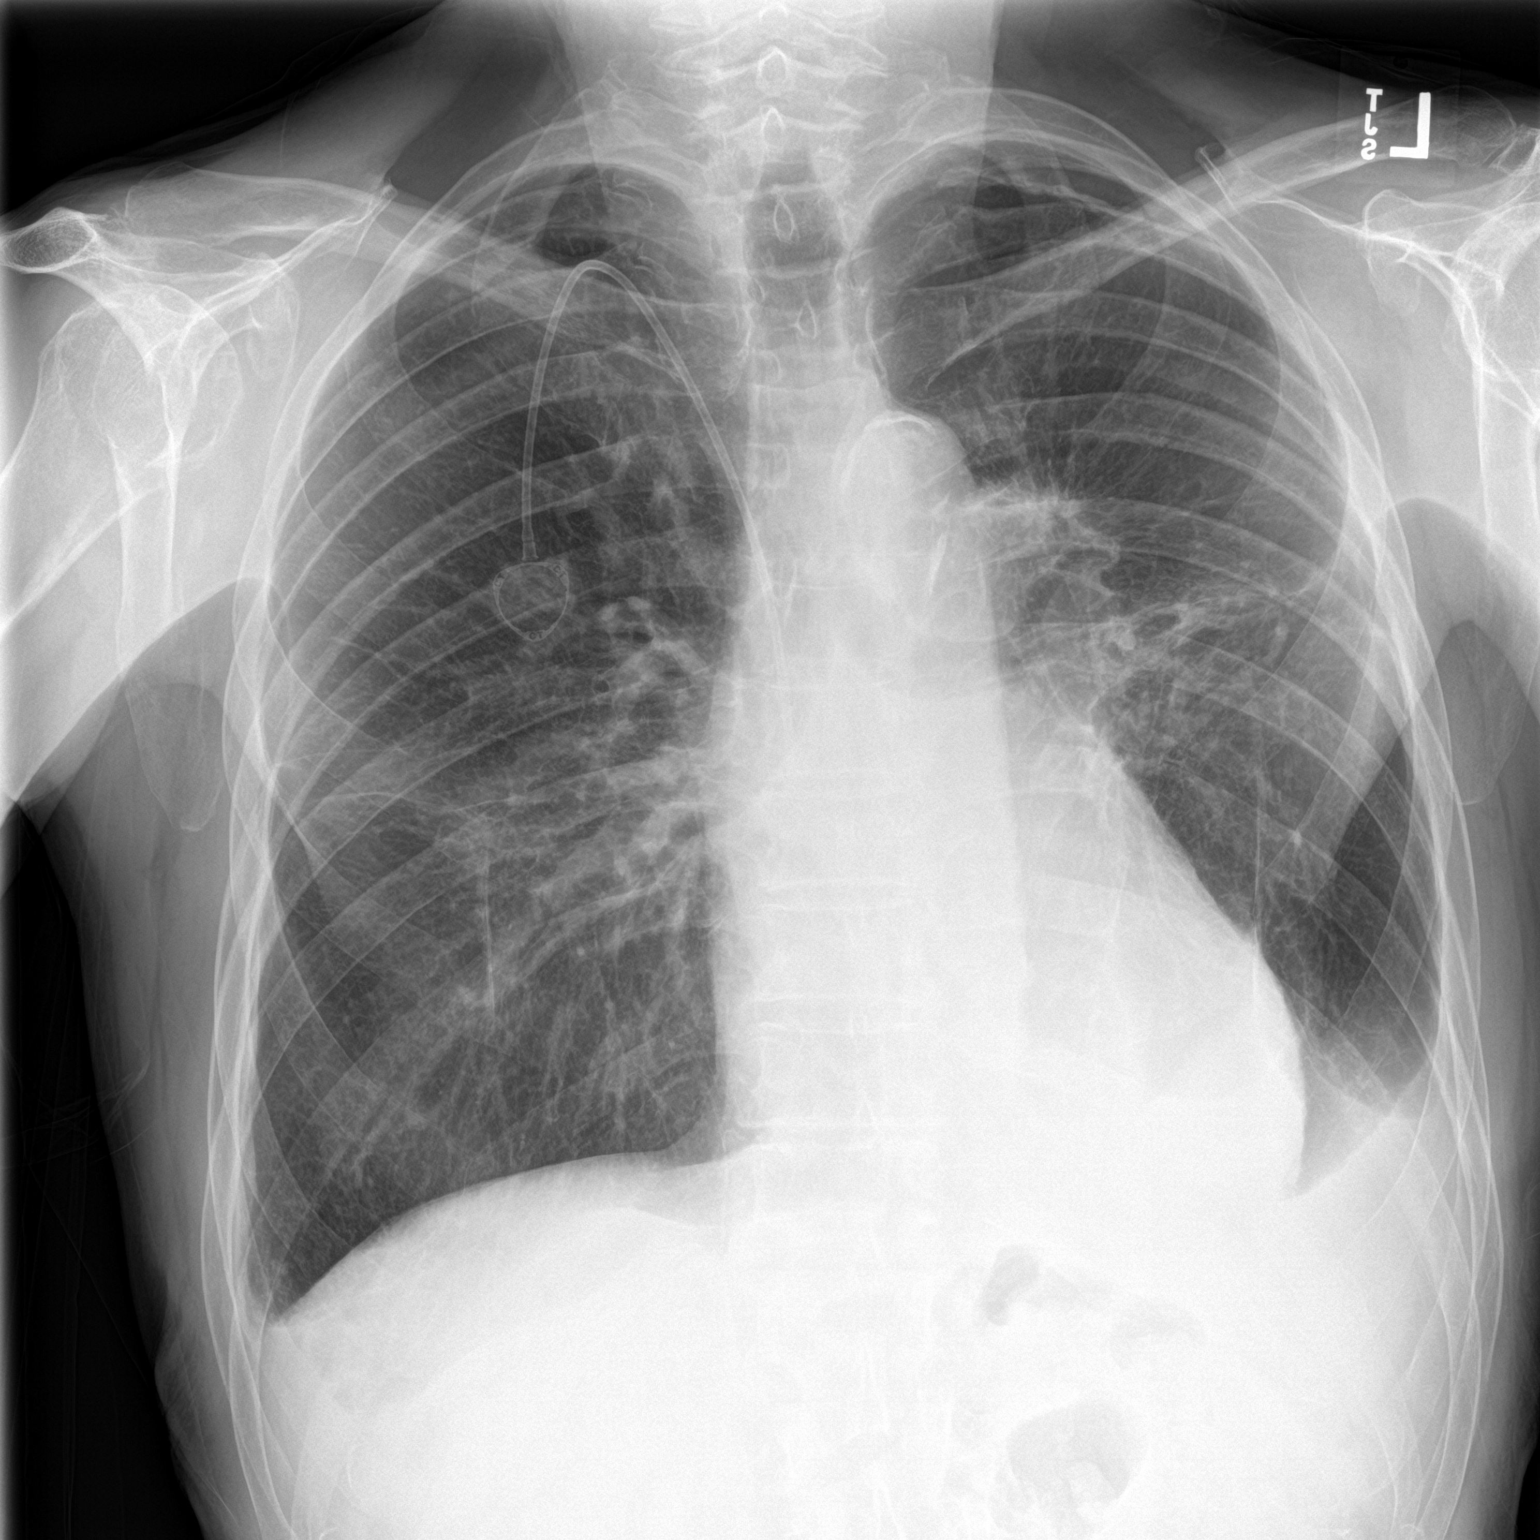
[im 2/2]
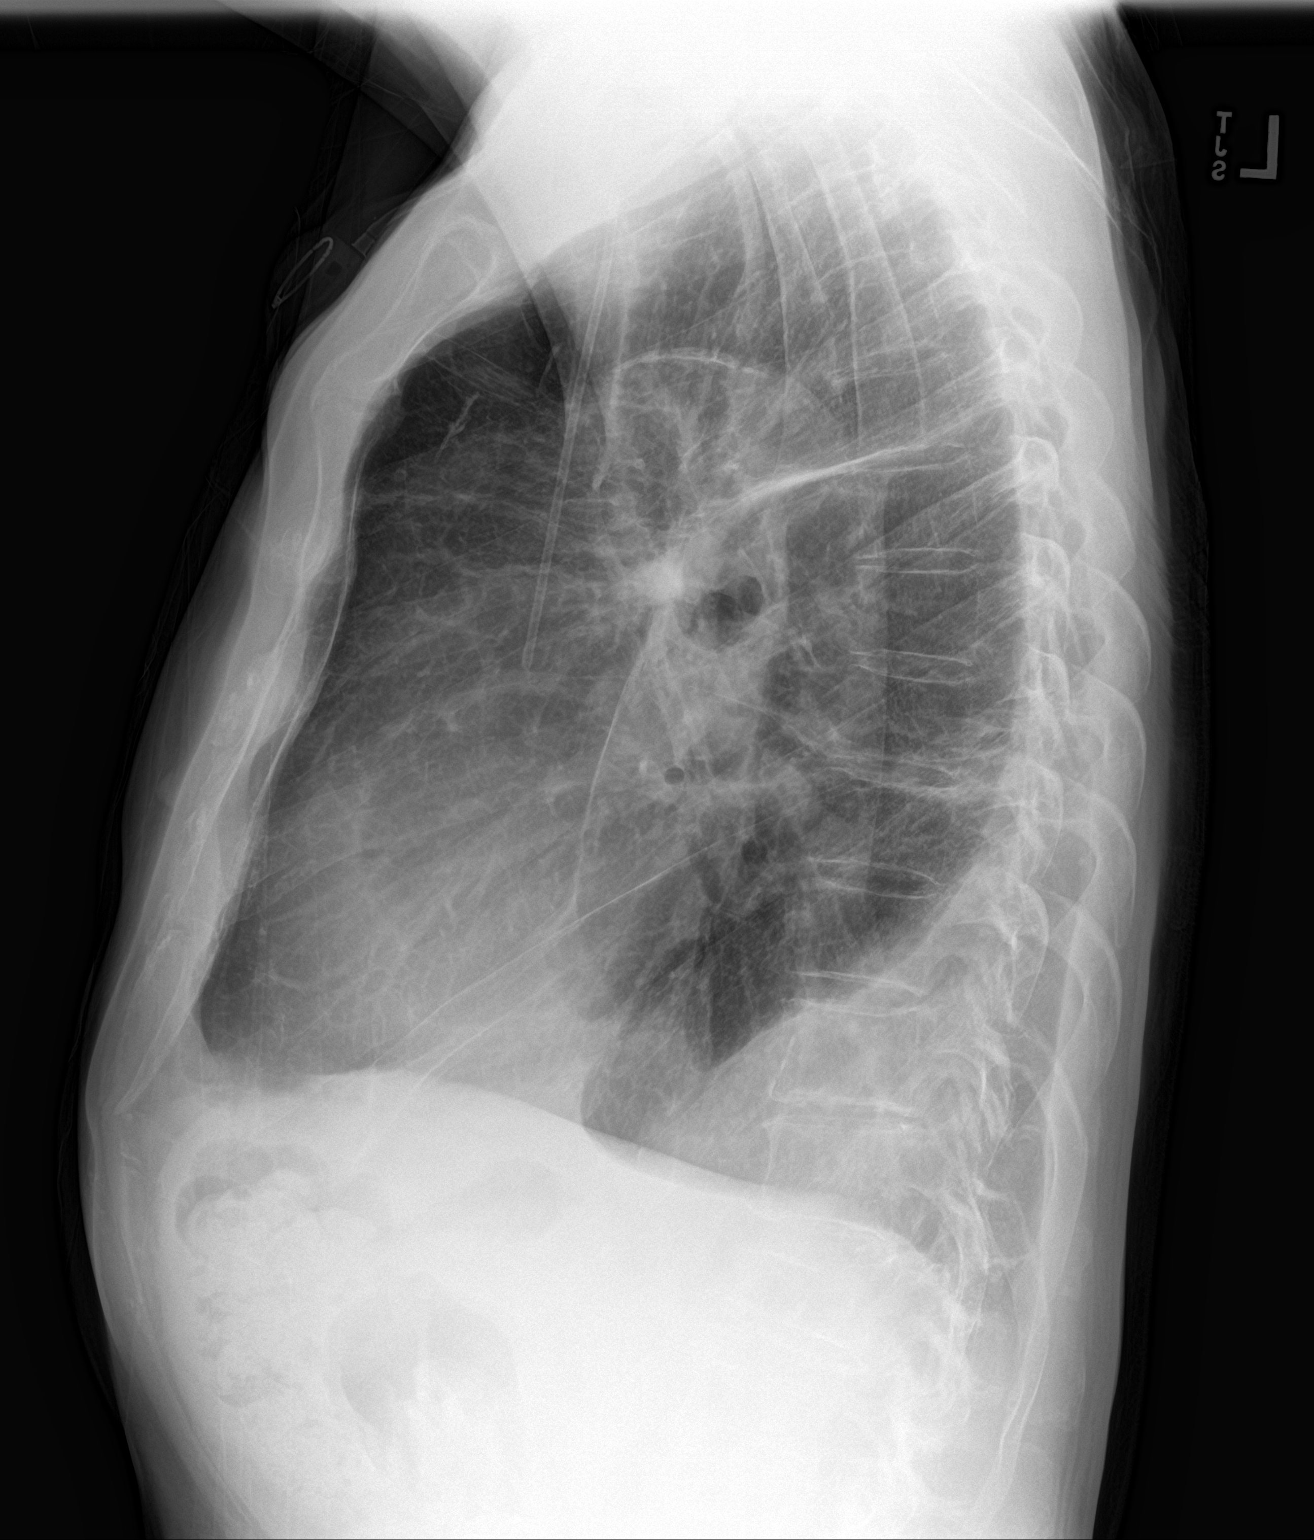

[2 of 2 positions shown; findings below may reference images not displayed]

FINDINGS: Unchanged position of a right chest infusion port catheter with tip
projecting at the level of the lower SVC. Heart size within normal
limits. Aortic atherosclerosis. Scarring and airspace opacities
within the mid lungs bilaterally, not appreciably changed from the
chest radiograph of 02/11/2021. A small left pleural effusion is
similar to minimally increased in size as compared to this prior
examination. Trace right pleural effusion. No evidence of
pneumothorax. No acute bony abnormality identified.
IMPRESSION: Small left pleural effusion, similar to minimally increased in size
from the chest CT of 05/20/2021 and from the chest radiograph of
02/11/2021. A trace right pleural effusion is also present.

Scarring and airspace opacities within the mid lungs bilaterally,
not appreciably changed from the prior chest radiograph of
02/11/2021.

Aortic Atherosclerosis (EDOAK-[id number]).

## 2022-01-02 ENCOUNTER — Other Ambulatory Visit: Payer: Self-pay

## 2022-01-02 ENCOUNTER — Inpatient Hospital Stay
Admission: EM | Admit: 2022-01-02 | Discharge: 2022-01-05 | DRG: 480 | Disposition: A | Discharge status: Home Health | Payer: Medicare Other | Attending: Internal Medicine | Admitting: Internal Medicine

## 2022-01-02 ENCOUNTER — Observation Stay: Payer: Medicare Other

## 2022-01-02 ENCOUNTER — Encounter: Payer: Self-pay | Admitting: Emergency Medicine

## 2022-01-02 ENCOUNTER — Emergency Department: Payer: Medicare Other

## 2022-01-02 DIAGNOSIS — Z96642 Presence of left artificial hip joint: Secondary | ICD-10-CM | POA: Diagnosis present

## 2022-01-02 DIAGNOSIS — M150 Primary generalized (osteo)arthritis: Secondary | ICD-10-CM

## 2022-01-02 DIAGNOSIS — Y92009 Unspecified place in unspecified non-institutional (private) residence as the place of occurrence of the external cause: Secondary | ICD-10-CM

## 2022-01-02 DIAGNOSIS — Z801 Family history of malignant neoplasm of trachea, bronchus and lung: Secondary | ICD-10-CM

## 2022-01-02 DIAGNOSIS — C3492 Malignant neoplasm of unspecified part of left bronchus or lung: Secondary | ICD-10-CM | POA: Diagnosis present

## 2022-01-02 DIAGNOSIS — Z923 Personal history of irradiation: Secondary | ICD-10-CM

## 2022-01-02 DIAGNOSIS — Z9221 Personal history of antineoplastic chemotherapy: Secondary | ICD-10-CM

## 2022-01-02 DIAGNOSIS — S72001A Fracture of unspecified part of neck of right femur, initial encounter for closed fracture: Secondary | ICD-10-CM | POA: Diagnosis present

## 2022-01-02 DIAGNOSIS — Y92010 Kitchen of single-family (private) house as the place of occurrence of the external cause: Secondary | ICD-10-CM

## 2022-01-02 DIAGNOSIS — Z85828 Personal history of other malignant neoplasm of skin: Secondary | ICD-10-CM

## 2022-01-02 DIAGNOSIS — C7931 Secondary malignant neoplasm of brain: Secondary | ICD-10-CM | POA: Diagnosis present

## 2022-01-02 DIAGNOSIS — M7989 Other specified soft tissue disorders: Secondary | ICD-10-CM | POA: Diagnosis present

## 2022-01-02 DIAGNOSIS — W010XXA Fall on same level from slipping, tripping and stumbling without subsequent striking against object, initial encounter: Secondary | ICD-10-CM | POA: Diagnosis present

## 2022-01-02 DIAGNOSIS — Z87891 Personal history of nicotine dependence: Secondary | ICD-10-CM

## 2022-01-02 DIAGNOSIS — E860 Dehydration: Secondary | ICD-10-CM | POA: Diagnosis present

## 2022-01-02 DIAGNOSIS — M25551 Pain in right hip: Secondary | ICD-10-CM | POA: Diagnosis not present

## 2022-01-02 DIAGNOSIS — R42 Dizziness and giddiness: Secondary | ICD-10-CM

## 2022-01-02 DIAGNOSIS — W19XXXA Unspecified fall, initial encounter: Secondary | ICD-10-CM

## 2022-01-02 DIAGNOSIS — S72141A Displaced intertrochanteric fracture of right femur, initial encounter for closed fracture: Secondary | ICD-10-CM | POA: Diagnosis not present

## 2022-01-02 DIAGNOSIS — M79644 Pain in right finger(s): Secondary | ICD-10-CM | POA: Diagnosis present

## 2022-01-02 DIAGNOSIS — G936 Cerebral edema: Secondary | ICD-10-CM | POA: Diagnosis present

## 2022-01-02 DIAGNOSIS — I2510 Atherosclerotic heart disease of native coronary artery without angina pectoris: Secondary | ICD-10-CM | POA: Diagnosis present

## 2022-01-02 DIAGNOSIS — Z7901 Long term (current) use of anticoagulants: Secondary | ICD-10-CM

## 2022-01-02 DIAGNOSIS — J449 Chronic obstructive pulmonary disease, unspecified: Secondary | ICD-10-CM | POA: Diagnosis present

## 2022-01-02 DIAGNOSIS — C3432 Malignant neoplasm of lower lobe, left bronchus or lung: Secondary | ICD-10-CM | POA: Diagnosis present

## 2022-01-02 DIAGNOSIS — S0101XA Laceration without foreign body of scalp, initial encounter: Secondary | ICD-10-CM | POA: Diagnosis present

## 2022-01-02 DIAGNOSIS — Z808 Family history of malignant neoplasm of other organs or systems: Secondary | ICD-10-CM

## 2022-01-02 DIAGNOSIS — I4819 Other persistent atrial fibrillation: Secondary | ICD-10-CM | POA: Diagnosis present

## 2022-01-02 LAB — CBC WITH DIFFERENTIAL/PLATELET
Abs Immature Granulocytes: 0.13 10*3/uL — ABNORMAL HIGH (ref 0.00–0.07)
Basophils Absolute: 0 10*3/uL (ref 0.0–0.1)
Basophils Relative: 0 %
Eosinophils Absolute: 0.1 10*3/uL (ref 0.0–0.5)
Eosinophils Relative: 1 %
HCT: 43.4 % (ref 39.0–52.0)
Hemoglobin: 14.8 g/dL (ref 13.0–17.0)
Immature Granulocytes: 2 %
Lymphocytes Relative: 14 %
Lymphs Abs: 1.2 10*3/uL (ref 0.7–4.0)
MCH: 32.5 pg (ref 26.0–34.0)
MCHC: 34.1 g/dL (ref 30.0–36.0)
MCV: 95.2 fL (ref 80.0–100.0)
Monocytes Absolute: 0.6 10*3/uL (ref 0.1–1.0)
Monocytes Relative: 7 %
Neutro Abs: 6.6 10*3/uL (ref 1.7–7.7)
Neutrophils Relative %: 76 %
Platelets: 150 10*3/uL (ref 150–400)
RBC: 4.56 MIL/uL (ref 4.22–5.81)
RDW: 14.9 % (ref 11.5–15.5)
WBC: 8.6 10*3/uL (ref 4.0–10.5)
nRBC: 0 % (ref 0.0–0.2)

## 2022-01-02 LAB — BASIC METABOLIC PANEL
Anion gap: 7 (ref 5–15)
BUN: 33 mg/dL — ABNORMAL HIGH (ref 8–23)
CO2: 25 mmol/L (ref 22–32)
Calcium: 8.2 mg/dL — ABNORMAL LOW (ref 8.9–10.3)
Chloride: 101 mmol/L (ref 98–111)
Creatinine, Ser: 1.24 mg/dL (ref 0.61–1.24)
GFR, Estimated: 55 mL/min — ABNORMAL LOW (ref >60)
Glucose, Bld: 111 mg/dL — ABNORMAL HIGH (ref 70–99)
Potassium: 4.3 mmol/L (ref 3.5–5.1)
Sodium: 133 mmol/L — ABNORMAL LOW (ref 135–145)

## 2022-01-02 MED ORDER — POLYETHYLENE GLYCOL 3350 17 G PO PACK: 17.0000 g | PACK | Freq: Every day | ORAL | Status: DC | PRN

## 2022-01-02 MED ORDER — DEXAMETHASONE 0.5 MG PO TABS
2.0000 mg | ORAL_TABLET | Freq: Every day | ORAL | Status: DC
  Filled 2022-01-02 (×3): qty 4

## 2022-01-02 MED ORDER — METHOCARBAMOL 1000 MG/10ML IJ SOLN
500.0000 mg | Freq: Four times a day (QID) | INTRAVENOUS | Status: DC | PRN
  Filled 2022-01-02: qty 5

## 2022-01-02 MED ORDER — MORPHINE SULFATE (PF) 4 MG/ML IV SOLN
4.0000 mg | Freq: Once | INTRAVENOUS | Status: AC
  Administered 2022-01-02: 4 mg via INTRAVENOUS
  Filled 2022-01-02: qty 1

## 2022-01-02 MED ORDER — DOCUSATE SODIUM 100 MG PO CAPS
100.0000 mg | ORAL_CAPSULE | Freq: Two times a day (BID) | ORAL | Status: DC
  Administered 2022-01-02 – 2022-01-04 (×5): 100 mg via ORAL
  Filled 2022-01-02 (×6): qty 1

## 2022-01-02 MED ORDER — FENTANYL CITRATE PF 50 MCG/ML IJ SOSY
50.0000 ug | PREFILLED_SYRINGE | Freq: Once | INTRAMUSCULAR | Status: AC
  Administered 2022-01-02: 50 ug via INTRAVENOUS
  Filled 2022-01-02: qty 1

## 2022-01-02 MED ORDER — SODIUM CHLORIDE 0.9 % IV SOLN: INTRAVENOUS | Status: DC

## 2022-01-02 MED ORDER — HYDROMORPHONE HCL 1 MG/ML IJ SOLN
1.0000 mg | Freq: Once | INTRAMUSCULAR | Status: AC
Start: 2022-01-02 — End: 2022-01-02
  Administered 2022-01-02: 1 mg via INTRAVENOUS
  Filled 2022-01-02: qty 1

## 2022-01-02 MED ORDER — METHOCARBAMOL 500 MG PO TABS
500.0000 mg | ORAL_TABLET | Freq: Four times a day (QID) | ORAL | Status: DC | PRN
  Administered 2022-01-02: 500 mg via ORAL
  Filled 2022-01-02 (×2): qty 1

## 2022-01-02 MED ORDER — HYDROMORPHONE HCL 1 MG/ML IJ SOLN
1.0000 mg | Freq: Four times a day (QID) | INTRAMUSCULAR | Status: AC | PRN
  Administered 2022-01-03: 1 mg via INTRAVENOUS
  Filled 2022-01-02: qty 1

## 2022-01-02 MED ORDER — BISACODYL 5 MG PO TBEC: 5.0000 mg | DELAYED_RELEASE_TABLET | Freq: Every day | ORAL | Status: DC | PRN

## 2022-01-02 MED ORDER — MORPHINE SULFATE (PF) 2 MG/ML IV SOLN
2.0000 mg | Freq: Once | INTRAVENOUS | Status: AC
  Administered 2022-01-02: 2 mg via INTRAVENOUS
  Filled 2022-01-02: qty 1

## 2022-01-02 MED ORDER — CEFAZOLIN SODIUM-DEXTROSE 2-4 GM/100ML-% IV SOLN: 2.0000 g | INTRAVENOUS | Status: DC

## 2022-01-02 NOTE — ED Triage Notes (Addendum)
Pt coming from home via [facility]. Per EMS pt was cooking and lost his balance and hit his head on the kitchen cabinet knob. Pt denies LOC. Pt does take Eliquis.  ? ?Pt has a small abrasion on the right side of his head. ?

## 2022-01-02 NOTE — ED Notes (Signed)
[place], light green, blue, red top tubes sent to lab. ?

## 2022-01-02 NOTE — H&P (Signed)
?History and Physical  ? ? ?Patient: Jason Warren DOB: [birth year] ?DOA: 01/02/2022 ?DOS: the patient was seen and examined on 01/03/2022 ?PCP: Baxter Hire, MD  ?Patient coming from: Home ? ?Chief Complaint:  ?Chief Complaint  ?Patient presents with  ? Fall  ? ?HPI: Jason Warren is a 88 y.o. male with medical history significant of lung cancer with mets presenting with fall today. He is hard of hearing and family at bedside is giving history.Although it is unclear if he has syncopal episode.  ?Pt was cooking when he fell and earlier today he was at PCP office for pain and swelling of his right 3rd MCP has been happening for off and on for past 3-4 weeks no trauma for which pt was given doxycyline and meloxicam.  ?So far in ed pt has received morphine and fentanyl. ? 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 Patient has history of A-fib and is on Eliquis and sees Dr. Nehemiah Massed last echocardiogram in 2020 to February showed ejection fraction of 55 to 60%.  Patient has Mediport for access. ? ?Review of Systems: Review of Systems  ?Musculoskeletal:  Positive for joint pain.  ?Neurological:  Positive for dizziness and loss of consciousness.  ?All other systems reviewed and are negative. ? ?Past Medical History:  ?Diagnosis Date  ? Arthritis   ? Dysrhythmia   ? A-fib  ? Hip pain   ? left  ? HOH (hard of hearing)   ? LBBB (left bundle branch block) 08/05/2020  ? Lung cancer ([place])   ? Skin cancer, basal cell   ?  face top of head  ? ?Past Surgical History:  ?Procedure Laterality Date  ? CATARACT EXTRACTION W/PHACO Right 07/13/2017  ?  Procedure: CATARACT EXTRACTION PHACO AND INTRAOCULAR LENS PLACEMENT (IOC);  Surgeon: Leandrew Koyanagi, MD;  Location: [facility];  Service: Ophthalmology;  Laterality: Right;  IVA TOPICAL ?RIGHT  ? CATARACT EXTRACTION W/PHACO Left 01/04/2018  ? Procedure: CATARACT EXTRACTION PHACO AND INTRAOCULAR LENS PLACEMENT ([place]) LEFT;  Surgeon: Leandrew Koyanagi, MD;  Location: [facility];  Service: Ophthalmology;  Laterality: Left;  ? COLONOSCOPY    ? IR IMAGING GUIDED PORT INSERTION  08/27/2020  ? JOINT REPLACEMENT    ? Left total hip Dr. Su Hoff 08-04-18  ? ROTATOR CUFF REPAIR Right   ? SKIN CANCER EXCISION    ? face  ? TOTAL HIP ARTHROPLASTY Left 08/04/2018  ? Procedure: TOTAL HIP ARTHROPLASTY ANTERIOR APPROACH;  Surgeon: Frederik Pear, MD;  [facility];  Service: Orthopedics;  Laterality: Left;  ? VIDEO BRONCHOSCOPY WITH ENDOBRONCHIAL NAVIGATION N/A 08/07/2020  ? Procedure: VIDEO BRONCHOSCOPY WITH ENDOBRONCHIAL NAVIGATION;  Surgeon: Ottie Glazier, MD;  Location: [facility];  Service: Thoracic;  Laterality: N/A;  ? VIDEO BRONCHOSCOPY WITH ENDOBRONCHIAL ULTRASOUND N/A 08/07/2020  ? Procedure: VIDEO BRONCHOSCOPY WITH ENDOBRONCHIAL ULTRASOUND;  Surgeon: Ottie Glazier, MD;  Location: [facility];  Service: Thoracic;  Laterality: N/A;  ? ?Social History:  reports that he has quit smoking. His smoking use included cigarettes. He has a 4.00 pack-year smoking history. He has never used smokeless tobacco. He reports that he does not drink alcohol  and does not use drugs. ? ?No Known Allergies ? ?Family History  ?Problem Relation Age of Onset  ? Throat cancer Brother   ?      & lung cancer  ? ? ?Prior to Admission medications   ?Medication Sig Start Date End Date Taking? Authorizing Provider  ?acetaminophen (TYLENOL) 500 MG tablet Take 500 mg by mouth every 8 (eight) hours as needed for moderate pain.    [provider]  ?apixaban (ELIQUIS) 5 MG TABS tablet Take 5 mg by mouth 2 (two) times daily.     [provider]  ?dexamethasone (DECADRON) 2 MG tablet Take 1 tablet (2 mg total) by mouth daily. 12/03/21   Borders, Kirt Boys, NP  ?dexamethasone (DECADRON) 4 MG tablet Take 1 tablet (4 mg total) by mouth 2 (two) times daily. 4 mg BID x3 days, 4 mg daily x 3 days, 2 mg daily x 3 days 12/23/21   Noreene Filbert, MD  ?hydroxypropyl methylcellulose / hypromellose (ISOPTO TEARS / GONIOVISC) 2.5 % ophthalmic solution Place 1 drop into the left eye daily as needed for dry eyes.    [provider]  ?lidocaine-prilocaine (EMLA) cream Apply 1 application topically as needed. Apply small amount to port site at least 1 hour prior to it being accessed, cover with plastic wrap 08/22/20   [name], MD  ?Multiple Vitamin (MULTIVITAMIN) tablet Take 1 tablet by mouth daily.    [provider]  ?pravastatin (PRAVACHOL) 80 MG tablet Take 80 mg by mouth at bedtime.     [provider]  ?saw palmetto 160 MG capsule Take 160 mg by mouth 2 (two) times daily.    [provider]  ? ? ?Physical Exam: ?Vitals:  ? 01/02/22 2030 01/02/22 2100 01/02/22 2200 01/02/22 2240  ?BP: 123/63 115/61 123/69 (!) 141/89  ?Pulse: 72 70 77 68  ?Resp: (!) 23 (!) 23 20 20   ?Temp:   99.1 ?F (37.3 ?C) 98.5 ?F (36.9 ?C)  ?TempSrc:   Oral   ?SpO2: 100% 100% 98% 96%  ?Weight:      ?Height:      ?Physical Exam ?Vitals and nursing note reviewed.  ?Constitutional:   ?   General: He is not in acute distress. ?   Appearance: Normal appearance. He is not ill-appearing, toxic-appearing or diaphoretic.  ?HENT:  ?   Head: Normocephalic and atraumatic.  ?   Right Ear: Hearing and external ear normal.  ?   Left Ear: Hearing and external ear normal.  ?   Nose: Nose normal. No nasal deformity.  ?   Mouth/Throat:  ?   Lips: Pink.  ?   Mouth: Mucous membranes are moist.  ?   Tongue: No lesions.  ?   Pharynx: Oropharynx is clear.  ?Eyes:  ?   Extraocular Movements: Extraocular movements intact.  ?   Pupils: Pupils are equal,  round, and reactive to light.  ?Neck:  ?   Vascular: No carotid bruit.  ?Cardiovascular:  ?   Rate and Rhythm: Normal rate. Rhythm irregular.  ?   Pulses: Normal pulses.     ?     Dorsalis pedis pulses are 2+ on the right side and 2+ on the left side.  ?   Heart sounds: Normal heart sounds.  ?Pulmonary:  ?   Effort: Pulmonary effort is normal.  ?   Breath sounds: Normal breath sounds.  ?Abdominal:  ?   General: Bowel sounds are normal. There is no distension.  ?  Palpations: Abdomen is soft. There is no mass.  ?   Tenderness: There is no abdominal tenderness. There is no guarding.  ?   Hernia: No hernia is present.  ?Musculoskeletal:  ?   Right lower leg: No edema.  ?   Left lower leg: No edema.  ?     Legs: ? ?Skin: ?   General: Skin is warm.  ?Neurological:  ?   General: No focal deficit present.  ?   Mental Status: He is alert and oriented to person, place, and time.  ?   Cranial Nerves: Cranial nerves 2-12 are intact.  ?   Motor: Motor function is intact.  ?Psychiatric:     ?   Attention and Perception: Attention normal.     ?   Mood and Affect: Mood normal.     ?   Speech: Speech normal.     ?   Behavior: Behavior normal. Behavior is cooperative.     ?   Cognition and Memory: Cognition normal.  ? ? ?Data Reviewed: ?Results for orders placed or performed during the hospital encounter of 01/02/22 (from the past 24 hour(s))  ?CBC with Differential     Status: Abnormal  ? Collection Time: 01/02/22  6:32 PM  ?Result Value Ref Range  ? WBC 8.6 4.0 - 10.5 K/uL  ? RBC 4.56 4.22 - 5.81 MIL/uL  ? Hemoglobin 14.8 13.0 - 17.0 g/dL  ? HCT 43.4 39.0 - 52.0 %  ? MCV 95.2 80.0 - 100.0 fL  ? MCH 32.5 26.0 - 34.0 pg  ? MCHC 34.1 30.0 - 36.0 g/dL  ? RDW 14.9 11.5 - 15.5 %  ? Platelets 150 150 - 400 K/uL  ? nRBC 0.0 0.0 - 0.2 %  ? Neutrophils Relative % 76 %  ? Neutro Abs 6.6 1.7 - 7.7 K/uL  ? Lymphocytes Relative 14 %  ? Lymphs Abs 1.2 0.7 - 4.0 K/uL  ? Monocytes Relative 7 %  ? Monocytes Absolute 0.6 0.1 - 1.0 K/uL  ?  Eosinophils Relative 1 %  ? Eosinophils Absolute 0.1 0.0 - 0.5 K/uL  ? Basophils Relative 0 %  ? Basophils Absolute 0.0 0.0 - 0.1 K/uL  ? Immature Granulocytes 2 %  ? Abs Immature Granulocytes 0.13 (H) 0.00 - 0.07 K/uL

## 2022-01-02 NOTE — Assessment & Plan Note (Addendum)
-   Swelling noted on CT of hand.  He had been on doxycycline outpatient and NSAIDs.  No fracture or infection appreciated on CT hand ?- Follow-up any further orthopedic recommendations; okay to keep fingers splinted for comfort ?- Discontinue antibiotics and monitor ?

## 2022-01-02 NOTE — ED Provider Notes (Signed)
? ?[facility] ?Provider Note ? ? ? Event Date/Time  ? First MD Initiated Contact with Patient 01/02/22 1843   ?  (approximate) ? ? ?History  ? ?Fall ? ? ?HPI ? ?Jason Warren is a 88 y.o. male  who, per outpatient clinic note dated from 12/11/21 has history of atrial fibrillation, cancer, COPD, who presents to the emergency department today after a fall.  The patient states that he was bending over to get something from a cabinet when he fell.  He denies any chest pain or palpitations during the fall.  He did hit his head against the cabinets but then landed on his right side.  Complaining primarily of right hip pain.  Has history of left hip replacement secondary to arthritis. Patient states he last took a dose of his eliquis this morning.   ? ? ?Physical Exam  ? ?Triage Vital Signs: ?ED Triage Vitals  ?Enc Vitals Group  ?   BP 01/02/22 1835 132/61  ?   Pulse Rate 01/02/22 1835 69  ?   Resp 01/02/22 1835 20  ?   Temp 01/02/22 1835 98.3 ?F (36.8 ?C)  ?   Temp Source 01/02/22 1835 Oral  ?   SpO2 01/02/22 1835 100 %  ?   Weight 01/02/22 1836 160 lb (72.6 kg)  ?   Height 01/02/22 1836 6' (1.829 m)  ?   Head Circumference --   ?   Peak Flow --   ?   Pain Score 01/02/22 1836 5  ? ?Most recent vital signs: ?Vitals:  ? 01/02/22 1835  ?BP: 132/61  ?Pulse: 69  ?Resp: 20  ?Temp: 98.3 ?F (36.8 ?C)  ?SpO2: 100%  ? ? ?General: Awake, no distress.  ?CV:  Good peripheral perfusion. Irregular rhythm. ?Resp:  Normal effort. Clear to ausculation. ?Abd:  No distention.  ?MSK:  Right leg shortened and externally rotated. Right hip tender to manipulation. ?Skin:  Multiple small skin tears to scalp. ? ? ?ED Results / Procedures / Treatments  ? ?Labs ?(all labs ordered are listed, but only abnormal results are displayed) ?Labs Reviewed  ?CBC WITH DIFFERENTIAL/PLATELET - Abnormal; Notable for the following components:  ?    Result Value  ? Abs Immature Granulocytes 0.13 (*)   ? All other components within normal  limits  ?BASIC METABOLIC PANEL - Abnormal; Notable for the following components:  ? Sodium 133 (*)   ? Glucose, Bld 111 (*)   ? BUN 33 (*)   ? Calcium 8.2 (*)   ? GFR, Estimated 55 (*)   ? All other components within normal limits  ? ? ? ?EKG ? ?INance Pear, attending physician, personally viewed and interpreted this EKG ? ?EKG Time: 1833 ?Rate: 82 ?Rhythm: atrial fibrillation ?Axis: normal ?Intervals: qtc 519 ?QRS: narrow ?ST changes: no st elevation ?Impression: abnormal ekg ? ? ?RADIOLOGY ?I independently interpreted and visualized the right hip x-ray. My interpretation: acute hip fracture ?Radiology interpretation:  ?IMPRESSION:  ?Comminuted, angulated intratrochanteric right hip fracture with  ?avulsion of the lesser trochanter.  ? ?CT head/cervical spine ?IMPRESSION:  ?1. Stable appearance of known metastatic lesion within the high  ?right frontal lobe with surrounding vasogenic edema. Otherwise, no  ?acute intracranial abnormality.  ?2. No acute fracture or subluxation of the cervical spine.  ? ?CXR ?IMPRESSION:  ?Chronic scarring in the left perihilar region consistent with post  ?therapeutic change. Mild scar carotic atelectasis in the right mid  ?to lower lung. No acute abnormalities.  ?   ? ? ? ?  PROCEDURES: ? ?Critical Care performed: No ? ?Procedures ? ? ?MEDICATIONS ORDERED IN ED: ?Medications - No data to display ? ? ?IMPRESSION / MDM / ASSESSMENT AND PLAN / ED COURSE  ?I reviewed the triage vital signs and the nursing notes. ?             ?               ? ?Differential diagnosis includes, but is not limited to, right hip fracture, right hip dislocation, bursitis, intracranial bleed. ? ?Patient presented to the emergency department today with primary concern for right hip pain after a fall.  On exam he is tender to manipulation of that right hip.  X-rays consistent with a right hip fracture.  Discussed with Dr. Wilhelmenia Blase with orthopedics.  Did obtain CT scan of his head given fall and history of  Eliquis use.  Does not show any acute intracranial bleed or acute osseous abnormality.  Discussed with Dr. Posey Pronto with the hospitalist service who will plan on admission. ? ?FINAL CLINICAL IMPRESSION(S) / ED DIAGNOSES  ? ?Final diagnoses:  ?Closed fracture of right hip, initial encounter ([place])  ? ? ? ?Note:  This document was prepared using Dragon voice recognition software and may include unintentional dictation errors. ? ?  ?Nance Pear, MD ?01/02/22 2124 ? ?

## 2022-01-02 NOTE — Assessment & Plan Note (Addendum)
-   Rate controlled, no medication noted on med rec ?-Eliquis resumed on 01/04/2022 ? ?

## 2022-01-02 NOTE — Assessment & Plan Note (Addendum)
-   Possibly related to volume depletion however may also have some contribution from his right frontal brain metastasis with vasogenic edema.   ?- Continue working with PT ? ? ?

## 2022-01-02 NOTE — Assessment & Plan Note (Addendum)
-   Continue fluids given concern for some possible dehydration on admission ?- Continue on telemetry ?

## 2022-01-02 NOTE — ED Notes (Signed)
Patient transported to CT 

## 2022-01-02 NOTE — Assessment & Plan Note (Addendum)
-   S/p mechanical fall at home ?- Imaging shows comminuted angulated intertrochanteric right hip fracture with avulsion of the lesser trochanter ?- Evaluated by orthopedic surgery and underwent ORIF on 01/03/2022 ?

## 2022-01-03 ENCOUNTER — Observation Stay: Payer: Medicare Other

## 2022-01-03 ENCOUNTER — Other Ambulatory Visit: Payer: Self-pay

## 2022-01-03 ENCOUNTER — Observation Stay: Payer: Medicare Other | Admitting: Certified Registered"

## 2022-01-03 ENCOUNTER — Encounter
Admission: EM | Disposition: A | Discharge status: Home Health | Payer: Self-pay | Source: Home / Self Care | Attending: Internal Medicine

## 2022-01-03 DIAGNOSIS — S0101XA Laceration without foreign body of scalp, initial encounter: Secondary | ICD-10-CM

## 2022-01-03 DIAGNOSIS — S72001A Fracture of unspecified part of neck of right femur, initial encounter for closed fracture: Secondary | ICD-10-CM

## 2022-01-03 DIAGNOSIS — Z808 Family history of malignant neoplasm of other organs or systems: Secondary | ICD-10-CM | POA: Diagnosis not present

## 2022-01-03 DIAGNOSIS — Z923 Personal history of irradiation: Secondary | ICD-10-CM | POA: Diagnosis not present

## 2022-01-03 DIAGNOSIS — Z96642 Presence of left artificial hip joint: Secondary | ICD-10-CM | POA: Diagnosis present

## 2022-01-03 DIAGNOSIS — W010XXA Fall on same level from slipping, tripping and stumbling without subsequent striking against object, initial encounter: Secondary | ICD-10-CM | POA: Diagnosis present

## 2022-01-03 DIAGNOSIS — G936 Cerebral edema: Secondary | ICD-10-CM | POA: Diagnosis present

## 2022-01-03 DIAGNOSIS — Z801 Family history of malignant neoplasm of trachea, bronchus and lung: Secondary | ICD-10-CM | POA: Diagnosis not present

## 2022-01-03 DIAGNOSIS — M7989 Other specified soft tissue disorders: Secondary | ICD-10-CM | POA: Diagnosis present

## 2022-01-03 DIAGNOSIS — Z7901 Long term (current) use of anticoagulants: Secondary | ICD-10-CM | POA: Diagnosis not present

## 2022-01-03 DIAGNOSIS — C3432 Malignant neoplasm of lower lobe, left bronchus or lung: Secondary | ICD-10-CM | POA: Diagnosis present

## 2022-01-03 DIAGNOSIS — Z87891 Personal history of nicotine dependence: Secondary | ICD-10-CM | POA: Diagnosis not present

## 2022-01-03 DIAGNOSIS — Y92010 Kitchen of single-family (private) house as the place of occurrence of the external cause: Secondary | ICD-10-CM | POA: Diagnosis not present

## 2022-01-03 DIAGNOSIS — S72141A Displaced intertrochanteric fracture of right femur, initial encounter for closed fracture: Secondary | ICD-10-CM | POA: Diagnosis present

## 2022-01-03 DIAGNOSIS — M150 Primary generalized (osteo)arthritis: Secondary | ICD-10-CM | POA: Diagnosis not present

## 2022-01-03 DIAGNOSIS — E860 Dehydration: Secondary | ICD-10-CM | POA: Diagnosis present

## 2022-01-03 DIAGNOSIS — M25551 Pain in right hip: Secondary | ICD-10-CM | POA: Diagnosis present

## 2022-01-03 DIAGNOSIS — J449 Chronic obstructive pulmonary disease, unspecified: Secondary | ICD-10-CM | POA: Diagnosis present

## 2022-01-03 DIAGNOSIS — C7931 Secondary malignant neoplasm of brain: Secondary | ICD-10-CM | POA: Diagnosis present

## 2022-01-03 DIAGNOSIS — Z9221 Personal history of antineoplastic chemotherapy: Secondary | ICD-10-CM | POA: Diagnosis not present

## 2022-01-03 DIAGNOSIS — I2510 Atherosclerotic heart disease of native coronary artery without angina pectoris: Secondary | ICD-10-CM | POA: Diagnosis present

## 2022-01-03 DIAGNOSIS — Z85828 Personal history of other malignant neoplasm of skin: Secondary | ICD-10-CM | POA: Diagnosis not present

## 2022-01-03 DIAGNOSIS — I4819 Other persistent atrial fibrillation: Secondary | ICD-10-CM | POA: Diagnosis present

## 2022-01-03 DIAGNOSIS — C3492 Malignant neoplasm of unspecified part of left bronchus or lung: Secondary | ICD-10-CM | POA: Diagnosis not present

## 2022-01-03 HISTORY — PX: INTRAMEDULLARY (IM) NAIL INTERTROCHANTERIC: SHX5875

## 2022-01-03 HISTORY — PX: FEMUR FRACTURE SURGERY: SHX633

## 2022-01-03 LAB — BASIC METABOLIC PANEL
Anion gap: 5 (ref 5–15)
BUN: 26 mg/dL — ABNORMAL HIGH (ref 8–23)
CO2: 27 mmol/L (ref 22–32)
Calcium: 7.9 mg/dL — ABNORMAL LOW (ref 8.9–10.3)
Chloride: 101 mmol/L (ref 98–111)
Creatinine, Ser: 0.94 mg/dL (ref 0.61–1.24)
GFR, Estimated: >60 mL/min (ref >60)
Glucose, Bld: 120 mg/dL — ABNORMAL HIGH (ref 70–99)
Potassium: 4.9 mmol/L (ref 3.5–5.1)
Sodium: 133 mmol/L — ABNORMAL LOW (ref 135–145)

## 2022-01-03 LAB — CBC
HCT: 37.4 % — ABNORMAL LOW (ref 39.0–52.0)
Hemoglobin: 12.8 g/dL — ABNORMAL LOW (ref 13.0–17.0)
MCH: 32.5 pg (ref 26.0–34.0)
MCHC: 34.2 g/dL (ref 30.0–36.0)
MCV: 94.9 fL (ref 80.0–100.0)
Platelets: 142 10*3/uL — ABNORMAL LOW (ref 150–400)
RBC: 3.94 MIL/uL — ABNORMAL LOW (ref 4.22–5.81)
RDW: 14.8 % (ref 11.5–15.5)
WBC: 8.2 10*3/uL (ref 4.0–10.5)
nRBC: 0 % (ref 0.0–0.2)

## 2022-01-03 LAB — TYPE AND SCREEN
ABO/RH(D): O POS
Antibody Screen: NEGATIVE

## 2022-01-03 SURGERY — FIXATION, FRACTURE, INTERTROCHANTERIC, WITH INTRAMEDULLARY ROD
Anesthesia: General | Laterality: Right

## 2022-01-03 MED ORDER — KETOROLAC TROMETHAMINE 15 MG/ML IJ SOLN
INTRAMUSCULAR | Status: AC
  Filled 2022-01-03: qty 1

## 2022-01-03 MED ORDER — DOCUSATE SODIUM 100 MG PO CAPS
100.0000 mg | ORAL_CAPSULE | Freq: Two times a day (BID) | ORAL | Status: DC
  Filled 2022-01-03: qty 1

## 2022-01-03 MED ORDER — ACETAMINOPHEN 325 MG PO TABS
325.0000 mg | ORAL_TABLET | Freq: Four times a day (QID) | ORAL | Status: DC | PRN
  Administered 2022-01-04 – 2022-01-05 (×4): 650 mg via ORAL
  Filled 2022-01-03 (×4): qty 2

## 2022-01-03 MED ORDER — SODIUM CHLORIDE 0.9 % IV SOLN: INTRAVENOUS | Status: AC

## 2022-01-03 MED ORDER — ONDANSETRON HCL 4 MG/2ML IJ SOLN
INTRAMUSCULAR | Status: DC | PRN
  Administered 2022-01-03: 4 mg via INTRAVENOUS

## 2022-01-03 MED ORDER — HYDROMORPHONE HCL 1 MG/ML IJ SOLN
INTRAMUSCULAR | Status: AC
  Filled 2022-01-03: qty 1

## 2022-01-03 MED ORDER — PRAVASTATIN SODIUM 20 MG PO TABS
80.0000 mg | ORAL_TABLET | Freq: Every day | ORAL | Status: DC
  Administered 2022-01-03 – 2022-01-04 (×2): 80 mg via ORAL
  Filled 2022-01-03 (×2): qty 4

## 2022-01-03 MED ORDER — ONDANSETRON HCL 4 MG/2ML IJ SOLN
INTRAMUSCULAR | Status: AC
  Filled 2022-01-03: qty 2

## 2022-01-03 MED ORDER — PROPOFOL 10 MG/ML IV BOLUS
INTRAVENOUS | Status: AC
  Filled 2022-01-03: qty 20

## 2022-01-03 MED ORDER — EPHEDRINE SULFATE (PRESSORS) 50 MG/ML IJ SOLN
INTRAMUSCULAR | Status: DC | PRN
  Administered 2022-01-03: 5 mg via INTRAVENOUS

## 2022-01-03 MED ORDER — FENTANYL CITRATE (PF) 100 MCG/2ML IJ SOLN
INTRAMUSCULAR | Status: AC
  Filled 2022-01-03: qty 2

## 2022-01-03 MED ORDER — DIPHENHYDRAMINE HCL 12.5 MG/5ML PO ELIX: 12.5000 mg | ORAL_SOLUTION | ORAL | Status: DC | PRN

## 2022-01-03 MED ORDER — FLEET ENEMA 7-19 GM/118ML RE ENEM: 1.0000 | ENEMA | Freq: Once | RECTAL | Status: DC | PRN

## 2022-01-03 MED ORDER — BUPIVACAINE-EPINEPHRINE (PF) 0.5% -1:200000 IJ SOLN
INTRAMUSCULAR | Status: DC | PRN
  Administered 2022-01-03: 30 mL via PERINEURAL

## 2022-01-03 MED ORDER — CEFAZOLIN SODIUM-DEXTROSE 2-4 GM/100ML-% IV SOLN
2.0000 g | Freq: Four times a day (QID) | INTRAVENOUS | Status: AC
  Administered 2022-01-03 – 2022-01-04 (×3): 2 g via INTRAVENOUS
  Filled 2022-01-03 (×3): qty 100

## 2022-01-03 MED ORDER — HYDROCODONE-ACETAMINOPHEN 5-325 MG PO TABS: 1.0000 | ORAL_TABLET | ORAL | Status: DC | PRN

## 2022-01-03 MED ORDER — DEXAMETHASONE SODIUM PHOSPHATE 10 MG/ML IJ SOLN
INTRAMUSCULAR | Status: DC | PRN
Start: 2022-01-03 — End: 2022-01-03
  Administered 2022-01-03: 5 mg via INTRAVENOUS

## 2022-01-03 MED ORDER — KETOROLAC TROMETHAMINE 15 MG/ML IJ SOLN
15.0000 mg | Freq: Once | INTRAMUSCULAR | Status: AC
  Administered 2022-01-03: 15 mg via INTRAVENOUS

## 2022-01-03 MED ORDER — ROCURONIUM BROMIDE 100 MG/10ML IV SOLN
INTRAVENOUS | Status: DC | PRN
  Administered 2022-01-03: 10 mg via INTRAVENOUS
  Administered 2022-01-03: 40 mg via INTRAVENOUS

## 2022-01-03 MED ORDER — MORPHINE SULFATE (PF) 2 MG/ML IV SOLN: 1.0000 mg | INTRAVENOUS | Status: DC | PRN

## 2022-01-03 MED ORDER — SAW PALMETTO (SERENOA REPENS) 160 MG PO CAPS: 160.0000 mg | ORAL_CAPSULE | Freq: Two times a day (BID) | ORAL | Status: DC

## 2022-01-03 MED ORDER — FENTANYL CITRATE (PF) 100 MCG/2ML IJ SOLN
INTRAMUSCULAR | Status: DC | PRN
  Administered 2022-01-03: 50 ug via INTRAVENOUS

## 2022-01-03 MED ORDER — PROPOFOL 10 MG/ML IV BOLUS
INTRAVENOUS | Status: DC | PRN
  Administered 2022-01-03: 60 mg via INTRAVENOUS

## 2022-01-03 MED ORDER — ACETAMINOPHEN 500 MG PO TABS
500.0000 mg | ORAL_TABLET | Freq: Four times a day (QID) | ORAL | Status: AC
  Administered 2022-01-04 (×3): 500 mg via ORAL
  Filled 2022-01-03 (×4): qty 1

## 2022-01-03 MED ORDER — HYDROMORPHONE HCL 1 MG/ML IJ SOLN
0.2500 mg | INTRAMUSCULAR | Status: DC | PRN
  Administered 2022-01-03: 0.25 mg via INTRAVENOUS

## 2022-01-03 MED ORDER — BACITRACIN-NEOMYCIN-POLYMYXIN 400-5-5000 EX OINT
TOPICAL_OINTMENT | Freq: Every day | CUTANEOUS | Status: DC
  Administered 2022-01-03: 1 via TOPICAL
  Filled 2022-01-03 (×4): qty 1

## 2022-01-03 MED ORDER — 0.9 % SODIUM CHLORIDE (POUR BTL) OPTIME
TOPICAL | Status: DC | PRN
  Administered 2022-01-03: 1000 mL

## 2022-01-03 MED ORDER — ONDANSETRON HCL 4 MG/2ML IJ SOLN: 4.0000 mg | Freq: Four times a day (QID) | INTRAMUSCULAR | Status: DC | PRN

## 2022-01-03 MED ORDER — ADULT MULTIVITAMIN W/MINERALS CH
1.0000 | ORAL_TABLET | Freq: Every day | ORAL | Status: DC
  Administered 2022-01-04 – 2022-01-05 (×2): 1 via ORAL
  Filled 2022-01-03 (×2): qty 1

## 2022-01-03 MED ORDER — BISACODYL 10 MG RE SUPP: 10.0000 mg | Freq: Every day | RECTAL | Status: DC | PRN

## 2022-01-03 MED ORDER — PHENYLEPHRINE HCL (PRESSORS) 10 MG/ML IV SOLN
INTRAVENOUS | Status: DC | PRN
  Administered 2022-01-03: 40 ug via INTRAVENOUS

## 2022-01-03 MED ORDER — LACTATED RINGERS IV SOLN
INTRAVENOUS | Status: DC | PRN
Start: 2022-01-03 — End: 2022-01-03

## 2022-01-03 MED ORDER — ACETAMINOPHEN 10 MG/ML IV SOLN
INTRAVENOUS | Status: AC
  Filled 2022-01-03: qty 100

## 2022-01-03 MED ORDER — MAGNESIUM HYDROXIDE 400 MG/5ML PO SUSP: 30.0000 mL | Freq: Every day | ORAL | Status: DC | PRN

## 2022-01-03 MED ORDER — ONDANSETRON HCL 4 MG PO TABS: 4.0000 mg | ORAL_TABLET | Freq: Four times a day (QID) | ORAL | Status: DC | PRN

## 2022-01-03 MED ORDER — SUGAMMADEX SODIUM 200 MG/2ML IV SOLN
INTRAVENOUS | Status: DC | PRN
  Administered 2022-01-03: 150 mg via INTRAVENOUS

## 2022-01-03 MED ORDER — METOCLOPRAMIDE HCL 5 MG/ML IJ SOLN: 5.0000 mg | Freq: Three times a day (TID) | INTRAMUSCULAR | Status: DC | PRN

## 2022-01-03 MED ORDER — ACETAMINOPHEN 10 MG/ML IV SOLN
INTRAVENOUS | Status: DC | PRN
  Administered 2022-01-03: 1000 mg via INTRAVENOUS

## 2022-01-03 MED ORDER — LIDOCAINE HCL (CARDIAC) PF 100 MG/5ML IV SOSY
PREFILLED_SYRINGE | INTRAVENOUS | Status: DC | PRN
  Administered 2022-01-03: 60 mg via INTRAVENOUS

## 2022-01-03 MED ORDER — METOCLOPRAMIDE HCL 10 MG PO TABS: 5.0000 mg | ORAL_TABLET | Freq: Three times a day (TID) | ORAL | Status: DC | PRN

## 2022-01-03 MED ORDER — APIXABAN 5 MG PO TABS
5.0000 mg | ORAL_TABLET | Freq: Two times a day (BID) | ORAL | Status: DC
  Administered 2022-01-04 – 2022-01-05 (×3): 5 mg via ORAL
  Filled 2022-01-03 (×3): qty 1

## 2022-01-03 MED ORDER — CEFAZOLIN SODIUM-DEXTROSE 2-3 GM-%(50ML) IV SOLR
INTRAVENOUS | Status: DC | PRN
  Administered 2022-01-03: 2 g via INTRAVENOUS

## 2022-01-03 MED ORDER — POLYVINYL ALCOHOL 1.4 % OP SOLN: 1.0000 [drp] | Freq: Every day | OPHTHALMIC | Status: DC | PRN

## 2022-01-03 MED ORDER — CEFAZOLIN SODIUM 1 G IJ SOLR
INTRAMUSCULAR | Status: AC
  Filled 2022-01-03: qty 20

## 2022-01-03 SURGICAL SUPPLY — 42 items
BNDG COHESIVE 4X5 TAN ST LF (GAUZE/BANDAGES/DRESSINGS) ×2 IMPLANT
BNDG COHESIVE 6X5 TAN ST LF (GAUZE/BANDAGES/DRESSINGS) ×2 IMPLANT
CHLORAPREP W/TINT 26 (MISCELLANEOUS) ×4 IMPLANT
DRAPE 3/4 80X56 (DRAPES) ×2 IMPLANT
DRAPE C-ARMOR (DRAPES) ×2 IMPLANT
DRSG MEPILEX SACRM 8.7X9.8 (GAUZE/BANDAGES/DRESSINGS) ×2 IMPLANT
DRSG OPSITE POSTOP 3X4 (GAUZE/BANDAGES/DRESSINGS) IMPLANT
DRSG OPSITE POSTOP 4X6 (GAUZE/BANDAGES/DRESSINGS) IMPLANT
ELECT CAUTERY BLADE 6.4 (BLADE) ×2 IMPLANT
ELECT REM PT RETURN 9FT ADLT (ELECTROSURGICAL) ×2
ELECTRODE REM PT RTRN 9FT ADLT (ELECTROSURGICAL) ×1 IMPLANT
GAUZE SPONGE 4X4 12PLY STRL (GAUZE/BANDAGES/DRESSINGS) ×2 IMPLANT
GLOVE SURG ENC MOIS LTX SZ8 (GLOVE) ×4 IMPLANT
GLOVE SURG UNDER LTX SZ8 (GLOVE) ×2 IMPLANT
GOWN STRL REUS W/ TWL LRG LVL3 (GOWN DISPOSABLE) ×1 IMPLANT
GOWN STRL REUS W/ TWL XL LVL3 (GOWN DISPOSABLE) ×1 IMPLANT
GOWN STRL REUS W/TWL LRG LVL3 (GOWN DISPOSABLE) ×2
GOWN STRL REUS W/TWL XL LVL3 (GOWN DISPOSABLE) ×2
GUIDEPIN VERSANAIL DSP 3.2X444 (ORTHOPEDIC DISPOSABLE SUPPLIES) ×1 IMPLANT
GUIDEWIRE 3.0X100MM BALL TIP (WIRE) ×1 IMPLANT
MANIFOLD NEPTUNE II (INSTRUMENTS) ×2 IMPLANT
MAT ABSORB  FLUID 56X50 GRAY (MISCELLANEOUS) ×2
MAT ABSORB FLUID 56X50 GRAY (MISCELLANEOUS) ×1 IMPLANT
NAIL HIP FRACTURE 11X380MM (Nail) ×1 IMPLANT
NDL FILTER BLUNT 18X1 1/2 (NEEDLE) ×1 IMPLANT
NEEDLE FILTER BLUNT 18X 1/2SAF (NEEDLE) ×1
NEEDLE FILTER BLUNT 18X1 1/2 (NEEDLE) ×1 IMPLANT
NEEDLE HYPO 22GX1.5 SAFETY (NEEDLE) ×2 IMPLANT
NS IRRIG 500ML POUR BTL (IV SOLUTION) ×2 IMPLANT
PACK HIP COMPR (MISCELLANEOUS) ×2 IMPLANT
SCREW BONE CORTICAL 5.0X42 (Screw) ×1 IMPLANT
SCREW LAG 10.5X120MM (Screw) ×1 IMPLANT
STAPLER SKIN PROX 35W (STAPLE) ×2 IMPLANT
STRAP SAFETY 5IN WIDE (MISCELLANEOUS) ×2 IMPLANT
SUT VIC AB 0 CT1 36 (SUTURE) ×2 IMPLANT
SUT VIC AB 1 CT1 36 (SUTURE) ×2 IMPLANT
SUT VIC AB 2-0 CT1 (SUTURE) ×4 IMPLANT
SYR 10ML LL (SYRINGE) ×2 IMPLANT
SYR 30ML LL (SYRINGE) ×2 IMPLANT
Smooth Guide Wire Ball Tip 3.0 x 100cm ×1 IMPLANT
TAPE MICROFOAM 4IN (TAPE) ×2 IMPLANT
WATER STERILE IRR 500ML POUR (IV SOLUTION) ×2 IMPLANT

## 2022-01-03 NOTE — Transfer of Care (Signed)
Immediate Anesthesia Transfer of Care Note ? ?Patient: Glendell Fouse ? ?Procedure(s) Performed: INTRAMEDULLARY (IM) NAIL INTERTROCHANTRIC (Right) ? ?Patient Location: PACU ? ?Anesthesia Type:General ? ?Level of Consciousness: drowsy and patient cooperative ? ?Airway & Oxygen Therapy: Patient Spontanous Breathing and Patient connected to face mask oxygen ? ?Post-op Assessment: Report given to RN and Post -op Vital signs reviewed and stable ? ?Post vital signs: Reviewed and stable ? ?Last Vitals:  ?Vitals Value Taken Time  ?BP 109/57 01/03/22 1327  ?Temp    ?Pulse 74 01/03/22 1330  ?Resp 17 01/03/22 1330  ?SpO2 100 % 01/03/22 1330  ?Vitals shown include unvalidated device data. ? ?Last Pain:  ?Vitals:  ? 01/03/22 0942  ?TempSrc:   ?PainSc: Asleep  ?   ? ?Patients Stated Pain Goal: 2 (01/02/22 2310) ? ?Complications: No notable events documented. ?

## 2022-01-03 NOTE — Progress Notes (Signed)
?Progress Note ? ? ? ?Jason Warren   ?RSW:[id number]  ?DOB: [birth year]  ?DOA: 01/02/2022     0 ?PCP: Baxter Hire, MD ? ?Initial CC: fall at home ? ?Hospital Course: ?Jason Warren is a 88 year old male with PMH lung cancer with brain mets, CAD, chronic A-fib who presented to the hospital after a fall at home.  He was sent to the ER for further evaluation.  Imaging showed intertrochanteric right hip fracture. ?He also has been having some swelling in his right hand third digit and underwent CT of the hand which showed soft tissue swelling particularly around the right third PIP with no surrounding fracture or osseous erosion. ? 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 Patient has history of A-fib and is on Eliquis and sees Dr. Nehemiah Massed last echocardiogram in 2020 to February showed ejection fraction of 55 to 60%.  Patient has Mediport for access. ? ?He was evaluated by orthopedic surgery on admission and consented for undergoing surgical repair. ? ?Interval History:  ?Seen in his room after surgery with family bedside.  Overall feeling well still and not having a lot of surgical pain.  He is motivated for working with PT. ? ?Assessment and Plan: ?* Closed right hip fracture (HCC) ?- S/p mechanical fall at home ?- Imaging shows comminuted angulated intertrochanteric right hip fracture with avulsion of the lesser trochanter ?- Evaluated by orthopedic surgery and underwent ORIF on 01/03/2022 ? ?Dizziness ?- Continue fluids given concern for some possible dehydration on admission ?- Continue on telemetry ? ?Pain of right middle finger ?- Swelling noted  on CT of hand.  He had been on doxycycline outpatient and NSAIDs.  No fracture or infection appreciated on CT hand ?- Follow-up any further orthopedic recommendations; okay to keep fingers splinted for comfort ?- Discontinue antibiotics and monitor ? ?Scalp laceration ?- Due to fall.  Wound inspected and currently covered with Tegaderm.  Not large or deep enough to require any staples or surgical glue at this time ?- Continue dressing, can apply smaller bandage if needed for comfort ?-Neosporin ordered ? ?Fall at home, initial encounter ?- Possibly related to volume depletion however may also have some contribution from his right frontal brain metastasis with vasogenic edema.  He has been on Decadron taper for this ?- Will need PT after surgery ? ? ? ?Persistent atrial fibrillation ([place]) ?- Rate controlled, no medication noted on med rec ?- Eliquis on hold in setting of surgery. To be resumed as able ? ? 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 ? ?Old records reviewed in assessment of this patient ? ?Antimicrobials: ? ? ?DVT prophylaxis:  ?SCDs Start: 01/03/22 1500 ?apixaban (ELIQUIS) tablet 5 mg  ? ?Code Status:   Code Status: Full Code ? ?Disposition Plan:  Likely SNF ?Status is: Obs ? ?Objective: ?Blood pressure 111/61, pulse 75, temperature 97.6 ?F (36.4 ?C), resp. rate 18, height 6' (1.829 m), weight 72.6 kg, SpO2 90 %.  ?Examination:  ?Physical Exam ?Constitutional:   ?   General: He is not in acute distress. ?   Appearance: Normal appearance.  ?HENT:  ?   Head: Normocephalic and atraumatic.  ?   Comments: Small right posterior scalp lac noted  ?    Mouth/Throat:  ?   Mouth: Mucous membranes are moist.  ?Eyes:  ?   Extraocular Movements: Extraocular movements intact.  ?Cardiovascular:  ?   Rate and Rhythm: Normal rate and regular rhythm.  ?   Heart sounds: Normal heart sounds.  ?Pulmonary:  ?   Effort: Pulmonary effort is normal. No respiratory distress.  ?   Breath sounds: Normal breath sounds. No wheezing.  ?Abdominal:  ?   General: Bowel sounds are normal. There is no distension.  ?   Palpations: Abdomen is soft.  ?   Tenderness: There is no abdominal tenderness.  ?Musculoskeletal:     ?   General: Normal range of motion.  ?   Cervical back: Normal range of motion and neck supple.  ?   Comments: Right hand 3rd/4th digits splinted together; right hip surgical dressings noted and clean/dry. Compartment is soft but swollen. TTP as expected  ?Skin: ?   General: Skin is warm and dry.  ?Neurological:  ?   General: No focal deficit present.  ?   Mental Status: He is alert.  ?Psychiatric:     ?   Mood and Affect: Mood normal.     ?   Behavior: Behavior normal.  ?  ? ?Consultants:  ?Orthopedic surgery ? ?Procedures:  ?Reduction and internal fixation of displaced intertrochanteric right hip fracture with Biomet Affixis TFN nail., 01/03/22 ? ?Data Reviewed: ?Results for orders placed or performed during the hospital encounter of 01/02/22 (from the past 24 hour(s))  ?CBC with Differential     Status: Abnormal  ? Collection Time: 01/02/22  6:32 PM  ?Result Value Ref Range  ? WBC 8.6 4.0 - 10.5 K/uL  ? RBC 4.56 4.22 - 5.81 MIL/uL  ? Hemoglobin 14.8 13.0 - 17.0 g/dL  ? HCT 43.4 39.0 - 52.0 %  ? MCV 95.2 80.0 - 100.0 fL  ? MCH 32.5 26.0 - 34.0 pg  ? MCHC 34.1 30.0 - 36.0 g/dL  ? RDW 14.9 11.5 - 15.5 %  ? Platelets 150 150 - 400 K/uL  ? nRBC 0.0 0.0 - 0.2 %  ? Neutrophils Relative % 76 %  ? Neutro Abs 6.6 1.7 - 7.7 K/uL  ? Lymphocytes Relative 14 %  ? Lymphs Abs 1.2 0.7 - 4.0 K/uL  ? Monocytes Relative 7 %  ? Monocytes Absolute 0.6 0.1 - 1.0 K/uL  ? Eosinophils Relative 1 %  ?  Eosinophils Absolute 0.1 0.0 - 0.5 K/uL  ? Basophils Relative 0 %  ? Basophils Absolute 0.0 0.0 - 0.1 K/uL  ? Immature Granulocytes 2 %  ? Abs Immature Granulocytes 0.13 (H) 0.00 - 0.07 K/uL  ?Basic metabolic panel     Status: Abnormal  ? Collection Time: 01/02/22  6:32 PM  ?Result Value Ref Range  ? Sodium 133 (L) 135 - 145 mmol/L  ? Potassium 4.3 3.5 - 5.1 mmol/L  ? Chloride 101 98 - 111 mmol/L  ? CO2 25 22 - 32 mmol/L  ? Glucose, Bld 111 (H) 70 - 99 mg/dL  ?  BUN 33 (H) 8 - 23 mg/dL  ? Creatinine, Ser 1.24 0.61 - 1.24 mg/dL  ? Calcium 8.2 (L) 8.9 - 10.3 mg/dL  ? GFR, Estimated 55 (L) >60 mL/min  ? Anion gap 7 5 - 15  ?Type and screen [place]     Status: None  ? Collection Time: 01/02/22 11:04 PM  ?Result Value Ref Range  ? ABO/RH(D) O POS   ? Antibody Screen NEG   ? Sample Expiration    ?  01/05/2022,2359 ?Performed at [facility], [address], [place], [place] [address] ?  ?CBC     Status: Abnormal  ? Collection Time: 01/03/22  5:16 AM  ?Result Value Ref Range  ? WBC 8.2 4.0 - 10.5 K/uL  ? RBC 3.94 (L) 4.22 - 5.81 MIL/uL  ? Hemoglobin 12.8 (L) 13.0 - 17.0 g/dL  ? HCT 37.4 (L) 39.0 - 52.0 %  ? MCV 94.9 80.0 - 100.0 fL  ? MCH 32.5 26.0 - 34.0 pg  ? MCHC 34.2 30.0 - 36.0 g/dL  ? RDW 14.8 11.5 - 15.5 %  ? Platelets 142 (L) 150 - 400 K/uL  ? nRBC 0.0 0.0 - 0.2 %  ?Basic metabolic panel     Status: Abnormal  ? Collection Time: 01/03/22  5:16 AM  ?Result Value Ref Range  ? Sodium 133 (L) 135 - 145 mmol/L  ? Potassium 4.9 3.5 - 5.1 mmol/L  ? Chloride 101 98 - 111 mmol/L  ? CO2 27 22 - 32 mmol/L  ? Glucose, Bld 120 (H) 70 - 99 mg/dL  ? BUN 26 (H) 8 - 23 mg/dL  ? Creatinine, Ser 0.94 0.61 - 1.24 mg/dL  ? Calcium 7.9 (L) 8.9 - 10.3 mg/dL  ? GFR, Estimated >60 >60 mL/min  ? Anion gap 5 5 - 15  ?  ?I have Reviewed nursing notes, Vitals, and Lab results since pt's last encounter. Pertinent lab results : see above ?I have ordered test including BMP, CBC, Mg ?I have reviewed the last note  from staff over past 24 hours ?I have discussed pt's care plan and test results with nursing staff, case manager ? ? LOS: 0 days  ? ?Dwyane Dee, MD ?[facility] ?01/03/2022, 3:57 PM ? ?

## 2022-01-03 NOTE — Anesthesia Procedure Notes (Signed)
Procedure Name: Intubation ?Date/Time: 01/03/2022 11:51 AM ?Performed by: Jerrye Noble, CRNA ?Pre-anesthesia Checklist: Patient identified, Emergency Drugs available, Suction available and Patient being monitored ?Patient Re-evaluated:Patient Re-evaluated prior to induction ?Oxygen Delivery Method: Circle system utilized ?Preoxygenation: Pre-oxygenation with 100% oxygen ?Induction Type: IV induction ?Ventilation: Mask ventilation without difficulty ?Laryngoscope Size: McGraph and 3 ?Grade View: Grade I ?Tube type: Oral ?Number of attempts: 1 ?Airway Equipment and Method: Stylet, Oral airway and Video-laryngoscopy ?Placement Confirmation: ETT inserted through vocal cords under direct vision, positive ETCO2 and breath sounds checked- equal and bilateral ?Secured at: 23 cm ?Tube secured with: Tape ?Dental Injury: Teeth and Oropharynx as per pre-operative assessment  ? ? ? ? ?

## 2022-01-03 NOTE — Progress Notes (Signed)
PHARMACIST - PHYSICIAN ORDER COMMUNICATION ? ?CONCERNING: P&T Medication Policy on Herbal Medications ? ?DESCRIPTION:  This patient?s order for:  Saw [place]met[place]  has been noted. ? ?This product(s) is classified as an ?herbal? or natural product. ?Due to a lack of definitive safety studies or FDA approval, nonstandard manufacturing practices, plus the potential risk of unknown drug-drug interactions while on inpatient medications, the Pharmacy and Therapeutics Committee does not permit the use of ?herbal? or natural products of this type within [facility]. ?  ?ACTION TAKEN: ?The pharmacy department is unable to verify this order at this time. ?Please reevaluate patient?s clinical condition at discharge and address if the herbal or natural product(s) should be resumed at that time. ? ?Paulina Fusi, PharmD, BCPS ?01/03/2022 ?3:07 PM ? ?

## 2022-01-03 NOTE — Anesthesia Preprocedure Evaluation (Addendum)
Anesthesia Evaluation  ?Patient identified by MRN, date of birth, ID band ?Patient awake ? ? ? ?Reviewed: ?Allergy & Precautions, H&P , NPO status , Patient's Chart, lab work & pertinent test results ? ?History of Anesthesia Complications ?Negative for: history of anesthetic complications ? ?Airway ?Mallampati: III ? ?TM Distance: >3 FB ?Neck ROM: limited ? ? ? Dental ?no notable dental hx. ? ?  ?Pulmonary ?former smoker,  ?Lung cancer  ? ?# Right lower lobe - ~4.6 x 3.1 cm  (PET 07-2020)- demonstrates no significant hypermetabolic activity (SUV max 1.8)-benign. STABLE. ?  ?Pulmonary exam normal ? ? ? ? ? ? ? Cardiovascular ?Exercise Tolerance: Good ?METS: 3 - Mets (-) angina+CHF  ?(-) Past MI and (-) DOE Normal cardiovascular exam+ dysrhythmias (LBBB ) Atrial Fibrillation + Valvular Problems/Murmurs (Mild) MR  ? ? ?  ?Neuro/Psych ?Met to brain with LLE weakness ?negative psych ROS  ? GI/Hepatic ?negative GI ROS, Neg liver ROS, neg GERD  ,  ?Endo/Other  ?negative endocrine ROS ? Renal/GU ?  ? ?  ?Musculoskeletal ? ?(+) Arthritis , swelling of his right 3rd MCP ? ?Hip fx  ? Abdominal ?Normal abdominal exam  (+)   ?Peds ? Hematology ?negative hematology ROS ?(+)   ?Anesthesia Other Findings ?Pt with history of non-small cell left lower lobe lung cancer status post chemoradiation with mets to the brain currently being followed by oncology, when patient had lower left extremity weakness he had an MRI that showed MRI done in March shows a 2.1 cm enhancing metastatic lesion with mild edema and mass effect but no midline shift patient received SRS and was started on dexamethasone taper ? ?Past Medical History: ?No date: Arthritis ?No date: Dysrhythmia ?    Comment:  A-fib ?No date: Hip pain ?    Comment:  left ?No date: HOH (hard of hearing) ?No date: Skin cancer, basal cell ?    Comment:   face top of head ? ?Past Surgical History: ?07/13/2017: CATARACT EXTRACTION W/PHACO; Right ?     Comment:  Procedure: CATARACT EXTRACTION PHACO AND INTRAOCULAR  ?             LENS PLACEMENT ([place]);  Surgeon: Leandrew Koyanagi, MD; ?             Location: [facility];  Service: Ophthalmology;   ?             Laterality: Right;  IVA TOPICAL ?RIGHT ?01/04/2018: CATARACT EXTRACTION W/PHACO; Left ?    Comment:  Procedure: CATARACT EXTRACTION PHACO AND INTRAOCULAR  ?             LENS PLACEMENT ([facility]) LEFT;  Surgeon: Wallace Going,  ?             Nila Nephew, MD;  Location: [facility];  Service:  ?             Ophthalmology;  Laterality: Left; ?No date: COLONOSCOPY ?No date: JOINT REPLACEMENT ?    Comment:  Left total hip Dr. Su Hoff 08-04-18 ?No date: ROTATOR CUFF REPAIR; Right ?No date: SKIN CANCER EXCISION ?    Comment:  face ?08/04/2018: TOTAL HIP ARTHROPLASTY; Left ?    Comment:  Procedure: TOTAL HIP ARTHROPLASTY ANTERIOR APPROACH;   ?             Surgeon: Frederik Pear, MD;  Location: [facility];  Service:  ?             Orthopedics;  Laterality: Left; ? ?BMI   ?  Body Mass Index: 21.71 kg/m?  ?  ? ? Reproductive/Obstetrics ?negative OB ROS ? ?  ? ? ? ? ? ? ? ? ? ? ? ? ? ?  ?  ? ? ? ? ? ? ? ?Anesthesia Physical ? ?Anesthesia Plan ? ?ASA: III ? ?Anesthesia Plan: General ETT  ? ?Post-op Pain Management:   ? ?Induction: Intravenous ? ?PONV Risk Score and Plan: Ondansetron, Dexamethasone and Treatment may vary due to age or medical condition ? ?Airway Management Planned: Oral ETT ? ?Additional Equipment:  ? ?Intra-op Plan:  ? ?Post-operative Plan: Extubation in OR ? ?Informed Consent: I have reviewed the patients History and Physical, chart, labs and discussed the procedure including the risks, benefits and alternatives for the proposed anesthesia with the patient or authorized representative who has indicated his/her understanding and acceptance.  ? ? ? ?Dental Advisory Given ? ?Plan Discussed with: Anesthesiologist, CRNA and Surgeon ? ?Anesthesia Plan Comments: (Patient consented for risks of anesthesia  including but not limited to:  ?- adverse reactions to medications ?- damage to eyes, teeth, lips or other oral mucosa ?- nerve damage due to positioning  ?- sore throat or hoarseness ?- Damage to heart, brain, nerves, lungs, other parts of body or loss of life ? ?Patient voiced understanding.)  ? ? ? ? ? ?Anesthesia Quick Evaluation ? ?

## 2022-01-03 NOTE — Assessment & Plan Note (Addendum)
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  Confirmed with pharmacy ?

## 2022-01-03 NOTE — Op Note (Signed)
01/03/2022 ? ?1:26 PM ? ?Patient:   Jason Warren ? ?Pre-Op Diagnosis:   Closed displaced three-part intertrochanteric fracture, right hip. ? ?Post-Op Diagnosis:   Same ? ?Procedure:   Reduction and internal fixation of displaced intertrochanteric right hip fracture with Biomet Affixis TFN nail. ? ?Surgeon:   Pascal Lux, MD ? ?Assistant:   None ? ?Anesthesia:   GET ? ?Findings:   As above ? ?Complications:   None ? ?EBL:   50 cc ? ?Fluids:   1100 cc crystalloid ? ?UOP:   None cc ? ?TT:   None ? ?Drains:   None ? ?Closure:   Staples ? ?Implants:   Biomet Affixis 11 x 380 mm TFN with a 120 mm lag screw and a 42 mm distal interlocking screw ? ?Brief Clinical Note:   The patient is a 88 year old male who injured his right hip yesterday afternoon when he lost his balance and fell in his home. He was brought to the emergency room where x-rays demonstrated the above-noted injury. The patient has been cleared medically and presents at this time for reduction and internal fixation of the displaced intertrochanteric right hip fracture. ? ?Procedure:   The patient was brought into the operating room and lain in the supine position.  After adequate general endotracheal intubation and anesthesia were obtained, the patient was repositioned on the fracture table. The uninjured leg was placed in a flexed and abducted position while the injured lower extremity was placed in longitudinal traction. The fracture was reduced using longitudinal traction and internal rotation. The adequacy of reduction was verified fluoroscopically in AP and lateral projections and found to be near anatomic. The lateral aspects of the right hip and thigh were prepped with ChloraPrep solution before being draped sterilely. Preoperative antibiotics were administered. A timeout was performed to verify the appropriate surgical site.  ? ?The greater trochanter was identified fluoroscopically and an approximately 3 cm incision made about 2-3 fingerbreadths  above the tip of the greater trochanter. The incision was carried down through the subcutaneous tissues to expose the gluteal fascia. This was split the length of the incision, providing access to the tip of the trochanter. Under fluoroscopic guidance, a guidewire was drilled through the tip of the trochanter into the proximal metaphysis to the level of the lesser trochanter. After verifying its position fluoroscopically in AP and lateral projections, it was overreamed with the initial reamer to the depth of the lesser trochanter. A guidewire was passed down through the femoral canal to the supracondylar region. The adequacy of guidewire position was verified fluoroscopically in AP and lateral projections before the length of the guidewire within the canal was measured and found to be 405 mm. Therefore, a 380 mm length nail was selected. The guidewire was overreamed sequentially using the flexible reamers, beginning with a 10.5 mm reamer and progressing to a 12.5 mm reamer. This provided good cortical chatter. The 11 x 380 mm Biomet Affixis TFN rod was selected and advanced to the appropriate depth, as verified fluoroscopically.  ? ?The guide system for the lag screw was positioned and advanced through an approximately 2 cm stab incision over the lateral aspect of the proximal femur. The guidewire was drilled up through the trochanteric femoral nail and into the femoral neck to rest within 5 mm of subchondral bone. After verifying its position in the femoral neck and head in both AP and lateral projections, the guidewire was measured and found to be optimally replicated by a 825 mm lag  screw. The guidewire was overreamed to the appropriate depth before the lag screw was inserted and advanced to the appropriate depth as verified fluoroscopically in AP and lateral projections. The locking screw was advanced, then backed off a quarter turn to set the lag screw. Again the adequacy of hardware position and fracture  reduction was verified fluoroscopically in AP and lateral projections and found to be excellent. ? ?Attention was directed distally. Using the "perfect circle" technique, the leg and fluoroscopy machine were positioned appropriately. An approximately 1.5 cm stab incision was made over the skin at the appropriate point before the drill bit was advanced through the cortex and across the static hole of the nail. The appropriate length of the screw was determined before the 42 mm distal interlocking screw was positioned, then advanced and tightened securely. Again the adequacy of screw position was verified fluoroscopically in AP and lateral projections and found to be excellent. ? ?The wounds were irrigated thoroughly with sterile saline solution before the abductor fascia was reapproximated using #1 Vicryl interrupted sutures. The subcutaneous tissues were closed using 2-0 Vicryl interrupted sutures. The skin was closed using staples. A total of 30 cc of 0.5% Sensorcaine with epinephrine was injected in and around all incisions. Sterile occlusive dressings were applied to all wounds before the patient was transferred back to his/her hospital bed. The patient was then returned to the recovery room in satisfactory condition after tolerating the procedure well. ?

## 2022-01-03 NOTE — Assessment & Plan Note (Signed)
-   Due to fall.  Wound inspected and currently covered with Tegaderm.  Not large or deep enough to require any staples or surgical glue at this time ?- Continue dressing, can apply smaller bandage if needed for comfort ?-Neosporin ordered ?

## 2022-01-03 NOTE — Hospital Course (Signed)
Jason Warren is a 88 year old male with PMH lung cancer with brain mets, CAD, chronic A-fib who presented to the hospital after a fall at home.  He was sent to the ER for further evaluation.  Imaging showed intertrochanteric right hip fracture. ?He also has been having some swelling in his right hand third digit and underwent CT of the hand which showed soft tissue swelling particularly around the right third PIP with no surrounding fracture or osseous erosion. ? 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 Patient has history of A-fib and is on Eliquis and sees Dr. Nehemiah Massed last echocardiogram in 2020 to February showed ejection fraction of 55 to 60%.  Patient has Mediport for access. ? ?He was evaluated by orthopedic surgery on admission and consented for undergoing surgical repair. ?

## 2022-01-03 NOTE — Consult Note (Signed)
ORTHOPAEDIC CONSULTATION ? ?REQUESTING PHYSICIAN: Dwyane Dee, MD ? ?Chief Complaint:   ?Right hip pain. ? ?History of Present Illness: ?Jason Warren is a 88 y.o. male with chronic atrial fibrillation, coronary artery disease, and history of lung cancer who lives independently with his wife.  Apparently, the patient was in his usual state of health yesterday afternoon when he apparently leaned forward then could not stop himself and fell onto his right side, injuring his right hip.  He was brought to the emergency room where x-rays demonstrated a displaced intertrochanteric fracture of the right hip.  The patient has been admitted in preparation for definitive management of this injury.  The patient did strike the right side of his head, but a CT in the emergency room showed there were no acute findings.  He denies any other injuries.  He did not lose consciousness.  He also denies any lightheadedness, dizziness, chest pain, shortness of breath, or other symptoms which may have precipitated his fall. ? ?Past Medical History:  ?Diagnosis Date  ? Arthritis   ? Dysrhythmia   ? A-fib  ? Hip pain   ? left  ? HOH (hard of hearing)   ? LBBB (left bundle branch block) 08/05/2020  ? Lung cancer ([place])   ? Skin cancer, basal cell   ?  face top of head  ? ?Past Surgical History:  ?Procedure Laterality Date  ? CATARACT EXTRACTION W/PHACO Right 07/13/2017  ? Procedure: CATARACT EXTRACTION PHACO AND INTRAOCULAR LENS PLACEMENT ([facility]);  Surgeon: Leandrew Koyanagi, MD;  Location: [facility];  Service: Ophthalmology;  Laterality: Right;  IVA TOPICAL ?RIGHT  ? CATARACT EXTRACTION W/PHACO Left 01/04/2018  ? Procedure: CATARACT EXTRACTION PHACO AND INTRAOCULAR LENS PLACEMENT ([place]) LEFT;  Surgeon: Leandrew Koyanagi, MD;  Location: [facility];  Service: Ophthalmology;  Laterality: Left;  ? COLONOSCOPY    ? IR IMAGING GUIDED PORT INSERTION  08/27/2020   ? JOINT REPLACEMENT    ? Left total hip Dr. Su Hoff 08-04-18  ? ROTATOR CUFF REPAIR Right   ? SKIN CANCER EXCISION    ? face  ? TOTAL HIP ARTHROPLASTY Left 08/04/2018  ? Procedure: TOTAL HIP ARTHROPLASTY ANTERIOR APPROACH;  Surgeon: Frederik Pear, MD;  Location: [facility];  Service: Orthopedics;  Laterality: Left;  ? VIDEO BRONCHOSCOPY WITH ENDOBRONCHIAL NAVIGATION N/A 08/07/2020  ? Procedure: VIDEO BRONCHOSCOPY WITH ENDOBRONCHIAL NAVIGATION;  Surgeon: Ottie Glazier, MD;  Location: [facility];  Service: Thoracic;  Laterality: N/A;  ? VIDEO BRONCHOSCOPY WITH ENDOBRONCHIAL ULTRASOUND N/A 08/07/2020  ? Procedure: VIDEO BRONCHOSCOPY WITH ENDOBRONCHIAL ULTRASOUND;  Surgeon: Ottie Glazier, MD;  Location: [facility];  Service: Thoracic;  Laterality: N/A;  ? ?Social History  ? ?Socioeconomic History  ? Marital status: Married  ?  Spouse name: Not on file  ? Number of children: Not on file  ? Years of education: Not on file  ? Highest education level: Not on file  ?Occupational History  ? Not on file  ?Tobacco Use  ? Smoking status: Former  ?  Packs/day: 1.00  ?  Years: 4.00  ?  Pack years: 4.00  ?  Types: Cigarettes  ? Smokeless tobacco: Never  ? Tobacco comments:  ?  quit early 70's  ?Vaping Use  ? Vaping Use: Never used  ?Substance and Sexual Activity  ? Alcohol use: No  ? Drug use: No  ? Sexual activity: Not Currently  ?Other Topics Concern  ? Not on file  ?Social History Narrative  ? > quit 35 years; smoked  for 15 years. Rare alcohol. 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   ? ?Social Determinants of Health  ? ?Financial Resource Strain: Not on file  ?Food Insecurity: Not on file  ?Transportation Needs: Not on file  ?Physical Activity: Not on file  ?Stress: Not on file  ?Social Connections: Not on file  ? ?Family History  ?Problem Relation Age of Onset  ? Throat cancer Brother   ?      & lung cancer  ? ?No Known Allergies ?Prior to Admission medications    ?Medication Sig Start Date End Date Taking? Authorizing Provider  ?acetaminophen (TYLENOL) 500 MG tablet Take 500 mg by mouth every 8 (eight) hours as needed for moderate pain.   Yes [provider]  ?apixaban (ELIQUIS) 5 MG TABS tablet Take 5 mg by mouth 2 (two) times daily.   Yes [provider]  ?doxycycline (VIBRAMYCIN) 100 MG capsule Take 100 mg by mouth 2 (two) times daily. 01/02/22  Yes [provider]  ?meloxicam (MOBIC) 15 MG tablet Take 15 mg by mouth daily. 01/02/22  Yes [provider]  ?Multiple Vitamin (MULTIVITAMIN) tablet Take 1 tablet by mouth daily.   Yes [provider]  ?pravastatin (PRAVACHOL) 80 MG tablet Take 80 mg by mouth at bedtime.    Yes [provider]  ?saw palmetto 160 MG capsule Take 160 mg by mouth 2 (two) times daily.   Yes [provider]  ?dexamethasone (DECADRON) 2 MG tablet Take 1 tablet (2 mg total) by mouth daily. ?Patient not taking: Reported on 01/02/2022 12/03/21   Borders, Kirt Boys, NP  ?dexamethasone (DECADRON) 4 MG tablet Take 1 tablet (4 mg total) by mouth 2 (two) times daily. 4 mg BID x3 days, 4 mg daily x 3 days, 2 mg daily x 3 days ?Patient not taking: Reported on 01/02/2022 12/23/21   Noreene Filbert, MD  ?hydroxypropyl methylcellulose / hypromellose (ISOPTO TEARS / GONIOVISC) 2.5 % ophthalmic solution Place 1 drop into the left eye daily as needed for dry eyes.    [provider]  ?lidocaine-prilocaine (EMLA) cream Apply 1 application topically as needed. Apply small amount to port site at least 1 hour prior to it being accessed, cover with plastic wrap 08/22/20   [name], MD  ? ?DG Chest 1 View ? ?Result Date: 01/02/2022 ?CLINICAL DATA:  History of lung cancer. Right hip fracture. No chest complaints. EXAM: CHEST  1 VIEW COMPARISON:  June 18, 2021 FINDINGS: Post radiation/therapeutic changes identified in the left perihilar region. Right Port-A-Cath is stable. Mild scarring in the right  mid to lower lung. The cardiomediastinal silhouette is unchanged. No pneumothorax. No other acute abnormalities. IMPRESSION: Chronic scarring in the left perihilar region consistent with post therapeutic change. Mild scar carotic atelectasis in the right mid to lower lung. No acute abnormalities. Electronically Signed   By: Dorise Bullion III M.D.   On: 01/02/2022 19:41  ? ?CT Head Wo Contrast ? ?Result Date: 01/02/2022 ?CLINICAL DATA:  Fall. On Eliquis. Metastatic lung cancer with known brain metastasis. EXAM: CT HEAD WITHOUT CONTRAST CT CERVICAL SPINE WITHOUT CONTRAST TECHNIQUE: Multidetector CT imaging of the head and cervical spine was performed following the standard protocol without intravenous contrast. Multiplanar CT image reconstructions of the cervical spine were also generated. RADIATION DOSE REDUCTION: This exam was performed according to the departmental dose-optimization program which includes automated exposure control, adjustment of the mA and/or kV  according to patient size and/or use of iterative reconstruction technique. COMPARISON:  MRI 12/16/2021 FINDINGS: CT HEAD FINDINGS Brain: Low-density mass in the parafalcine aspect of the high right frontal lobe with surrounding vasogenic edema. Mass measures approximately 1.6 x 1.4 cm (series 2, image 24). Appearance is grossly stable from recent MRI. No additional masses are identified. No evidence of acute infarction, hemorrhage, hydrocephalus, or extra-axial collection. Scattered low-density changes within the periventricular and subcortical white matter compatible with chronic microvascular ischemic change. Mild diffuse cerebral volume loss. Vascular: Atherosclerotic calcifications involving the large vessels of the skull base. No unexpected hyperdense vessel. Skull: Normal. Negative for fracture or focal lesion. Sinuses/Orbits: No acute finding. Other: Negative for scalp hematoma. CT CERVICAL SPINE FINDINGS Alignment: Facet joints are aligned without  dislocation or traumatic listhesis. Dens and lateral masses are aligned. Degenerative grade 1 anterolisthesis of C7 on T1. Skull base and vertebrae: No acute fracture. No primary bone lesion or focal pa

## 2022-01-04 ENCOUNTER — Telehealth: Payer: Self-pay | Admitting: *Deleted

## 2022-01-04 DIAGNOSIS — C3492 Malignant neoplasm of unspecified part of left bronchus or lung: Secondary | ICD-10-CM | POA: Diagnosis not present

## 2022-01-04 DIAGNOSIS — I4819 Other persistent atrial fibrillation: Secondary | ICD-10-CM | POA: Diagnosis not present

## 2022-01-04 DIAGNOSIS — S72001A Fracture of unspecified part of neck of right femur, initial encounter for closed fracture: Secondary | ICD-10-CM | POA: Diagnosis not present

## 2022-01-04 LAB — BASIC METABOLIC PANEL
Anion gap: 6 (ref 5–15)
BUN: 28 mg/dL — ABNORMAL HIGH (ref 8–23)
CO2: 23 mmol/L (ref 22–32)
Calcium: 7.6 mg/dL — ABNORMAL LOW (ref 8.9–10.3)
Chloride: 103 mmol/L (ref 98–111)
Creatinine, Ser: 1.2 mg/dL (ref 0.61–1.24)
GFR, Estimated: 57 mL/min — ABNORMAL LOW (ref >60)
Glucose, Bld: 148 mg/dL — ABNORMAL HIGH (ref 70–99)
Potassium: 5 mmol/L (ref 3.5–5.1)
Sodium: 132 mmol/L — ABNORMAL LOW (ref 135–145)

## 2022-01-04 LAB — CBC
HCT: 31.2 % — ABNORMAL LOW (ref 39.0–52.0)
Hemoglobin: 10.6 g/dL — ABNORMAL LOW (ref 13.0–17.0)
MCH: 32.4 pg (ref 26.0–34.0)
MCHC: 34 g/dL (ref 30.0–36.0)
MCV: 95.4 fL (ref 80.0–100.0)
Platelets: 124 10*3/uL — ABNORMAL LOW (ref 150–400)
RBC: 3.27 MIL/uL — ABNORMAL LOW (ref 4.22–5.81)
RDW: 14.6 % (ref 11.5–15.5)
WBC: 10.6 10*3/uL — ABNORMAL HIGH (ref 4.0–10.5)
nRBC: 0 % (ref 0.0–0.2)

## 2022-01-04 NOTE — Plan of Care (Signed)

## 2022-01-04 NOTE — Anesthesia Postprocedure Evaluation (Signed)
Anesthesia Post Note ? ?Patient: Ethyn Schetter ? ?Procedure(s) Performed: INTRAMEDULLARY (IM) NAIL INTERTROCHANTRIC (Right) ? ?Patient location during evaluation: PACU ?Anesthesia Type: General ?Level of consciousness: awake and alert ?Pain management: pain level controlled ?Vital Signs Assessment: post-procedure vital signs reviewed and stable ?Respiratory status: spontaneous breathing, nonlabored ventilation and respiratory function stable ?Cardiovascular status: blood pressure returned to baseline and stable ?Postop Assessment: no apparent nausea or vomiting ?Anesthetic complications: no ? ? ?No notable events documented. ? ? ?Last Vitals:  ?Vitals:  ? 01/04/22 1119 01/04/22 1135  ?BP: (!) 152/106 121/62  ?Pulse: 82 73  ?Resp:    ?Temp:    ?SpO2: 94% 98%  ?  ?Last Pain:  ?Vitals:  ? 01/04/22 0900  ?TempSrc:   ?PainSc: 0-No pain  ? ? ?  ?  ?  ?  ?  ?  ? ?[place] Ouch ? ? ? ? ?

## 2022-01-04 NOTE — Progress Notes (Signed)
Physical Therapy Treatment ?Patient Details ?Name: Jason Warren ?MRN: [id number] ?DOB: May 30, 1929 ?Today's Date: 01/04/2022 ? ? ?History of Present Illness Pt is a 88 y.o. male who presents to the ED s/p fall on 4/1. Pt did report hitting his head against cabinets, but then landed on right side; primary c/o right hip pain. Admitted for Intramedullary nail of R. intertrochantric. PmHx: Arthritis, Dysrhythmia (A-Fib.), HOH, Lung Cancer. ? ?  ?PT Comments  ? ? Pt awake and alert resting in recliner w/ family at bedside upon PT entrance into room for today's session. Pt denies any c/o pain at rest and is willing to work w/ PT today. Pt is able to perform sit to stand w/ CGA using RW from recliner; BP obtained after 3 minutes due to suspected orthostatics in previous session (110/68). Pt able to progress to ambulating ~32ft w/ CGA using RW; pt request returning to room due to increase fatigue. Pt back in room w/ all needs w/in reach and family at bedside prior to PT exiting room. Pt will benefit from continued skilled PT in order to increase LE strength/endurance, improve mobility/balance, and restore PLOF. Current discharge recommendation to HHPT is appropriate due to the level of assistance required by the patient to ensure safety and improve overall function. ?   ?Recommendations for follow up therapy are one component of a multi-disciplinary discharge planning process, led by the attending physician.  Recommendations may be updated based on patient status, additional functional criteria and insurance authorization. ? ?Follow Up Recommendations ? Home health PT ?  ?  ?Assistance Recommended at Discharge Intermittent Supervision/Assistance  ?Patient can return home with the following A little help with walking and/or transfers;A little help with bathing/dressing/bathroom;Assist for transportation;Assistance with cooking/housework;Help with stairs or ramp for entrance ?  ?Equipment Recommendations ? Rolling walker (2  wheels)  ?  ?Recommendations for Other Services   ? ? ?  ?Precautions / Restrictions Precautions ?Precautions: Fall ?Precaution Comments: watch orthostatics ?Restrictions ?Weight Bearing Restrictions: Yes ?RLE Weight Bearing: Weight bearing as tolerated  ?  ? ?Mobility ? Bed Mobility ?Overal bed mobility: Needs Assistance ?Bed Mobility: Supine to Sit ?  ?  ?Supine to sit: Min assist ?  ?  ?General bed mobility comments: recieved in chair at start and back to chair ?  ? ?Transfers ?Overall transfer level: Needs assistance ?Equipment used: Rolling walker (2 wheels) ?Transfers: Sit to/from Stand ?Sit to Stand: Min guard ?  ?  ?  ?  ?  ?  ?  ? ?Ambulation/Gait ?Ambulation/Gait assistance: Min guard ?Gait Distance (Feet): 50 Feet ?Assistive device: Rolling walker (2 wheels) ?Gait Pattern/deviations: Step-through pattern, Decreased step length - right, Decreased step length - left, Decreased stride length ?Gait velocity: decreased ?  ?  ?  ? ? ?Stairs ?Stairs: Yes ?Stairs assistance: Min guard ?Stair Management: One rail Left ?Number of Stairs: 4 ?  ? ? ?Wheelchair Mobility ?  ? ?Modified Rankin (Stroke Patients Only) ?  ? ? ?  ?Balance Overall balance assessment: Needs assistance ?Sitting-balance support: Feet supported ?Sitting balance-Leahy Scale: Fair ?  ?  ?Standing balance support: Bilateral upper extremity supported, Reliant on assistive device for balance, During functional activity ?Standing balance-Leahy Scale: Fair ?  ?  ?  ?  ?  ?  ?  ?  ?  ?  ?  ?  ?  ? ?  ?Cognition Arousal/Alertness: Awake/alert ?Behavior During Therapy: [facility] for tasks assessed/performed ?Overall Cognitive Status: Within Functional Limits for tasks assessed ?  ?  ?  ?  ?  ?  ?  ?  ?  ?  ?  ?  ?  ?  ?  ?  ?  ?  ?  ? ?  ?  Exercises   ? ?  ?General Comments General comments (skin integrity, edema, etc.): BP 127/67 in sitting, 102/88 standing after 3 minutes, 120/53 back seated ?  ?  ? ?Pertinent Vitals/Pain Pain Assessment ?Pain Assessment:  No/denies pain  ? ? ?Home Living Family/patient expects to be discharged to:: Private residence ?Living Arrangements: Spouse/significant other ?Available Help at Discharge: Family;Available 24 hours/day ?Type of Home: House ?Home Access: Stairs to enter ?Entrance Stairs-Rails: Left ?Entrance Stairs-Number of Steps: 4 ?  ?Home Layout: One level ?Home Equipment: Cane - single Associate Professor (2 wheels) ?   ?  ?Prior Function    ?  ?  ?   ? ?PT Goals (current goals can now be found in the care plan section) Acute Rehab PT Goals ?Patient Stated Goal: to go home ?PT Goal Formulation: With patient ?Time For Goal Achievement: 01/18/22 ?Potential to Achieve Goals: Fair ?Progress towards PT goals: Progressing toward goals ? ?  ?Frequency ? ? ? BID ? ? ? ?  ?PT Plan Current plan remains appropriate  ? ? ?Co-evaluation   ?  ?  ?  ?  ? ?  ?AM-PAC PT "6 Clicks" Mobility   ?Outcome Measure ? Help needed turning from your back to your side while in a flat bed without using bedrails?: A Little ?Help needed moving from lying on your back to sitting on the side of a flat bed without using bedrails?: A Little ?Help needed moving to and from a bed to a chair (including a wheelchair)?: A Little ?Help needed standing up from a chair using your arms (e.g., wheelchair or bedside chair)?: A Little ?Help needed to walk in hospital room?: A Little ?Help needed climbing 3-5 steps with a railing? : A Lot ?6 Click Score: 17 ? ?  ?End of Session Equipment Utilized During Treatment: Gait belt ?Activity Tolerance: Patient tolerated treatment well;Patient limited by fatigue ?Patient left: with chair alarm set;in chair;with family/visitor present;with call bell/phone within reach ?Nurse Communication: Mobility status ?PT Visit Diagnosis: Unsteadiness on feet (R26.81);History of falling (Z91.81);Pain;Muscle weakness (generalized) (M62.81) ?Pain - Right/Left: Right ?Pain - part of body: Hip ?  ? ? ?Time: 1245-8099 ?PT Time Calculation  (min) (ACUTE ONLY): 20 min ? ?Charges:  $Gait Training: 8-22 mins ?$Therapeutic Activity: 8-22 mins          ?          ? ?Jonnie Kind, SPT ?01/04/2022, 3:05 PM ? ?

## 2022-01-04 NOTE — Telephone Encounter (Signed)
Dr. Baruch Gouty made aware of the patients fall.  He will go see him inpatient.   ?

## 2022-01-04 NOTE — Progress Notes (Signed)
?  Subjective: ?1 Day Post-Op Procedure(s) (LRB): ?INTRAMEDULLARY (IM) NAIL INTERTROCHANTRIC (Right) ?Patient reports pain as mild.   ?Patient is well, and has had no acute complaints or problems ?PT and care management to assist with discharge planning. ?Patient lives at home independently. ?Negative for chest pain and shortness of breath ?Fever: no ?Gastrointestinal:Negative for nausea and vomiting ?He reports that he is passing gas this morning ? ?Objective: ?Vital signs in last 24 hours: ?Temp:  [97.5 ?F (36.4 ?C)-99.2 ?F (37.3 ?C)] 97.5 ?F (36.4 ?C) (04/03 5749) ?Pulse Rate:  [55-85] 70 (04/03 0332) ?Resp:  [9-20] 17 (04/03 0332) ?BP: (97-121)/(54-71) 115/69 (04/03 0332) ?SpO2:  [90 %-100 %] 100 % (04/03 0332) ? ?Intake/Output from previous day: ? ?Intake/Output Summary (Last 24 hours) at 01/04/2022 0731 ?Last data filed at 01/04/2022 0405 ?Gross per 24 hour  ?Intake 1966.59 ml  ?Output 1050 ml  ?Net 916.59 ml  ?  ?Intake/Output this shift: ?No intake/output data recorded. ? ?Labs: ?Recent Labs  ?  01/02/22 ?1832 01/03/22 ?3552  ?HGB 14.8 12.8*  ? ?Recent Labs  ?  01/02/22 ?1832 01/03/22 ?1747  ?WBC 8.6 8.2  ?RBC 4.56 3.94*  ?HCT 43.4 37.4*  ?PLT 150 142*  ? ?Recent Labs  ?  01/02/22 ?1832 01/03/22 ?1595  ?NA 133* 133*  ?K 4.3 4.9  ?CL 101 101  ?CO2 25 27  ?BUN 33* 26*  ?CREATININE 1.24 0.94  ?GLUCOSE 111* 120*  ?CALCIUM 8.2* 7.9*  ? ?No results for input(s): LABPT, INR in the last 72 hours. ? ? ?EXAM ?General - Patient is Alert, Appropriate, and Oriented ?Extremity - ABD soft ?Neurovascular intact ?Intact pulses distally ?Dorsiflexion/Plantar flexion intact ?Incision: dressing C/D/I ?No cellulitis present ?Compartment soft ?Dressing/Incision - clean, dry, no drainage noted to right leg honeycomb dressings ?Motor Function - intact, moving foot and toes well on exam.  ?Intact bowel sounds. ? ?Past Medical History:  ?Diagnosis Date  ? Arthritis   ? Dysrhythmia   ? A-fib  ? Hip pain   ? left  ? HOH (hard of hearing)    ? LBBB (left bundle branch block) 08/05/2020  ? Lung cancer ([place])   ? Skin cancer, basal cell   ?  face top of head  ? ? ?Assessment/Plan: ?1 Day Post-Op Procedure(s) (LRB): ?INTRAMEDULLARY (IM) NAIL INTERTROCHANTRIC (Right) ?Principal Problem: ?  Closed right hip fracture ([place]) ?Active Problems: ?  Cancer of lower lobe of left lung ([place]) ?  Pain of right middle finger ?  Dizziness ?  Persistent atrial fibrillation ([place]) ?  Fall at home, initial encounter ?  Scalp laceration ? ?Estimated body mass index is 21.7 kg/m? as calculated from the following: ?  Height as of this encounter: 6' (1.829 m). ?  Weight as of this encounter: 72.6 kg. ?Advance diet ?Up with therapy ?D/C IV fluids when tolerating po intake. ? ?Labs reviewed this AM, Hg 12.8.  WBC 8.2. ?Na 133, increase oral intake. ?Up with therapy today.  Possible d/c home with HHPT vs. SNF pending progress. ?Patient would like to return home. ?Begin working on BM. ?Resume Eliquis ? ?DVT Prophylaxis -  SCDs and Eliquis ?Weight-Bearing as tolerated to right leg ? ?Raquel Kadejah Sandiford, PA-C ?[facility] ?01/04/2022, 7:31 AM  ?

## 2022-01-04 NOTE — Progress Notes (Signed)
Initial Nutrition Assessment ? ?DOCUMENTATION CODES:  ? ?Not applicable ? ?INTERVENTION:  ? ?-Liberalize diet to regular for widest variety of meal selections ?-Continue MVI with minerals daily ? ?NUTRITION DIAGNOSIS:  ? ?Increased nutrient needs related to post-op healing as evidenced by estimated needs. ? ?GOAL:  ? ?Patient will meet greater than or equal to 90% of their needs ? ?MONITOR:  ? ?PO intake, Labs, Weight trends, Skin, I & O's ? ?REASON FOR ASSESSMENT:  ? ?Consult ?Assessment of nutrition requirement/status, Hip fracture protocol ? ?ASSESSMENT:  ? ?Jason Warren is a 88 y.o. male with medical history significant of lung cancer with mets presenting with fall today. He is hard of hearing and family at bedside is giving history.Although it is unclear if he has syncopal episode. ? ?Pt admitted with rt hip fx s/p fall.  ? ?4/2- s/p Reduction and internal fixation of displaced intertrochanteric right hip fracture with [employer] TFN nail ? ?Reviewed I/O's: +917 ml x 24 hours and +1.1 L since admission ? ?UOP: 1 L x 24 hours ?  ?Pt unavailable at time of visit. Attempted to speak with pt via call to hospital room phone, however, unable to reach. RD unable to obtain further nutrition-related history or complete nutrition-focused physical exam at this time.   ? ?Pt currently on a heart healthy diet. Pt with good oral intake. Noted meal completions 100%.  ? ?Reviewed wt hx; pt has experienced a 1.4% wt loss over the past 3 months, which is not significant for time frame.  ? ?Per MD notes, plan to discharge home with home health tomorrow.  ? ?Medications reviewed and include colace.  ? ?Labs reviewed: Na: 132, CBGS: 80 (inpatient orders for glycemic control are none).   ? ?Diet Order:   ?Diet Order   ? ?       ?  Diet Heart Room service appropriate? Yes; Fluid consistency: Thin  Diet effective now       ?  ? ?  ?  ? ?  ? ? ?EDUCATION NEEDS:  ? ?No education needs have been identified at this time ? ?Skin:   Skin Assessment: Skin Integrity Issues: ?Skin Integrity Issues:: Incisions ?Incisions: closed rt hip ? ?Last BM:  01/04/22 ? ?Height:  ? ?Ht Readings from Last 1 Encounters:  ?01/02/22 6' (1.829 m)  ? ? ?Weight:  ? ?Wt Readings from Last 1 Encounters:  ?01/02/22 72.6 kg  ? ? ?Ideal Body Weight:  80.9 kg ? ?BMI:  Body mass index is 21.7 kg/m?. ? ?Estimated Nutritional Needs:  ? ?Kcal:  1800-2000 ? ?Protein:  90-105 grams ? ?Fluid:  > 1.8 L ? ? ? ?Loistine Chance, RD, LDN, CDCES ?Registered Dietitian II ?Certified Diabetes Care and Education Specialist ?Please refer to [facility] for RD and/or RD on-call/weekend/after hours pager  ?

## 2022-01-04 NOTE — Evaluation (Addendum)
Physical Therapy Evaluation ?Patient Details ?Name: Jason Warren ?MRN: [id number] ?DOB: [birth year] ?Today's Date: 01/04/2022 ? ?History of Present Illness ? Pt is a 88 y.o. male who presents to the ED s/p fall on 4/1. Pt did report hitting his head against cabinets, but then landed on right side; primary c/o right hip pain. Admitted for Intramedullary nail of R. intertrochantric. PmHx: Arthritis, Dysrhythmia (A-Fib.), HOH, Lung Cancer. ?  ?Clinical Impression ? Pt is awake and alert resting in bed w/ family at bedside, upon PT entrance into room for evaluation today. Pt is A&Ox4 and denies c/o pain at rest. Prior to hospitalization, Pt states he lives in a 1-story home w/ his wife and is independent w/ all ADLs. Uses a SPC at baseline, but has had a few falls over the past few months.  ? ?Pt is able to perform bed mobility w/ minA; reliance on bed rails and PT assistance for trunk elevation. Once seated EOB he is able to perform sit to stand w/ CGA using RW and is able to progress to ambulating ~49ft w/ CGA using RW. In order to conserve energy and prevent significant fatigue, Pt was wheeled in recliner to rehab gym for stairs performance. Pt is able to perform 4 steps w/ CGA using L-sided railing; upon descending Pt becomes noticeably fatigued and pale; Pt helped back into recliner w/ legs up and reclined back, BP obtained ASAP, 154/88, HR and spO2 WFLs. RN notified about BP; strongly encourage to obtain orthostatics w/ Pt mobility. Pt back in room w/ all needs w/in reach and family at bedside prior to PT exiting room. Pt will benefit from continued skilled PT in order to increase LE strength/endurance, improve mobility/balance, and restore PLOF. Current discharge recommendation to HHPT is appropriate due to the level of assistance required by the patient to ensure safety and improve overall function. ? ?   ? ?Recommendations for follow up therapy are one component of a multi-disciplinary discharge planning process,  led by the attending physician.  Recommendations may be updated based on patient status, additional functional criteria and insurance authorization. ? ?Follow Up Recommendations Home health PT ? ?  ?Assistance Recommended at Discharge Intermittent Supervision/Assistance  ?Patient can return home with the following ? A little help with walking and/or transfers;A little help with bathing/dressing/bathroom;Assist for transportation;Assistance with cooking/housework;Help with stairs or ramp for entrance ? ?  ?Equipment Recommendations Rolling walker (2 wheels)  ?Recommendations for Other Services ?    ?  ?Functional Status Assessment Patient has had a recent decline in their functional status and demonstrates the ability to make significant improvements in function in a reasonable and predictable amount of time.  ? ?  ?Precautions / Restrictions Precautions ?Precautions: Fall ?Restrictions ?Weight Bearing Restrictions: Yes ?RLE Weight Bearing: Weight bearing as tolerated  ? ?  ? ?Mobility ? Bed Mobility ?Overal bed mobility: Needs Assistance ?Bed Mobility: Supine to Sit ?  ?  ?Supine to sit: Min assist ?  ?  ?  ?  ? ?Transfers ?Overall transfer level: Needs assistance ?Equipment used: Rolling walker (2 wheels) ?Transfers: Sit to/from Stand ?Sit to Stand: Min guard ?  ?  ?  ?  ?  ?  ?  ? ?Ambulation/Gait ?Ambulation/Gait assistance: Min guard ?Gait Distance (Feet): 40 Feet ?Assistive device: Rolling walker (2 wheels) ?Gait Pattern/deviations: Step-through pattern, Decreased step length - right, Decreased step length - left, Decreased stride length ?Gait velocity: decreased ?  ?  ?  ? ?Stairs ?Stairs: Yes ?Stairs assistance: Min guard ?  Stair Management: One rail Left ?Number of Stairs: 4 ?  ? ?Wheelchair Mobility ?  ? ?Modified Rankin (Stroke Patients Only) ?  ? ?  ? ?Balance Overall balance assessment: Needs assistance ?Sitting-balance support: Feet supported ?Sitting balance-Leahy Scale: Fair ?  ?  ?Standing balance  support: Bilateral upper extremity supported, Reliant on assistive device for balance, During functional activity ?Standing balance-Leahy Scale: Fair ?  ?  ?  ?  ?  ?  ?  ?  ?  ?  ?  ?  ?   ? ? ? ?Pertinent Vitals/Pain Pain Assessment ?Pain Assessment: No/denies pain  ? ? ?Home Living Family/patient expects to be discharged to:: Private residence ?Living Arrangements: Spouse/significant other ?Available Help at Discharge: Family ?Type of Home: House ?Home Access: Stairs to enter ?Entrance Stairs-Rails: Left ?Entrance Stairs-Number of Steps: 4 ?  ?Home Layout: One level ?Home Equipment: Cane - single Associate Professor (2 wheels) ?   ?  ?Prior Function Prior Level of Function : Independent/Modified Independent ?  ?  ?  ?  ?  ?  ?  ?  ?  ? ? ?Hand Dominance  ?   ? ?  ?Extremity/Trunk Assessment  ? Upper Extremity Assessment ?Upper Extremity Assessment: Overall WFL for tasks assessed ?  ? ?Lower Extremity Assessment ?Lower Extremity Assessment: Overall WFL for tasks assessed;Generalized weakness ?  ? ?   ?Communication  ? Communication: HOH  ?Cognition Arousal/Alertness: Awake/alert ?Behavior During Therapy: [facility] for tasks assessed/performed ?Overall Cognitive Status: Within Functional Limits for tasks assessed ?  ?  ?  ?  ?  ?  ?  ?  ?  ?  ?  ?  ?  ?  ?  ?  ?  ?  ?  ? ?  ?General Comments   ? ?  ?Exercises    ? ?Assessment/Plan  ?  ?PT Assessment Patient needs continued PT services  ?PT Problem List Decreased strength;Decreased mobility;Decreased safety awareness;Decreased coordination;Decreased range of motion;Decreased activity tolerance;Decreased balance;Decreased knowledge of use of DME;Pain ? ?   ?  ?PT Treatment Interventions DME instruction;Therapeutic exercise;Gait training;Balance training;Stair training;Neuromuscular re-education;Functional mobility training;Therapeutic activities;Patient/family education   ? ?PT Goals (Current goals can be found in the Care Plan section)  ?Acute Rehab PT  Goals ?Patient Stated Goal: to go home ?PT Goal Formulation: With patient ?Time For Goal Achievement: 01/18/22 ?Potential to Achieve Goals: Fair ? ?  ?Frequency BID ?  ? ? ?Co-evaluation   ?  ?  ?  ?  ? ? ?  ?AM-PAC PT "6 Clicks" Mobility  ?Outcome Measure Help needed turning from your back to your side while in a flat bed without using bedrails?: A Little ?Help needed moving from lying on your back to sitting on the side of a flat bed without using bedrails?: A Little ?Help needed moving to and from a bed to a chair (including a wheelchair)?: A Little ?Help needed standing up from a chair using your arms (e.g., wheelchair or bedside chair)?: A Little ?Help needed to walk in hospital room?: A Little ?Help needed climbing 3-5 steps with a railing? : A Lot ?6 Click Score: 17 ? ?  ?End of Session Equipment Utilized During Treatment: Gait belt ?Activity Tolerance: Patient tolerated treatment well;Patient limited by fatigue ?Patient left: with chair alarm set;in chair;with family/visitor present;with call bell/phone within reach ?Nurse Communication: Mobility status ?PT Visit Diagnosis: Unsteadiness on feet (R26.81);History of falling (Z91.81);Pain;Muscle weakness (generalized) (M62.81) ?Pain - Right/Left: Right ?Pain - part of body:  Hip ?  ? ?Time: 9379-0240 ?PT Time Calculation (min) (ACUTE ONLY): 36 min ? ? ?Charges:     ?  ?  ?   ? ?Jonnie Kind, SPT ?01/04/2022, 12:45 PM ? ?

## 2022-01-04 NOTE — Evaluation (Signed)
Occupational Therapy Evaluation ?Patient Details ?Name: Jason Warren ?MRN: [id number] ?DOB: [birth year] ?Today's Date: 01/04/2022 ? ? ?History of Present Illness Pt is a 88 y.o. male who presents to the ED s/p fall on 4/1. Pt did report hitting his head against cabinets, but then landed on right side; primary c/o right hip pain. Admitted for Intramedullary nail of R. intertrochantric. PmHx: Arthritis, Dysrhythmia (A-Fib.), HOH, Lung Cancer.  ? ?Clinical Impression ?  ?Chart reviewed, RN cleared pt for participation in OT evaluation. Pt is greeted in chair, agreeable to evaluation. Pt is alert and oriented x4, pt and family report no change in cognition since fall. Pt demonstrates good carry over of education provided re: body mechanics and safe RW use during ADL completion. PTA pt is MOD I-I in all ADL/IADL, performs STS with CGA, amb to bathroom with RW with close supervision-CGA, grooming standing at sink level with SET UP. Toilet transfer completed with CGA to [facility] over toilet and RW. Pt will have assistance at home following discharge, is motivated to return home. Pt and family provided education ore: role of OT, role of rehab, home set up/safety, falls prevention, AE use, discharge recommendations. Pt is performing ADL below PLOF and presents with deficits in endurance and balance affecting optimal ADL task completion. Recommend HHOT. OT will continue to follow acutely.  ?   ? ?Recommendations for follow up therapy are one component of a multi-disciplinary discharge planning process, led by the attending physician.  Recommendations may be updated based on patient status, additional functional criteria and insurance authorization.  ? ?Follow Up Recommendations ? Home health OT  ?  ?Assistance Recommended at Discharge Frequent or constant Supervision/Assistance  ?Patient can return home with the following A little help with walking and/or transfers;A little help with bathing/dressing/bathroom;Assistance with  cooking/housework;Assistance with feeding;Help with stairs or ramp for entrance ? ?  ?Functional Status Assessment ? Patient has had a recent decline in their functional status and demonstrates the ability to make significant improvements in function in a reasonable and predictable amount of time.  ?Equipment Recommendations ? None recommended by OT;Other (comment) (pt has recommended equipment)  ?  ?Recommendations for Other Services   ? ? ?  ?Precautions / Restrictions Precautions ?Precautions: Fall ?Precaution Comments: watch orthostatics ?Restrictions ?Weight Bearing Restrictions: Yes ?RLE Weight Bearing: Weight bearing as tolerated  ? ?  ? ?Mobility Bed Mobility ?  ?  ?  ?  ?  ?  ?  ?General bed mobility comments: recieved in chair at start and back to chair post eval ?  ? ?Transfers ?Overall transfer level: Needs assistance ?Equipment used: Rolling walker (2 wheels) ?Transfers: Sit to/from Stand ?Sit to Stand: Min guard ?  ?  ?  ?  ?  ?General transfer comment: vcs for body mechanics ?  ? ?  ?Balance Overall balance assessment: Needs assistance ?Sitting-balance support: Feet supported ?Sitting balance-Leahy Scale: Fair ?  ?  ?Standing balance support: Bilateral upper extremity supported, Reliant on assistive device for balance, During functional activity ?Standing balance-Leahy Scale: Fair ?  ?  ?  ?  ?  ?  ?  ?  ?  ?  ?  ?  ?   ? ?ADL either performed or assessed with clinical judgement  ? ?ADL Overall ADL's : Needs assistance/impaired ?Eating/Feeding: Set up;Sitting ?  ?Grooming: Oral care;Standing ?Grooming Details (indicate cue type and reason): with RW at sink level; vcs for safety with good carry over noted ?  ?  ?  ?  ?  ?  ?  Lower Body Dressing: Maximal assistance ?  ?Toilet Transfer: Min guard;BSC/3in1;Ambulation;Rolling walker (2 wheels) ?  ?  ?  ?  ?  ?Functional mobility during ADLs: Supervision/safety;Min guard;Rolling walker (2 wheels) ?General ADL Comments: vcs for pacing, RW use; good carry over  noted  ? ? ? ?Vision Patient Visual Report: No change from baseline ?   ?   ?Perception   ?  ?Praxis   ?  ? ?Pertinent Vitals/Pain Pain Assessment ?Pain Assessment: 0-10 ?Pain Score: 6  ?Pain Location: R hip ?Pain Descriptors / Indicators: Aching, Discomfort, Grimacing ?Pain Intervention(s): Limited activity within patient's tolerance, Monitored during session, Repositioned  ? ? ? ?Hand Dominance   ?  ?Extremity/Trunk Assessment Upper Extremity Assessment ?Upper Extremity Assessment: Overall WFL for tasks assessed ?  ?Lower Extremity Assessment ?Lower Extremity Assessment: Defer to PT evaluation ?  ?  ?  ?Communication Communication ?Communication: HOH ?  ?Cognition Arousal/Alertness: Awake/alert ?Behavior During Therapy: [facility] for tasks assessed/performed ?Overall Cognitive Status: Within Functional Limits for tasks assessed ?  ?  ?  ?  ?  ?  ?  ?  ?  ?  ?  ?  ?  ?  ?  ?  ?  ?  ?  ?General Comments  BP 127/67 in sitting, 102/88 standing after 3 minutes, 120/53 back seated ? ?  ?Exercises Other Exercises ?Other Exercises: edu re: role of OT, role of rehab, home set up/safety, falls prevention, AE use, discharge recommendations ?  ?Shoulder Instructions    ? ? ?Home Living Family/patient expects to be discharged to:: Private residence ?Living Arrangements: Spouse/significant other ?Available Help at Discharge: Family;Available 24 hours/day ?Type of Home: House ?Home Access: Stairs to enter ?Entrance Stairs-Number of Steps: 4 ?Entrance Stairs-Rails: Left ?Home Layout: One level ?  ?  ?Bathroom Shower/Tub: Other (comment) (shower with approx 4 inch lip with built in shower chair) ?  ?  ?Bathroom Accessibility: Yes ?  ?Home Equipment: Cane - single Associate Professor (2 wheels) ?  ?  ?  ? ?  ?Prior Functioning/Environment Prior Level of Function : Independent/Modified Independent ?  ?  ?  ?  ?  ?  ?Mobility Comments: mod I-I with SPC in morning and at night, typically no AD use ?ADLs Comments: MOD I-I in all  ADL/IADL; pt drives, grocery shops, assists with cooking/cleaning; goes to the gym daily ?  ? ?  ?  ?OT Problem List: Impaired balance (sitting and/or standing);Decreased activity tolerance;Decreased knowledge of use of DME or AE ?  ?   ?OT Treatment/Interventions: Self-care/ADL training;Therapeutic exercise;Patient/family education;Balance training;Therapeutic activities;Energy conservation;DME and/or AE instruction  ?  ?OT Goals(Current goals can be found in the care plan section) Acute Rehab OT Goals ?Patient Stated Goal: go home ?OT Goal Formulation: With patient ?Time For Goal Achievement: 01/18/22 ?Potential to Achieve Goals: Good ?ADL Goals ?Pt Will Perform Grooming: with modified independence;standing;sitting ?Pt Will Perform Upper Body Dressing: with modified independence;sitting ?Pt Will Perform Lower Body Dressing: with modified independence;with adaptive equipment;sit to/from stand ?Pt Will Transfer to Toilet: with modified independence;ambulating;bedside commode ?Pt Will Perform Toileting - Clothing Manipulation and hygiene: with modified independence;sit to/from stand;with adaptive equipment  ?OT Frequency: Min 3X/week ?  ? ?Co-evaluation   ?  ?  ?  ?  ? ?  ?AM-PAC OT "6 Clicks" Daily Activity     ?Outcome Measure Help from another person eating meals?: None ?Help from another person taking care of personal grooming?: None ?Help from another person toileting, which includes using toliet,  bedpan, or urinal?: A Little ?Help from another person bathing (including washing, rinsing, drying)?: A Little ?Help from another person to put on and taking off regular upper body clothing?: A Little ?Help from another person to put on and taking off regular lower body clothing?: A Lot ?6 Click Score: 19 ?  ?End of Session Equipment Utilized During Treatment: Gait belt;Rolling walker (2 wheels) ?Nurse Communication: Mobility status ? ?Activity Tolerance: Patient tolerated treatment well ?Patient left: in chair;with  call bell/phone within reach;with chair alarm set;with family/visitor present ? ?OT Visit Diagnosis: History of falling (Z91.81);Unsteadiness on feet (R26.81)  ?              ?Time: 6270-3500 ?OT Time Calculation

## 2022-01-04 NOTE — Telephone Encounter (Signed)
Daughter called to report that patient fell and fractured his hip Saturday and had surgery. Patient wants Dr Baruch Gouty to know that he thinks it was related to the seizure ?

## 2022-01-04 NOTE — Progress Notes (Signed)
?Progress Note ? ? ? ?Jason Warren   ?SFK:[id number]  ?DOB: [birth year]  ?DOA: 01/02/2022     1 ?PCP: Jason Hire, MD ? ?Initial CC: fall at home ? ?Hospital Course: ?Jason Warren is a 88 year old male with PMH lung cancer with brain mets, CAD, chronic A-fib who presented to the hospital after a fall at home.  He was sent to the ER for further evaluation.  Imaging showed intertrochanteric right hip fracture. ?He also has been having some swelling in his right hand third digit and underwent CT of the hand which showed soft tissue swelling particularly around the right third PIP with no surrounding fracture or osseous erosion. ? 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 Patient has history of A-fib and is on Eliquis and sees Dr. Nehemiah Massed last echocardiogram in 2020 to February showed ejection fraction of 55 to 60%.  Patient has Mediport for access. ? ?He was evaluated by orthopedic surgery on admission and consented for undergoing surgical repair. ? ?Interval History:  ?No events overnight.  He was feeling well this morning and motivated for going home however he had some significant dizziness when ambulating with PT although this might not be too different than how he feels at home at times in setting of his underlying malignancy. ?We elected to keep him in the hospital 1 more night for ongoing work with PT as plan is for him to go home at discharge with home health and he would benefit from further inpatient physical therapy. ? ?Assessment and Plan: ?* Closed right hip fracture (HCC) ?- S/p mechanical fall at home ?- Imaging shows comminuted  angulated intertrochanteric right hip fracture with avulsion of the lesser trochanter ?- Evaluated by orthopedic surgery and underwent ORIF on 01/03/2022 ? ?Dizziness ?- Continue fluids given concern for some possible dehydration on admission ?- Continue on telemetry ? ?Pain of right middle finger ?- Swelling noted on CT of hand.  He had been on doxycycline outpatient and NSAIDs.  No fracture or infection appreciated on CT hand ?- Follow-up any further orthopedic recommendations; okay to keep fingers splinted for comfort ?- Discontinue antibiotics and monitor ? ?Scalp laceration ?- Due to fall.  Wound inspected and currently covered with Tegaderm.  Not large or deep enough to require any staples or surgical glue at this time ?- Continue dressing, can apply smaller bandage if needed for comfort ?-Neosporin ordered ? ?Fall at home, initial encounter ?- Possibly related to volume depletion however may also have some contribution from his right frontal brain metastasis with vasogenic edema.   ?- Continue working with PT ? ? ? ?Persistent atrial fibrillation ([place]) ?- Rate controlled, no medication noted on med rec ?-Eliquis resumed on 01/04/2022 ? ? 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  Confirmed with pharmacy ? ? ?Old records reviewed in assessment of this patient ? ?Antimicrobials: ? ? ?DVT prophylaxis:  ?SCDs Start: 01/03/22 1500 ?apixaban (ELIQUIS) tablet 5 mg  ? ?Code Status:   Code Status: Full Code ? ?Disposition Plan: Home with home health on Tuesday ?Status is: Inpatient ? ?Objective: ?Blood pressure 121/62, pulse 73,  temperature (!) 97.5 ?F (36.4 ?C), resp. rate 17, height 6' (1.829 m), weight 72.6 kg, SpO2 98 %.  ?Examination:  ?Physical Exam ?Constitutional:   ?   General: He is not in acute distress. ?   Appearance: Normal appearance.  ?HENT:  ?   Head: Normocephalic and atraumatic.  ?   Comments: Small right posterior scalp lac noted  ?   Mouth/Throat:  ?   Mouth: Mucous membranes are moist.  ?Eyes:  ?   Extraocular Movements: Extraocular movements intact.  ?Cardiovascular:  ?   Rate and Rhythm: Normal rate and regular rhythm.  ?   Heart sounds: Normal heart sounds.  ?Pulmonary:  ?   Effort: Pulmonary effort is normal. No respiratory distress.  ?   Breath sounds: Normal breath sounds. No wheezing.  ?Abdominal:  ?   General: Bowel sounds are normal. There is no distension.  ?   Palpations: Abdomen is soft.  ?   Tenderness: There is no abdominal tenderness.  ?Musculoskeletal:     ?   General: Normal range of motion.  ?   Cervical back: Normal range of motion and neck supple.  ?   Comments: Right hand 3rd/4th digits splinted together; right hip surgical dressings noted and clean/dry. Compartment is soft but swollen. TTP as expected  ?Skin: ?   General: Skin is warm and dry.  ?Neurological:  ?   General: No focal deficit present.  ?   Mental Status: He is alert.  ?Psychiatric:     ?   Mood and Affect: Mood normal.     ?   Behavior: Behavior normal.  ?  ? ?Consultants:  ?Orthopedic surgery ? ?Procedures:  ?Reduction and internal fixation of displaced intertrochanteric right hip fracture with Biomet Affixis TFN nail., 01/03/22 ? ?Data Reviewed: ?Results for orders placed or performed during the hospital encounter of 01/02/22 (from the past 24 hour(s))  ?Basic metabolic panel     Status: Abnormal  ? Collection Time: 01/04/22  7:15 AM  ?Result Value Ref Range  ? Sodium 132 (L) 135 - 145 mmol/L  ? Potassium 5.0 3.5 - 5.1 mmol/L  ? Chloride 103 98 - 111 mmol/L  ? CO2 23 22 - 32 mmol/L  ? Glucose, Bld 148 (H) 70 - 99 mg/dL  ? BUN 28 (H)  8 - 23 mg/dL  ? Creatinine, Ser 1.20 0.61 - 1.24 mg/dL  ? Calcium 7.6 (L) 8.9 - 10.3 mg/dL  ? GFR, Estimated 57 (L) >60 mL/min  ? Anion gap 6 5 - 15  ?CBC     Status: Abnormal  ? Collection Time: 01/04/22  7:15 AM  ?Result Value Ref Range  ? WBC 10.6 (H) 4.0 - 10.5 K/uL  ? RBC 3.27 (L) 4.22 - 5.81 MIL/uL  ? Hemoglobin 10.6 (L) 13.0 - 17.0 g/dL  ? HCT 31.2 (L) 39.0 - 52.0 %  ? MCV 95.4 80.0 - 100.0 fL  ? MCH 32.4 26.0 - 34.0 pg  ? MCHC 34.0 30.0 - 36.0 g/dL  ? RDW 14.6 11.5 - 15.5 %  ? Platelets 124 (L) 150 - 400 K/uL  ? nRBC 0.0 0.0 -  0.2 %  ?  ?I have Reviewed nursing notes, Vitals, and Lab results since pt's last encounter. Pertinent lab results : see above ?I have ordered test including BMP, CBC, Mg ?I have reviewed the last note from staff over past 24 hours ?I have discussed pt's care plan and test results with nursing staff, case manager ? ? LOS: 1 day  ? ?Dwyane Dee, MD ?[facility] ?01/04/2022, 1:55 PM ? ?

## 2022-01-05 ENCOUNTER — Encounter: Payer: Self-pay | Admitting: Surgery

## 2022-01-05 DIAGNOSIS — S72001A Fracture of unspecified part of neck of right femur, initial encounter for closed fracture: Secondary | ICD-10-CM | POA: Diagnosis not present

## 2022-01-05 LAB — BASIC METABOLIC PANEL
Anion gap: 7 (ref 5–15)
BUN: 32 mg/dL — ABNORMAL HIGH (ref 8–23)
CO2: 24 mmol/L (ref 22–32)
Calcium: 7.5 mg/dL — ABNORMAL LOW (ref 8.9–10.3)
Chloride: 104 mmol/L (ref 98–111)
Creatinine, Ser: 0.99 mg/dL (ref 0.61–1.24)
GFR, Estimated: >60 mL/min (ref >60)
Glucose, Bld: 101 mg/dL — ABNORMAL HIGH (ref 70–99)
Potassium: 4.1 mmol/L (ref 3.5–5.1)
Sodium: 135 mmol/L (ref 135–145)

## 2022-01-05 LAB — CBC
HCT: 27.4 % — ABNORMAL LOW (ref 39.0–52.0)
Hemoglobin: 9.3 g/dL — ABNORMAL LOW (ref 13.0–17.0)
MCH: 32.4 pg (ref 26.0–34.0)
MCHC: 33.9 g/dL (ref 30.0–36.0)
MCV: 95.5 fL (ref 80.0–100.0)
Platelets: 140 10*3/uL — ABNORMAL LOW (ref 150–400)
RBC: 2.87 MIL/uL — ABNORMAL LOW (ref 4.22–5.81)
RDW: 14.7 % (ref 11.5–15.5)
WBC: 8.5 10*3/uL (ref 4.0–10.5)
nRBC: 0 % (ref 0.0–0.2)

## 2022-01-05 LAB — MAGNESIUM: Magnesium: 2.2 mg/dL (ref 1.7–2.4)

## 2022-01-05 MED ORDER — DOCUSATE SODIUM 100 MG PO CAPS: 100.0000 mg | ORAL_CAPSULE | Freq: Two times a day (BID) | ORAL | 0 refills | Status: DC

## 2022-01-05 MED ORDER — ACETAMINOPHEN 500 MG PO TABS: 500.0000 mg | ORAL_TABLET | Freq: Four times a day (QID) | ORAL | 0 refills | Status: DC | PRN

## 2022-01-05 MED ORDER — HYDROCODONE-ACETAMINOPHEN 5-325 MG PO TABS: 1.0000 | ORAL_TABLET | Freq: Four times a day (QID) | ORAL | 0 refills | Status: DC | PRN

## 2022-01-05 MED ORDER — BACITRACIN-NEOMYCIN-POLYMYXIN 400-5-5000 EX OINT: TOPICAL_OINTMENT | Freq: Every day | CUTANEOUS | 0 refills | Status: DC

## 2022-01-05 MED ORDER — ONDANSETRON HCL 4 MG PO TABS: 4.0000 mg | ORAL_TABLET | Freq: Four times a day (QID) | ORAL | 0 refills | Status: DC | PRN

## 2022-01-05 NOTE — Progress Notes (Signed)
Met with the patient in the room at the bedside ?They live at at home ?They currently have DME listed rolling walker shower seat and 3 in 1 ?They need  no additional DME ?They have transportation with family ?They can afford their medication ?They are set up with [facility] for Home health services  ?

## 2022-01-05 NOTE — Discharge Instructions (Addendum)
Diet: As you were doing prior to hospitalization  ? ?Shower:  May shower but keep the wounds dry, use an occlusive plastic wrap, NO SOAKING IN TUB.  If the bandage gets wet, change with a clean dry gauze. ? ?Dressing:  You may change your dressing as needed. Change the dressing with sterile gauze dressing.   ? ?Activity:  Increase activity slowly as tolerated, but follow the weight bearing instructions below.  No lifting or driving for 6 weeks. ? ?Weight Bearing:   Weight bearing as tolerated to right lower extremity ? ?To prevent constipation: you may use a stool softener such as - ? ?Colace (over the counter) 100 mg by mouth twice a day  ?Drink plenty of fluids (prune juice may be helpful) and high fiber foods ?Miralax (over the counter) for constipation as needed.   ? ?Itching:  If you experience itching with your medications, try taking only a single pain pill, or even half a pain pill at a time.  You may take up to 10 pain pills per day, and you can also use benadryl over the counter for itching or also to help with sleep.  ? ?Precautions:  If you experience chest pain or shortness of breath - call 911 immediately for transfer to the hospital emergency department!! ? ?If you develop a fever greater that 101 F, purulent drainage from wound, increased redness or drainage from wound, or calf pain-Call [facility]                                              ?Follow- Up Appointment:  Please call for an appointment to be seen in 2 weeks at [facility]  ? ?Pt to f/u Oncology at [facility] cancer center on your appt ?

## 2022-01-05 NOTE — Progress Notes (Signed)
Discharge instructions reviewed with patient and daughters including followup visits and new medications.  Understanding was verbalized and all questions were answered.  IV removed without complication; patient tolerated well.  Patient discharged home via wheelchair in stable condition escorted by volunteer staff. ? ?

## 2022-01-05 NOTE — Progress Notes (Signed)
Physical Therapy Treatment ?Patient Details ?Name: Jason Warren ?MRN: [id number] ?DOB: [birth year] ?Today's Date: 01/05/2022 ? ? ?History of Present Illness Pt is a 88 y.o. male who presents to the ED s/p fall on 4/1. Pt did report hitting his head against cabinets, but then landed on right side; primary c/o right hip pain. Admitted for Intramedullary nail of R. intertrochantric. PmHx: Arthritis, Dysrhythmia (A-Fib.), HOH, Lung Cancer. ? ?  ?PT Comments  ? ? Pt was pleasant and motivated to participate during the session and put forth good effort throughout. Pt required extra time and effort with sit to/from stands from multiple surfaces with cuing for increased trunk flexion and hand placement.  Pt was steady with amb and stair training with cues for general sequencing with daughter present for training. Pt will benefit from HHPT upon discharge to safely address deficits listed in patient problem list for decreased caregiver assistance and eventual return to PLOF. ? ?   ?Recommendations for follow up therapy are one component of a multi-disciplinary discharge planning process, led by the attending physician.  Recommendations may be updated based on patient status, additional functional criteria and insurance authorization. ? ?Follow Up Recommendations ? Home health PT ?  ?  ?Assistance Recommended at Discharge Frequent or constant Supervision/Assistance  ?Patient can return home with the following A little help with walking and/or transfers;A little help with bathing/dressing/bathroom;Assist for transportation;Assistance with cooking/housework;Help with stairs or ramp for entrance ?  ?Equipment Recommendations ? Rolling walker (2 wheels)  ?  ?Recommendations for Other Services   ? ? ?  ?Precautions / Restrictions Precautions ?Precautions: Fall ?Restrictions ?Weight Bearing Restrictions: Yes ?RLE Weight Bearing: Weight bearing as tolerated  ?  ? ?Mobility ? Bed Mobility ?  ?  ?  ?  ?  ?  ?  ?General bed mobility  comments: NT, in recliner ?  ? ?Transfers ?Overall transfer level: Needs assistance ?Equipment used: Rolling walker (2 wheels) ?Transfers: Sit to/from Stand ?Sit to Stand: Min guard ?  ?  ?  ?  ?  ?General transfer comment: Mod verbal and visual cues for increased trunk flexion ?  ? ?Ambulation/Gait ?Ambulation/Gait assistance: Min guard ?Gait Distance (Feet): 10 Feet ?Assistive device: Rolling walker (2 wheels) ?Gait Pattern/deviations: Step-to pattern, Decreased stance time - right, Decreased step length - left, Antalgic ?Gait velocity: decreased ?  ?  ?General Gait Details: Amb distance limited this session by pt needing a BM at end of session ? ? ?Stairs ?Stairs: Yes ?Stairs assistance: Min guard ?Stair Management: One rail Left, Step to pattern, Forwards ?Number of Stairs: 4 ?General stair comments: Mod verbal cues for general sequencing with dtr present for training ? ? ?Wheelchair Mobility ?  ? ?Modified Rankin (Stroke Patients Only) ?  ? ? ?  ?Balance Overall balance assessment: Needs assistance ?Sitting-balance support: Feet supported ?Sitting balance-Leahy Scale: Fair ?  ?  ?Standing balance support: Bilateral upper extremity supported, Reliant on assistive device for balance, During functional activity ?Standing balance-Leahy Scale: Fair ?  ?  ?  ?  ?  ?  ?  ?  ?  ?  ?  ?  ?  ? ?  ?Cognition Arousal/Alertness: Awake/alert ?Behavior During Therapy: [facility] for tasks assessed/performed ?Overall Cognitive Status: Within Functional Limits for tasks assessed ?  ?  ?  ?  ?  ?  ?  ?  ?  ?  ?  ?  ?  ?  ?  ?  ?  ?  ?  ? ?  ?  Exercises Other Exercises ?Other Exercises: Transfer training to/from various height surfaces with cues for increased trunk flexion during both eccentric and concentric phases ?Other Exercises: Car transfer sequencing education provided to pt and dtr ? ?  ?General Comments   ?  ?  ? ?Pertinent Vitals/Pain Pain Assessment ?Pain Assessment: 0-10 ?Pain Score: 4  ?Pain Location: R hip ?Pain Descriptors  / Indicators: Aching, Discomfort, Grimacing ?Pain Intervention(s): Repositioned, Premedicated before session, Monitored during session  ? ? ?Home Living   ?  ?  ?  ?  ?  ?  ?  ?  ?  ?   ?  ?Prior Function    ?  ?  ?   ? ?PT Goals (current goals can now be found in the care plan section) Progress towards PT goals: Progressing toward goals ? ?  ?Frequency ? ? ? BID ? ? ? ?  ?PT Plan Current plan remains appropriate  ? ? ?Co-evaluation   ?  ?  ?  ?  ? ?  ?AM-PAC PT "6 Clicks" Mobility   ?Outcome Measure ? Help needed turning from your back to your side while in a flat bed without using bedrails?: A Little ?Help needed moving from lying on your back to sitting on the side of a flat bed without using bedrails?: A Little ?Help needed moving to and from a bed to a chair (including a wheelchair)?: A Little ?Help needed standing up from a chair using your arms (e.g., wheelchair or bedside chair)?: A Little ?Help needed to walk in hospital room?: A Little ?Help needed climbing 3-5 steps with a railing? : A Little ?6 Click Score: 18 ? ?  ?End of Session Equipment Utilized During Treatment: Gait belt ?Activity Tolerance: Patient tolerated treatment well ?Patient left: with family/visitor present;Other (comment) (Pt left in BR for BM with dtr present and education provided to pull cord when ready) ?Nurse Communication: Mobility status ?PT Visit Diagnosis: Unsteadiness on feet (R26.81);History of falling (Z91.81);Pain;Muscle weakness (generalized) (M62.81) ?Pain - Right/Left: Right ?Pain - part of body: Hip ?  ? ? ?Time: 0930-1003 ?PT Time Calculation (min) (ACUTE ONLY): 33 min ? ?Charges:  $Gait Training: 8-22 mins ?$Therapeutic Activity: 8-22 mins          ?          ?D. Royetta Asal PT, DPT ?01/05/22, 11:07 AM ? ? ?

## 2022-01-05 NOTE — Progress Notes (Signed)
Occupational Therapy Treatment ?Patient Details ?Name: Jason Warren ?MRN: [id number] ?DOB: [birth year] ?Today's Date: 01/05/2022 ? ? ?History of present illness Pt is a 88 y.o. male who presents to the ED s/p fall on 4/1. Pt did report hitting his head against cabinets, but then landed on right side; primary c/o right hip pain. Admitted for Intramedullary nail of R. intertrochantric. PmHx: Arthritis, Dysrhythmia (A-Fib.), HOH, Lung Cancer. ?  ?OT comments ? Chart reviewed, pt greeted in chair with daughters present. Tx session targeted progressing safe functional mobility and ADL completed to facilitate safe discharge home and return to PLOF. Pt with increased tolerance and making progress towards all goals set fourth. Please see below. Education provided to pt and daughters re: use of AE for falls prevention during dressing tasks. Pt is left as received, NAD, all needs met. OT will continue to follow.  ? ?Recommendations for follow up therapy are one component of a multi-disciplinary discharge planning process, led by the attending physician.  Recommendations may be updated based on patient status, additional functional criteria and insurance authorization. ?   ?Follow Up Recommendations ? Home health OT  ?  ?Assistance Recommended at Discharge Frequent or constant Supervision/Assistance  ?Patient can return home with the following ? A little help with walking and/or transfers;A little help with bathing/dressing/bathroom;Assistance with cooking/housework;Assistance with feeding;Help with stairs or ramp for entrance ?  ?Equipment Recommendations ? None recommended by OT  ?  ?Recommendations for Other Services   ? ?  ?Precautions / Restrictions Precautions ?Precautions: Fall ?Restrictions ?Weight Bearing Restrictions: Yes ?RLE Weight Bearing: Weight bearing as tolerated  ? ? ?  ? ?Mobility Bed Mobility ?  ?  ?  ?  ?  ?  ?  ?General bed mobility comments: NT in recliner ?  ? ?Transfers ?Overall transfer level: Needs  assistance ?Equipment used: Rolling walker (2 wheels) ?Transfers: Sit to/from Stand ?Sit to Stand: Min guard ?  ?  ?  ?  ?  ?General transfer comment: good carry over from education from PT noted with body mechanics ?  ?  ?Balance Overall balance assessment: Needs assistance ?Sitting-balance support: Feet supported ?Sitting balance-Leahy Scale: Fair ?  ?  ?Standing balance support: Bilateral upper extremity supported, Reliant on assistive device for balance, During functional activity ?Standing balance-Leahy Scale: Fair ?  ?  ?  ?  ?  ?  ?  ?  ?  ?  ?  ?  ?   ? ?ADL either performed or assessed with clinical judgement  ? ?ADL Overall ADL's : Needs assistance/impaired ?  ?  ?  ?  ?  ?  ?  ?  ?  ?  ?  ?  ?  ?  ?  ?  ?  ?  ?  ?General ADL Comments: STS from bedside chair with CGA with RW, amb to bathroom with close supervision with RW, seated bathing tasks with MIN A for UB, MOD A for LB with STS requring CGA from BSC, UB dressing with SET UP, LB dressing with MAX A, educated pt and daugthers on use of AE for dressing ?  ? ?Extremity/Trunk Assessment   ?  ?  ?  ?  ?  ? ?Vision   ?  ?  ?Perception   ?  ?Praxis   ?  ? ?Cognition Arousal/Alertness: Awake/alert ?Behavior During Therapy: [facility] for tasks assessed/performed ?Overall Cognitive Status: Within Functional Limits for tasks assessed ?  ?  ?  ?  ?  ?  ?  ?  ?  ?  ?  ?  ?  ?  ?  ?  ?  ?  ?  ?   ?  Exercises Other Exercises ?Other Exercises: edu re: LB dressing with AE ? ?  ?Shoulder Instructions   ? ? ?  ?General Comments    ? ? ?Pertinent Vitals/ Pain       Pain Assessment ?Pain Assessment: 0-10 ?Pain Score: 3  ?Pain Location: R hip ?Pain Descriptors / Indicators: Aching, Discomfort, Grimacing ?Pain Intervention(s): Limited activity within patient's tolerance, Monitored during session, Repositioned ? ?Home Living   ?  ?  ?  ?  ?  ?  ?  ?  ?  ?  ?  ?  ?  ?  ?  ?  ?  ?  ? ?  ?Prior Functioning/Environment    ?  ?  ?  ?   ? ?Frequency ? Min 3X/week  ? ? ? ? ?  ?Progress  Toward Goals ? ?OT Goals(current goals can now be found in the care plan section) ? Progress towards OT goals: Progressing toward goals ? ?Acute Rehab OT Goals ?Patient Stated Goal: go home ?OT Goal Formulation: With patient ?Time For Goal Achievement: 01/19/22 ?Potential to Achieve Goals: Good  ?Plan     ? ?Co-evaluation ? ? ?   ?  ?  ?  ?  ? ?  ?AM-PAC OT "6 Clicks" Daily Activity     ?Outcome Measure ? ? Help from another person eating meals?: None ?Help from another person taking care of personal grooming?: None ?Help from another person toileting, which includes using toliet, bedpan, or urinal?: A Little ?Help from another person bathing (including washing, rinsing, drying)?: A Little ?Help from another person to put on and taking off regular upper body clothing?: None ?Help from another person to put on and taking off regular lower body clothing?: A Lot ?6 Click Score: 20 ? ?  ?End of Session Equipment Utilized During Treatment: Gait belt;Rolling walker (2 wheels) ? ?OT Visit Diagnosis: History of falling (Z91.81);Unsteadiness on feet (R26.81) ?  ?Activity Tolerance Patient tolerated treatment well ?  ?Patient Left in chair;with call bell/phone within reach;with family/visitor present ?  ?Nurse Communication Mobility status ?  ? ?   ? ?Time: 0131-4388 ?OT Time Calculation (min): 24 min ? ?Charges: OT General Charges ?$OT Visit: 1 Visit ?OT Treatments ?$Self Care/Home Management : 23-37 mins ? ?Shanon Payor, OTD OTR/L  ?01/05/22, 1:17 PM  ?

## 2022-01-05 NOTE — Progress Notes (Signed)
?  Subjective: ?2 Days Post-Op Procedure(s) (LRB): ?INTRAMEDULLARY (IM) NAIL INTERTROCHANTRIC (Right) ?Patient reports pain as moderate this morning but has done extremely well with therapy. ?Has cleared all goals with PT. ?Patient is well, and has had no acute complaints or problems ?PT and care management to assist with discharge planning.  Plan is for discharge home with HHPT. ?Patient lives at home independently. ?Negative for chest pain and shortness of breath ?Fever: no ?Gastrointestinal:Negative for nausea and vomiting ?He reports that he is passing gas this morning.  He has had a BM. ? ?Objective: ?Vital signs in last 24 hours: ?Temp:  [96.9 ?F (36.1 ?C)-98.7 ?F (37.1 ?C)] 96.9 ?F (36.1 ?C) (04/04 7893) ?Pulse Rate:  [70-82] 71 (04/04 0808) ?Resp:  [16-20] 18 (04/04 8101) ?BP: (103-152)/(46-106) 106/60 (04/04 7510) ?SpO2:  [94 %-100 %] 99 % (04/04 0808) ? ?Intake/Output from previous day: ? ?Intake/Output Summary (Last 24 hours) at 01/05/2022 1047 ?Last data filed at 01/05/2022 0513 ?Gross per 24 hour  ?Intake --  ?Output 700 ml  ?Net -700 ml  ?  ?Intake/Output this shift: ?No intake/output data recorded. ? ?Labs: ?Recent Labs  ?  01/02/22 ?1832 01/03/22 ?2585 01/04/22 ?0715 01/05/22 ?0404  ?HGB 14.8 12.8* 10.6* 9.3*  ? ?Recent Labs  ?  01/04/22 ?0715 01/05/22 ?0404  ?WBC 10.6* 8.5  ?RBC 3.27* 2.87*  ?HCT 31.2* 27.4*  ?PLT 124* 140*  ? ?Recent Labs  ?  01/04/22 ?0715 01/05/22 ?0404  ?NA 132* 135  ?K 5.0 4.1  ?CL 103 104  ?CO2 23 24  ?BUN 28* 32*  ?CREATININE 1.20 0.99  ?GLUCOSE 148* 101*  ?CALCIUM 7.6* 7.5*  ? ?No results for input(s): LABPT, INR in the last 72 hours. ? ? ?EXAM ?General - Patient is Alert, Appropriate, and Oriented ?Extremity - ABD soft ?Neurovascular intact ?Intact pulses distally ?Dorsiflexion/Plantar flexion intact ?Incision: scant drainage ?No cellulitis present ?Compartment soft ?Dressing/Incision - Bloody drainage noted to the proximal hip incision. ?Motor Function - intact, moving foot and  toes well on exam.  ?Intact bowel sounds. ? ?Past Medical History:  ?Diagnosis Date  ? Arthritis   ? Dysrhythmia   ? A-fib  ? Hip pain   ? left  ? HOH (hard of hearing)   ? LBBB (left bundle branch block) 08/05/2020  ? Lung cancer ([place])   ? Skin cancer, basal cell   ?  face top of head  ? ? ?Assessment/Plan: ?2 Days Post-Op Procedure(s) (LRB): ?INTRAMEDULLARY (IM) NAIL INTERTROCHANTRIC (Right) ?Principal Problem: ?  Closed right hip fracture ([place]) ?Active Problems: ?  Stage 4 lung cancer, left ([place]) ?  Pain of right middle finger ?  Dizziness ?  Persistent atrial fibrillation ([place]) ?  Fall at home, initial encounter ?  Scalp laceration ? ?Estimated body mass index is 21.7 kg/m? as calculated from the following: ?  Height as of this encounter: 6' (1.829 m). ?  Weight as of this encounter: 72.6 kg. ?Advance diet ?Up with therapy ?D/C IV fluids when tolerating po intake. ? ?Labs reviewed this AM, Hg 9.3.  WBC 8.2. ?Na 135. ?Patient has cleared all goals with PT.  Plan is for discharge home with HHPT. ?Patient has had a BM.  Resume Eliquis at this time. ? ?Plan for follow-up with [facility] orthopaedics in 10-14 days for staple removal. ? ?DVT Prophylaxis -  SCDs and Eliquis ?Weight-Bearing as tolerated to right leg ? ?Raquel Jennings Stirling, PA-C ?[facility] ?01/05/2022, 10:47 AM  ?

## 2022-01-05 NOTE — Discharge Summary (Signed)
?Physician Discharge Summary ?  ?Patient: Jason Warren MRN: [id number] DOB: [birth year]  ?Admit date:     01/02/2022  ?Discharge date: 01/05/22  ?Discharge Physician: Fritzi Mandes  ? ?PCP: Baxter Hire, MD  ? ?Recommendations at discharge:  ? ?follow-up orthopedic in two weeks ?follow-up PCP in 1 to 2 weeks ?patient to keep up with his appointment at the cancer center on his schedule ? ?Discharge Diagnoses: ?close right hip fracture status post surgery ? ?Hospital Course: ? ? Closed right hip fracture (HCC ?--S/p mechanical fall at home ?- Imaging shows comminuted angulated intertrochanteric right hip fracture with avulsion of the lesser trochanter ?- Evaluated by orthopedic surgery and underwent ORIF on 01/03/2022 ?-- ET recommends home health PT-- arranged ?  ?Pain of right middle finger ?- Swelling noted on CT of hand.  He had been on doxycycline outpatient and NSAIDs.  No fracture or infection appreciated on CT hand ?- Follow-up any further orthopedic recommendations; okay to keep fingers splinted for comfort ?- Discontinued antibiotics  ?  ?Scalp laceration ?- Due to fall.  Wound inspected and currently covered with Tegaderm.  Not large or deep enough to require any staples or surgical glue at this time ?- Continue dressing, can apply smaller bandage if needed for comfort ?-Neosporin ordered ?  ?Fall at home, initial encounter ?- Possibly related to volume depletion however may also have some contribution from his right frontal brain metastasis with vasogenic edema.   ?- Continue working with PT-- recommended home health PT ?  ? Persistent atrial fibrillation ([place]) ?- Rate controlled, no medication noted on med rec ?-Eliquis resumed on 01/04/2022 ?  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  Confirmed with family ? ? ?overall hemodynamically stable. Discharge to home. Discussed with patient and family in the room they are in agreement. Home health arranged. ? ?  ? ? ?Consultants: Orthopedic ?Procedures performed: right hip surgery  ?Disposition: Skilled nursing facility ?Diet recommendation:  ?Discharge Diet Orders (From admission, onward)  ? ?  Start     Ordered  ? 01/05/22 0000  Diet - low sodium heart healthy       ? 01/05/22 1148  ? ?  ?  ? ?  ? ?Cardiac diet ?DISCHARGE MEDICATION: ?Allergies as of 01/05/2022   ?No Known Allergies ?  ? ?  ?Medication List  ?  ? ?STOP taking these medications   ? ?dexamethasone 2 MG tablet ?Commonly known as: DECADRON ?  ?dexamethasone 4 MG tablet ?Commonly known as: DECADRON ?  ? ?  ? ?TAKE these medications   ? ?acetaminophen 500 MG tablet ?Commonly known as: TYLENOL ?Take 1 tablet (500 mg total) by mouth every 6 (six) hours as needed. ?What changed:  ?when to take this ?reasons to take this ?  ?apixaban 5 MG Tabs tablet ?Commonly known as: ELIQUIS ?Take 5 mg by mouth 2 (two) times daily. ?  ?docusate sodium 100 MG capsule ?Commonly known as: COLACE ?Take 1 capsule (100 mg total) by mouth 2 (two) times daily. ?  ?doxycycline 100 MG capsule ?Commonly known as: VIBRAMYCIN ?Take 100 mg by mouth  2 (two) times daily. ?  ?HYDROcodone-acetaminophen 5-325 MG tablet ?Commonly known as: NORCO/VICODIN ?Take 1 tablet by mouth every 6 (six) hours as needed for moderate pain. ?  ?hydroxypropyl methylcellulose / hypromellose 2.5 % ophthalmic solution ?Commonly known as: ISOPTO TEARS / GONIOVISC ?Place 1 drop into the left eye daily as needed for dry eyes. ?  ?lidocaine-prilocaine cream ?Commonly known as: EMLA ?Apply 1 application topically as needed. Apply small amount to port site at least 1 hour prior to it  being accessed, cover with plastic wrap ?  ?meloxicam 15 MG tablet ?Commonly known as: MOBIC ?Take 15 mg by mouth daily. ?  ?multivitamin tablet ?Take 1 tablet by mouth daily. ?  ?neomycin-bacitracin-polymyxin ointment ?Commonly known as: NEOSPORIN ?Apply topically daily. ?Start taking on: January 06, 2022 ?  ?ondansetron 4 MG tablet ?Commonly known as: ZOFRAN ?Take 1 tablet (4 mg total) by mouth every 6 (six) hours as needed for nausea. ?  ?pravastatin 80 MG tablet ?Commonly known as: PRAVACHOL ?Take 80 mg by mouth at bedtime. ?  ?saw palmetto 160 MG capsule ?Take 160 mg by mouth 2 (two) times daily. ?  ? ?  ? ?  ?  ? ? ?  ?Discharge Care Instructions  ?(From admission, onward)  ?  ? ? ?  ? ?  Start     Ordered  ? 01/05/22 0000  Discharge wound care:       ?Comments: Reinforce dressing  Until discontinued      ? 01/03/22 1459  ? 01/05/22 1148  ? ?  ?  ? ?  ? ? Follow-up Information   ? ? Lattie Corns, PA-C Follow up in 14 day(s).   ?Specialty: Physician Assistant ?Why: Staple Removal. ?Contact information: ?[address] [facility] ?[place] [address] ?[phone] ? ? ?  ?  ? ? Baxter Hire, MD. Schedule an appointment as soon as possible for a visit in 1 week(s).   ?Specialty: Internal Medicine ?Why: Hip surgery--hopistal f/u ?Contact information: ?[address] ?[place] [address] ?[phone] ? ? ?  ?  ? ?  ?  ? ?  ? ?Discharge Exam: ?Danley Danker Weights  ? 01/02/22 1836  ?Weight: 72.6 kg  ? ? ? ?Condition at discharge: fair ? ?The results of significant diagnostics from this hospitalization (including imaging, microbiology, ancillary and laboratory) are listed below for reference.  ? ?Imaging Studies: ?DG Chest 1 View ? ?Result Date: 01/02/2022 ?CLINICAL DATA:  History of lung cancer. Right hip fracture. No chest complaints. EXAM: CHEST  1 VIEW COMPARISON:  June 18, 2021 FINDINGS: Post radiation/therapeutic changes identified in the left perihilar region. Right Port-A-Cath  is stable. Mild scarring in the right mid to lower lung. The cardiomediastinal silhouette is unchanged. No pneumothorax. No other acute abnormalities. IMPRESSION: Chronic scarring in the left perihilar region consistent with post therapeutic change. Mild scar carotic atelectasis in the right mid to lower lung. No acute abnormalities. Electronically Signed   By: Dorise Bullion III M.D.   On: 01/02/2022 19:41  ? ?CT Head Wo Contrast ? ?Result Date: 01/02/2022 ?CLINICAL DATA:  Fall. On Eliquis. Metastatic lung cancer with known brain metastasis. EXAM: CT HEAD WITHOUT CONTRAST CT CERVICAL SPINE WITHOUT CONTRAST TECHNIQUE: Multidetector CT imaging of the head and cervical spine was performed following the standard protocol without intravenous contrast. Multiplanar CT image reconstructions of the cervical spine were also generated. RADIATION DOSE REDUCTION: This exam was performed according to the departmental dose-optimization program which includes automated exposure  control, adjustment of the mA and/or kV according to patient size and/or use of iterative reconstruction technique. COMPARISON:  MRI 12/16/2021 FINDINGS: CT HEAD FINDINGS Brain: Low-density mass in the parafalcine aspect of the high right frontal lobe with surrounding vasogenic edema. Mass measures approximately 1.6 x 1.4 cm (series 2, image 24). Appearance is grossly stable from recent MRI. No additional masses are identified. No evidence of acute infarction, hemorrhage, hydrocephalus, or extra-axial collection. Scattered low-density changes within the periventricular and subcortical white matter compatible with chronic microvascular ischemic change. Mild diffuse cerebral volume loss. Vascular: Atherosclerotic calcifications involving the large vessels of the skull base. No unexpected hyperdense vessel. Skull: Normal. Negative for fracture or focal lesion. Sinuses/Orbits: No acute finding. Other: Negative for scalp hematoma. CT CERVICAL SPINE FINDINGS  Alignment: Facet joints are aligned without dislocation or traumatic listhesis. Dens and lateral masses are aligned. Degenerative grade 1 anterolisthesis of C7 on T1. Skull base and vertebrae: No acute frac

## 2022-01-05 NOTE — Plan of Care (Signed)

## 2022-01-05 NOTE — TOC Progression Note (Signed)
Transition of Care (TOC) - Progression Note  ? ? ?Patient Details  ?Name: Jason Warren ?MRN: [id number] ?Date of Birth: 03/22/1929 ? ?Transition of Care (TOC) CM/SW Contact  ?Conception Oms, RN ?Phone Number: ?01/05/2022, 2:18 PM ? ?Clinical Narrative:   was brought to my attention the patient is waiting on a Rolling walker to go home, I went to verify since the patient's family and the patient had said that he has one at home earlier, They explained that they have a standard walker without wheels, I notified [employer] that the patient needs a RW ASAP that the DC was in place, [employer] will deliver to the room ? ? ? ?Expected Discharge Plan: [place] ?Barriers to Discharge: Barriers Resolved ? ?Expected Discharge Plan and Services ?Expected Discharge Plan: [place] ?  ?Discharge Planning Services: CM Consult ?  ?Living arrangements for the past 2 months: [place] ?Expected Discharge Date: 01/05/22               ?DME Arranged: N/A ?  ?  ?  ?  ?HH Arranged: PT, OT ?[place] Agency: [facility] ([place]) ?Date HH Agency Contacted: 01/05/22 ?Time [place]: 1155 ?Representative spoke with at [place]: Corene Cornea ? ? ?Social Determinants of Health (SDOH) Interventions ?  ? ?Readmission Risk Interventions ?   ? View : No data to display.  ?  ?  ?  ? ? ?

## 2022-01-14 ENCOUNTER — Inpatient Hospital Stay: Payer: Medicare Other | Attending: Internal Medicine

## 2022-01-14 ENCOUNTER — Encounter: Payer: Self-pay | Admitting: Internal Medicine

## 2022-01-14 ENCOUNTER — Inpatient Hospital Stay (HOSPITAL_BASED_OUTPATIENT_CLINIC_OR_DEPARTMENT_OTHER): Payer: Medicare Other | Admitting: Internal Medicine

## 2022-01-14 VITALS — BP 109/64 | HR 80 | Temp 96.9°F | Ht 72.0 in

## 2022-01-14 DIAGNOSIS — I4891 Unspecified atrial fibrillation: Secondary | ICD-10-CM | POA: Diagnosis not present

## 2022-01-14 DIAGNOSIS — C7931 Secondary malignant neoplasm of brain: Secondary | ICD-10-CM | POA: Insufficient documentation

## 2022-01-14 DIAGNOSIS — Z923 Personal history of irradiation: Secondary | ICD-10-CM | POA: Insufficient documentation

## 2022-01-14 DIAGNOSIS — C3432 Malignant neoplasm of lower lobe, left bronchus or lung: Secondary | ICD-10-CM | POA: Diagnosis not present

## 2022-01-14 DIAGNOSIS — C3492 Malignant neoplasm of unspecified part of left bronchus or lung: Secondary | ICD-10-CM

## 2022-01-14 DIAGNOSIS — Z7901 Long term (current) use of anticoagulants: Secondary | ICD-10-CM | POA: Insufficient documentation

## 2022-01-14 DIAGNOSIS — Z79899 Other long term (current) drug therapy: Secondary | ICD-10-CM | POA: Diagnosis not present

## 2022-01-14 LAB — COMPREHENSIVE METABOLIC PANEL
ALT: 22 U/L (ref 0–44)
AST: 28 U/L (ref 15–41)
Albumin: 2.6 g/dL — ABNORMAL LOW (ref 3.5–5.0)
Alkaline Phosphatase: 168 U/L — ABNORMAL HIGH (ref 38–126)
Anion gap: 8 (ref 5–15)
BUN: 26 mg/dL — ABNORMAL HIGH (ref 8–23)
CO2: 22 mmol/L (ref 22–32)
Calcium: 7.7 mg/dL — ABNORMAL LOW (ref 8.9–10.3)
Chloride: 99 mmol/L (ref 98–111)
Creatinine, Ser: 0.82 mg/dL (ref 0.61–1.24)
GFR, Estimated: >60 mL/min (ref >60)
Glucose, Bld: 133 mg/dL — ABNORMAL HIGH (ref 70–99)
Potassium: 4.1 mmol/L (ref 3.5–5.1)
Sodium: 129 mmol/L — ABNORMAL LOW (ref 135–145)
Total Bilirubin: 1.6 mg/dL — ABNORMAL HIGH (ref 0.3–1.2)
Total Protein: 5.5 g/dL — ABNORMAL LOW (ref 6.5–8.1)

## 2022-01-14 LAB — CBC WITH DIFFERENTIAL/PLATELET
Abs Immature Granulocytes: 0.1 10*3/uL — ABNORMAL HIGH (ref 0.00–0.07)
Basophils Absolute: 0 10*3/uL (ref 0.0–0.1)
Basophils Relative: 0 %
Eosinophils Absolute: 0.1 10*3/uL (ref 0.0–0.5)
Eosinophils Relative: 1 %
HCT: 30.4 % — ABNORMAL LOW (ref 39.0–52.0)
Hemoglobin: 10 g/dL — ABNORMAL LOW (ref 13.0–17.0)
Immature Granulocytes: 1 %
Lymphocytes Relative: 7 %
Lymphs Abs: 0.5 10*3/uL — ABNORMAL LOW (ref 0.7–4.0)
MCH: 32.3 pg (ref 26.0–34.0)
MCHC: 32.9 g/dL (ref 30.0–36.0)
MCV: 98.1 fL (ref 80.0–100.0)
Monocytes Absolute: 0.5 10*3/uL (ref 0.1–1.0)
Monocytes Relative: 6 %
Neutro Abs: 6.8 10*3/uL (ref 1.7–7.7)
Neutrophils Relative %: 85 %
Platelets: 464 10*3/uL — ABNORMAL HIGH (ref 150–400)
RBC: 3.1 MIL/uL — ABNORMAL LOW (ref 4.22–5.81)
RDW: 15.6 % — ABNORMAL HIGH (ref 11.5–15.5)
WBC: 8.1 10*3/uL (ref 4.0–10.5)
nRBC: 0 % (ref 0.0–0.2)

## 2022-01-14 MED ORDER — SODIUM CHLORIDE 0.9% FLUSH
10.0000 mL | Freq: Once | INTRAVENOUS | Status: AC
  Administered 2022-01-14: 10 mL via INTRAVENOUS
  Filled 2022-01-14: qty 10

## 2022-01-14 MED ORDER — HEPARIN SOD (PORK) LOCK FLUSH 100 UNIT/ML IV SOLN
500.0000 [IU] | Freq: Once | INTRAVENOUS | Status: AC
  Administered 2022-01-14: 500 [IU] via INTRAVENOUS
  Filled 2022-01-14: qty 5

## 2022-01-14 NOTE — Assessment & Plan Note (Addendum)
#  Stage IV- -non-small cell lung cancer-[favor adenocarcinoma] [at dx-stage III- s/p  concurrent chemoradiation; Finished Feb 2023]; s/p  adjuvant durvalumab [finished Feb 2023]; with metachronous brain metastases [March 2023]. ? ?#Patient CT scan February 2023 chest and pelvis-no evidence of any progressive disease.  We will consider reimaging in the next few months.  We will cancel the scan in 3 weeks; will multiple visits/scans. ? ?#Brain metastases-March 2023-MRI-brain 2.1 cm s/p SBRT [March 22nd, 2023]-we will repeat imaging in 3 weeks/approximately 2 months post radiation.   ?? ?# Mild intermittent elevation of of Alk phos-? From Imfinzi- monitor for now-stable ? ?# left-sided pleural effusion-CT November 2022. Hold off any thoracentesis-stable ? ?# A. fib on Eliquis [Dr.Kowalski]-  Mild swelling in legs- EF in Feb 2022- 55-60%; continue BIL compression stocking/leg elevation-stable ? ?#Right hip fracture status post ORIF.  Nonpathologic.  Improving s/p physical therapy. ? ?# MediPort flush q 6 W-stable. ? ?# DISPOSITION: ?# cancel the CT chest in May.  ?# MRI brain prior to next visit.  ?# Keep apts in May as planned-Dr.B ? ? ? ? ? ? ? ? ? ?

## 2022-01-14 NOTE — Progress Notes (Signed)
I connected with Jason Warren on 01/14/22  at 10:30 AM EDT by video enabled telemedicine visit and verified that I am speaking with the correct person using two identifiers.  ?I discussed the limitations, risks, security and privacy concerns of performing an evaluation and management service by telemedicine and the availability of in-person appointments. I also discussed with the patient that there may be a patient responsible charge related to this service. The patient expressed understanding and agreed to proceed.  ? ? ?Other persons participating in the visit and their role in the encounter: RN/medical reconciliation ?Patient?s location: home ?Provider?s location: office ? ?Oncology History Overview Note  ?# NOV 2021- LEFT LOWER LOBE NON-SMALL CELL CA [favor adeno ca]; T2N3 [right hilar; subcarinal LN;[name]; NOV 2021-MRI Brain-NEG ? ?# 11/30- carbo-Taxol-RT [RT until 10/23/20]; s/p IMFINZI on 2/24. [51 months]; finished February 2023. ? ?# #Brain metastases-March 2023-MRI-brain 2.1 cm peripherally enhancing lesion in the right paracentral lobule ?concerning for metastatic disease. There is mild perilesional edema and regional mass effect but no midline shift. ?On dexamethasone 2 mg a day.  Noted to have significant movement of the left lower extremity weakness.  Plan with SBRT on 3/22 ? ?# Right lower lobe - ~4.6 x 3.1 cm  [PET 07-2020]- demonstrates no significant hypermetabolic activity (SUV max 1.8]-benign. STABLE.  ? ? ? ?# A.fib [Eliquis; Dr.Kowalski] ? ?# # SURVIVORSHIP:  ? ?# GENETICS:  ? ?#NGS-ordered ? ?DIAGNOSIS: Lung cancer ? ?STAGE:   III      ;  GOALS:  cure ? ?CURRENT/MOST RECENT THERAPY : carbo-Taxol-RT ?] ?  ?Stage 4 lung cancer, left ([place])  ?08/14/2020 Initial Diagnosis  ? Cancer of lower lobe of left lung ([place]) ?  ?09/02/2020 - 10/22/2020 Chemotherapy  ?  Patient is on Treatment Plan: LUNG DURVALUMAB O84Z ? ?  ? ?  ?10/16/2020 Cancer Staging  ? Staging form: Lung, AJCC 8th Edition ?-  Clinical: Stage IIIB (cT2a, cN3, cM0) - Signed by [name], MD on 10/16/2020 ? ?  ?12/17/2020 -  Chemotherapy  ? Patient is on Treatment Plan : LUNG Durvalumab q14d  ? ?  ?  ? ? ?Chief Complaint: Lung cancer ? ?History of present illness:Jason Warren 88 y.o.  male with history of stage IV lung cancer with solitary brain metastasis status post SBRT is here for follow-up. ? ?In the interim unfortunately patient had a fall fracturing his right hip-s/p ORIF.  Patient is currently undergoing physical therapy.  He is currently doing well at home.  No further falls.  ? ?His appetite is improving.  No nausea no vomiting.  No headaches. ? ?Observation/objective: Alert & oriented x 3. In No acute distress.  ? ?Assessment and plan: ?Stage 4 lung cancer, left ([place]) ?#Stage IV- -non-small cell lung cancer-[favor adenocarcinoma] [at dx-stage III- s/p  concurrent chemoradiation; Finished Feb 2023]; s/p  adjuvant durvalumab [finished Feb 2023]; with metachronous brain metastases [March 2023]. ? ?#Patient CT scan February 2023 chest and pelvis-no evidence of any progressive disease.  We will consider reimaging in the next few months.  We will cancel the scan in 3 weeks; will multiple visits/scans. ? ?#Brain metastases-March 2023-MRI-brain 2.1 cm s/p SBRT [March 22nd, 2023]-we will repeat imaging in 3 weeks/approximately 2 months post radiation.   ?  ?# Mild intermittent elevation of of Alk phos-? From Imfinzi- monitor for now-stable ? ?# left-sided pleural effusion-CT November 2022. Hold off any thoracentesis-stable ? ?# A. fib on Eliquis [Dr.Kowalski]-  Mild swelling in legs- EF in Feb 2022-  55-60%; continue BIL compression stocking/leg elevation-stable ? ?#Right hip fracture status post ORIF.  Nonpathologic.  Improving s/p physical therapy. ? ?# MediPort flush q 6 W-stable. ? ?# DISPOSITION: ?# cancel the CT chest in May.  ?# MRI brain prior to next visit.  ?# Keep apts in May as  planned-Dr.B ? ? ? ? ? ? ? ? ? ?Follow-up instructions: ? ?I discussed the assessment and treatment plan with the patient.  The patient was provided an opportunity to ask questions and all were answered.  The patient agreed with the plan and demonstrated understanding of instructions. ? ?The patient was advised to call back or seek an in person evaluation if the symptoms worsen or if the condition fails to improve as anticipated. ? ?Dr. Charlaine Dalton ?[facility] ?01/18/2022 ?12:32 PM ?

## 2022-01-18 ENCOUNTER — Encounter: Payer: Self-pay | Admitting: Internal Medicine

## 2022-01-27 ENCOUNTER — Telehealth: Payer: Self-pay | Admitting: *Deleted

## 2022-01-27 NOTE — Telephone Encounter (Signed)
CAll placed to patient for more information, but I had to leave message on voice mail to return my call ?

## 2022-01-27 NOTE — Telephone Encounter (Signed)
I also left a message on Jan and patient's voicemail asking for more information of urinary symptoms. ?

## 2022-01-27 NOTE — Telephone Encounter (Addendum)
Daughter who is not listed to speak to and I do not find an HIPPA Release form in chart called stating patient is up all night voiding and she  is asking what can be done for it ?

## 2022-01-28 ENCOUNTER — Other Ambulatory Visit: Payer: Self-pay | Admitting: *Deleted

## 2022-01-28 ENCOUNTER — Ambulatory Visit
Admission: RE | Admit: 2022-01-28 | Discharge: 2022-01-28 | Disposition: A | Discharge status: Home / Self Care | Payer: Medicare Other | Source: Ambulatory Visit | Attending: Radiation Oncology | Admitting: Radiation Oncology

## 2022-01-28 ENCOUNTER — Encounter: Payer: Self-pay | Admitting: Radiation Oncology

## 2022-01-28 VITALS — BP 122/62 | HR 74 | Resp 16

## 2022-01-28 DIAGNOSIS — Z923 Personal history of irradiation: Secondary | ICD-10-CM | POA: Diagnosis not present

## 2022-01-28 DIAGNOSIS — C3432 Malignant neoplasm of lower lobe, left bronchus or lung: Secondary | ICD-10-CM | POA: Diagnosis not present

## 2022-01-28 DIAGNOSIS — C7931 Secondary malignant neoplasm of brain: Secondary | ICD-10-CM | POA: Insufficient documentation

## 2022-01-28 MED ORDER — TAMSULOSIN HCL 0.4 MG PO CAPS: 0.4000 mg | ORAL_CAPSULE | Freq: Every day | ORAL | 6 refills | Status: DC

## 2022-01-28 NOTE — Telephone Encounter (Addendum)
Patient in office for follow up with Dr. Baruch Gouty today.   Family stated they called triage yesterday but missed the return call.  Mr. Jason Warren has been having frequent urination particularly at night.  He is getting up every hour and is unable to rest.  He denies any other symptoms such as burning, urgency etc.  ? ?Addendum:  Dr. Baruch Gouty ordered Flomax for patient to try ?

## 2022-01-28 NOTE — Progress Notes (Signed)
Radiation Oncology ?Follow up Note ? ?Name: Jason Warren   ?Date:   01/28/2022 ?MRN:  [id number] ?DOB: 10/19/1928  ? ? ?This 88 y.o. male presents to the clinic today for 1 month follow-up status post SRS for solitary brain metastasis and patient treated for stage IIIb adenocarcinoma of the lung. ? ?REFERRING PROVIDER: Baxter Hire, MD ? ?HPI: Patient is a 88 year old male now out 1 month having completed SRS for solitary brain metastasis and patient with known stage IIIb adenocarcinoma of the lung.  Seen today in routine follow-up he is doing fairly well still has some decreased strength in his lower extremity.  He also has a fracture of his right femur.  This was secondary to fall at home and he had an intertrochanteric right hip fracture with avulsion of the lesser trochanter.  He underwent ORIF on 01/03/2022.  Still having some pain in that right hip although he is working with physical therapy.  He specifically denies any further neurologic deficits.  He is having frequency of urination at night which is causing him restlessness. ? ?COMPLICATIONS OF TREATMENT: none ? ?FOLLOW UP COMPLIANCE: keeps appointments  ? ?PHYSICAL EXAM:  ?BP 122/62 (BP Location: Left Arm, Patient Position: Sitting)   Pulse 74   Resp 16  ?Jason Warren male wheelchair-bound in NAD.  Strength in his lower extremities appears diminished although equal and symmetric.  Crude visual fields are within normal range.  Well-developed well-nourished patient in NAD. HEENT reveals PERLA, EOMI, discs not visualized.  Oral cavity is clear. No oral mucosal lesions are identified. Neck is clear without evidence of cervical or supraclavicular adenopathy. Lungs are clear to A&P. Cardiac examination is essentially unremarkable with regular rate and rhythm without murmur rub or thrill. Abdomen is benign with no organomegaly or masses noted. Motor sensory and DTR levels are equal and symmetric in the upper and lower extremities. Cranial nerves II through  XII are grossly intact. Proprioception is intact. No peripheral adenopathy or edema is identified. No motor or sensory levels are noted. Crude visual fields are within normal range. ? ?RADIOLOGY RESULTS: No current films for review he has an MRI scan of his brain scheduled for next month ? ?PLAN: This time we will continue to observe the patient.  I will review his MRI scan when it becomes available.  Have asked to see him back in 3 months for follow-up.  He continues close follow-up care with medical oncology.  Family and patient know to call with any concerns. ? ?I would like to take this opportunity to thank you for allowing me to participate in the care of your patient.. ?  ? Jason Filbert, MD ? ?

## 2022-01-29 ENCOUNTER — Encounter: Payer: Self-pay | Admitting: Internal Medicine

## 2022-02-15 ENCOUNTER — Telehealth: Payer: Self-pay | Admitting: *Deleted

## 2022-02-15 ENCOUNTER — Other Ambulatory Visit: Payer: Self-pay | Admitting: *Deleted

## 2022-02-15 DIAGNOSIS — R350 Frequency of micturition: Secondary | ICD-10-CM

## 2022-02-15 NOTE — Telephone Encounter (Signed)
Patient wife called stating that patient is continuing to have urinary frequency at night and that the Flomax is not helping much. She is asking if there is anything else that can be done for it. Please advise  ?

## 2022-02-16 ENCOUNTER — Encounter: Payer: Self-pay | Admitting: Internal Medicine

## 2022-02-16 NOTE — Telephone Encounter (Signed)
I called and spoke with Jan and appointment has been accepted for tomorrow at 945. Patient has Ortho appointment tomorrow at 815 and usually does not last long so he will come over after that. Lab orders entered per verbal order J Borders, NP for CBCD, CMP, UA, Urine Culture ?

## 2022-02-17 ENCOUNTER — Inpatient Hospital Stay: Payer: Medicare Other | Attending: Internal Medicine

## 2022-02-17 ENCOUNTER — Inpatient Hospital Stay (HOSPITAL_BASED_OUTPATIENT_CLINIC_OR_DEPARTMENT_OTHER): Payer: Medicare Other | Admitting: Hospice and Palliative Medicine

## 2022-02-17 VITALS — BP 118/70 | HR 62 | Temp 97.7°F | Resp 17

## 2022-02-17 DIAGNOSIS — C3432 Malignant neoplasm of lower lobe, left bronchus or lung: Secondary | ICD-10-CM | POA: Diagnosis present

## 2022-02-17 DIAGNOSIS — I4891 Unspecified atrial fibrillation: Secondary | ICD-10-CM | POA: Insufficient documentation

## 2022-02-17 DIAGNOSIS — Z95828 Presence of other vascular implants and grafts: Secondary | ICD-10-CM

## 2022-02-17 DIAGNOSIS — Z923 Personal history of irradiation: Secondary | ICD-10-CM | POA: Insufficient documentation

## 2022-02-17 DIAGNOSIS — Z79899 Other long term (current) drug therapy: Secondary | ICD-10-CM | POA: Diagnosis not present

## 2022-02-17 DIAGNOSIS — C7931 Secondary malignant neoplasm of brain: Secondary | ICD-10-CM | POA: Insufficient documentation

## 2022-02-17 DIAGNOSIS — Z7901 Long term (current) use of anticoagulants: Secondary | ICD-10-CM | POA: Insufficient documentation

## 2022-02-17 DIAGNOSIS — R351 Nocturia: Secondary | ICD-10-CM | POA: Diagnosis not present

## 2022-02-17 DIAGNOSIS — C3492 Malignant neoplasm of unspecified part of left bronchus or lung: Secondary | ICD-10-CM

## 2022-02-17 DIAGNOSIS — R350 Frequency of micturition: Secondary | ICD-10-CM | POA: Diagnosis not present

## 2022-02-17 LAB — CBC WITH DIFFERENTIAL/PLATELET
Abs Immature Granulocytes: 0.03 10*3/uL (ref 0.00–0.07)
Basophils Absolute: 0 10*3/uL (ref 0.0–0.1)
Basophils Relative: 0 %
Eosinophils Absolute: 0.1 10*3/uL (ref 0.0–0.5)
Eosinophils Relative: 1 %
HCT: 39.8 % (ref 39.0–52.0)
Hemoglobin: 13 g/dL (ref 13.0–17.0)
Immature Granulocytes: 1 %
Lymphocytes Relative: 22 %
Lymphs Abs: 1.3 10*3/uL (ref 0.7–4.0)
MCH: 31.7 pg (ref 26.0–34.0)
MCHC: 32.7 g/dL (ref 30.0–36.0)
MCV: 97.1 fL (ref 80.0–100.0)
Monocytes Absolute: 0.5 10*3/uL (ref 0.1–1.0)
Monocytes Relative: 9 %
Neutro Abs: 4.2 10*3/uL (ref 1.7–7.7)
Neutrophils Relative %: 67 %
Platelets: 316 10*3/uL (ref 150–400)
RBC: 4.1 MIL/uL — ABNORMAL LOW (ref 4.22–5.81)
RDW: 14.9 % (ref 11.5–15.5)
WBC: 6.2 10*3/uL (ref 4.0–10.5)
nRBC: 0 % (ref 0.0–0.2)

## 2022-02-17 LAB — URINALYSIS, COMPLETE (UACMP) WITH MICROSCOPIC
Bacteria, UA: NONE SEEN
Bilirubin Urine: NEGATIVE
Glucose, UA: NEGATIVE mg/dL
Hgb urine dipstick: NEGATIVE
Ketones, ur: NEGATIVE mg/dL
Leukocytes,Ua: NEGATIVE
Nitrite: NEGATIVE
Protein, ur: NEGATIVE mg/dL
Specific Gravity, Urine: 1.016 (ref 1.005–1.030)
Squamous Epithelial / HPF: NONE SEEN (ref 0–5)
pH: 5 (ref 5.0–8.0)

## 2022-02-17 LAB — COMPREHENSIVE METABOLIC PANEL
ALT: 15 U/L (ref 0–44)
AST: 23 U/L (ref 15–41)
Albumin: 3.7 g/dL (ref 3.5–5.0)
Alkaline Phosphatase: 156 U/L — ABNORMAL HIGH (ref 38–126)
Anion gap: 6 (ref 5–15)
BUN: 18 mg/dL (ref 8–23)
CO2: 25 mmol/L (ref 22–32)
Calcium: 8.4 mg/dL — ABNORMAL LOW (ref 8.9–10.3)
Chloride: 100 mmol/L (ref 98–111)
Creatinine, Ser: 0.8 mg/dL (ref 0.61–1.24)
GFR, Estimated: >60 mL/min (ref >60)
Glucose, Bld: 93 mg/dL (ref 70–99)
Potassium: 3.8 mmol/L (ref 3.5–5.1)
Sodium: 131 mmol/L — ABNORMAL LOW (ref 135–145)
Total Bilirubin: 1 mg/dL (ref 0.3–1.2)
Total Protein: 6.9 g/dL (ref 6.5–8.1)

## 2022-02-17 MED ORDER — HEPARIN SOD (PORK) LOCK FLUSH 100 UNIT/ML IV SOLN
500.0000 [IU] | Freq: Once | INTRAVENOUS | Status: AC
  Administered 2022-02-17: 500 [IU] via INTRAVENOUS
  Filled 2022-02-17: qty 5

## 2022-02-17 MED ORDER — SODIUM CHLORIDE 0.9% FLUSH
10.0000 mL | Freq: Once | INTRAVENOUS | Status: AC
  Administered 2022-02-17: 10 mL via INTRAVENOUS
  Filled 2022-02-17: qty 10

## 2022-02-17 NOTE — Progress Notes (Signed)
Pt presents to [facility] with concern of urinary frequency. Per daughter, pt was started on flomax in April, but does not feel that it is helping. She reports that patient is urinating every 1-1.5 hours, and he is not getting much sleep because of this. Denies fever, chills, or dysuria.  ?

## 2022-02-17 NOTE — Progress Notes (Signed)
? ?[facility] ?[place] [facility] ?Telephone:([phone]) [phone] Fax:(336) [phone] ? ?Patient Care Team: ?Baxter Hire, MD as PCP - General (Internal Medicine) ?[name], RN as Sales executive ?[name], MD as Consulting Physician (Hematology and Oncology)  ? ?Name of the patient: Jason Warren  ?[id number]  ?1929/08/15  ? ?Date of visit: 02/17/22 ? ?Reason for Consult: ?Yadier Bramhall is a 88 y.o. male with multiple medical problems including stage IV non-small cell lung cancer originally diagnosed November 2021 status post CarboTaxol RT.  Status post Imfinzi for 12 months.  Brain metastasis in March 2023 status post SBRT. ? ?Patient presents to [facility] today for evaluation of nighttime urinary frequency. ? ?Patient endorses months of urinary frequency at night more than several episodes each night.  He only has small amount of voiding each attempt.  He denies dysuria or pain.  Patient sleeps in a recliner as he has difficulty getting in and out of bed.  He generally uses a urinal at night.  He does endorse drinking more fluids in the evening hours with dinner about 7 PM each night.  Patient was started on Flomax 0.4 mg daily by radiation oncology the patient has not found this to be particularly helpful. ? ?Denies any neurologic complaints. Denies recent fevers or illnesses. Denies any easy bleeding or bruising. Reports good appetite and denies weight loss. Denies chest pain. Denies any nausea, vomiting, constipation, or diarrhea. Patient offers no further specific complaints today. ? ?PAST MEDICAL HISTORY: ?Past Medical History:  ?Diagnosis Date  ? Arthritis   ? Dysrhythmia   ? A-fib  ? Hip pain   ? left  ? HOH (hard of hearing)   ? LBBB (left bundle branch block) 08/05/2020  ? Lung cancer ([place])   ? Skin cancer, basal cell   ?  face top of head  ? ? ?PAST SURGICAL HISTORY:  ?Past Surgical History:  ?Procedure Laterality Date  ? CATARACT EXTRACTION  W/PHACO Right 07/13/2017  ? Procedure: CATARACT EXTRACTION PHACO AND INTRAOCULAR LENS PLACEMENT (I[facility]);  Surgeon: Leandrew Koyanagi, MD;  Location: [place];  Service: Ophthalmology;  Laterality: Right;  IVA TOPICAL ?RIGHT  ? CATARACT EXTRACTION W/PHACO Left 01/04/2018  ? Procedure: CATARACT EXTRACTION PHACO AND INTRAOCULAR LENS PLACEMENT ([place]) LEFT;  Surgeon: Leandrew Koyanagi, MD;  Location: [place];  Service: Ophthalmology;  Laterality: Left;  ? COLONOSCOPY    ? INTRAMEDULLARY (IM) NAIL INTERTROCHANTERIC Right 01/03/2022  ? Procedure: INTRAMEDULLARY (IM) NAIL INTERTROCHANTRIC;  Surgeon: Corky Mull, MD;  Location: [facility];  Service: Orthopedics;  Laterality: Right;  ? IR IMAGING GUIDED PORT INSERTION  08/27/2020  ? JOINT REPLACEMENT    ? Left total hip Dr. Su Hoff 08-04-18  ? ROTATOR CUFF REPAIR Right   ? SKIN CANCER EXCISION    ? face  ? TOTAL HIP ARTHROPLASTY Left 08/04/2018  ? Procedure: TOTAL HIP ARTHROPLASTY ANTERIOR APPROACH;  Surgeon: Frederik Pear, MD;  [facility];  Service: Orthopedics;  Laterality: Left;  ? VIDEO BRONCHOSCOPY WITH ENDOBRONCHIAL NAVIGATION N/A 08/07/2020  ? Procedure: VIDEO BRONCHOSCOPY WITH ENDOBRONCHIAL NAVIGATION;  Surgeon: Ottie Glazier, MD;  Location: [facility];  Service: Thoracic;  Laterality: N/A;  ? VIDEO BRONCHOSCOPY WITH ENDOBRONCHIAL ULTRASOUND N/A 08/07/2020  ? Procedure: VIDEO BRONCHOSCOPY WITH ENDOBRONCHIAL ULTRASOUND;  Surgeon: Ottie Glazier, MD;  Location: [facility];  Service: Thoracic;  Laterality: N/A;  ? ? ?HEMATOLOGY/ONCOLOGY HISTORY:  ?Oncology History Overview Note  ?# NOV 2021- LEFT LOWER LOBE NON-SMALL CELL CA [  favor adeno ca]; T2N3 [right hilar; subcarinal LN;[name]; NOV 2021-MRI Brain-NEG ? ?# 11/30- carbo-Taxol-RT [RT until 10/23/20]; s/p IMFINZI on 2/24. [82 months]; finished February 2023. ? ?# #Brain metastases-March 2023-MRI-brain 2.1 cm peripherally enhancing lesion in the right paracentral lobule ?concerning for  metastatic disease. There is mild perilesional edema and regional mass effect but no midline shift. ?On dexamethasone 2 mg a day.  Noted to have significant movement of the left lower extremity weakness.  Plan with SBRT on 3/22 ? ?# Right lower lobe - ~4.6 x 3.1 cm  [PET 07-2020]- demonstrates no significant hypermetabolic activity (SUV max 1.8]-benign. STABLE.  ? ? ? ?# A.fib [Eliquis; Dr.Kowalski] ? ?# # SURVIVORSHIP:  ? ?# GENETICS:  ? ?#NGS-ordered ? ?DIAGNOSIS: Lung cancer ? ?STAGE:   III      ;  GOALS:  cure ? ?CURRENT/MOST RECENT THERAPY : carbo-Taxol-RT ?] ?  ?Stage 4 lung cancer, left ([place])  ?08/14/2020 Initial Diagnosis  ? Cancer of lower lobe of left lung ([place]) ?  ?09/02/2020 - 10/22/2020 Chemotherapy  ?  Patient is on Treatment Plan: LUNG DURVALUMAB U23N ? ?  ?  ?10/16/2020 Cancer Staging  ? Staging form: Lung, AJCC 8th Edition ?- Clinical: Stage IIIB (cT2a, cN3, cM0) - Signed by [name], MD on 10/16/2020 ?  ?12/17/2020 -  Chemotherapy  ? Patient is on Treatment Plan : LUNG Durvalumab q14d  ?   ? ? ?ALLERGIES:  has No Known Allergies. ? ?MEDICATIONS:  ?Current Outpatient Medications  ?Medication Sig Dispense Refill  ? acetaminophen (TYLENOL) 500 MG tablet Take 1 tablet (500 mg total) by mouth every 6 (six) hours as needed. 30 tablet 0  ? apixaban (ELIQUIS) 5 MG TABS tablet Take 5 mg by mouth 2 (two) times daily.    ? docusate sodium (COLACE) 100 MG capsule Take 1 capsule (100 mg total) by mouth 2 (two) times daily. 10 capsule 0  ? doxycycline (VIBRAMYCIN) 100 MG capsule Take 100 mg by mouth 2 (two) times daily. (Patient not taking: Reported on 01/28/2022)    ? HYDROcodone-acetaminophen (NORCO/VICODIN) 5-325 MG tablet Take 1 tablet by mouth every 6 (six) hours as needed for moderate pain. (Patient not taking: Reported on 01/28/2022) 30 tablet 0  ? hydroxypropyl methylcellulose / hypromellose (ISOPTO TEARS / GONIOVISC) 2.5 % ophthalmic solution Place 1 drop into the left eye daily as needed for dry  eyes.    ? lidocaine-prilocaine (EMLA) cream Apply 1 application topically as needed. Apply small amount to port site at least 1 hour prior to it being accessed, cover with plastic wrap 30 g 1  ? meloxicam (MOBIC) 15 MG tablet Take 15 mg by mouth daily.    ? Multiple Vitamin (MULTIVITAMIN) tablet Take 1 tablet by mouth daily.    ? neomycin-bacitracin-polymyxin (NEOSPORIN) ointment Apply topically daily. 15 g 0  ? ondansetron (ZOFRAN) 4 MG tablet Take 1 tablet (4 mg total) by mouth every 6 (six) hours as needed for nausea. 30 tablet 0  ? pravastatin (PRAVACHOL) 80 MG tablet Take 80 mg by mouth at bedtime.     ? saw palmetto 160 MG capsule Take 160 mg by mouth 2 (two) times daily.    ? tamsulosin (FLOMAX) 0.4 MG CAPS capsule Take 1 capsule (0.4 mg total) by mouth daily after supper. 30 capsule 6  ? ?No current facility-administered medications for this visit.  ? ?Facility-Administered Medications Ordered in Other Visits  ?Medication Dose Route Frequency Provider Last Rate Last Admin  ? heparin lock flush  100 UNIT/ML injection           ? sodium chloride flush (NS) 0.9 % injection 10 mL  10 mL Intravenous PRN [name], MD   10 mL at 02/11/21 0835  ? ? ?VITAL SIGNS: ?BP 118/70   Pulse 62   Temp 97.7 ?F (36.5 ?C) (Tympanic)   Resp 17   SpO2 100%  ?There were no vitals filed for this visit.  ?Estimated body mass index is 21.7 kg/m? as calculated from the following: ?  Height as of 01/14/22: 6' (1.829 m). ?  Weight as of 01/02/22: 160 lb (72.6 kg). ? ?LABS: ?CBC: ?   ?Component Value Date/Time  ? WBC 8.1 01/14/2022 1003  ? HGB 10.0 (L) 01/14/2022 1003  ? HCT 30.4 (L) 01/14/2022 1003  ? PLT 464 (H) 01/14/2022 1003  ? MCV 98.1 01/14/2022 1003  ? NEUTROABS 6.8 01/14/2022 1003  ? LYMPHSABS 0.5 (L) 01/14/2022 1003  ? MONOABS 0.5 01/14/2022 1003  ? EOSABS 0.1 01/14/2022 1003  ? BASOSABS 0.0 01/14/2022 1003  ? ?Comprehensive Metabolic Panel: ?   ?Component Value Date/Time  ? NA 129 (L) 01/14/2022 1003  ? K 4.1  01/14/2022 1003  ? CL 99 01/14/2022 1003  ? CO2 22 01/14/2022 1003  ? BUN 26 (H) 01/14/2022 1003  ? CREATININE 0.82 01/14/2022 1003  ? GLUCOSE 133 (H) 01/14/2022 1003  ? CALCIUM 7.7 (L) 01/14/2022 1003  ? AST 28 0

## 2022-02-18 LAB — URINE CULTURE: Culture: NO GROWTH

## 2022-02-22 ENCOUNTER — Ambulatory Visit
Admission: RE | Admit: 2022-02-22 | Discharge: 2022-02-22 | Disposition: A | Discharge status: Home / Self Care | Payer: Medicare Other | Source: Ambulatory Visit | Attending: Internal Medicine | Admitting: Internal Medicine

## 2022-02-22 ENCOUNTER — Ambulatory Visit: Payer: Medicare Other

## 2022-02-22 DIAGNOSIS — C7931 Secondary malignant neoplasm of brain: Secondary | ICD-10-CM | POA: Diagnosis present

## 2022-02-22 DIAGNOSIS — C3492 Malignant neoplasm of unspecified part of left bronchus or lung: Secondary | ICD-10-CM | POA: Insufficient documentation

## 2022-02-22 MED ORDER — GADOBUTROL 1 MMOL/ML IV SOLN
7.0000 mL | Freq: Once | INTRAVENOUS | Status: AC | PRN
  Administered 2022-02-22: 7 mL via INTRAVENOUS

## 2022-02-26 ENCOUNTER — Inpatient Hospital Stay: Payer: Medicare Other

## 2022-02-26 ENCOUNTER — Inpatient Hospital Stay (HOSPITAL_BASED_OUTPATIENT_CLINIC_OR_DEPARTMENT_OTHER): Payer: Medicare Other | Admitting: Internal Medicine

## 2022-02-26 ENCOUNTER — Encounter: Payer: Self-pay | Admitting: Internal Medicine

## 2022-02-26 DIAGNOSIS — C3492 Malignant neoplasm of unspecified part of left bronchus or lung: Secondary | ICD-10-CM

## 2022-02-26 DIAGNOSIS — Z95828 Presence of other vascular implants and grafts: Secondary | ICD-10-CM

## 2022-02-26 DIAGNOSIS — C3432 Malignant neoplasm of lower lobe, left bronchus or lung: Secondary | ICD-10-CM | POA: Diagnosis not present

## 2022-02-26 DIAGNOSIS — R5383 Other fatigue: Secondary | ICD-10-CM

## 2022-02-26 LAB — COMPREHENSIVE METABOLIC PANEL
ALT: 21 U/L (ref 0–44)
AST: 27 U/L (ref 15–41)
Albumin: 3.7 g/dL (ref 3.5–5.0)
Alkaline Phosphatase: 130 U/L — ABNORMAL HIGH (ref 38–126)
Anion gap: 6 (ref 5–15)
BUN: 22 mg/dL (ref 8–23)
CO2: 28 mmol/L (ref 22–32)
Calcium: 8.5 mg/dL — ABNORMAL LOW (ref 8.9–10.3)
Chloride: 103 mmol/L (ref 98–111)
Creatinine, Ser: 0.94 mg/dL (ref 0.61–1.24)
GFR, Estimated: >60 mL/min (ref >60)
Glucose, Bld: 99 mg/dL (ref 70–99)
Potassium: 3.8 mmol/L (ref 3.5–5.1)
Sodium: 137 mmol/L (ref 135–145)
Total Bilirubin: 0.5 mg/dL (ref 0.3–1.2)
Total Protein: 6.5 g/dL (ref 6.5–8.1)

## 2022-02-26 LAB — CBC WITH DIFFERENTIAL/PLATELET
Abs Immature Granulocytes: 0.09 10*3/uL — ABNORMAL HIGH (ref 0.00–0.07)
Basophils Absolute: 0 10*3/uL (ref 0.0–0.1)
Basophils Relative: 0 %
Eosinophils Absolute: 0 10*3/uL (ref 0.0–0.5)
Eosinophils Relative: 0 %
HCT: 36.9 % — ABNORMAL LOW (ref 39.0–52.0)
Hemoglobin: 12.2 g/dL — ABNORMAL LOW (ref 13.0–17.0)
Immature Granulocytes: 1 %
Lymphocytes Relative: 18 %
Lymphs Abs: 1.4 10*3/uL (ref 0.7–4.0)
MCH: 32.4 pg (ref 26.0–34.0)
MCHC: 33.1 g/dL (ref 30.0–36.0)
MCV: 97.9 fL (ref 80.0–100.0)
Monocytes Absolute: 0.7 10*3/uL (ref 0.1–1.0)
Monocytes Relative: 9 %
Neutro Abs: 5.7 10*3/uL (ref 1.7–7.7)
Neutrophils Relative %: 72 %
Platelets: 283 10*3/uL (ref 150–400)
RBC: 3.77 MIL/uL — ABNORMAL LOW (ref 4.22–5.81)
RDW: 14.2 % (ref 11.5–15.5)
WBC: 8 10*3/uL (ref 4.0–10.5)
nRBC: 0 % (ref 0.0–0.2)

## 2022-02-26 LAB — TSH: TSH: 5.653 u[IU]/mL — ABNORMAL HIGH (ref 0.350–4.500)

## 2022-02-26 MED ORDER — SODIUM CHLORIDE 0.9% FLUSH
10.0000 mL | Freq: Once | INTRAVENOUS | Status: AC
  Administered 2022-02-26: 10 mL via INTRAVENOUS
  Filled 2022-02-26: qty 10

## 2022-02-26 MED ORDER — DEXAMETHASONE 4 MG PO TABS: ORAL_TABLET | ORAL | 0 refills | Status: DC

## 2022-02-26 MED ORDER — HEPARIN SOD (PORK) LOCK FLUSH 100 UNIT/ML IV SOLN
500.0000 [IU] | Freq: Once | INTRAVENOUS | Status: AC
  Administered 2022-02-26: 500 [IU] via INTRAVENOUS
  Filled 2022-02-26: qty 5

## 2022-02-26 NOTE — Assessment & Plan Note (Signed)
#  Stage IV- -non-small cell lung cancer-[favor adenocarcinoma] [at dx-stage III- s/p  concurrent chemoradiation; s/p  adjuvant durvalumab; finished Feb 2023]; with metachronous brain metastases [March 2023];  Patient CT scan February 2023 chest and pelvis-no evidence of any progressive disease.    #Clinically stable-continue monitoring  With Repeat imaging in 2 months; will order today.   # Brain metastases-March 2023-MRI-brain 2.1 cm s/p SBRT [March, 23rrd-2023]  MAY 2023-MRI: Small decrease in size of treated right frontal metastasis ;Increased surrounding edema is likely related to treatment. No new metastasis.  Given the worsening focal weakness left lower extremity-proceed with dexamethasone for the next 3 weeks.  Discussed with Dr. Donella Stade.  # Mild intermittent elevation of of Alk phos-? From Imfinzi- monitor for now-stable  # left-sided pleural effusion-CT November 2022. Hold off any thoracentesis-stable  # A. fib on Eliquis [Dr.Kowalski]-  Mild swelling in legs- EF in Feb 2022- 55-60%; continue BIL compression stocking/leg elevation-stable  #Right hip fracture status post ORIF.  Nonpathologic.  Improving s/p physical therapy.  #Difficulty with urination: Currently on Flomax;  currently awaiting evaluation with urology  # MediPort flush q 6 W-stable.  #  DISPOSITION: # follow up in 3 weeks- MD; labs- cbc/cmp;port flush--Dr.B   # I reviewed the blood work- with the patient in detail; also reviewed the imaging independently [as summarized above]; and with the patient in detail.

## 2022-02-26 NOTE — Progress Notes (Unsigned)
Pt and wife in for follow up, daughter in waiting room.  Pt middle right finger still swollen, finished antibiotic and finishes prednisone tomorrow.

## 2022-02-26 NOTE — Progress Notes (Unsigned)
[place] NOTE  Patient Care Team: Baxter Hire, MD as PCP - General (Internal Medicine) [name], RN as Oncology Nurse Navigator [name], MD as Consulting Physician (Hematology and Oncology)  CHIEF COMPLAINTS/PURPOSE OF CONSULTATION: Lung cancer  #  Oncology History Overview Note  # NOV 2021- LEFT LOWER LOBE NON-SMALL CELL CA [favor adeno ca]; T2N3 [right hilar; subcarinal LN;[name]; NOV 2021-MRI [place]  # 11/30- carbo-Taxol-RT [RT until 10/23/20]; s/p IMFINZI on 2/24. [53 months]; finished February 2023.  # #Brain metastases-March 2023-MRI-brain 2.1 cm peripherally enhancing lesion in the right paracentral lobule concerning for metastatic disease. There is mild perilesional edema and regional mass effect but no midline shift. On dexamethasone 2 mg a day.  Noted to have significant movement of the left lower extremity weakness.  Plan with SBRT on 3/22  # Right lower lobe - ~4.6 x 3.1 cm  [PET 07-2020]- demonstrates no significant hypermetabolic activity (SUV max 1.8]-benign. STABLE.     # A.fib [Eliquis; Dr.Kowalski]  # # SURVIVORSHIP:   # GENETICS:   #NGS-ordered  DIAGNOSIS: Lung cancer  STAGE:   III      ;  GOALS:  cure  CURRENT/MOST RECENT THERAPY : carbo-Taxol-RT ]   Stage 4 lung cancer, left ([place])  08/14/2020 Initial Diagnosis   Cancer of lower lobe of left lung ([place])   09/02/2020 - 10/22/2020 Chemotherapy    Patient is on Treatment Plan: LUNG DURVALUMAB Q14D       10/16/2020 Cancer Staging   Staging form: Lung, AJCC 8th Edition - Clinical: Stage IIIB (cT2a, cN3, cM0) - Signed by [name], MD on 10/16/2020    12/17/2020 -  Chemotherapy   Patient is on Treatment Plan : LUNG Durvalumab q14d       HISTORY OF PRESENTING ILLNESS: Patient in wheelchair.  He is accompanied by his wife/daughter.  [name] 88 y.o.  male patient with currently stage IV [metachronus stage III lung non-small  cell lung; s/p treatment feb 2023 with Brain mets status post [facility] MARCH 2023 is here to review the MRI.  Patient is currently status post SBRT for his brain metastasis.  Patient noted to have slightly worsening of his left lower extremity weakness in the last few weeks.  He is currently on prednisone 10 mg a day.  Pt middle right finger still swollen, finished antibiotic and finishes prednisone tomorrow.   In the interim patient was also evaluated by symptom management clinic-for increased frequency of urination.  On Flomax.  Patient denies any shortness of breath.  Intermittent cough not any worse.  Intermittent difficulty swallowing-not any worse.  Denies any swelling in the legs.  Continues to wear compression stockings.    Review of Systems  Constitutional:  Negative for chills, diaphoresis, fever, malaise/fatigue and weight loss.  HENT:  Negative for nosebleeds and sore throat.   Eyes:  Negative for double vision.  Respiratory:  Negative for hemoptysis, sputum production, shortness of breath and wheezing.   Cardiovascular:  Negative for chest pain, palpitations, orthopnea and leg swelling.  Gastrointestinal:  Negative for abdominal pain, blood in stool, constipation, diarrhea, heartburn, melena, nausea and vomiting.  Genitourinary:  Positive for frequency. Negative for dysuria and urgency.  Musculoskeletal:  Negative for back pain and joint pain.  Skin:  Negative for itching.  Neurological:  Positive for focal weakness. Negative for tingling, weakness and headaches.  Endo/Heme/Allergies:  Does not bruise/bleed easily.  Psychiatric/Behavioral:  Negative for depression. The patient is not nervous/anxious and  does not have insomnia.     MEDICAL HISTORY:  Past Medical History:  Diagnosis Date   Arthritis    Dysrhythmia    A-fib   Hip pain    left   HOH (hard of hearing)    LBBB (left bundle branch block) 08/05/2020   Lung cancer ([place])    Skin cancer, basal cell     face top of head     SURGICAL HISTORY: Past Surgical History:  Procedure Laterality Date   CATARACT EXTRACTION W/PHACO Right 07/13/2017   Procedure: CATARACT EXTRACTION PHACO AND INTRAOCULAR LENS PLACEMENT ([place]);  Surgeon: Leandrew Koyanagi, MD;  Location: [facility];  Service: Ophthalmology;  Laterality: Right;  IVA TOPICAL RIGHT   CATARACT EXTRACTION W/PHACO Left 01/04/2018   Procedure: CATARACT EXTRACTION PHACO AND INTRAOCULAR LENS PLACEMENT ([place]) LEFT;  Surgeon: Leandrew Koyanagi, MD;  Location: [place];  Service: Ophthalmology;  Laterality: Left;   COLONOSCOPY     INTRAMEDULLARY (IM) NAIL INTERTROCHANTERIC Right 01/03/2022   Procedure: INTRAMEDULLARY (IM) NAIL INTERTROCHANTRIC;  Surgeon: Corky Mull, MD;  Location: [facility];  Service: Orthopedics;  Laterality: Right;   IR IMAGING GUIDED PORT INSERTION  08/27/2020   JOINT REPLACEMENT     Left total hip Dr. Su Hoff 08-04-18   ROTATOR CUFF REPAIR Right    SKIN CANCER EXCISION     face   TOTAL HIP ARTHROPLASTY Left 08/04/2018   Procedure: TOTAL HIP ARTHROPLASTY ANTERIOR APPROACH;  Surgeon: Frederik Pear, MD;  [facility];  Service: Orthopedics;  Laterality: Left;   VIDEO BRONCHOSCOPY WITH ENDOBRONCHIAL NAVIGATION N/A 08/07/2020   Procedure: VIDEO BRONCHOSCOPY WITH ENDOBRONCHIAL NAVIGATION;  Surgeon: Ottie Glazier, MD;  Location: [facility];  Service: Thoracic;  Laterality: N/A;   VIDEO BRONCHOSCOPY WITH ENDOBRONCHIAL ULTRASOUND N/A 08/07/2020   Procedure: VIDEO BRONCHOSCOPY WITH ENDOBRONCHIAL ULTRASOUND;  Surgeon: Ottie Glazier, MD;  Location: [facility];  Service: Thoracic;  Laterality: N/A;    SOCIAL HISTORY: Social History   Socioeconomic History   Marital status: Married    Spouse name: Not on file   Number of children: Not on file   Years of education: Not on file   Highest education level: Not on file  Occupational History   Not on file  Tobacco Use   Smoking status: Former    Packs/day: 1.00    Years: 4.00     Pack years: 4.00    Types: Cigarettes   Smokeless tobacco: Never   Tobacco comments:    quit early 70's  Vaping Use   Vaping Use: Never used  Substance and Sexual Activity   Alcohol use: No   Drug use: No   Sexual activity: Not Currently  Other Topics Concern   Not on file  Social History Narrative   > quit 35 years; smoked for 15 years. Rare alcohol. 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    Social Determinants of Health   Financial Resource Strain: Not on file  Food Insecurity: Not on file  Transportation Needs: Not on file  Physical Activity: Not on file  Stress: Not on file  Social Connections: Not on file  Intimate Partner Violence: Not on file    FAMILY HISTORY: Family History  Problem Relation Age of Onset   Throat cancer Brother         & lung cancer    ALLERGIES:  has No Known Allergies.  MEDICATIONS:  Current Outpatient Medications  Medication  Sig Dispense Refill   acetaminophen (TYLENOL) 500 MG tablet Take 1 tablet (500 mg total) by mouth every 6 (six) hours as needed. 30 tablet 0   apixaban (ELIQUIS) 5 MG TABS tablet Take 5 mg by mouth 2 (two) times daily.     dexamethasone (DECADRON) 4 MG tablet Take 2 pills in AM; and 1 pill afternoon x1 week; and the 1 pill in AM; and 1 pill at noon x 1 weeks; and then half pill twice a day x 1 week-- Do NOT STOP [Take with food] 60 tablet 0   docusate sodium (COLACE) 100 MG capsule Take 1 capsule (100 mg total) by mouth 2 (two) times daily. 10 capsule 0   hydroxypropyl methylcellulose / hypromellose (ISOPTO TEARS / GONIOVISC) 2.5 % ophthalmic solution Place 1 drop into the left eye daily as needed for dry eyes.     lidocaine-prilocaine (EMLA) cream Apply 1 application topically as needed. Apply small amount to port site at least 1 hour prior to it being accessed, cover with plastic wrap 30 g 1   meloxicam (MOBIC) 15 MG tablet Take 15 mg by mouth daily.      Multiple Vitamin (MULTIVITAMIN) tablet Take 1 tablet by mouth daily.     pravastatin (PRAVACHOL) 80 MG tablet Take 80 mg by mouth at bedtime.      predniSONE (DELTASONE) 10 MG tablet Take by mouth.     saw palmetto 160 MG capsule Take 160 mg by mouth 2 (two) times daily.     tamsulosin (FLOMAX) 0.4 MG CAPS capsule Take 1 capsule (0.4 mg total) by mouth daily after supper. 30 capsule 6   HYDROcodone-acetaminophen (NORCO/VICODIN) 5-325 MG tablet Take 1 tablet by mouth every 6 (six) hours as needed for moderate pain. (Patient not taking: Reported on 01/28/2022) 30 tablet 0   neomycin-bacitracin-polymyxin (NEOSPORIN) ointment Apply topically daily. (Patient not taking: Reported on 02/26/2022) 15 g 0   ondansetron (ZOFRAN) 4 MG tablet Take 1 tablet (4 mg total) by mouth every 6 (six) hours as needed for nausea. (Patient not taking: Reported on 02/26/2022) 30 tablet 0   No current facility-administered medications for this visit.   Facility-Administered Medications Ordered in Other Visits  Medication Dose Route Frequency Provider Last Rate Last Admin   heparin lock flush 100 UNIT/ML injection            sodium chloride flush (NS) 0.9 % injection 10 mL  10 mL Intravenous PRN [name], MD   10 mL at 02/11/21 0835   PHYSICAL EXAMINATION: ECOG PERFORMANCE STATUS: 1 - Symptomatic but completely ambulatory  Vitals:   02/26/22 1003  BP: 94/60  Pulse: (!) 58  Resp: 16  Temp: (!) 96.6 F (35.9 C)  SpO2: 100%   Filed Weights   02/26/22 1003  Weight: 162 lb (73.5 kg)    Physical Exam HENT:     Head: Normocephalic and atraumatic.     Mouth/Throat:     Pharynx: No oropharyngeal exudate.  Eyes:     Pupils: Pupils are equal, round, and reactive to light.  Cardiovascular:     Rate and Rhythm: Normal rate and regular rhythm.  Pulmonary:     Effort: Pulmonary effort is normal. No respiratory distress.     Breath sounds: No wheezing.     Comments: Slightly decreased breath sounds  left lower lung base. Abdominal:     General: Bowel sounds are normal. There is no distension.     Palpations: Abdomen is soft. There is  no mass.     Tenderness: There is no abdominal tenderness. There is no guarding or rebound.  Musculoskeletal:        General: No tenderness. Normal range of motion.     Cervical back: Normal range of motion and neck supple.  Skin:    General: Skin is warm.  Neurological:     Mental Status: He is alert and oriented to person, place, and time.  Psychiatric:        Mood and Affect: Affect normal.     LABORATORY DATA:  I have reviewed the data as listed Lab Results  Component Value Date   WBC 8.0 02/26/2022   HGB 12.2 (L) 02/26/2022   HCT 36.9 (L) 02/26/2022   MCV 97.9 02/26/2022   PLT 283 02/26/2022   Recent Labs    01/14/22 1003 02/17/22 0858 02/26/22 0920  NA 129* 131* 137  K 4.1 3.8 3.8  CL 99 100 103  CO2 22 25 28   GLUCOSE 133* 93 99  BUN 26* 18 22  CREATININE 0.82 0.80 0.94  CALCIUM 7.7* 8.4* 8.5*  GFRNONAA >60 >60 >60  PROT 5.5* 6.9 6.5  ALBUMIN 2.6* 3.7 3.7  AST 28 23 27   ALT 22 15 21   ALKPHOS 168* 156* 130*  BILITOT 1.6* 1.0 0.5    RADIOGRAPHIC STUDIES: I have personally reviewed the radiological images as listed and agreed with the findings in the report. MR Brain W Wo Contrast  Result Date: 02/22/2022 CLINICAL DATA:  Brain/CNS neoplasm, assess treatment response EXAM: MRI HEAD WITHOUT AND WITH CONTRAST TECHNIQUE: Multiplanar, multiecho pulse sequences of the brain and surrounding structures were obtained without and with intravenous contrast. CONTRAST:  60m GADAVIST GADOBUTROL 1 MMOL/ML IV SOLN COMPARISON:  12/16/2021 FINDINGS: Brain: Peripherally enhancing lesion of the parasagittal right frontal lobe presently measures up to 1.8 cm craniocaudal (previously 2 cm). Surrounding edema has increased. There is no significant mass effect. No new mass or abnormal enhancement. Additional patchy foci of T2 hyperintensity in the  supratentorial and pontine white matter nonspecific but may reflect stable chronic microvascular ischemic changes. There is no acute infarction or hemorrhage. Few small foci of susceptibility reflecting chronic microhemorrhages are again noted. No hydrocephalus or extra-axial collection. Vascular: Major vessel flow voids at the skull base are preserved. Skull and upper cervical spine: Normal marrow signal is preserved. Sinuses/Orbits: Paranasal sinuses are aerated. Bilateral lens replacements. Other: Sella is unremarkable.  Mastoid air cells are clear. IMPRESSION: Small decrease in size of treated right frontal metastasis. Increased surrounding edema is likely related to treatment. No new metastasis. Electronically Signed   By: PMacy MisM.D.   On: 02/22/2022 16:55     ASSESSMENT & PLAN:   Stage 4 lung cancer, left ([place] #Stage IV- -non-small cell lung cancer-[favor adenocarcinoma] [at dx-stage III- s/p  concurrent chemoradiation; s/p  adjuvant durvalumab; finished Feb 2023]; with metachronous brain metastases [March 2023];  Patient CT scan February 2023 chest and pelvis-no evidence of any progressive disease.    #Clinically stable-continue monitoring  With Repeat imaging in 2 months; will order today.   # Brain metastases-March 2023-MRI-brain 2.1 cm s/p SBRT [March, 23rrd-2023]  MAY 2023-MRI: Small decrease in size of treated right frontal metastasis ;Increased surrounding edema is likely related to treatment. No new metastasis.  Given the worsening focal weakness left lower extremity-proceed with dexamethasone for the next 3 weeks.  Discussed with Dr. CDonella Stade  # Mild intermittent elevation of of Alk phos-? From Imfinzi- monitor for now-stable  #  left-sided pleural effusion-CT November 2022. Hold off any thoracentesis-stable  # A. fib on Eliquis [Dr.Kowalski]-  Mild swelling in legs- EF in Feb 2022- 55-60%; continue BIL compression stocking/leg elevation-stable  #Right hip fracture status  post ORIF.  Nonpathologic.  Improving s/p physical therapy.  #Difficulty with urination: Currently on Flomax;  currently awaiting evaluation with urology  # MediPort flush q 6 W-stable.  #  DISPOSITION: # follow up in 3 weeks- MD; labs- cbc/cmp;port flush--Dr.B   # I reviewed the blood work- with the patient in detail; also reviewed the imaging independently [as summarized above]; and with the patient in detail.            All questions were answered. The patient knows to call the clinic with any problems, questions or concerns.   [name], MD 02/27/2022 11:52 PM

## 2022-02-27 ENCOUNTER — Encounter: Payer: Self-pay | Admitting: Internal Medicine

## 2022-03-09 ENCOUNTER — Encounter: Payer: Self-pay | Admitting: Urology

## 2022-03-09 ENCOUNTER — Ambulatory Visit (INDEPENDENT_AMBULATORY_CARE_PROVIDER_SITE_OTHER): Payer: Medicare Other | Admitting: Urology

## 2022-03-09 ENCOUNTER — Encounter: Payer: Self-pay | Admitting: Internal Medicine

## 2022-03-09 VITALS — BP 114/66 | HR 73 | Ht 72.0 in | Wt 162.0 lb

## 2022-03-09 DIAGNOSIS — R351 Nocturia: Secondary | ICD-10-CM | POA: Diagnosis not present

## 2022-03-09 LAB — URINALYSIS, COMPLETE
Bilirubin, UA: NEGATIVE
Glucose, UA: NEGATIVE
Ketones, UA: NEGATIVE
Leukocytes,UA: NEGATIVE
Nitrite, UA: NEGATIVE
Protein,UA: NEGATIVE
RBC, UA: NEGATIVE
Specific Gravity, UA: 1.015 (ref 1.005–1.030)
Urobilinogen, Ur: 0.2 mg/dL (ref 0.2–1.0)
pH, UA: 6 (ref 5.0–7.5)

## 2022-03-09 LAB — MICROSCOPIC EXAMINATION
Bacteria, UA: NONE SEEN
RBC, Urine: NONE SEEN /hpf (ref 0–2)

## 2022-03-09 LAB — BLADDER SCAN AMB NON-IMAGING: Scan Result: 3

## 2022-03-09 MED ORDER — GEMTESA 75 MG PO TABS: 75.0000 mg | ORAL_TABLET | Freq: Every day | ORAL | 0 refills | Status: DC

## 2022-03-09 NOTE — Progress Notes (Signed)
03/10/22 7:52 AM   Jason Warren 1929/01/20 [id number]  Referring provider:  Irean Hong, NP [place],  [place] [id number] Chief Complaint  Patient presents with   Cysto   Nocturia    HPI: Jason Warren is a 88 y.o.male who presents today for further evaluation of nocturia.  He is accompanied today by his wife and daughter.  He is followed by oncology for stage IV lung cancer including brain metastasis currently on steroids for edema.  He reports that his symptoms have been ongoing for few years but worsened over the last several months.  He is currently on Flomax which has been taking for few months has not seen much difference in his urinary symptoms.. His most recent visit on 02/26/2022 with Dr Rogue Bussing, he was noted to have difficulty urinating.  He reports that he is unsure if the urge wakes him up during the night. His symptoms are mostly during the nighttime his urinates less during the daytime. He feels that he is emptying adequately. He reports that in the past he had a sleep study he does not have a CPAP, he is unsure if he had sleep apnea.  He does have some lower extremity edema, wears compression stockings secondary to recent hip fracture.  In terms of behavior, he does drink fluids throughout the day including up until bedtime.  He eats dinner around 7 PM and goes to bed around 9.  He wakes up very early in the morning.  UA unremarkable today, IPSS of 25 today , and PVR of 3 ml.    IPSS     Row Name 03/09/22 0900         International Prostate Symptom Score   How often have you had the sensation of not emptying your bladder? About half the time     How often have you had to urinate less than every two hours? Almost always     How often have you found you stopped and started again several times when you urinated? More than half the time     How often have you found it difficult to postpone urination? More than half the time     How often  have you had a weak urinary stream? Less than half the time     How often have you had to strain to start urination? Less than half the time     How many times did you typically get up at night to urinate? 5 Times     Total IPSS Score 25       Quality of Life due to urinary symptoms   If you were to spend the rest of your life with your urinary condition just the way it is now how would you feel about that? Unhappy              Score:  1-7 Mild 8-19 Moderate 20-35 Severe   PMH: Past Medical History:  Diagnosis Date   Arthritis    Dysrhythmia    A-fib   Hip pain    left   HOH (hard of hearing)    LBBB (left bundle branch block) 08/05/2020   Lung cancer ([place])    Skin cancer, basal cell     face top of head    Surgical History: Past Surgical History:  Procedure Laterality Date   CATARACT EXTRACTION W/PHACO Right 07/13/2017   Procedure: CATARACT EXTRACTION PHACO AND INTRAOCULAR LENS PLACEMENT ([place]);  Surgeon: Leandrew Koyanagi, MD;  Location: [facility];  Service: Ophthalmology;  Laterality: Right;  IVA TOPICAL RIGHT   CATARACT EXTRACTION W/PHACO Left 01/04/2018   Procedure: CATARACT EXTRACTION PHACO AND INTRAOCULAR LENS PLACEMENT ([place]) LEFT;  Surgeon: Leandrew Koyanagi, MD;  Location: [facility];  Service: Ophthalmology;  Laterality: Left;   COLONOSCOPY     INTRAMEDULLARY (IM) NAIL INTERTROCHANTERIC Right 01/03/2022   Procedure: INTRAMEDULLARY (IM) NAIL INTERTROCHANTRIC;  Surgeon: Corky Mull, MD;  Location: [facility];  Service: Orthopedics;  Laterality: Right;   IR IMAGING GUIDED PORT INSERTION  08/27/2020   JOINT REPLACEMENT     Left total hip Dr. Su Hoff 08-04-18   ROTATOR CUFF REPAIR Right    SKIN CANCER EXCISION     face   TOTAL HIP ARTHROPLASTY Left 08/04/2018   Procedure: TOTAL HIP ARTHROPLASTY ANTERIOR APPROACH;  Surgeon: Frederik Pear, MD;  [facility];  Service: Orthopedics;  Laterality: Left;   VIDEO BRONCHOSCOPY WITH  ENDOBRONCHIAL NAVIGATION N/A 08/07/2020   Procedure: VIDEO BRONCHOSCOPY WITH ENDOBRONCHIAL NAVIGATION;  Surgeon: Ottie Glazier, MD;  Location: [facility];  Service: Thoracic;  Laterality: N/A;   VIDEO BRONCHOSCOPY WITH ENDOBRONCHIAL ULTRASOUND N/A 08/07/2020   Procedure: VIDEO BRONCHOSCOPY WITH ENDOBRONCHIAL ULTRASOUND;  Surgeon: Ottie Glazier, MD;  Location: [facility];  Service: Thoracic;  Laterality: N/A;    Home Medications:  Allergies as of 03/09/2022   No Known Allergies      Medication List        Accurate as of March 09, 2022 11:59 PM. If you have any questions, ask your nurse or doctor.          STOP taking these medications    HYDROcodone-acetaminophen 5-325 MG tablet Commonly known as: NORCO/VICODIN Stopped by: Hollice Espy, MD   ondansetron 4 MG tablet Commonly known as: ZOFRAN Stopped by: Hollice Espy, MD   predniSONE 10 MG tablet Commonly known as: DELTASONE Stopped by: Hollice Espy, MD       TAKE these medications    acetaminophen 500 MG tablet Commonly known as: TYLENOL Take 1 tablet (500 mg total) by mouth every 6 (six) hours as needed.   apixaban 5 MG Tabs tablet Commonly known as: ELIQUIS Take 5 mg by mouth 2 (two) times daily.   dexamethasone 4 MG tablet Commonly known as: DECADRON Take 2 pills in AM; and 1 pill afternoon x1 week; and the 1 pill in AM; and 1 pill at noon x 1 weeks; and then half pill twice a day x 1 week-- Do NOT STOP [Take with food]   docusate sodium 100 MG capsule Commonly known as: COLACE Take 1 capsule (100 mg total) by mouth 2 (two) times daily.   Gemtesa 75 MG Tabs Generic drug: Vibegron Take 75 mg by mouth daily. Started by: Hollice Espy, MD   hydroxypropyl methylcellulose / hypromellose 2.5 % ophthalmic solution Commonly known as: ISOPTO TEARS / GONIOVISC Place 1 drop into the left eye daily as needed for dry eyes.   lidocaine-prilocaine cream Commonly known as: EMLA Apply 1 application topically as  needed. Apply small amount to port site at least 1 hour prior to it being accessed, cover with plastic wrap   meloxicam 15 MG tablet Commonly known as: MOBIC Take 15 mg by mouth daily.   multivitamin tablet Take 1 tablet by mouth daily.   neomycin-bacitracin-polymyxin [id number] ointment Commonly known as: NEOSPORIN Apply topically daily.   pravastatin 80 MG tablet Commonly known as: PRAVACHOL Take 80 mg by mouth at bedtime.   saw palmetto 160 MG  capsule Take 160 mg by mouth 2 (two) times daily.   tamsulosin 0.4 MG Caps capsule Commonly known as: FLOMAX Take 1 capsule (0.4 mg total) by mouth daily after supper.        Allergies:  No Known Allergies  Family History: Family History  Problem Relation Age of Onset   Throat cancer Brother         & lung cancer    Social History:  reports that he has quit smoking. His smoking use included cigarettes. He has a 4.00 pack-year smoking history. He has never used smokeless tobacco. He reports that he does not drink alcohol and does not use drugs.   Physical Exam: BP 114/66   Pulse 73   Ht 6' (1.829 m)   Wt 162 lb (73.5 kg)   BMI 21.97 kg/m   Constitutional:  Alert and oriented, No acute distress.  In a wheelchair.   HEENT: [place] AT, moist mucus membranes.  Trachea midline, no masses. Cardiovascular: No clubbing, cyanosis, or edema. Respiratory: Normal respiratory effort, no increased work of breathing. Skin: No rashes, bruises or suspicious lesions. Neurologic: Grossly intact, no focal deficits, moving all 4 extremities. Psychiatric: Normal mood and affect.  Laboratory Data: Lab Results  Component Value Date   CREATININE 0.94 02/26/2022    Pertinent Imaging: Results for orders placed or performed in visit on 03/09/22  Microscopic Examination   Urine  Result Value Ref Range   WBC, UA 0-5 0 - 5 /hpf   RBC None seen 0 - 2 /hpf   Epithelial Cells (non renal) 0-10 0 - 10 /hpf   Bacteria, UA None seen None seen/Few   Urinalysis, Complete  Result Value Ref Range   Specific Gravity, UA 1.015 1.005 - 1.030   pH, UA 6.0 5.0 - 7.5   Color, UA Yellow Yellow   Appearance Ur Clear Clear   Leukocytes,UA Negative Negative   Protein,UA Negative Negative/Trace   Glucose, UA Negative Negative   Ketones, UA Negative Negative   RBC, UA Negative Negative   Bilirubin, UA Negative Negative   Urobilinogen, Ur 0.2 0.2 - 1.0 mg/dL   Nitrite, UA Negative Negative   Microscopic Examination See below:   Bladder Scan (Post Void Residual) in office  Result Value Ref Range   Scan Result 3      Assessment & Plan:    Nocturia  -Likely multifactorial including age-related changes, behavioral, secondary to lower extremity edema as well as possible underlying sleep apnea and chronic BPH - UA negative for infection today - He is emptying adequately today with PVR of 71ml -  He believes he had a sleep study in the past. Recommend he discuss sleep study with his PCP - Will have him stop Flomax as it is not improving symptoms and has a side effect of dizziness; in light of recent hip fractures concern for risk of falls - Discussed behavioral management such as stopping fluid intake 4 hours before bedtime.  - Offered him medication he is interested in this today.  - Trial of Gemtesa 75 mg given today, let us know if this helps his urinary symptoms at the end of the month -Not appropriate for PSA screening based on age and comorbidities   Conley Rolls as a scribe for Hollice Espy, MD.,have documented all relevant documentation on the behalf of Hollice Espy, MD,as directed by  Hollice Espy, MD while in the presence of Hollice Espy, MD.  I have reviewed the above documentation for accuracy and completeness,  and I agree with the above.   Hollice Espy, MD   [facility] [address] [place], [place] [address] [phone]  I spent 47 total minutes on the day of the  encounter including pre-visit review of the medical record, face-to-face time with the patient, and post visit ordering of labs/imaging/tests.  The majority of the time was spent face-to-face with the patient and his wife and daughter and discussion as above.

## 2022-03-15 ENCOUNTER — Encounter: Payer: Self-pay | Admitting: Internal Medicine

## 2022-03-16 ENCOUNTER — Encounter: Payer: Self-pay | Admitting: Internal Medicine

## 2022-03-19 ENCOUNTER — Encounter: Payer: Self-pay | Admitting: Internal Medicine

## 2022-03-19 ENCOUNTER — Inpatient Hospital Stay: Payer: Medicare Other

## 2022-03-19 ENCOUNTER — Inpatient Hospital Stay: Payer: Medicare Other | Attending: Internal Medicine | Admitting: Internal Medicine

## 2022-03-19 ENCOUNTER — Other Ambulatory Visit: Payer: Self-pay

## 2022-03-19 VITALS — BP 129/82 | HR 66 | Temp 96.9°F | Ht 72.0 in | Wt 163.5 lb

## 2022-03-19 DIAGNOSIS — Z452 Encounter for adjustment and management of vascular access device: Secondary | ICD-10-CM | POA: Insufficient documentation

## 2022-03-19 DIAGNOSIS — C3432 Malignant neoplasm of lower lobe, left bronchus or lung: Secondary | ICD-10-CM

## 2022-03-19 DIAGNOSIS — D696 Thrombocytopenia, unspecified: Secondary | ICD-10-CM | POA: Insufficient documentation

## 2022-03-19 DIAGNOSIS — Z7901 Long term (current) use of anticoagulants: Secondary | ICD-10-CM | POA: Diagnosis not present

## 2022-03-19 DIAGNOSIS — I4891 Unspecified atrial fibrillation: Secondary | ICD-10-CM | POA: Insufficient documentation

## 2022-03-19 DIAGNOSIS — C3492 Malignant neoplasm of unspecified part of left bronchus or lung: Secondary | ICD-10-CM

## 2022-03-19 DIAGNOSIS — C7931 Secondary malignant neoplasm of brain: Secondary | ICD-10-CM | POA: Insufficient documentation

## 2022-03-19 DIAGNOSIS — Z95828 Presence of other vascular implants and grafts: Secondary | ICD-10-CM

## 2022-03-19 LAB — CBC WITH DIFFERENTIAL/PLATELET
Abs Immature Granulocytes: 0.23 10*3/uL — ABNORMAL HIGH (ref 0.00–0.07)
Basophils Absolute: 0 10*3/uL (ref 0.0–0.1)
Basophils Relative: 0 %
Eosinophils Absolute: 0 10*3/uL (ref 0.0–0.5)
Eosinophils Relative: 0 %
HCT: 41.2 % (ref 39.0–52.0)
Hemoglobin: 13.8 g/dL (ref 13.0–17.0)
Immature Granulocytes: 3 %
Lymphocytes Relative: 3 %
Lymphs Abs: 0.3 10*3/uL — ABNORMAL LOW (ref 0.7–4.0)
MCH: 32.3 pg (ref 26.0–34.0)
MCHC: 33.5 g/dL (ref 30.0–36.0)
MCV: 96.5 fL (ref 80.0–100.0)
Monocytes Absolute: 0.3 10*3/uL (ref 0.1–1.0)
Monocytes Relative: 3 %
Neutro Abs: 8.2 10*3/uL — ABNORMAL HIGH (ref 1.7–7.7)
Neutrophils Relative %: 91 %
Platelets: 83 10*3/uL — ABNORMAL LOW (ref 150–400)
RBC: 4.27 MIL/uL (ref 4.22–5.81)
RDW: 15.8 % — ABNORMAL HIGH (ref 11.5–15.5)
WBC: 9 10*3/uL (ref 4.0–10.5)
nRBC: 0 % (ref 0.0–0.2)

## 2022-03-19 LAB — COMPREHENSIVE METABOLIC PANEL
ALT: 64 U/L — ABNORMAL HIGH (ref 0–44)
AST: 42 U/L — ABNORMAL HIGH (ref 15–41)
Albumin: 3.2 g/dL — ABNORMAL LOW (ref 3.5–5.0)
Alkaline Phosphatase: 117 U/L (ref 38–126)
Anion gap: 7 (ref 5–15)
BUN: 22 mg/dL (ref 8–23)
CO2: 26 mmol/L (ref 22–32)
Calcium: 7.9 mg/dL — ABNORMAL LOW (ref 8.9–10.3)
Chloride: 98 mmol/L (ref 98–111)
Creatinine, Ser: 0.77 mg/dL (ref 0.61–1.24)
GFR, Estimated: >60 mL/min (ref >60)
Glucose, Bld: 123 mg/dL — ABNORMAL HIGH (ref 70–99)
Potassium: 4.5 mmol/L (ref 3.5–5.1)
Sodium: 131 mmol/L — ABNORMAL LOW (ref 135–145)
Total Bilirubin: 1.1 mg/dL (ref 0.3–1.2)
Total Protein: 5.8 g/dL — ABNORMAL LOW (ref 6.5–8.1)

## 2022-03-19 LAB — TECHNOLOGIST SMEAR REVIEW
Plt Morphology: NORMAL
RBC MORPHOLOGY: NORMAL
WBC MORPHOLOGY: NORMAL

## 2022-03-19 LAB — IMMATURE PLATELET FRACTION: Immature Platelet Fraction: 6.4 % (ref 1.2–8.6)

## 2022-03-19 MED ORDER — DEXAMETHASONE 1 MG PO TABS: ORAL_TABLET | ORAL | 0 refills | Status: DC

## 2022-03-19 MED ORDER — SODIUM CHLORIDE 0.9% FLUSH
10.0000 mL | Freq: Once | INTRAVENOUS | Status: AC
  Administered 2022-03-19: 10 mL via INTRAVENOUS
  Filled 2022-03-19: qty 10

## 2022-03-19 MED ORDER — HEPARIN SOD (PORK) LOCK FLUSH 100 UNIT/ML IV SOLN
500.0000 [IU] | Freq: Once | INTRAVENOUS | Status: AC
  Administered 2022-03-19: 500 [IU] via INTRAVENOUS
  Filled 2022-03-19: qty 5

## 2022-03-19 NOTE — Progress Notes (Signed)
[facility] NOTE  Patient Care Team: Baxter Hire, MD as PCP - General (Internal Medicine) [name], RN as Oncology Nurse Navigator [name], MD as Consulting Physician (Hematology and Oncology)  CHIEF COMPLAINTS/PURPOSE OF CONSULTATION: Lung cancer  #  Oncology History Overview Note  # NOV 2021- LEFT LOWER LOBE NON-SMALL CELL CA [favor adeno ca]; T2N3 [right hilar; subcarinal LN;[name]; NOV 2021-MRI [place]  # 11/30- carbo-Taxol-RT [RT until 10/23/20]; s/p IMFINZI on 2/24. [83 months]; finished February 2023.  # #Brain metastases-March 2023-MRI-brain 2.1 cm peripherally enhancing lesion in the right paracentral lobule concerning for metastatic disease. There is mild perilesional edema and regional mass effect but no midline shift. On dexamethasone 2 mg a day.  Noted to have significant movement of the left lower extremity weakness.  Plan with SBRT on 3/22  # Right lower lobe - ~4.6 x 3.1 cm  [PET 07-2020]- demonstrates no significant hypermetabolic activity (SUV max 1.8]-benign. STABLE.     # A.fib [Eliquis; Dr.Kowalski]  # # SURVIVORSHIP:   # GENETICS:   #NGS-ordered  DIAGNOSIS: Lung cancer  STAGE:   III      ;  GOALS:  cure  CURRENT/MOST RECENT THERAPY : carbo-Taxol-RT ]   Stage 4 lung cancer, left ([place])  08/14/2020 Initial Diagnosis   Cancer of lower lobe of left lung ([place])   09/02/2020 - 10/22/2020 Chemotherapy    Patient is on Treatment Plan: LUNG DURVALUMAB Q14D      10/16/2020 Cancer Staging   Staging form: Lung, AJCC 8th Edition - Clinical: Stage IIIB (cT2a, cN3, cM0) - Signed by [name], MD on 10/16/2020   12/17/2020 -  Chemotherapy   Patient is on Treatment Plan : LUNG Durvalumab q14d      HISTORY OF PRESENTING ILLNESS: Patient in wheelchair.  Jason Warren is accompanied by his wife/daughter.  Jason Warren 88 y.o.  male patient with currently stage IV [metachronus stage III lung non-small cell  lung; s/p treatment feb 2023 with Brain mets status post [facility] 2023 is here for a follow up.  Patient was started on dexamethasone at last visit-given worsening left lower extremity weakness/MRI-June showing worsening edema.  Noted to have improvement of the left lower extremity weakness.  Jason Warren is not limping at this time.   However in the interim noted to have worsening right finger swelling-x-ray suggestive of fracture.  Follow-up with PCP.  Patient not seeking interventions; interested in conservative measures.  Patient denies any shortness of breath.  Intermittent cough not any worse.  Intermittent difficulty swallowing-not any worse.  Denies any swelling in the legs.    Does complain of difficulty sleeping.  No bleeding.  Review of Systems  Constitutional:  Negative for chills, diaphoresis, fever, malaise/fatigue and weight loss.  HENT:  Negative for nosebleeds and sore throat.   Eyes:  Negative for double vision.  Respiratory:  Negative for hemoptysis, sputum production, shortness of breath and wheezing.   Cardiovascular:  Negative for chest pain, palpitations, orthopnea and leg swelling.  Gastrointestinal:  Negative for abdominal pain, blood in stool, constipation, diarrhea, heartburn, melena, nausea and vomiting.  Genitourinary:  Positive for frequency. Negative for dysuria and urgency.  Musculoskeletal:  Negative for back pain and joint pain.  Skin:  Negative for itching.  Neurological:  Positive for focal weakness. Negative for tingling, weakness and headaches.  Endo/Heme/Allergies:  Does not bruise/bleed easily.  Psychiatric/Behavioral:  Negative for depression. The patient is not nervous/anxious and does not have insomnia.  MEDICAL HISTORY:  Past Medical History:  Diagnosis Date   Arthritis    Dysrhythmia    A-fib   Hip pain    left   HOH (hard of hearing)    LBBB (left bundle branch block) 08/05/2020   Lung cancer ([place])    Skin cancer, basal cell     face top  of head    SURGICAL HISTORY: Past Surgical History:  Procedure Laterality Date   CATARACT EXTRACTION W/PHACO Right 07/13/2017   Procedure: CATARACT EXTRACTION PHACO AND INTRAOCULAR LENS PLACEMENT ([place]);  Surgeon: Leandrew Koyanagi, MD;  Location: [facility];  Service: Ophthalmology;  Laterality: Right;  IVA TOPICAL RIGHT   CATARACT EXTRACTION W/PHACO Left 01/04/2018   Procedure: CATARACT EXTRACTION PHACO AND INTRAOCULAR LENS PLACEMENT ([place]) LEFT;  Surgeon: Leandrew Koyanagi, MD;  Location: [facility];  Service: Ophthalmology;  Laterality: Left;   COLONOSCOPY     FEMUR FRACTURE SURGERY Right 01/03/2022   INTRAMEDULLARY (IM) NAIL INTERTROCHANTERIC Right 01/03/2022   Procedure: INTRAMEDULLARY (IM) NAIL INTERTROCHANTRIC;  Surgeon: Corky Mull, MD;  Location: [facility];  Service: Orthopedics;  Laterality: Right;   IR IMAGING GUIDED PORT INSERTION  08/27/2020   JOINT REPLACEMENT     Left total hip Dr. Su Hoff 08-04-18   ROTATOR CUFF REPAIR Right    SKIN CANCER EXCISION     face   TOTAL HIP ARTHROPLASTY Left 08/04/2018   Procedure: TOTAL HIP ARTHROPLASTY ANTERIOR APPROACH;  Surgeon: Frederik Pear, MD;  [facility];  Service: Orthopedics;  Laterality: Left;   VIDEO BRONCHOSCOPY WITH ENDOBRONCHIAL NAVIGATION N/A 08/07/2020   Procedure: VIDEO BRONCHOSCOPY WITH ENDOBRONCHIAL NAVIGATION;  Surgeon: Ottie Glazier, MD;  Location: [facility];  Service: Thoracic;  Laterality: N/A;   VIDEO BRONCHOSCOPY WITH ENDOBRONCHIAL ULTRASOUND N/A 08/07/2020   Procedure: VIDEO BRONCHOSCOPY WITH ENDOBRONCHIAL ULTRASOUND;  Surgeon: Ottie Glazier, MD;  Location: [facility];  Service: Thoracic;  Laterality: N/A;    SOCIAL HISTORY: Social History   Socioeconomic History   Marital status: Married    Spouse name: Not on file   Number of children: Not on file   Years of education: Not on file   Highest education level: Not on file  Occupational History   Not on file  Tobacco Use    Smoking status: Former    Packs/day: 1.00    Years: 4.00    Total pack years: 4.00    Types: Cigarettes   Smokeless tobacco: Never   Tobacco comments:    quit early 70's  Vaping Use   Vaping Use: Never used  Substance and Sexual Activity   Alcohol use: No   Drug use: No   Sexual activity: Not Currently  Other Topics Concern   Not on file  Social History Narrative   > quit 35 years; smoked for 15 years. Rare alcohol. 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    Social Determinants of Health   Financial Resource Strain: Not on file  Food Insecurity: Not on file  Transportation Needs: Not on file  Physical Activity: Not on file  Stress: Not on file  Social Connections: Not on file  Intimate Partner Violence: Not on file    FAMILY HISTORY: Family History  Problem Relation Age of Onset   Throat cancer Brother         & lung cancer    ALLERGIES:  has No Known Allergies.  MEDICATIONS:  Current Outpatient Medications  Medication  Sig Dispense Refill   acetaminophen (TYLENOL) 500 MG tablet Take 1 tablet (500 mg total) by mouth every 6 (six) hours as needed. 30 tablet 0   apixaban (ELIQUIS) 5 MG TABS tablet Take 5 mg by mouth 2 (two) times daily.     dexamethasone (DECADRON) 1 MG tablet Take 1 pill a day for the next 2 weeks; and then take half a pill a day-do not stop until next visit.  Take it in the morning; after breakfast. 30 tablet 0   dexamethasone (DECADRON) 4 MG tablet Take 2 pills in AM; and 1 pill afternoon x1 week; and the 1 pill in AM; and 1 pill at noon x 1 weeks; and then half pill twice a day x 1 week-- Do NOT STOP [Take with food] 60 tablet 0   docusate sodium (COLACE) 100 MG capsule Take 1 capsule (100 mg total) by mouth 2 (two) times daily. 10 capsule 0   hydroxypropyl methylcellulose / hypromellose (ISOPTO TEARS / GONIOVISC) 2.5 % ophthalmic solution Place 1 drop into the left eye daily as needed for  dry eyes.     lidocaine-prilocaine (EMLA) cream Apply 1 application topically as needed. Apply small amount to port site at least 1 hour prior to it being accessed, cover with plastic wrap 30 g 1   meloxicam (MOBIC) 15 MG tablet Take 15 mg by mouth daily.     Multiple Vitamin (MULTIVITAMIN) tablet Take 1 tablet by mouth daily.     neomycin-bacitracin-polymyxin (NEOSPORIN) ointment Apply topically daily. 15 g 0   pravastatin (PRAVACHOL) 80 MG tablet Take 80 mg by mouth at bedtime.      saw palmetto 160 MG capsule Take 160 mg by mouth 2 (two) times daily.     tamsulosin (FLOMAX) 0.4 MG CAPS capsule Take 1 capsule (0.4 mg total) by mouth daily after supper. 30 capsule 6   Vibegron (GEMTESA) 75 MG TABS Take 75 mg by mouth daily. 30 tablet 0   No current facility-administered medications for this visit.   Facility-Administered Medications Ordered in Other Visits  Medication Dose Route Frequency Provider Last Rate Last Admin   heparin lock flush 100 UNIT/ML injection            sodium chloride flush (NS) 0.9 % injection 10 mL  10 mL Intravenous PRN [name], MD   10 mL at 02/11/21 0835   PHYSICAL EXAMINATION: ECOG PERFORMANCE STATUS: 1 - Symptomatic but completely ambulatory  Vitals:   03/19/22 1052  BP: 129/82  Pulse: 66  Temp: (!) 96.9 F (36.1 C)  SpO2: 99%   Filed Weights   03/19/22 1052  Weight: 163 lb 8 oz (74.2 kg)    Physical Exam HENT:     Head: Normocephalic and atraumatic.     Mouth/Throat:     Pharynx: No oropharyngeal exudate.  Eyes:     Pupils: Pupils are equal, round, and reactive to light.  Cardiovascular:     Rate and Rhythm: Normal rate and regular rhythm.  Pulmonary:     Effort: Pulmonary effort is normal. No respiratory distress.     Breath sounds: No wheezing.     Comments: Slightly decreased breath sounds left lower lung base. Abdominal:     General: Bowel sounds are normal. There is no distension.     Palpations: Abdomen is soft. There is  no mass.     Tenderness: There is no abdominal tenderness. There is no guarding or rebound.  Musculoskeletal:  General: No tenderness. Normal range of motion.     Cervical back: Normal range of motion and neck supple.  Skin:    General: Skin is warm.  Neurological:     Mental Status: Jason Warren is alert and oriented to person, place, and time.  Psychiatric:        Mood and Affect: Affect normal.      LABORATORY DATA:  I have reviewed the data as listed Lab Results  Component Value Date   WBC 9.0 03/19/2022   HGB 13.8 03/19/2022   HCT 41.2 03/19/2022   MCV 96.5 03/19/2022   PLT 83 (L) 03/19/2022   Recent Labs    02/17/22 0858 02/26/22 0920 03/19/22 1018  NA 131* 137 131*  K 3.8 3.8 4.5  CL 100 103 98  CO2 25 28 26   GLUCOSE 93 99 123*  BUN 18 22 22   CREATININE 0.80 0.94 0.77  CALCIUM 8.4* 8.5* 7.9*  GFRNONAA >60 >60 >60  PROT 6.9 6.5 5.8*  ALBUMIN 3.7 3.7 3.2*  AST 23 27 42*  ALT 15 21 64*  ALKPHOS 156* 130* 117  BILITOT 1.0 0.5 1.1    RADIOGRAPHIC STUDIES: I have personally reviewed the radiological images as listed and agreed with the findings in the report. MR Brain W Wo Contrast  Result Date: 02/22/2022 CLINICAL DATA:  Brain/CNS neoplasm, assess treatment response EXAM: MRI HEAD WITHOUT AND WITH CONTRAST TECHNIQUE: Multiplanar, multiecho pulse sequences of the brain and surrounding structures were obtained without and with intravenous contrast. CONTRAST:  90m GADAVIST GADOBUTROL 1 MMOL/ML IV SOLN COMPARISON:  12/16/2021 FINDINGS: Brain: Peripherally enhancing lesion of the parasagittal right frontal lobe presently measures up to 1.8 cm craniocaudal (previously 2 cm). Surrounding edema has increased. There is no significant mass effect. No new mass or abnormal enhancement. Additional patchy foci of T2 hyperintensity in the supratentorial and pontine white matter nonspecific but may reflect stable chronic microvascular ischemic changes. There is no acute infarction or  hemorrhage. Few small foci of susceptibility reflecting chronic microhemorrhages are again noted. No hydrocephalus or extra-axial collection. Vascular: Major vessel flow voids at the skull base are preserved. Skull and upper cervical spine: Normal marrow signal is preserved. Sinuses/Orbits: Paranasal sinuses are aerated. Bilateral lens replacements. Other: Sella is unremarkable.  Mastoid air cells are clear. IMPRESSION: Small decrease in size of treated right frontal metastasis. Increased surrounding edema is likely related to treatment. No new metastasis. Electronically Signed   By: PMacy MisM.D.   On: 02/22/2022 16:55     ASSESSMENT & PLAN:   Stage 4 lung cancer, left ([place] #Stage IV- -non-small cell lung cancer-[favor adenocarcinoma] [at dx-stage III- s/p  concurrent chemoradiation; s/p  adjuvant durvalumab; finished Feb 2023]; with metachronous brain metastases [March 2023];  Patient CT scan February 2023 chest and pelvis-no evidence of any progressive disease.  We will plan imaging-next visit after resolution of acute issues/see below.  Stable  # Brain metastases-March 2023-MRI-brain 2.1 cm s/p SBRT [March, 23rrd-2023]  MAY 2023-MRI: Small decrease in size of treated right frontal metastasis ;Increased surrounding edema is likely related to treatment.  Currently status post steroids improved.  However I would recommend slow taper dexamethasone 160mx 2 week;s then 1/2 pills x1 week for the next 3 weeks. Will order MRI at next visit.  Discussed that if this is not resolved would recommend Avastin-however as a last resort.  # Mild intermittent elevation of of Alk phos/mild AST ALT elevation-? [place] for now-  STABLE  #Thrombocytopenia  mild new 83 platelets; no bleeding.  Unclear etiology monitor closely on Eliquis.  Check immature platelet fraction B12.  Review of smear.   # A. fib on Eliquis [Dr.Kowalski]-  Mild swelling in legs- EF in Feb 2022- 55-60%-   # Right hip  fracture status post ORIF.  Nonpathologic.  Improving s/p physical therapy.  #Difficulty with urination: Status post evaluation with urology no PVR; discontinued Flomax.  Recommended Gemtesa-patient [place].  # MediPort flush q 6 W-stable. * port flush- 6/16 #  DISPOSITION: # add-immature platelet fraction to labs today.  # follow up in 3 weeks- MD; labs- cbc/cmp;--Dr.B           All questions were answered. The patient knows to call the clinic with any problems, questions or concerns.   [name], MD 03/19/2022 1:15 PM

## 2022-03-19 NOTE — Assessment & Plan Note (Addendum)
#  Stage IV- -non-small cell lung cancer-[favor adenocarcinoma] [at dx-stage III- s/p  concurrent chemoradiation; s/p  adjuvant durvalumab; finished Feb 2023]; with metachronous brain metastases [March 2023];  Patient CT scan February 2023 chest and pelvis-no evidence of any progressive disease.  We will plan imaging-next visit after resolution of acute issues/see below.  Stable  # Brain metastases-March 2023-MRI-brain 2.1 cm s/p SBRT [March, 23rrd-2023]  MAY 2023-MRI: Small decrease in size of treated right frontal metastasis ;Increased surrounding edema is likely related to treatment.  Currently status post steroids improved.  However I would recommend slow taper dexamethasone 1mg  x 2 week;s then 1/2 pills x1 week for the next 3 weeks. Will order MRI at next visit.  Discussed that if this is not resolved would recommend Avastin-however as a last resort.  # Mild intermittent elevation of of Alk phos/mild AST ALT elevation-? From [place]- monitor for now-  STABLE  #Thrombocytopenia mild new 83 platelets; no bleeding.  Unclear etiology monitor closely on Eliquis.  Check immature platelet fraction B12.  Review of smear.   # A. fib on Eliquis [Dr.Kowalski]-  Mild swelling in legs- EF in Feb 2022- 55-60%-   # Right hip fracture status post ORIF.  Nonpathologic.  Improving s/p physical therapy.  #Difficulty with urination: Status post evaluation with urology no PVR; discontinued Flomax.  Recommended Gemtesa-patient [place].  # MediPort flush q 6 W-stable. * port flush- 6/16 #  DISPOSITION: # add-immature platelet fraction to labs today.  # follow up in 3 weeks- MD; labs- cbc/cmp;--Dr.B

## 2022-04-09 ENCOUNTER — Inpatient Hospital Stay: Payer: Medicare Other | Attending: Internal Medicine | Admitting: Internal Medicine

## 2022-04-09 ENCOUNTER — Telehealth: Payer: Self-pay

## 2022-04-09 ENCOUNTER — Encounter: Payer: Self-pay | Admitting: Internal Medicine

## 2022-04-09 ENCOUNTER — Inpatient Hospital Stay: Payer: Medicare Other

## 2022-04-09 VITALS — BP 117/72 | HR 79 | Temp 96.8°F | Ht 72.0 in | Wt 162.3 lb

## 2022-04-09 DIAGNOSIS — C3490 Malignant neoplasm of unspecified part of unspecified bronchus or lung: Secondary | ICD-10-CM | POA: Diagnosis not present

## 2022-04-09 DIAGNOSIS — M7989 Other specified soft tissue disorders: Secondary | ICD-10-CM | POA: Diagnosis not present

## 2022-04-09 DIAGNOSIS — R5383 Other fatigue: Secondary | ICD-10-CM | POA: Diagnosis not present

## 2022-04-09 DIAGNOSIS — I4891 Unspecified atrial fibrillation: Secondary | ICD-10-CM | POA: Diagnosis not present

## 2022-04-09 DIAGNOSIS — R21 Rash and other nonspecific skin eruption: Secondary | ICD-10-CM | POA: Diagnosis not present

## 2022-04-09 DIAGNOSIS — Z85828 Personal history of other malignant neoplasm of skin: Secondary | ICD-10-CM | POA: Diagnosis not present

## 2022-04-09 DIAGNOSIS — C3492 Malignant neoplasm of unspecified part of left bronchus or lung: Secondary | ICD-10-CM

## 2022-04-09 DIAGNOSIS — R7401 Elevation of levels of liver transaminase levels: Secondary | ICD-10-CM | POA: Diagnosis not present

## 2022-04-09 DIAGNOSIS — C3432 Malignant neoplasm of lower lobe, left bronchus or lung: Secondary | ICD-10-CM | POA: Diagnosis not present

## 2022-04-09 DIAGNOSIS — G4700 Insomnia, unspecified: Secondary | ICD-10-CM | POA: Insufficient documentation

## 2022-04-09 DIAGNOSIS — D696 Thrombocytopenia, unspecified: Secondary | ICD-10-CM | POA: Insufficient documentation

## 2022-04-09 DIAGNOSIS — Z87891 Personal history of nicotine dependence: Secondary | ICD-10-CM | POA: Diagnosis not present

## 2022-04-09 DIAGNOSIS — C7931 Secondary malignant neoplasm of brain: Secondary | ICD-10-CM | POA: Diagnosis not present

## 2022-04-09 DIAGNOSIS — Z808 Family history of malignant neoplasm of other organs or systems: Secondary | ICD-10-CM | POA: Diagnosis not present

## 2022-04-09 DIAGNOSIS — Z7901 Long term (current) use of anticoagulants: Secondary | ICD-10-CM | POA: Diagnosis not present

## 2022-04-09 DIAGNOSIS — R059 Cough, unspecified: Secondary | ICD-10-CM | POA: Insufficient documentation

## 2022-04-09 DIAGNOSIS — Z95828 Presence of other vascular implants and grafts: Secondary | ICD-10-CM

## 2022-04-09 DIAGNOSIS — Z801 Family history of malignant neoplasm of trachea, bronchus and lung: Secondary | ICD-10-CM | POA: Diagnosis not present

## 2022-04-09 LAB — CBC WITH DIFFERENTIAL/PLATELET
Abs Immature Granulocytes: 0.59 10*3/uL — ABNORMAL HIGH (ref 0.00–0.07)
Basophils Absolute: 0.1 10*3/uL (ref 0.0–0.1)
Basophils Relative: 1 %
Eosinophils Absolute: 0.1 10*3/uL (ref 0.0–0.5)
Eosinophils Relative: 1 %
HCT: 38.6 % — ABNORMAL LOW (ref 39.0–52.0)
Hemoglobin: 13.1 g/dL (ref 13.0–17.0)
Immature Granulocytes: 7 %
Lymphocytes Relative: 16 %
Lymphs Abs: 1.3 10*3/uL (ref 0.7–4.0)
MCH: 32.2 pg (ref 26.0–34.0)
MCHC: 33.9 g/dL (ref 30.0–36.0)
MCV: 94.8 fL (ref 80.0–100.0)
Monocytes Absolute: 0.8 10*3/uL (ref 0.1–1.0)
Monocytes Relative: 10 %
Neutro Abs: 5.2 10*3/uL (ref 1.7–7.7)
Neutrophils Relative %: 65 %
Platelets: 284 10*3/uL (ref 150–400)
RBC: 4.07 MIL/uL — ABNORMAL LOW (ref 4.22–5.81)
RDW: 15.3 % (ref 11.5–15.5)
Smear Review: NORMAL
WBC: 8 10*3/uL (ref 4.0–10.5)
nRBC: 0 % (ref 0.0–0.2)

## 2022-04-09 LAB — FOLATE: Folate: 28 ng/mL (ref >5.9)

## 2022-04-09 LAB — COMPREHENSIVE METABOLIC PANEL
ALT: 27 U/L (ref 0–44)
AST: 33 U/L (ref 15–41)
Albumin: 3.1 g/dL — ABNORMAL LOW (ref 3.5–5.0)
Alkaline Phosphatase: 153 U/L — ABNORMAL HIGH (ref 38–126)
Anion gap: 7 (ref 5–15)
BUN: 22 mg/dL (ref 8–23)
CO2: 24 mmol/L (ref 22–32)
Calcium: 8 mg/dL — ABNORMAL LOW (ref 8.9–10.3)
Chloride: 100 mmol/L (ref 98–111)
Creatinine, Ser: 1.04 mg/dL (ref 0.61–1.24)
GFR, Estimated: >60 mL/min (ref >60)
Glucose, Bld: 152 mg/dL — ABNORMAL HIGH (ref 70–99)
Potassium: 3.9 mmol/L (ref 3.5–5.1)
Sodium: 131 mmol/L — ABNORMAL LOW (ref 135–145)
Total Bilirubin: 0.9 mg/dL (ref 0.3–1.2)
Total Protein: 6.1 g/dL — ABNORMAL LOW (ref 6.5–8.1)

## 2022-04-09 LAB — VITAMIN B12: Vitamin B-12: 592 pg/mL (ref 180–914)

## 2022-04-09 MED ORDER — LIDOCAINE-PRILOCAINE 2.5-2.5 % EX CREA: 1.0000 | TOPICAL_CREAM | CUTANEOUS | 1 refills | Status: DC | PRN

## 2022-04-09 MED ORDER — SODIUM CHLORIDE 0.9% FLUSH
10.0000 mL | Freq: Once | INTRAVENOUS | Status: AC
  Administered 2022-04-09: 10 mL via INTRAVENOUS
  Filled 2022-04-09: qty 10

## 2022-04-09 MED ORDER — HEPARIN SOD (PORK) LOCK FLUSH 100 UNIT/ML IV SOLN
500.0000 [IU] | Freq: Once | INTRAVENOUS | Status: AC
  Administered 2022-04-09: 500 [IU] via INTRAVENOUS
  Filled 2022-04-09: qty 5

## 2022-04-09 NOTE — Telephone Encounter (Signed)
-----   Message from Secundino Ginger sent at 04/09/2022 11:32 AM EDT ----- Regarding: Warren started - [facility] sent to chart.

## 2022-04-09 NOTE — Assessment & Plan Note (Addendum)
#  Stage IV- -non-small cell lung cancer-[favor adenocarcinoma] [at dx-stage III- s/p  concurrent chemoradiation; s/p  adjuvant durvalumab; finished Feb 2023]; with metachronous brain metastases [March 2023];  Patient CT scan February 2023 chest and pelvis-no evidence of any progressive disease.  We will plan imaging today/ in 4-5 weeks.  CT scan ordered today.  # Brain metastases-March 2023-MRI-brain 2.1 cm s/p SBRT [March, 23rrd-2023]  MAY 2023-MRI: Small decrease in size of treated right frontal metastasis ;Increased surrounding edema is likely related to treatment.  Currently status post steroids improved.  Repeat MRI brain/MRI ordered.  Will consider Avastin if neurologically worse.  # Mild intermittent elevation of of Alk phos/mild AST ALT elevation-? From [facility]- monitor for now-  STABLE.   # insomnia- Bil LE swelling: sec to steroids-Tapered off today.  #Thrombocytopenia mild new 83 platelets;- today improved; monitor closely.    # A. fib on Eliquis [Dr.Kowalski]-  Mild swelling in legs- EF in Feb 2022- 55-60%-   # Right hip fracture status post ORIF.  Nonpathologic.  Awaiting physical therapy.  #Difficulty with urination: Status post evaluation with urology no PVR; discontinued Flomax.  Recommended use of Gemtesa-  # MediPort flush q 6 W-stable.   #  DISPOSITION: # follow up in 5 weeks MD; labs- cbc/cmp; port flush ;MRI Brain; CT chest  prior--Dr.B

## 2022-04-09 NOTE — Progress Notes (Signed)
[facility] NOTE  Patient Care Team: Baxter Hire, MD as PCP - General (Internal Medicine) [name], RN as Oncology Nurse Navigator [name], MD as Consulting Physician (Hematology and Oncology)  CHIEF COMPLAINTS/PURPOSE OF CONSULTATION: Lung cancer  #  Oncology History Overview Note  # NOV 2021- LEFT LOWER LOBE NON-SMALL CELL CA [favor adeno ca]; T2N3 [right hilar; subcarinal LN;[name]; NOV 2021-MRI [facility]  # 11/30- carbo-Taxol-RT [RT until 10/23/20]; s/p IMFINZI on 2/24. [97 months]; finished February 2023.  # #Brain metastases-March 2023-MRI-brain 2.1 cm peripherally enhancing lesion in the right paracentral lobule concerning for metastatic disease. There is mild perilesional edema and regional mass effect but no midline shift. On dexamethasone 2 mg a day.  Noted to have significant movement of the left lower extremity weakness.  Plan with SBRT on 3/22  # Right lower lobe - ~4.6 x 3.1 cm  [PET 07-2020]- demonstrates no significant hypermetabolic activity (SUV max 1.8]-benign. STABLE.     # A.fib [Eliquis; Dr.Kowalski]  # # SURVIVORSHIP:   # GENETICS:   #NGS-ordered  DIAGNOSIS: Lung cancer  STAGE:   III      ;  GOALS:  cure  CURRENT/MOST RECENT THERAPY : carbo-Taxol-RT ]   Stage 4 lung cancer, left ([place])  08/14/2020 Initial Diagnosis   Cancer of lower lobe of left lung ([place])   09/02/2020 - 10/22/2020 Chemotherapy    Patient is on Treatment Plan: LUNG DURVALUMAB Q14D      10/16/2020 Cancer Staging   Staging form: Lung, AJCC 8th Edition - Clinical: Stage IIIB (cT2a, cN3, cM0) - Signed by [name], MD on 10/16/2020   12/17/2020 -  Chemotherapy   Patient is on Treatment Plan : LUNG Durvalumab q14d      HISTORY OF PRESENTING ILLNESS: Patient in wheelchair.  He is accompanied by his wife/daughter.  Jason Warren 88 y.o.  male patient with currently stage IV [metachronus stage III lung non-small cell  lung; s/p treatment feb 2023 with Brain mets status post [facility] 2023 is here for a follow up.   Patient was started on steroids at last visit given his worsening left lower extremity weakness.  He has tapered the steroids off over 3 weeks.  The last dose being today.  Complains of worsening swelling in the bilateral feet/ankles. Does complain of difficulty sleeping.  No bleeding.  Mild cough mild fatigue not significantly worse.   Review of Systems  Constitutional:  Negative for chills, diaphoresis, fever, malaise/fatigue and weight loss.  HENT:  Negative for nosebleeds and sore throat.   Eyes:  Negative for double vision.  Respiratory:  Negative for hemoptysis, sputum production, shortness of breath and wheezing.   Cardiovascular:  Negative for chest pain, palpitations, orthopnea and leg swelling.  Gastrointestinal:  Negative for abdominal pain, blood in stool, constipation, diarrhea, heartburn, melena, nausea and vomiting.  Genitourinary:  Positive for frequency. Negative for dysuria and urgency.  Musculoskeletal:  Negative for back pain and joint pain.  Skin:  Negative for itching.  Neurological:  Positive for focal weakness. Negative for tingling, weakness and headaches.  Endo/Heme/Allergies:  Does not bruise/bleed easily.  Psychiatric/Behavioral:  Negative for depression. The patient is not nervous/anxious and does not have insomnia.      MEDICAL HISTORY:  Past Medical History:  Diagnosis Date   Arthritis    Dysrhythmia    A-fib   Hip pain    left   HOH (hard of hearing)    LBBB (left bundle branch  block) 08/05/2020   Lung cancer ([place])    Skin cancer, basal cell     face top of head    SURGICAL HISTORY: Past Surgical History:  Procedure Laterality Date   CATARACT EXTRACTION W/PHACO Right 07/13/2017   Procedure: CATARACT EXTRACTION PHACO AND INTRAOCULAR LENS PLACEMENT (IOC);  Surgeon: Leandrew Koyanagi, MD;  Location: [facility];  Service: Ophthalmology;   Laterality: Right;  IVA TOPICAL RIGHT   CATARACT EXTRACTION W/PHACO Left 01/04/2018   Procedure: CATARACT EXTRACTION PHACO AND INTRAOCULAR LENS PLACEMENT ([place]) LEFT;  Surgeon: Leandrew Koyanagi, MD;  Location: [facility];  Service: Ophthalmology;  Laterality: Left;   COLONOSCOPY     FEMUR FRACTURE SURGERY Right 01/03/2022   INTRAMEDULLARY (IM) NAIL INTERTROCHANTERIC Right 01/03/2022   Procedure: INTRAMEDULLARY (IM) NAIL INTERTROCHANTRIC;  Surgeon: Corky Mull, MD;  Location: [facility];  Service: Orthopedics;  Laterality: Right;   IR IMAGING GUIDED PORT INSERTION  08/27/2020   JOINT REPLACEMENT     Left total hip Dr. Su Hoff 08-04-18   ROTATOR CUFF REPAIR Right    SKIN CANCER EXCISION     face   TOTAL HIP ARTHROPLASTY Left 08/04/2018   Procedure: TOTAL HIP ARTHROPLASTY ANTERIOR APPROACH;  Surgeon: Frederik Pear, MD;  Location: [facility];  Service: Orthopedics;  Laterality: Left;   VIDEO BRONCHOSCOPY WITH ENDOBRONCHIAL NAVIGATION N/A 08/07/2020   Procedure: VIDEO BRONCHOSCOPY WITH ENDOBRONCHIAL NAVIGATION;  Surgeon: Ottie Glazier, MD;  Location: [facility];  Service: Thoracic;  Laterality: N/A;   VIDEO BRONCHOSCOPY WITH ENDOBRONCHIAL ULTRASOUND N/A 08/07/2020   Procedure: VIDEO BRONCHOSCOPY WITH ENDOBRONCHIAL ULTRASOUND;  Surgeon: Ottie Glazier, MD;  Location: [facility];  Service: Thoracic;  Laterality: N/A;    SOCIAL HISTORY: Social History   Socioeconomic History   Marital status: Married    Spouse name: Not on file   Number of children: Not on file   Years of education: Not on file   Highest education level: Not on file  Occupational History   Not on file  Tobacco Use   Smoking status: Former    Packs/day: 1.00    Years: 4.00    Total pack years: 4.00    Types: Cigarettes   Smokeless tobacco: Never   Tobacco comments:    quit early 70's  Vaping Use   Vaping Use: Never used  Substance and Sexual Activity   Alcohol use: No   Drug use: No   Sexual activity:  Not Currently  Other Topics Concern   Not on file  Social History Narrative   > quit 35 years; smoked for 15 years. Rare alcohol. 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    Social Determinants of Health   Financial Resource Strain: Not on file  Food Insecurity: Not on file  Transportation Needs: Not on file  Physical Activity: Not on file  Stress: Not on file  Social Connections: Not on file  Intimate Partner Violence: Not on file    FAMILY HISTORY: Family History  Problem Relation Age of Onset   Throat cancer Brother         & lung cancer    ALLERGIES:  has No Known Allergies.  MEDICATIONS:  Current Outpatient Medications  Medication Sig Dispense Refill   acetaminophen (TYLENOL) 500 MG tablet Take 1 tablet (500 mg total) by mouth every 6 (six) hours as needed. 30 tablet 0   apixaban (ELIQUIS) 5 MG TABS tablet Take 5 mg by mouth 2 (  two) times daily.     dexamethasone (DECADRON) 1 MG tablet Take 1 pill a day for the next 2 weeks; and then take half a pill a day-do not stop until next visit.  Take it in the morning; after breakfast. 30 tablet 0   dexamethasone (DECADRON) 4 MG tablet Take 2 pills in AM; and 1 pill afternoon x1 week; and the 1 pill in AM; and 1 pill at noon x 1 weeks; and then half pill twice a day x 1 week-- Do NOT STOP [Take with food] 60 tablet 0   docusate sodium (COLACE) 100 MG capsule Take 1 capsule (100 mg total) by mouth 2 (two) times daily. 10 capsule 0   hydroxypropyl methylcellulose / hypromellose (ISOPTO TEARS / GONIOVISC) 2.5 % ophthalmic solution Place 1 drop into the left eye daily as needed for dry eyes.     meloxicam (MOBIC) 15 MG tablet Take 15 mg by mouth daily.     Multiple Vitamin (MULTIVITAMIN) tablet Take 1 tablet by mouth daily.     neomycin-bacitracin-polymyxin (NEOSPORIN) ointment Apply topically daily. 15 g 0   pravastatin (PRAVACHOL) 80 MG tablet Take 80 mg by mouth at  bedtime.      saw palmetto 160 MG capsule Take 160 mg by mouth 2 (two) times daily.     tamsulosin (FLOMAX) 0.4 MG CAPS capsule Take 1 capsule (0.4 mg total) by mouth daily after supper. 30 capsule 6   Vibegron (GEMTESA) 75 MG TABS Take 75 mg by mouth daily. 30 tablet 0   lidocaine-prilocaine (EMLA) cream Apply 1 Application topically as needed. Apply small amount to port site at least 1 hour prior to it being accessed, cover with plastic wrap 30 g 1   No current facility-administered medications for this visit.   Facility-Administered Medications Ordered in Other Visits  Medication Dose Route Frequency Provider Last Rate Last Admin   heparin lock flush 100 UNIT/ML injection            sodium chloride flush (NS) 0.9 % injection 10 mL  10 mL Intravenous PRN [name], MD   10 mL at 02/11/21 0835   PHYSICAL EXAMINATION: ECOG PERFORMANCE STATUS: 1 - Symptomatic but completely ambulatory  Vitals:   04/09/22 0942  BP: 117/72  Pulse: 79  Temp: (!) 96.8 F (36 C)  SpO2: 98%   Filed Weights   04/09/22 0942  Weight: 162 lb 4.8 oz (73.6 kg)    Physical Exam HENT:     Head: Normocephalic and atraumatic.     Mouth/Throat:     Pharynx: No oropharyngeal exudate.  Eyes:     Pupils: Pupils are equal, round, and reactive to light.  Cardiovascular:     Rate and Rhythm: Normal rate and regular rhythm.  Pulmonary:     Effort: Pulmonary effort is normal. No respiratory distress.     Breath sounds: No wheezing.     Comments: Slightly decreased breath sounds left lower lung base. Abdominal:     General: Bowel sounds are normal. There is no distension.     Palpations: Abdomen is soft. There is no mass.     Tenderness: There is no abdominal tenderness. There is no guarding or rebound.  Musculoskeletal:        General: No tenderness. Normal range of motion.     Cervical back: Normal range of motion and neck supple.  Skin:    General: Skin is warm.  Neurological:     Mental  Status: He is  alert and oriented to person, place, and time.  Psychiatric:        Mood and Affect: Affect normal.      LABORATORY DATA:  I have reviewed the data as listed Lab Results  Component Value Date   WBC 8.0 04/09/2022   HGB 13.1 04/09/2022   HCT 38.6 (L) 04/09/2022   MCV 94.8 04/09/2022   PLT 284 04/09/2022   Recent Labs    02/26/22 0920 03/19/22 1018 04/09/22 0921  NA 137 131* 131*  K 3.8 4.5 3.9  CL 103 98 100  CO2 28 26 24   GLUCOSE 99 123* 152*  BUN 22 22 22   CREATININE 0.94 0.77 1.04  CALCIUM 8.5* 7.9* 8.0*  GFRNONAA >60 >60 >60  PROT 6.5 5.8* 6.1*  ALBUMIN 3.7 3.2* 3.1*  AST 27 42* 33  ALT 21 64* 27  ALKPHOS 130* 117 153*  BILITOT 0.5 1.1 0.9    RADIOGRAPHIC STUDIES: I have personally reviewed the radiological images as listed and agreed with the findings in the report. No results found.   ASSESSMENT & PLAN:   Stage 4 lung cancer, left ([place]) #Stage IV- -non-small cell lung cancer-[favor adenocarcinoma] [at dx-stage III- s/p  concurrent chemoradiation; s/p  adjuvant durvalumab; finished Feb 2023]; with metachronous brain metastases [March 2023];  Patient CT scan February 2023 chest and pelvis-no evidence of any progressive disease.  We will plan imaging today/ in 4-5 weeks.  CT scan ordered today.  # Brain metastases-March 2023-MRI-brain 2.1 cm s/p SBRT [March, 23rrd-2023]  MAY 2023-MRI: Small decrease in size of treated right frontal metastasis ;Increased surrounding edema is likely related to treatment.  Currently status post steroids improved.  Repeat MRI brain/MRI ordered.  Will consider Avastin if neurologically worse.  # Mild intermittent elevation of of Alk phos/mild AST ALT elevation-? From [facility]- monitor for now-  STABLE.   # insomnia- Bil LE swelling: sec to steroids-Tapered off today.  #Thrombocytopenia mild new 83 platelets;- today improved; monitor closely.    # A. fib on Eliquis [Dr.Kowalski]-  Mild swelling in legs- EF in Feb  2022- 55-60%-   # Right hip fracture status post ORIF.  Nonpathologic.  Awaiting physical therapy.  #Difficulty with urination: Status post evaluation with urology no PVR; discontinued Flomax.  Recommended use of Gemtesa-  # MediPort flush q 6 W-stable.   #  DISPOSITION: # follow up in 5 weeks MD; labs- cbc/cmp; port flush ;MRI Brain; CT chest  prior--Dr.B        All questions were answered. The patient knows to call the clinic with any problems, questions or concerns.   [name], MD 04/09/2022 4:54 PM

## 2022-04-09 NOTE — Progress Notes (Signed)
C/o lt achillies tendon, b/l feet and ankle swelling.  Coughing more often than before. Feels like esophagus is irritated.  Refill emla.

## 2022-04-09 NOTE — Telephone Encounter (Signed)
Prior auth submitted thru cover my meds.

## 2022-04-12 ENCOUNTER — Ambulatory Visit
Admission: RE | Admit: 2022-04-12 | Discharge: 2022-04-12 | Disposition: A | Discharge status: Home / Self Care | Payer: Medicare Other | Source: Ambulatory Visit | Attending: Radiation Oncology | Admitting: Radiation Oncology

## 2022-04-12 VITALS — BP 112/68 | HR 79 | Temp 97.0°F | Resp 18 | Ht 72.0 in

## 2022-04-12 DIAGNOSIS — C7931 Secondary malignant neoplasm of brain: Secondary | ICD-10-CM | POA: Insufficient documentation

## 2022-04-12 DIAGNOSIS — C3432 Malignant neoplasm of lower lobe, left bronchus or lung: Secondary | ICD-10-CM | POA: Insufficient documentation

## 2022-04-12 DIAGNOSIS — Z923 Personal history of irradiation: Secondary | ICD-10-CM | POA: Diagnosis not present

## 2022-04-12 NOTE — Telephone Encounter (Signed)
PA approved.

## 2022-04-12 NOTE — Progress Notes (Signed)
Radiation Oncology Follow up Note  Name: Jason Warren   Date:   04/12/2022 MRN:  [id number] DOB: Dec 15, 1928    This 88 y.o. male presents to the clinic today for 30-month follow-up status post SRS for solitary brain metastasis and patient previously treated for stage IIIb adenocarcinoma of the lung.  REFERRING PROVIDER: Baxter Hire, MD  HPI: Patient is a 88 year old male now out 4 months having completed SRS for solitary brain metastasis and patient previously treated with concurrent chemotherapy and radiation for stage IIIb adenocarcinoma the lung.Marland Kitchen  His last CT scan of the chest showed stable left perihilar postradiation changes no evidence of recurrent disease.  He had a recent MRI scan showing small decrease in the size of the treated right frontal metastasis with some surrounding edema likely related to treatment.  He is now off steroids.  He feels somewhat weak although most likely related to withdrawal from his Decadron.  He specifically denies any change in visual fields or any focal neurologic deficits.  He is scheduled for another CT scan of the chest in August.  He is having some insomnia and tried some Tylenol PM which worked I suggested him to starting that on a daily basis prior to sleep  COMPLICATIONS OF TREATMENT: none  FOLLOW UP COMPLIANCE: keeps appointments   PHYSICAL EXAM:  BP 112/68   Pulse 79   Temp (!) 97 F (36.1 C)   Resp 18   Ht 6' (1.829 m)   BMI 22.01 kg/m crude visual fields within normal range motor or sensory in detail levels are equal symmetric in upper lower extremities.  Proprioception appears intact. Well-developed well-nourished patient in NAD. HEENT reveals PERLA, EOMI, discs not visualized.  Oral cavity is clear. No oral mucosal lesions are identified. Neck is clear without evidence of cervical or supraclavicular adenopathy. Lungs are clear to A&P. Cardiac examination is essentially unremarkable with regular rate and rhythm without murmur rub or  thrill. Abdomen is benign with no organomegaly or masses noted. Motor sensory and DTR levels are equal and symmetric in the upper and lower extremities. Cranial nerves II through XII are grossly intact. Proprioception is intact. No peripheral adenopathy or edema is identified. No motor or sensory levels are noted. Crude visual fields are within normal range.  RADIOLOGY RESULTS: Brain MRI and CT scan of the chest reviewed compatible with above-stated findings  PLAN: Present time patient is stable reviewed his MRI scan showing good resolution of the mass with probable necrotic area presently.  He continues close follow-up care with medical oncology.  I have asked to see him back in 4 to 5 months for follow-up.  Family knows to call with any concerns.  I would like to take this opportunity to thank you for allowing me to participate in the care of your patient.Noreene Filbert, MD

## 2022-04-21 ENCOUNTER — Telehealth: Payer: Self-pay | Admitting: *Deleted

## 2022-04-21 ENCOUNTER — Inpatient Hospital Stay (HOSPITAL_BASED_OUTPATIENT_CLINIC_OR_DEPARTMENT_OTHER): Payer: Medicare Other | Admitting: Hospice and Palliative Medicine

## 2022-04-21 ENCOUNTER — Encounter: Payer: Self-pay | Admitting: Internal Medicine

## 2022-04-21 ENCOUNTER — Encounter: Payer: Self-pay | Admitting: Hospice and Palliative Medicine

## 2022-04-21 VITALS — BP 128/66 | HR 89 | Temp 97.2°F | Resp 16 | Ht 72.0 in | Wt 163.4 lb

## 2022-04-21 DIAGNOSIS — C3432 Malignant neoplasm of lower lobe, left bronchus or lung: Secondary | ICD-10-CM | POA: Diagnosis not present

## 2022-04-21 DIAGNOSIS — L989 Disorder of the skin and subcutaneous tissue, unspecified: Secondary | ICD-10-CM | POA: Diagnosis not present

## 2022-04-21 MED ORDER — SULFAMETHOXAZOLE-TRIMETHOPRIM 800-160 MG PO TABS: 1.0000 | ORAL_TABLET | Freq: Two times a day (BID) | ORAL | 0 refills | Status: DC

## 2022-04-21 NOTE — Telephone Encounter (Signed)
RN spoke with daughter, [name]. She fwd the pics of the wounds to mychart.  Apt made for this afternoon at 145 to see Josh, NP to evaluate skins lesions/wound.

## 2022-04-21 NOTE — Telephone Encounter (Signed)
Attempted to reach pt's wife. No answer. Vm left. Patient needs to evaluated in [facility]

## 2022-04-21 NOTE — Progress Notes (Signed)
Symptom Management [place] at [facility] Telephone:(336) [phone] [phone]  Patient Care Team: Baxter Hire, MD as PCP - General (Internal Medicine) [name], RN as Oncology Nurse Navigator [name], MD as Consulting Physician (Hematology and Oncology)   Name of the patient: Jason Warren  [id number]  [birth year]   Date of visit: 04/21/22  Reason for Consult: Jason Warren is a 88 y.o. male with multiple medical problems including stage IV non-small cell lung cancer originally diagnosed November 2021 status post CarboTaxol RT.  Status post Imfinzi for 12 months.  Brain metastasis in March 2023 status post SBRT.  Patient presents to [facility] today for evaluation of skin eruption.  Patient reports 2 to 3 weeks of erythematous skin lesions starting on his legs but now involving arms as well.  He says a started as small boils but have no drainage.  They do not itch or hurt.  He denies any previous rashes or skin infections.  No changes in medications.  No fever or chills or other symptomatic complaints.  Denies any neurologic complaints. Denies recent fevers or illnesses. Denies any easy bleeding or bruising. Reports fair appetite and denies weight loss. Denies chest pain. Denies any nausea, vomiting, constipation, or diarrhea. Patient offers no further specific complaints today.  PAST MEDICAL HISTORY: Past Medical History:  Diagnosis Date   Arthritis    Dysrhythmia    A-fib   Hip pain    left   HOH (hard of hearing)    LBBB (left bundle branch block) 08/05/2020   Lung cancer ([place])    Skin cancer, basal cell     face top of head    PAST SURGICAL HISTORY:  Past Surgical History:  Procedure Laterality Date   CATARACT EXTRACTION W/PHACO Right 07/13/2017   Procedure: CATARACT EXTRACTION PHACO AND INTRAOCULAR LENS PLACEMENT ([place]);  Surgeon: Leandrew Koyanagi, MD;  Location: [place];  Service: Ophthalmology;   Laterality: Right;  IVA TOPICAL RIGHT   CATARACT EXTRACTION W/PHACO Left 01/04/2018   Procedure: CATARACT EXTRACTION PHACO AND INTRAOCULAR LENS PLACEMENT ([place]) LEFT;  Surgeon: Leandrew Koyanagi, MD;  Location: [place];  Service: Ophthalmology;  Laterality: Left;   COLONOSCOPY     FEMUR FRACTURE SURGERY Right 01/03/2022   INTRAMEDULLARY (IM) NAIL INTERTROCHANTERIC Right 01/03/2022   Procedure: INTRAMEDULLARY (IM) NAIL INTERTROCHANTRIC;  Surgeon: Corky Mull, MD;  Location: [facility];  Service: Orthopedics;  Laterality: Right;   IR IMAGING GUIDED PORT INSERTION  08/27/2020   JOINT REPLACEMENT     Left total hip Dr. Su Hoff 08-04-18   ROTATOR CUFF REPAIR Right    SKIN CANCER EXCISION     face   TOTAL HIP ARTHROPLASTY Left 08/04/2018   Procedure: TOTAL HIP ARTHROPLASTY ANTERIOR APPROACH;  Surgeon: Frederik Pear, MD;  [facility];  Service: Orthopedics;  Laterality: Left;   VIDEO BRONCHOSCOPY WITH ENDOBRONCHIAL NAVIGATION N/A 08/07/2020   Procedure: VIDEO BRONCHOSCOPY WITH ENDOBRONCHIAL NAVIGATION;  Surgeon: Ottie Glazier, MD;  Location: [facility];  Service: Thoracic;  Laterality: N/A;   VIDEO BRONCHOSCOPY WITH ENDOBRONCHIAL ULTRASOUND N/A 08/07/2020   Procedure: VIDEO BRONCHOSCOPY WITH ENDOBRONCHIAL ULTRASOUND;  Surgeon: Ottie Glazier, MD;  Location: [facility];  Service: Thoracic;  Laterality: N/A;    HEMATOLOGY/ONCOLOGY HISTORY:  Oncology History Overview Note  # NOV 2021- LEFT LOWER LOBE NON-SMALL CELL CA [favor adeno ca]; T2N3 [right hilar; subcarinal LN;[name]; NOV 2021-MRI [place]  # 11/30- carbo-Taxol-RT [RT until 10/23/20]; s/p IMFINZI on 2/24. [53 months]; finished February  2023.  # #Brain metastases-March 2023-MRI-brain 2.1 cm peripherally enhancing lesion in the right paracentral lobule concerning for metastatic disease. There is mild perilesional edema and regional mass effect but no midline shift. On dexamethasone 2 mg a day.  Noted to have  significant movement of the left lower extremity weakness.  Plan with SBRT on 3/22  # Right lower lobe - ~4.6 x 3.1 cm  [PET 07-2020]- demonstrates no significant hypermetabolic activity (SUV max 1.8]-benign. STABLE.     # A.fib [Eliquis; Dr.Kowalski]  # # SURVIVORSHIP:   # GENETICS:   #NGS-ordered  DIAGNOSIS: Lung cancer  STAGE:   III      ;  GOALS:  cure  CURRENT/MOST RECENT THERAPY : carbo-Taxol-RT ]   Stage 4 lung cancer, left ([place])  08/14/2020 Initial Diagnosis   Cancer of lower lobe of left lung ([place])   09/02/2020 - 10/22/2020 Chemotherapy    Patient is on Treatment Plan: LUNG DURVALUMAB Q14D      10/16/2020 Cancer Staging   Staging form: Lung, AJCC 8th Edition - Clinical: Stage IIIB (cT2a, cN3, cM0) - Signed by [name], MD on 10/16/2020   12/17/2020 -  Chemotherapy   Patient is on Treatment Plan : LUNG Durvalumab q14d       ALLERGIES:  has No Known Allergies.  MEDICATIONS:  Current Outpatient Medications  Medication Sig Dispense Refill   acetaminophen (TYLENOL) 500 MG tablet Take 1 tablet (500 mg total) by mouth every 6 (six) hours as needed. 30 tablet 0   apixaban (ELIQUIS) 5 MG TABS tablet Take 5 mg by mouth 2 (two) times daily.     hydroxypropyl methylcellulose / hypromellose (ISOPTO TEARS / GONIOVISC) 2.5 % ophthalmic solution Place 1 drop into the left eye daily as needed for dry eyes.     lidocaine-prilocaine (EMLA) cream Apply 1 Application topically as needed. Apply small amount to port site at least 1 hour prior to it being accessed, cover with plastic wrap 30 g 1   Multiple Vitamin (MULTIVITAMIN) tablet Take 1 tablet by mouth daily.     pravastatin (PRAVACHOL) 80 MG tablet Take 80 mg by mouth at bedtime.      saw palmetto 160 MG capsule Take 160 mg by mouth 2 (two) times daily.     sulfamethoxazole-trimethoprim (BACTRIM DS) 800-160 MG tablet Take 1 tablet by mouth 2 (two) times daily. 20 tablet 0   meloxicam (MOBIC) 15 MG tablet Take 15  mg by mouth daily. (Patient not taking: Reported on 04/21/2022)     Vibegron (GEMTESA) 75 MG TABS Take 75 mg by mouth daily. (Patient not taking: Reported on 04/21/2022) 30 tablet 0   No current facility-administered medications for this visit.   Facility-Administered Medications Ordered in Other Visits  Medication Dose Route Frequency Provider Last Rate Last Admin   heparin lock flush 100 UNIT/ML injection            sodium chloride flush (NS) 0.9 % injection 10 mL  10 mL Intravenous PRN Charlaine Dalton R, MD   10 mL at 02/11/21 0835    VITAL SIGNS: BP 128/66   Pulse 89   Temp (!) 97.2 F (36.2 C) (Oral)   Resp 16   Ht 6' (1.829 m)   Wt 163 lb 6.4 oz (74.1 kg)   SpO2 98%   BMI 22.16 kg/m  Filed Weights   04/21/22 1345  Weight: 163 lb 6.4 oz (74.1 kg)    Estimated body mass index is 22.16 kg/m as calculated  from the following:   Height as of this encounter: 6' (1.829 m).   Weight as of this encounter: 163 lb 6.4 oz (74.1 kg).  LABS: CBC:    Component Value Date/Time   WBC 8.0 04/09/2022 0921   HGB 13.1 04/09/2022 0921   HCT 38.6 (L) 04/09/2022 0921   PLT 284 04/09/2022 0921   MCV 94.8 04/09/2022 0921   NEUTROABS 5.2 04/09/2022 0921   LYMPHSABS 1.3 04/09/2022 0921   MONOABS 0.8 04/09/2022 0921   EOSABS 0.1 04/09/2022 0921   BASOSABS 0.1 04/09/2022 0921   Comprehensive Metabolic Panel:    Component Value Date/Time   NA 131 (L) 04/09/2022 0921   K 3.9 04/09/2022 0921   CL 100 04/09/2022 0921   CO2 24 04/09/2022 0921   BUN 22 04/09/2022 0921   CREATININE 1.04 04/09/2022 0921   GLUCOSE 152 (H) 04/09/2022 0921   CALCIUM 8.0 (L) 04/09/2022 0921   AST 33 04/09/2022 0921   ALT 27 04/09/2022 0921   ALKPHOS 153 (H) 04/09/2022 0921   BILITOT 0.9 04/09/2022 0921   PROT 6.1 (L) 04/09/2022 0921   ALBUMIN 3.1 (L) 04/09/2022 0921    RADIOGRAPHIC STUDIES: No results found.  PERFORMANCE STATUS (ECOG) : 2 - Symptomatic, <50% confined to bed  Review of  Systems Unless otherwise noted, a complete review of systems is negative.  Physical Exam General: NAD Cardiovascular: regular rate and rhythm Pulmonary: clear ant fields Abdomen: soft, nontender, + bowel sounds GU: no suprapubic tenderness Extremities: no edema, no joint deformities Skin: Multiple circular erythematous lesions the legs/arms             Assessment and Plan- Patient is a 88 y.o. male with multiple medical problems including stage III non-small cell lung cancer originally diagnosed November 2021 status post CarboTaxol RT.  Status post Imfinzi for 12 months.  Patient presents to [facility] for evaluation of skin eruptions   Skin lesions-discussed with Dr. Rogue Bussing and will start patient on Bactrim for staph/MRSA coverage.  We will bring patient back in 1 week for recheck.  Case and plan discussed with Dr. Rogue Bussing.  RTC next week   Patient expressed understanding and was in agreement with this plan. He also understands that He can call clinic at any time with any questions, concerns, or complaints.   Thank you for allowing me to participate in the care of this very pleasant patient.   Time Total: 20 minutes  Visit consisted of counseling and education dealing with the complex and emotionally intense issues of symptom management in the setting of serious illness.Greater than 50%  of this time was spent counseling and coordinating care related to the above assessment and plan.  Signed by: Altha Harm, PhD, NP-C

## 2022-04-21 NOTE — Telephone Encounter (Signed)
Patient wife Pamala Hurry called reporting that for the past 2 weeks patient has been developing lesions first on his left knee then the right now it is on the backs of his legs and the lesions are getting bigger, they are very red and blistery looking. They started out the size of a quarter but are spreading out bigger. She denies that he has been bitten by anything. She is going to try to take a picture and send it through My Chart for [place] to see. He will be at PT from 10 - 11, but she will be home if we will call her. She did not mention any itching of the areas

## 2022-04-26 ENCOUNTER — Other Ambulatory Visit: Payer: Self-pay

## 2022-04-28 ENCOUNTER — Inpatient Hospital Stay (HOSPITAL_BASED_OUTPATIENT_CLINIC_OR_DEPARTMENT_OTHER): Payer: Medicare Other | Admitting: Hospice and Palliative Medicine

## 2022-04-28 ENCOUNTER — Other Ambulatory Visit: Payer: Self-pay

## 2022-04-28 VITALS — BP 116/58 | HR 74 | Temp 97.2°F | Resp 20 | Ht 72.0 in | Wt 159.6 lb

## 2022-04-28 DIAGNOSIS — L989 Disorder of the skin and subcutaneous tissue, unspecified: Secondary | ICD-10-CM | POA: Diagnosis not present

## 2022-04-28 DIAGNOSIS — C3432 Malignant neoplasm of lower lobe, left bronchus or lung: Secondary | ICD-10-CM | POA: Diagnosis not present

## 2022-04-28 NOTE — Progress Notes (Signed)
Apt obtained at [facility] tomorrow at 930 am with Dr. Maudie Mercury

## 2022-04-28 NOTE — Patient Instructions (Signed)
Apt obtained at [facility] tomorrow at 930 am with Dr. Maudie Mercury

## 2022-04-28 NOTE — Progress Notes (Signed)
Symptom Management and [place] at [facility] Telephone:(336) [phone] Fax:([phone]  Patient Care Team: Baxter Hire, MD as PCP - General (Internal Medicine) [name], RN as Oncology Nurse Navigator [name], MD as Consulting Physician (Hematology and Oncology)   NAME OF PATIENT: Jason Warren  [id number]  10-02-1929   DATE OF VISIT: 04/28/22  REASON FOR CONSULT: Jason Warren is a 88 y.o. male with multiple medical problems including stage IV non-small cell lung cancer originally diagnosed November 2021 status post CarboTaxol RT.  Status post Imfinzi for 12 months.  Brain metastasis in March 2023 status post SBRT.Marland Kitchen   INTERVAL HISTORY: Patient was seen in [facility] on 04/21/2022 for evaluation of erythematous skin lesions on legs and arms.  He was started empirically on Bactrim for MRSA coverage.  Patient presents to clinic today for follow-up.  Patient is taken 7 days of Bactrim.  No significant change in lesions reported by patient or family.  No fever or chills.  Lesions are not draining.  They are somewhat tender to touch.  Denies any neurologic complaints. Denies recent fevers or illnesses. Denies any easy bleeding or bruising. Reports fair appetite and denies weight loss. Denies chest pain. Denies any nausea, vomiting, constipation, or diarrhea. Denies urinary complaints. Patient offers no further specific complaints today.  SOCIAL HISTORY:     reports that he has quit smoking. His smoking use included cigarettes. He has a 4.00 pack-year smoking history. He has never used smokeless tobacco. He reports that he does not drink alcohol and does not use drugs.  ADVANCE DIRECTIVES:    CODE STATUS:    PAST MEDICAL HISTORY: Past Medical History:  Diagnosis Date   Arthritis    Dysrhythmia    A-fib   Hip pain    left   HOH (hard of hearing)    LBBB (left bundle branch block) 08/05/2020   Lung cancer ([place])     Skin cancer, basal cell     face top of head    PAST SURGICAL HISTORY:  Past Surgical History:  Procedure Laterality Date   CATARACT EXTRACTION W/PHACO Right 07/13/2017   Procedure: CATARACT EXTRACTION PHACO AND INTRAOCULAR LENS PLACEMENT ([place]);  Surgeon: Leandrew Koyanagi, MD;  Location: [place];  Service: Ophthalmology;  Laterality: Right;  IVA TOPICAL RIGHT   CATARACT EXTRACTION W/PHACO Left 01/04/2018   Procedure: CATARACT EXTRACTION PHACO AND INTRAOCULAR LENS PLACEMENT ([place]) LEFT;  Surgeon: Leandrew Koyanagi, MD;  Location: [place];  Service: Ophthalmology;  Laterality: Left;   COLONOSCOPY     FEMUR FRACTURE SURGERY Right 01/03/2022   INTRAMEDULLARY (IM) NAIL INTERTROCHANTERIC Right 01/03/2022   Procedure: INTRAMEDULLARY (IM) NAIL INTERTROCHANTRIC;  Surgeon: Corky Mull, MD;  Location: [facility];  Service: Orthopedics;  Laterality: Right;   IR IMAGING GUIDED PORT INSERTION  08/27/2020   JOINT REPLACEMENT     Left total hip Dr. Su Hoff 08-04-18   ROTATOR CUFF REPAIR Right    SKIN CANCER EXCISION     face   TOTAL HIP ARTHROPLASTY Left 08/04/2018   Procedure: TOTAL HIP ARTHROPLASTY ANTERIOR APPROACH;  Surgeon: Frederik Pear, MD;  [facility];  Service: Orthopedics;  Laterality: Left;   VIDEO BRONCHOSCOPY WITH ENDOBRONCHIAL NAVIGATION N/A 08/07/2020   Procedure: VIDEO BRONCHOSCOPY WITH ENDOBRONCHIAL NAVIGATION;  Surgeon: Ottie Glazier, MD;  Location: [facility];  Service: Thoracic;  Laterality: N/A;   VIDEO BRONCHOSCOPY WITH ENDOBRONCHIAL ULTRASOUND N/A 08/07/2020   Procedure: VIDEO BRONCHOSCOPY WITH ENDOBRONCHIAL ULTRASOUND;  Surgeon:  Ottie Glazier, MD;  Location: [facility];  Service: Thoracic;  Laterality: N/A;    HEMATOLOGY/ONCOLOGY HISTORY:  Oncology History Overview Note  # NOV 2021- LEFT LOWER LOBE NON-SMALL CELL CA [favor adeno ca]; T2N3 [right hilar; subcarinal LN;[name]; NOV 2021-MRI [place]  # 11/30- carbo-Taxol-RT [RT until  10/23/20]; s/p IMFINZI on 2/24. [95 months]; finished February 2023.  # #Brain metastases-March 2023-MRI-brain 2.1 cm peripherally enhancing lesion in the right paracentral lobule concerning for metastatic disease. There is mild perilesional edema and regional mass effect but no midline shift. On dexamethasone 2 mg a day.  Noted to have significant movement of the left lower extremity weakness.  Plan with SBRT on 3/22  # Right lower lobe - ~4.6 x 3.1 cm  [PET 07-2020]- demonstrates no significant hypermetabolic activity (SUV max 1.8]-benign. STABLE.     # A.fib [Eliquis; Dr.Kowalski]  # # SURVIVORSHIP:   # GENETICS:   #NGS-ordered  DIAGNOSIS: Lung cancer  STAGE:   III      ;  GOALS:  cure  CURRENT/MOST RECENT THERAPY : carbo-Taxol-RT ]   Stage 4 lung cancer, left ([place])  08/14/2020 Initial Diagnosis   Cancer of lower lobe of left lung ([place])   09/02/2020 - 10/22/2020 Chemotherapy    Patient is on Treatment Plan: LUNG DURVALUMAB Q14D      10/16/2020 Cancer Staging   Staging form: Lung, AJCC 8th Edition - Clinical: Stage IIIB (cT2a, cN3, cM0) - Signed by [name], MD on 10/16/2020   12/17/2020 -  Chemotherapy   Patient is on Treatment Plan : LUNG Durvalumab q14d       ALLERGIES:  has No Known Allergies.  MEDICATIONS:  Current Outpatient Medications  Medication Sig Dispense Refill   acetaminophen (TYLENOL) 500 MG tablet Take 1 tablet (500 mg total) by mouth every 6 (six) hours as needed. 30 tablet 0   apixaban (ELIQUIS) 5 MG TABS tablet Take 5 mg by mouth 2 (two) times daily.     hydroxypropyl methylcellulose / hypromellose (ISOPTO TEARS / GONIOVISC) 2.5 % ophthalmic solution Place 1 drop into the left eye daily as needed for dry eyes.     lidocaine-prilocaine (EMLA) cream Apply 1 Application topically as needed. Apply small amount to port site at least 1 hour prior to it being accessed, cover with plastic wrap 30 g 1   meloxicam (MOBIC) 15 MG tablet Take 15 mg  by mouth daily. (Patient not taking: Reported on 04/21/2022)     Multiple Vitamin (MULTIVITAMIN) tablet Take 1 tablet by mouth daily.     pravastatin (PRAVACHOL) 80 MG tablet Take 80 mg by mouth at bedtime.      saw palmetto 160 MG capsule Take 160 mg by mouth 2 (two) times daily.     sulfamethoxazole-trimethoprim (BACTRIM DS) 800-160 MG tablet Take 1 tablet by mouth 2 (two) times daily. 20 tablet 0   Vibegron (GEMTESA) 75 MG TABS Take 75 mg by mouth daily. (Patient not taking: Reported on 04/21/2022) 30 tablet 0   No current facility-administered medications for this visit.   Facility-Administered Medications Ordered in Other Visits  Medication Dose Route Frequency Provider Last Rate Last Admin   heparin lock flush 100 UNIT/ML injection            sodium chloride flush (NS) 0.9 % injection 10 mL  10 mL Intravenous PRN [name], MD   10 mL at 02/11/21 0835    VITAL SIGNS: There were no vitals taken for this visit. There were no  vitals filed for this visit.  Estimated body mass index is 22.16 kg/m as calculated from the following:   Height as of 04/21/22: 6' (1.829 m).   Weight as of 04/21/22: 163 lb 6.4 oz (74.1 kg).  LABS: CBC:    Component Value Date/Time   WBC 8.0 04/09/2022 0921   HGB 13.1 04/09/2022 0921   HCT 38.6 (L) 04/09/2022 0921   PLT 284 04/09/2022 0921   MCV 94.8 04/09/2022 0921   NEUTROABS 5.2 04/09/2022 0921   LYMPHSABS 1.3 04/09/2022 0921   MONOABS 0.8 04/09/2022 0921   EOSABS 0.1 04/09/2022 0921   BASOSABS 0.1 04/09/2022 0921   Comprehensive Metabolic Panel:    Component Value Date/Time   NA 131 (L) 04/09/2022 0921   K 3.9 04/09/2022 0921   CL 100 04/09/2022 0921   CO2 24 04/09/2022 0921   BUN 22 04/09/2022 0921   CREATININE 1.04 04/09/2022 0921   GLUCOSE 152 (H) 04/09/2022 0921   CALCIUM 8.0 (L) 04/09/2022 0921   AST 33 04/09/2022 0921   ALT 27 04/09/2022 0921   ALKPHOS 153 (H) 04/09/2022 0921   BILITOT 0.9 04/09/2022 0921   PROT 6.1 (L)  04/09/2022 0921   ALBUMIN 3.1 (L) 04/09/2022 0921    RADIOGRAPHIC STUDIES: No results found.  PERFORMANCE STATUS (ECOG) : 2 - Symptomatic, <50% confined to bed  Review of Systems Unless otherwise noted, a complete review of systems is negative.  Physical Exam General: NAD Cardiovascular: regular rate and rhythm Pulmonary: clear ant fields Abdomen: soft, nontender, + bowel sounds GU: no suprapubic tenderness Extremities: no edema, no joint deformities Skin: Multiple raised, erythematous, nondraining lesions to legs/arms Neurological: Weakness but otherwise nonfocal           IMPRESSION/PLAN: Skin Lesions -no significant improvement on Bactrim.  Patient is established with Dr. Evorn Gong with dermatology and reports that he is scheduled to see him on 8/8.  We will call Dr. Roosvelt Harps office and see if patient can be seen sooner.  Consider skin biopsy in setting of stage IV lung cancer.  We will have patient continue and complete course of Bactrim.  Patient is pending CTs on 8/8.   RTC next week for follow-up  Patient expressed understanding and was in agreement with this plan. He also understands that He can call clinic at any time with any questions, concerns, or complaints.   Thank you for allowing me to participate in the care of this very pleasant patient.   Time Total: 15 minutes  Visit consisted of counseling and education dealing with the complex and emotionally intense issues of symptom management in the setting of serious illness.Greater than 50%  of this time was spent counseling and coordinating care related to the above assessment and plan.  Signed by: Altha Harm, PhD, NP-C

## 2022-05-03 ENCOUNTER — Telehealth: Payer: Self-pay | Admitting: *Deleted

## 2022-05-03 NOTE — Telephone Encounter (Signed)
Dr. Maudie Mercury from [facility] requested call from Dr. B to discuss patient, cell # is [phone].

## 2022-05-04 ENCOUNTER — Other Ambulatory Visit: Payer: Self-pay

## 2022-05-04 ENCOUNTER — Encounter: Payer: Self-pay | Admitting: Internal Medicine

## 2022-05-04 ENCOUNTER — Telehealth: Payer: Self-pay

## 2022-05-04 DIAGNOSIS — L989 Disorder of the skin and subcutaneous tissue, unspecified: Secondary | ICD-10-CM

## 2022-05-04 DIAGNOSIS — C3490 Malignant neoplasm of unspecified part of unspecified bronchus or lung: Secondary | ICD-10-CM

## 2022-05-04 DIAGNOSIS — C3492 Malignant neoplasm of unspecified part of left bronchus or lung: Secondary | ICD-10-CM

## 2022-05-04 NOTE — Telephone Encounter (Signed)
Per secure chat:  Dr. Jacinto ReapLeonia Warren is a patient with lung cancer with brain metastases-on bevacizumab.  Recently noted to have nodules on the skin-biopsy positive for atypical AFB. Just spoke to the pathologist Dr. Maudie Mercury.  I would appreciate if he can see him as outpatient at your earliest convenience. Thanks, [place].   Dr. Delaine Lame: sure, I can see him next Tuesday- if you want him to be seen earlier I can try to overbook for this Thursday ...can you fax the results to me- I dont see it in the chart? my fax number is [phone]. I would prefer to see the report first- so I can scheudle him for next Tuesday then.  Dr. B: I just spoke to the pathologist just 20 mins ago.  I was told the report will be faxed over to our office.  We will make the referral ASAP.re: atypical AFB skin infection. will do fax the results-once we have it available.. Thank again for your prompt help.   I will fax once we get the results.

## 2022-05-04 NOTE — Progress Notes (Signed)
I spoke to Dr. 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  Awaiting fax.  Discussed with Dr. Steva Ready; kindly agrees to evaluate the patient ASAP.  Thank you, GB

## 2022-05-05 ENCOUNTER — Encounter: Payer: Self-pay | Admitting: Internal Medicine

## 2022-05-05 ENCOUNTER — Inpatient Hospital Stay: Payer: Medicare Other | Attending: Internal Medicine | Admitting: Internal Medicine

## 2022-05-05 DIAGNOSIS — R748 Abnormal levels of other serum enzymes: Secondary | ICD-10-CM | POA: Insufficient documentation

## 2022-05-05 DIAGNOSIS — R229 Localized swelling, mass and lump, unspecified: Secondary | ICD-10-CM | POA: Insufficient documentation

## 2022-05-05 DIAGNOSIS — C7931 Secondary malignant neoplasm of brain: Secondary | ICD-10-CM | POA: Diagnosis present

## 2022-05-05 DIAGNOSIS — Z7901 Long term (current) use of anticoagulants: Secondary | ICD-10-CM | POA: Insufficient documentation

## 2022-05-05 DIAGNOSIS — Z808 Family history of malignant neoplasm of other organs or systems: Secondary | ICD-10-CM | POA: Insufficient documentation

## 2022-05-05 DIAGNOSIS — C3432 Malignant neoplasm of lower lobe, left bronchus or lung: Secondary | ICD-10-CM | POA: Diagnosis present

## 2022-05-05 DIAGNOSIS — M7989 Other specified soft tissue disorders: Secondary | ICD-10-CM | POA: Diagnosis not present

## 2022-05-05 DIAGNOSIS — C3492 Malignant neoplasm of unspecified part of left bronchus or lung: Secondary | ICD-10-CM | POA: Diagnosis not present

## 2022-05-05 DIAGNOSIS — Z85828 Personal history of other malignant neoplasm of skin: Secondary | ICD-10-CM | POA: Insufficient documentation

## 2022-05-05 DIAGNOSIS — Z801 Family history of malignant neoplasm of trachea, bronchus and lung: Secondary | ICD-10-CM | POA: Diagnosis not present

## 2022-05-05 DIAGNOSIS — Z87891 Personal history of nicotine dependence: Secondary | ICD-10-CM | POA: Insufficient documentation

## 2022-05-05 DIAGNOSIS — I4891 Unspecified atrial fibrillation: Secondary | ICD-10-CM | POA: Insufficient documentation

## 2022-05-05 DIAGNOSIS — D696 Thrombocytopenia, unspecified: Secondary | ICD-10-CM | POA: Diagnosis not present

## 2022-05-05 DIAGNOSIS — G4700 Insomnia, unspecified: Secondary | ICD-10-CM | POA: Insufficient documentation

## 2022-05-05 DIAGNOSIS — R350 Frequency of micturition: Secondary | ICD-10-CM | POA: Diagnosis not present

## 2022-05-05 NOTE — Telephone Encounter (Signed)
Referral and biopsy faxed.

## 2022-05-05 NOTE — Progress Notes (Signed)
Concerns with areas on his skin. Had biopsy.

## 2022-05-05 NOTE — Progress Notes (Signed)
[name]  Patient Care Team: Baxter Hire, MD as PCP - General (Internal Medicine) [name], RN as Oncology Nurse Navigator [name], MD as Consulting Physician (Hematology and Oncology) [name], RN as Registered Nurse (Oncology)  CHIEF COMPLAINTS/PURPOSE OF CONSULTATION: Lung cancer  #  Oncology History Overview Note  # NOV 2021- LEFT LOWER LOBE NON-SMALL CELL CA [favor adeno ca]; T2N3 [right hilar; subcarinal LN;[name]; NOV 2021-MRI [name]  # 11/30- carbo-Taxol-RT [RT until 10/23/20]; s/p IMFINZI on 2/24. [41 months]; finished February 2023.  # #Brain metastases-March 2023-MRI-brain 2.1 cm peripherally enhancing lesion in the right paracentral lobule concerning for metastatic disease. There is mild perilesional edema and regional mass effect but no midline shift. On dexamethasone 2 mg a day.  Noted to have significant movement of the left lower extremity weakness.  Plan with SBRT on 3/22  # Right lower lobe - ~4.6 x 3.1 cm  [PET 07-2020]- demonstrates no significant hypermetabolic activity (SUV max 1.8]-benign. STABLE.     # A.fib [Eliquis; Dr.Kowalski]  # # SURVIVORSHIP:   # GENETICS:   #NGS-ordered  DIAGNOSIS: Lung cancer  STAGE:   III      ;  GOALS:  cure  CURRENT/MOST RECENT THERAPY : carbo-Taxol-RT ]   Stage 4 lung cancer, left ([place])  08/14/2020 Initial Diagnosis   Cancer of lower lobe of left lung ([place])   09/02/2020 - 10/22/2020 Chemotherapy    Patient is on Treatment Plan: LUNG DURVALUMAB Q14D      10/16/2020 Cancer Staging   Staging form: Lung, AJCC 8th Edition - Clinical: Stage IIIB (cT2a, cN3, cM0) - Signed by [name], MD on 10/16/2020   12/17/2020 -  Chemotherapy   Patient is on Treatment Plan : LUNG Durvalumab q14d      HISTORY OF PRESENTING ILLNESS: Patient with a rolling walker.  He is accompanied by his wife/daughter.  Jason Warren 88 y.o.  male patient with  currently stage IV [metachronus stage III lung non-small cell lung; s/p treatment feb 2023 with Brain mets status post [facility] 2023 is here for a follow up.   In the interim patient was evaluated by symptom management clinic for his worsening bilateral upper lower extremity nodules on the skin.  S/p evaluation with dermatology.  S/p biopsy.  Patient has been currently off steroids.  Denies any worsening weakness in his leg.  Mild cough mild fatigue not significantly worse.   Review of Systems  Constitutional:  Negative for chills, diaphoresis, fever, malaise/fatigue and weight loss.  HENT:  Negative for nosebleeds and sore throat.   Eyes:  Negative for double vision.  Respiratory:  Negative for hemoptysis, sputum production, shortness of breath and wheezing.   Cardiovascular:  Negative for chest pain, palpitations, orthopnea and leg swelling.  Gastrointestinal:  Negative for abdominal pain, blood in stool, constipation, diarrhea, heartburn, melena, nausea and vomiting.  Genitourinary:  Positive for frequency. Negative for dysuria and urgency.  Musculoskeletal:  Negative for back pain and joint pain.  Skin:  Negative for itching.  Neurological:  Positive for focal weakness. Negative for tingling, weakness and headaches.  Endo/Heme/Allergies:  Does not bruise/bleed easily.  Psychiatric/Behavioral:  Negative for depression. The patient is not nervous/anxious and does not have insomnia.      MEDICAL HISTORY:  Past Medical History:  Diagnosis Date   Arthritis    Dysrhythmia    A-fib   Hip pain    left   HOH (hard of hearing)  LBBB (left bundle branch block) 08/05/2020   Lung cancer ([name])    Skin cancer, basal cell     face top of head    SURGICAL HISTORY: Past Surgical History:  Procedure Laterality Date   CATARACT EXTRACTION W/PHACO Right 07/13/2017   Procedure: CATARACT EXTRACTION PHACO AND INTRAOCULAR LENS PLACEMENT (IOC);  Surgeon: Leandrew Koyanagi, MD;  Location:  [place];  Service: Ophthalmology;  Laterality: Right;  IVA TOPICAL RIGHT   CATARACT EXTRACTION W/PHACO Left 01/04/2018   Procedure: CATARACT EXTRACTION PHACO AND INTRAOCULAR LENS PLACEMENT ([place]) LEFT;  Surgeon: Leandrew Koyanagi, MD;  Location: [facility];  Service: Ophthalmology;  Laterality: Left;   COLONOSCOPY     FEMUR FRACTURE SURGERY Right 01/03/2022   INTRAMEDULLARY (IM) NAIL INTERTROCHANTERIC Right 01/03/2022   Procedure: INTRAMEDULLARY (IM) NAIL INTERTROCHANTRIC;  Surgeon: Corky Mull, MD;  Location: [facility];  Service: Orthopedics;  Laterality: Right;   IR IMAGING GUIDED PORT INSERTION  08/27/2020   JOINT REPLACEMENT     Left total hip Dr. Su Hoff 08-04-18   ROTATOR CUFF REPAIR Right    SKIN CANCER EXCISION     face   TOTAL HIP ARTHROPLASTY Left 08/04/2018   Procedure: TOTAL HIP ARTHROPLASTY ANTERIOR APPROACH;  Surgeon: Frederik Pear, MD;  Location: [facility];  Service: Orthopedics;  Laterality: Left;   VIDEO BRONCHOSCOPY WITH ENDOBRONCHIAL NAVIGATION N/A 08/07/2020   Procedure: VIDEO BRONCHOSCOPY WITH ENDOBRONCHIAL NAVIGATION;  Surgeon: Ottie Glazier, MD;  Location: [facility];  Service: Thoracic;  Laterality: N/A;   VIDEO BRONCHOSCOPY WITH ENDOBRONCHIAL ULTRASOUND N/A 08/07/2020   Procedure: VIDEO BRONCHOSCOPY WITH ENDOBRONCHIAL ULTRASOUND;  Surgeon: Ottie Glazier, MD;  Location: [facility];  Service: Thoracic;  Laterality: N/A;    SOCIAL HISTORY: Social History   Socioeconomic History   Marital status: Married    Spouse name: Not on file   Number of children: Not on file   Years of education: Not on file   Highest education level: Not on file  Occupational History   Not on file  Tobacco Use   Smoking status: Former    Packs/day: 1.00    Years: 4.00    Total pack years: 4.00    Types: Cigarettes   Smokeless tobacco: Never   Tobacco comments:    quit early 70's  Vaping Use   Vaping Use: Never used  Substance and Sexual Activity    Alcohol use: No   Drug use: No   Sexual activity: Not Currently  Other Topics Concern   Not on file  Social History Narrative   > quit 35 years; smoked for 15 years. Rare alcohol. 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    Social Determinants of Health   Financial Resource Strain: Not on file  Food Insecurity: Not on file  Transportation Needs: Not on file  Physical Activity: Not on file  Stress: Not on file  Social Connections: Not on file  Intimate Partner Violence: Not on file    FAMILY HISTORY: Family History  Problem Relation Age of Onset   Throat cancer Brother         & lung cancer    ALLERGIES:  has No Known Allergies.  MEDICATIONS:  Current Outpatient Medications  Medication Sig Dispense Refill   acetaminophen (TYLENOL) 500 MG tablet Take 1 tablet (500 mg total) by mouth every 6 (six) hours as needed. 30 tablet 0   apixaban (ELIQUIS) 5 MG TABS tablet Take 5  mg by mouth 2 (two) times daily.     doxycycline (VIBRA-TABS) 100 MG tablet Take 100 mg by mouth 2 (two) times daily.     hydroxypropyl methylcellulose / hypromellose (ISOPTO TEARS / GONIOVISC) 2.5 % ophthalmic solution Place 1 drop into the left eye daily as needed for dry eyes.     lidocaine-prilocaine (EMLA) cream Apply 1 Application topically as needed. Apply small amount to port site at least 1 hour prior to it being accessed, cover with plastic wrap 30 g 1   meloxicam (MOBIC) 15 MG tablet Take 15 mg by mouth daily.     Multiple Vitamin (MULTIVITAMIN) tablet Take 1 tablet by mouth daily.     pravastatin (PRAVACHOL) 80 MG tablet Take 80 mg by mouth at bedtime.      saw palmetto 160 MG capsule Take 160 mg by mouth 2 (two) times daily.     Vibegron (GEMTESA) 75 MG TABS Take 75 mg by mouth daily. 30 tablet 0   No current facility-administered medications for this visit.   Facility-Administered Medications Ordered in Other Visits  Medication Dose  Route Frequency Provider Last Rate Last Admin   heparin lock flush 100 UNIT/ML injection            sodium chloride flush (NS) 0.9 % injection 10 mL  10 mL Intravenous PRN [name], MD   10 mL at 02/11/21 0835   PHYSICAL EXAMINATION: ECOG PERFORMANCE STATUS: 1 - Symptomatic but completely ambulatory  Vitals:   05/05/22 1529  BP: 102/75  Pulse: 87  Temp: (!) 96.5 F (35.8 C)  SpO2: 99%   Filed Weights   05/05/22 1529  Weight: 163 lb 6.4 oz (74.1 kg)   Bilateral upper lower extremity boils noted.  Physical Exam HENT:     Head: Normocephalic and atraumatic.     Mouth/Throat:     Pharynx: No oropharyngeal exudate.  Eyes:     Pupils: Pupils are equal, round, and reactive to light.  Cardiovascular:     Rate and Rhythm: Normal rate and regular rhythm.  Pulmonary:     Effort: Pulmonary effort is normal. No respiratory distress.     Breath sounds: No wheezing.     Comments: Slightly decreased breath sounds left lower lung base. Abdominal:     General: Bowel sounds are normal. There is no distension.     Palpations: Abdomen is soft. There is no mass.     Tenderness: There is no abdominal tenderness. There is no guarding or rebound.  Musculoskeletal:        General: No tenderness. Normal range of motion.     Cervical back: Normal range of motion and neck supple.  Skin:    General: Skin is warm.  Neurological:     Mental Status: He is alert and oriented to person, place, and time.  Psychiatric:        Mood and Affect: Affect normal.      LABORATORY DATA:  I have reviewed the data as listed Lab Results  Component Value Date   WBC 8.0 04/09/2022   HGB 13.1 04/09/2022   HCT 38.6 (L) 04/09/2022   MCV 94.8 04/09/2022   PLT 284 04/09/2022   Recent Labs    02/26/22 0920 03/19/22 1018 04/09/22 0921  NA 137 131* 131*  K 3.8 4.5 3.9  CL 103 98 100  CO2 _0 GLUCOSE 99 123* 152*  BUN _1 CREATININE 0.94 0.77 1.04  CALCIUM 8.5* 7.9*  8.0*   GFRNONAA >60 >60 >60  PROT 6.5 5.8* 6.1*  ALBUMIN 3.7 3.2* 3.1*  AST 27 42* 33  ALT 21 64* 27  ALKPHOS 130* 117 153*  BILITOT 0.5 1.1 0.9    RADIOGRAPHIC STUDIES: I have personally reviewed the radiological images as listed and agreed with the findings in the report. No results found.   ASSESSMENT & PLAN:   Stage 4 lung cancer, left ([place]) #Stage IV- -non-small cell lung cancer-[favor adenocarcinoma] [at dx-stage III- s/p  concurrent chemoradiation; s/p  adjuvant durvalumab; finished Feb 2023]; with metachronous brain metastases [March 2023];  Patient CT scan February 2023 chest and pelvis-no evidence of any progressive disease.  CT chest pending next week.  # Brain metastases-March 2023-MRI-brain 2.1 cm s/p SBRT [March, 23rrd-2023]  MAY 2023-MRI: Small decrease in size of treated right frontal metastasis ;Increased surrounding edema is likely related to treatment.  Currently off steroids.  MRI pending next week.  # Mild intermittent elevation of of Alk phos/mild AST ALT elevation-? From Imfinzi- monitor for now-  STABLE.   #Skin nodules-s/p biopsy [Dr.Kim]-stain positive atypical AFB; repeat biopsy done/culture sent.  Discussed with Dr. Steva Ready ID.   # insomnia- Bil LE swelling: sec to steroids-Tapered off today.  #Thrombocytopenia mild new 83 platelets;- today improved; monitor closely.    # A. fib on Eliquis [Dr.Kowalski]-  Mild swelling in legs- EF in Feb 2022- 55-60%-   # Right hip fracture status post ORIF.  Nonpathologic.  Awaiting physical therapy.  #Difficulty with urination: Status post evaluation with urology no PVR; discontinued Flomax.  Recommended use of Gemtesa-  # MediPort flush q 6 W-stable.  #  DISPOSITION: # keep appt as planned/next week-Dr.B     All questions were answered. The patient knows to call the clinic with any problems, questions or concerns.   [name], MD 05/05/2022 4:19 PM

## 2022-05-05 NOTE — Assessment & Plan Note (Signed)
#  Stage IV- -non-small cell lung cancer-[favor adenocarcinoma] [at dx-stage III- s/p  concurrent chemoradiation; s/p  adjuvant durvalumab; finished Feb 2023]; with metachronous brain metastases [March 2023];  Patient CT scan February 2023 chest and pelvis-no evidence of any progressive disease.  CT chest pending next week.  # Brain metastases-March 2023-MRI-brain 2.1 cm s/p SBRT [March, 23rrd-2023]  MAY 2023-MRI: Small decrease in size of treated right frontal metastasis ;Increased surrounding edema is likely related to treatment.  Currently off steroids.  MRI pending next week.  # Mild intermittent elevation of of Alk phos/mild AST ALT elevation-? From Imfinzi- monitor for now-  STABLE.   #Skin nodules-s/p biopsy [Dr.Kim]-stain positive atypical AFB; repeat biopsy done/culture sent.  Discussed with Dr. Steva Ready ID.   # insomnia- Bil LE swelling: sec to steroids-Tapered off today.  #Thrombocytopenia mild new 83 platelets;- today improved; monitor closely.    # A. fib on Eliquis [Dr.Kowalski]-  Mild swelling in legs- EF in Feb 2022- 55-60%-   # Right hip fracture status post ORIF.  Nonpathologic.  Awaiting physical therapy.  #Difficulty with urination: Status post evaluation with urology no PVR; discontinued Flomax.  Recommended use of Gemtesa-  # MediPort flush q 6 W-stable.  #  DISPOSITION: # keep appt as planned/next week-Dr.B

## 2022-05-11 ENCOUNTER — Ambulatory Visit
Admission: RE | Admit: 2022-05-11 | Discharge: 2022-05-11 | Disposition: A | Discharge status: Home / Self Care | Payer: Medicare Other | Source: Ambulatory Visit | Attending: Internal Medicine | Admitting: Internal Medicine

## 2022-05-11 DIAGNOSIS — C7931 Secondary malignant neoplasm of brain: Secondary | ICD-10-CM | POA: Diagnosis present

## 2022-05-11 DIAGNOSIS — C3492 Malignant neoplasm of unspecified part of left bronchus or lung: Secondary | ICD-10-CM

## 2022-05-11 DIAGNOSIS — C3490 Malignant neoplasm of unspecified part of unspecified bronchus or lung: Secondary | ICD-10-CM | POA: Insufficient documentation

## 2022-05-11 LAB — POCT I-STAT CREATININE: Creatinine, Ser: 1 mg/dL (ref 0.61–1.24)

## 2022-05-11 MED ORDER — GADOBUTROL 1 MMOL/ML IV SOLN
7.5000 mL | Freq: Once | INTRAVENOUS | Status: AC | PRN
  Administered 2022-05-11: 7.5 mL via INTRAVENOUS

## 2022-05-11 MED ORDER — IOHEXOL 300 MG/ML  SOLN
75.0000 mL | Freq: Once | INTRAMUSCULAR | Status: AC | PRN
  Administered 2022-05-11: 75 mL via INTRAVENOUS

## 2022-05-14 ENCOUNTER — Encounter: Payer: Self-pay | Admitting: Internal Medicine

## 2022-05-14 ENCOUNTER — Inpatient Hospital Stay (HOSPITAL_BASED_OUTPATIENT_CLINIC_OR_DEPARTMENT_OTHER): Payer: Medicare Other | Admitting: Internal Medicine

## 2022-05-14 ENCOUNTER — Inpatient Hospital Stay: Payer: Medicare Other

## 2022-05-14 VITALS — BP 135/73 | HR 70 | Temp 95.3°F | Ht 72.0 in | Wt 168.0 lb

## 2022-05-14 DIAGNOSIS — C3432 Malignant neoplasm of lower lobe, left bronchus or lung: Secondary | ICD-10-CM

## 2022-05-14 DIAGNOSIS — C3492 Malignant neoplasm of unspecified part of left bronchus or lung: Secondary | ICD-10-CM | POA: Diagnosis not present

## 2022-05-14 LAB — CBC WITH DIFFERENTIAL/PLATELET
Abs Immature Granulocytes: 0.06 10*3/uL (ref 0.00–0.07)
Basophils Absolute: 0 10*3/uL (ref 0.0–0.1)
Basophils Relative: 0 %
Eosinophils Absolute: 0 10*3/uL (ref 0.0–0.5)
Eosinophils Relative: 0 %
HCT: 37.6 % — ABNORMAL LOW (ref 39.0–52.0)
Hemoglobin: 12.3 g/dL — ABNORMAL LOW (ref 13.0–17.0)
Immature Granulocytes: 1 %
Lymphocytes Relative: 19 %
Lymphs Abs: 1.8 10*3/uL (ref 0.7–4.0)
MCH: 31.1 pg (ref 26.0–34.0)
MCHC: 32.7 g/dL (ref 30.0–36.0)
MCV: 94.9 fL (ref 80.0–100.0)
Monocytes Absolute: 0.9 10*3/uL (ref 0.1–1.0)
Monocytes Relative: 9 %
Neutro Abs: 6.7 10*3/uL (ref 1.7–7.7)
Neutrophils Relative %: 71 %
Platelets: 396 10*3/uL (ref 150–400)
RBC: 3.96 MIL/uL — ABNORMAL LOW (ref 4.22–5.81)
RDW: 15.5 % (ref 11.5–15.5)
WBC: 9.5 10*3/uL (ref 4.0–10.5)
nRBC: 0 % (ref 0.0–0.2)

## 2022-05-14 LAB — COMPREHENSIVE METABOLIC PANEL
ALT: 14 U/L (ref 0–44)
AST: 24 U/L (ref 15–41)
Albumin: 3.4 g/dL — ABNORMAL LOW (ref 3.5–5.0)
Alkaline Phosphatase: 170 U/L — ABNORMAL HIGH (ref 38–126)
Anion gap: 12 (ref 5–15)
BUN: 15 mg/dL (ref 8–23)
CO2: 24 mmol/L (ref 22–32)
Calcium: 8.6 mg/dL — ABNORMAL LOW (ref 8.9–10.3)
Chloride: 98 mmol/L (ref 98–111)
Creatinine, Ser: 0.81 mg/dL (ref 0.61–1.24)
GFR, Estimated: >60 mL/min (ref >60)
Glucose, Bld: 104 mg/dL — ABNORMAL HIGH (ref 70–99)
Potassium: 4 mmol/L (ref 3.5–5.1)
Sodium: 134 mmol/L — ABNORMAL LOW (ref 135–145)
Total Bilirubin: 0.6 mg/dL (ref 0.3–1.2)
Total Protein: 6.6 g/dL (ref 6.5–8.1)

## 2022-05-14 MED ORDER — HEPARIN SOD (PORK) LOCK FLUSH 100 UNIT/ML IV SOLN
500.0000 [IU] | Freq: Once | INTRAVENOUS | Status: AC
  Filled 2022-05-14: qty 5

## 2022-05-14 NOTE — Progress Notes (Signed)
[place] NOTE  Patient Care Team: Baxter Hire, MD as PCP - General (Internal Medicine) [name], RN as Oncology Nurse Navigator [name], MD as Consulting Physician (Hematology and Oncology) [name], RN as Registered Nurse (Oncology)  CHIEF COMPLAINTS/PURPOSE OF CONSULTATION: Lung cancer  #  Oncology History Overview Note  # NOV 2021- LEFT LOWER LOBE NON-SMALL CELL CA [favor adeno ca]; T2N3 [right hilar; subcarinal LN;[name]; NOV 2021-MRI [place]  # 11/30- carbo-Taxol-RT [RT until 10/23/20]; s/p IMFINZI on 2/24. [28 months]; finished February 2023.  # #Brain metastases-March 2023-MRI-brain 2.1 cm peripherally enhancing lesion in the right paracentral lobule concerning for metastatic disease. There is mild perilesional edema and regional mass effect but no midline shift. On dexamethasone 2 mg a day.  Noted to have significant movement of the left lower extremity weakness.  Plan with SBRT on 3/22  # Right lower lobe - ~4.6 x 3.1 cm  [PET 07-2020]- demonstrates no significant hypermetabolic activity (SUV max 1.8]-benign. STABLE.     # A.fib [Eliquis; Dr.Kowalski]  # # SURVIVORSHIP:   # GENETICS:   #NGS-ordered  DIAGNOSIS: Lung cancer  STAGE:   III      ;  GOALS:  cure  CURRENT/MOST RECENT THERAPY : carbo-Taxol-RT ]   Stage 4 lung cancer, left ([place])  08/14/2020 Initial Diagnosis   Cancer of lower lobe of left lung ([place])   09/02/2020 - 10/22/2020 Chemotherapy    Patient is on Treatment Plan: LUNG DURVALUMAB Q14D      10/16/2020 Cancer Staging   Staging form: Lung, AJCC 8th Edition - Clinical: Stage IIIB (cT2a, cN3, cM0) - Signed by [name], MD on 10/16/2020   12/17/2020 - 11/27/2021 Chemotherapy   Patient is on Treatment Plan : LUNG Durvalumab q14d      HISTORY OF PRESENTING ILLNESS: Patient with a rolling walker.  He is accompanied by his wife/daughter.  [name] 88 y.o.  male  patient with currently stage IV [metachronus stage III lung non-small cell lung; s/p treatment feb 2023 with Brain mets status post [facility] MARCH 2023 is here for a follow up-reviewed results of his CT scan chest; also MRI brain.  In the interim patient diagnosed with atypical mycobacterial infection of the extremities s/p biopsy/culture with dermatology.  Awaiting evaluation with ID next week.  Patient has been currently off steroids.  Denies any worsening weakness in his leg.  Complains of leg swelling.  Continues to complain of increased frequency of urination; but not want to take Gemtesa.  Mild cough mild fatigue not significantly worse.  Continues to have mild difficulty swallowing mostly solids.   Review of Systems  Constitutional:  Negative for chills, diaphoresis, fever, malaise/fatigue and weight loss.  HENT:  Negative for nosebleeds and sore throat.   Eyes:  Negative for double vision.  Respiratory:  Negative for hemoptysis, sputum production, shortness of breath and wheezing.   Cardiovascular:  Negative for chest pain, palpitations, orthopnea and leg swelling.  Gastrointestinal:  Negative for abdominal pain, blood in stool, constipation, diarrhea, heartburn, melena, nausea and vomiting.  Genitourinary:  Positive for frequency. Negative for dysuria and urgency.  Musculoskeletal:  Negative for back pain and joint pain.  Skin:  Negative for itching.  Neurological:  Positive for focal weakness. Negative for tingling, weakness and headaches.  Endo/Heme/Allergies:  Does not bruise/bleed easily.  Psychiatric/Behavioral:  Negative for depression. The patient is not nervous/anxious and does not have insomnia.      MEDICAL HISTORY:  Past  Medical History:  Diagnosis Date   Arthritis    Dysrhythmia    A-fib   Hip pain    left   HOH (hard of hearing)    LBBB (left bundle branch block) 08/05/2020   Lung cancer ([place])    Skin cancer, basal cell     face top of head    SURGICAL  HISTORY: Past Surgical History:  Procedure Laterality Date   CATARACT EXTRACTION W/PHACO Right 07/13/2017   Procedure: CATARACT EXTRACTION PHACO AND INTRAOCULAR LENS PLACEMENT ([place]);  Surgeon: Leandrew Koyanagi, MD;  Location: [place];  Service: Ophthalmology;  Laterality: Right;  IVA TOPICAL RIGHT   CATARACT EXTRACTION W/PHACO Left 01/04/2018   Procedure: CATARACT EXTRACTION PHACO AND INTRAOCULAR LENS PLACEMENT ([place]) LEFT;  Surgeon: Leandrew Koyanagi, MD;  Location: [place];  Service: Ophthalmology;  Laterality: Left;   COLONOSCOPY     FEMUR FRACTURE SURGERY Right 01/03/2022   INTRAMEDULLARY (IM) NAIL INTERTROCHANTERIC Right 01/03/2022   Procedure: INTRAMEDULLARY (IM) NAIL INTERTROCHANTRIC;  Surgeon: Corky Mull, MD;  Location: [facility];  Service: Orthopedics;  Laterality: Right;   IR IMAGING GUIDED PORT INSERTION  08/27/2020   JOINT REPLACEMENT     Left total hip Dr. Su Hoff 08-04-18   ROTATOR CUFF REPAIR Right    SKIN CANCER EXCISION     face   TOTAL HIP ARTHROPLASTY Left 08/04/2018   Procedure: TOTAL HIP ARTHROPLASTY ANTERIOR APPROACH;  Surgeon: Frederik Pear, MD;  Location: [facility];  Service: Orthopedics;  Laterality: Left;   VIDEO BRONCHOSCOPY WITH ENDOBRONCHIAL NAVIGATION N/A 08/07/2020   Procedure: VIDEO BRONCHOSCOPY WITH ENDOBRONCHIAL NAVIGATION;  Surgeon: Ottie Glazier, MD;  Location: [facility];  Service: Thoracic;  Laterality: N/A;   VIDEO BRONCHOSCOPY WITH ENDOBRONCHIAL ULTRASOUND N/A 08/07/2020   Procedure: VIDEO BRONCHOSCOPY WITH ENDOBRONCHIAL ULTRASOUND;  Surgeon: Ottie Glazier, MD;  Location: [facility];  Service: Thoracic;  Laterality: N/A;    SOCIAL HISTORY: Social History   Socioeconomic History   Marital status: Married    Spouse name: Not on file   Number of children: Not on file   Years of education: Not on file   Highest education level: Not on file  Occupational History   Not on file  Tobacco Use   Smoking status: Former     Packs/day: 1.00    Years: 4.00    Total pack years: 4.00    Types: Cigarettes   Smokeless tobacco: Never   Tobacco comments:    quit early 70's  Vaping Use   Vaping Use: Never used  Substance and Sexual Activity   Alcohol use: No   Drug use: No   Sexual activity: Not Currently  Other Topics Concern   Not on file  Social History Narrative   > quit 35 years; smoked for 15 years. Rare alcohol. 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    Social Determinants of Health   Financial Resource Strain: Not on file  Food Insecurity: Not on file  Transportation Needs: Not on file  Physical Activity: Not on file  Stress: Not on file  Social Connections: Not on file  Intimate Partner Violence: Not on file    FAMILY HISTORY: Family History  Problem Relation Age of Onset   Throat cancer Brother         & lung cancer    ALLERGIES:  has No Known Allergies.  MEDICATIONS:  Current Outpatient Medications  Medication Sig Dispense Refill  acetaminophen (TYLENOL) 500 MG tablet Take 1 tablet (500 mg total) by mouth every 6 (six) hours as needed. 30 tablet 0   apixaban (ELIQUIS) 5 MG TABS tablet Take 5 mg by mouth 2 (two) times daily.     hydroxypropyl methylcellulose / hypromellose (ISOPTO TEARS / GONIOVISC) 2.5 % ophthalmic solution Place 1 drop into the left eye daily as needed for dry eyes.     lidocaine-prilocaine (EMLA) cream Apply 1 Application topically as needed. Apply small amount to port site at least 1 hour prior to it being accessed, cover with plastic wrap 30 g 1   meloxicam (MOBIC) 15 MG tablet Take 15 mg by mouth daily.     Multiple Vitamin (MULTIVITAMIN) tablet Take 1 tablet by mouth daily.     pravastatin (PRAVACHOL) 80 MG tablet Take 80 mg by mouth at bedtime.      saw palmetto 160 MG capsule Take 160 mg by mouth 2 (two) times daily.     Vibegron (GEMTESA) 75 MG TABS Take 75 mg by mouth daily. 30 tablet 0    No current facility-administered medications for this visit.   Facility-Administered Medications Ordered in Other Visits  Medication Dose Route Frequency Provider Last Rate Last Admin   heparin lock flush 100 UNIT/ML injection            heparin lock flush 100 unit/mL  500 Units Intravenous Once Charlaine Dalton R, MD       sodium chloride flush (NS) 0.9 % injection 10 mL  10 mL Intravenous PRN [name], MD   10 mL at 02/11/21 0835   PHYSICAL EXAMINATION: ECOG PERFORMANCE STATUS: 1 - Symptomatic but completely ambulatory  Vitals:   05/14/22 1034  BP: 135/73  Pulse: 70  Temp: (!) 95.3 F (35.2 C)  SpO2: 100%   Filed Weights   05/14/22 1034  Weight: 168 lb (76.2 kg)   Bilateral upper lower extremity boils noted.  Physical Exam HENT:     Head: Normocephalic and atraumatic.     Mouth/Throat:     Pharynx: No oropharyngeal exudate.  Eyes:     Pupils: Pupils are equal, round, and reactive to light.  Cardiovascular:     Rate and Rhythm: Normal rate and regular rhythm.  Pulmonary:     Effort: Pulmonary effort is normal. No respiratory distress.     Breath sounds: No wheezing.     Comments: Slightly decreased breath sounds left lower lung base. Abdominal:     General: Bowel sounds are normal. There is no distension.     Palpations: Abdomen is soft. There is no mass.     Tenderness: There is no abdominal tenderness. There is no guarding or rebound.  Musculoskeletal:        General: No tenderness. Normal range of motion.     Cervical back: Normal range of motion and neck supple.  Skin:    General: Skin is warm.  Neurological:     Mental Status: He is alert and oriented to person, place, and time.  Psychiatric:        Mood and Affect: Affect normal.      LABORATORY DATA:  I have reviewed the data as listed Lab Results  Component Value Date   WBC 9.5 05/14/2022   HGB 12.3 (L) 05/14/2022   HCT 37.6 (L) 05/14/2022   MCV 94.9 05/14/2022   PLT 396  05/14/2022   Recent Labs    03/19/22 1018 04/09/22 0921 05/11/22 1047 05/14/22 1027  NA 131*  131*  --  134*  K 4.5 3.9  --  4.0  CL 98 100  --  98  CO2 26 24  --  24  GLUCOSE 123* 152*  --  104*  BUN 22 22  --  15  CREATININE 0.77 1.04 1.00 0.81  CALCIUM 7.9* 8.0*  --  8.6*  GFRNONAA >60 >60  --  >60  PROT 5.8* 6.1*  --  6.6  ALBUMIN 3.2* 3.1*  --  3.4*  AST 42* 33  --  24  ALT 64* 27  --  14  ALKPHOS 117 153*  --  170*  BILITOT 1.1 0.9  --  0.6    RADIOGRAPHIC STUDIES: I have personally reviewed the radiological images as listed and agreed with the findings in the report. CT Chest W Contrast  Result Date: 05/11/2022 CLINICAL DATA:  Non-small cell lung cancer (NSCLC), metastatic, assess treatment response. * Tracking Code: BO * EXAM: CT CHEST WITH CONTRAST TECHNIQUE: Multidetector CT imaging of the chest was performed during intravenous contrast administration. RADIATION DOSE REDUCTION: This exam was performed according to the departmental dose-optimization program which includes automated exposure control, adjustment of the mA and/or kV according to patient size and/or use of iterative reconstruction technique. CONTRAST:  40m OMNIPAQUE IOHEXOL 300 MG/ML  SOLN COMPARISON:  11/03/2021 chest CT. FINDINGS: Cardiovascular: Normal heart size. No significant pericardial effusion/thickening. Left anterior descending and right coronary atherosclerosis. Right internal jugular Port-A-Cath terminates in the lower third of the SVC. Atherosclerotic nonaneurysmal thoracic aorta. Normal caliber pulmonary arteries. No central pulmonary emboli. Mediastinum/Nodes: No discrete thyroid nodules. Unremarkable esophagus. No axillary adenopathy. Newly enlarged 1.9 cm short axis diameter posterior right tracheoesophageal groove node (series 2/image 49), increased from 0.9 cm. No additional pathologically enlarged mediastinal nodes. No hilar adenopathy. Lungs/Pleura: No pneumothorax. Small dependent left pleural  effusion is stable. No right pleural effusion. Masslike consolidation in superior segment right lower lobe with associated bronchiectasis, volume loss, distortion and surrounding mild ground-glass opacity (series 3/image 93), not substantially changed since baseline 07/17/2020 chest CT, favoring postinfectious scarring or remote post treatment change. Sharply marginated left parahilar lung consolidation with associated volume loss, bronchiectasis and distortion, unchanged and compatible with radiation fibrosis. Solid 0.3 cm right upper lobe pulmonary nodule (series 3/image 89) is stable. No acute consolidative airspace disease or new significant pulmonary nodules. Upper abdomen: Simple 2.4 cm upper right renal cyst, for which no follow-up imaging is recommended. Musculoskeletal: No aggressive appearing focal osseous lesions. Chronic moderate L1 vertebral compression fracture. Mild-to-moderate thoracolumbar spondylosis. IMPRESSION: 1. Newly enlarged posterior right tracheoesophageal groove mediastinal lymph node, compatible with recurrent nodal metastasis. Suggest PET-CT for restaging. 2. Stable left perihilar radiation fibrosis without local tumor recurrence. Stable small dependent left pleural effusion. 3. No additional potential findings of metastatic disease in the chest. 4. Two-vessel coronary atherosclerosis. 5. Chronic moderate L1 vertebral compression fracture. 6. Aortic Atherosclerosis (ICD10-I70.0). Electronically Signed   By: JIlona SorrelM.D.   On: 05/11/2022 16:31   MR BRAIN W WO CONTRAST  Result Date: 05/11/2022 CLINICAL DATA:  Provided history: Stage IV lung cancer. Lung cancer metastatic to brain. Brain metastases, assess treatment response. EXAM: MRI HEAD WITHOUT AND WITH CONTRAST TECHNIQUE: Multiplanar, multiecho pulse sequences of the brain and surrounding structures were obtained without and with intravenous contrast. CONTRAST:  7.528mGADAVIST GADOBUTROL 1 MMOL/ML IV SOLN COMPARISON:  Prior  brain MRI examinations 02/22/2022 and earlier. FINDINGS: The axial T1 weighted post-contrast sequence is moderately motion degraded. Brain: Mild-to-moderate generalized parenchymal atrophy. A  peripherally enhancing lesion in the posteromedial right frontal lobe has decreased in size since the prior brain MRI of 02/22/2022, now measuring 9 x 8 mm in transaxial dimensions (previously 18 x 15 mm. There is some developing encephalomalacia at this site. Surrounding edema persists, but has significantly decreased. Within the limitations of motion degraded post-contrast imaging, no new intracranial metastasis is identified. Background mild-to-moderate multifocal T2 FLAIR hyperintense signal abnormality within the cerebral white matter and pons, nonspecific but compatible with chronic small vessel ischemic disease. Redemonstrated chronic microhemorrhages within the right parietal lobe and posterior left temporal lobe. There is no acute infarct. No extra-axial fluid collection. No midline shift. Vascular: Maintained flow voids within the proximal large arterial vessels. Skull and upper cervical spine: No focal suspicious marrow lesion. Sinuses/Orbits: No mass or acute finding within the imaged orbits. Prior bilateral ocular lens replacement. No significant paranasal sinus disease. IMPRESSION: Continued interval decrease in size of a peripherally enhancing right frontal lobe metastasis, now measuring 9 x 8 mm. Surrounding edema persists, but has also decreased. Within the limitations of motion degraded post-contrast imaging, no new intracranial metastasis is identified. Electronically Signed   By: Kellie Simmering D.O.   On: 05/11/2022 16:09     ASSESSMENT & PLAN:   Stage 4 lung cancer, left ([place]) #Stage IV- -non-small cell lung cancer-[favor adenocarcinoma] [at dx-stage III- s/p  concurrent chemoradiation; s/p  adjuvant durvalumab; finished Feb 2023]; with metachronous brain metastases [March 2023];   AUG 8th, 2023- CT  Newly enlarged posterior right tracheoesophageal groove mediastinal lymph node, compatible with recurrent nodal metastasis. Stable left perihilar radiation fibrosis without local tumor recurrence. Stable small dependent left pleural effusion.  Recommend PET scan for further evaluation.   # Discussed the likelihood of recurrent in the tracheoesophageal groove.  Discussed options of immunotherapy versus radiation.  Suspect radiation cause esophagitis.  Will discuss with Dr. Donella Stade when PET available.  # Brain metastases--s/p SBRT [March, 23rrd-2023] AUG 2023- Continued interval decrease in size of a peripherally enhancing right frontal lobe metastasis, now measuring 9 x 8 mm. Surrounding edema persists, but has also decreased.  Monitor for now.  # Mild intermittent elevation of of Alk phos/mild AST ALT elevation-? From Imfinzi- monitor for now- STABLE.   #Skin nodules-s/p biopsy [Dr.Kim]-stain positive atypical AFB; repeat biopsy done/culture sent.  Discussed with Dr. Steva Ready ID; on 8/15.   # insomnia- Bil LE swelling: sec to steroids-Tapered off today.  # A. fib on Eliquis [Dr.Kowalski]-  Mild swelling in legs- EF in Feb 2022- 55-60%- discussed re: lasix; declines.   # Right hip fracture status post ORIF.  Nonpathologic. On  physical therapy- STABLE.  #Difficulty with urination: Status post evaluation with urology no PVR; discontinued Flomax.  Recommended use of Gemtesa-  # MediPort flush q 6 W-stable.  # DISPOSITION: # follow up in roughly 2 weeks; MD; no labs; PET prior- -Dr.B  # I reviewed the blood work- with the patient in detail; also reviewed the imaging independently [as summarized above]; and with the patient in detail.        All questions were answered. The patient knows to call the clinic with any problems, questions or concerns.   [name], MD 05/14/2022 12:19 PM

## 2022-05-14 NOTE — Assessment & Plan Note (Addendum)
#  Stage IV- -non-small cell lung cancer-[favor adenocarcinoma] [at dx-stage III- s/p  concurrent chemoradiation; s/p  adjuvant durvalumab; finished Feb 2023]; with metachronous brain metastases [March 2023];   AUG 8th, 2023- CT Newly enlarged posterior right tracheoesophageal groove mediastinal lymph node, compatible with recurrent nodal metastasis. Stable left perihilar radiation fibrosis without local tumor recurrence. Stable small dependent left pleural effusion.  Recommend PET scan for further evaluation.   # Discussed the likelihood of recurrent in the tracheoesophageal groove.  Discussed options of immunotherapy versus radiation.  Suspect radiation cause esophagitis.  Will discuss with Dr. Donella Stade when PET available.  # Brain metastases--s/p SBRT [March, 23rrd-2023] AUG 2023- Continued interval decrease in size of a peripherally enhancing right frontal lobe metastasis, now measuring 9 x 8 mm. Surrounding edema persists, but has also decreased.  Monitor for now.  # Mild intermittent elevation of of Alk phos/mild AST ALT elevation-? From Imfinzi- monitor for now- STABLE.   #Skin nodules-s/p biopsy [Dr.Kim]-stain positive atypical AFB; repeat biopsy done/culture sent.  Discussed with Dr. Steva Ready ID; on 8/15.   # insomnia- Bil LE swelling: sec to steroids-Tapered off today.  # A. fib on Eliquis [Dr.Kowalski]-  Mild swelling in legs- EF in Feb 2022- 55-60%- discussed re: lasix; declines.   # Right hip fracture status post ORIF.  Nonpathologic. On  physical therapy- STABLE.  #Difficulty with urination: Status post evaluation with urology no PVR; discontinued Flomax.  Recommended use of Gemtesa-  # MediPort flush q 6 W-stable.  # DISPOSITION: # follow up in roughly 2 weeks; MD; no labs; PET prior- -Dr.B  # I reviewed the blood work- with the patient in detail; also reviewed the imaging independently [as summarized above]; and with the patient in detail.

## 2022-05-14 NOTE — Progress Notes (Signed)
Swelling in feet and ankles, and infection in both legs.   Infectious disease appt 05/18/22.

## 2022-05-18 ENCOUNTER — Encounter: Payer: Self-pay | Admitting: Infectious Diseases

## 2022-05-18 ENCOUNTER — Ambulatory Visit: Payer: Medicare Other | Attending: Infectious Diseases | Admitting: Infectious Diseases

## 2022-05-18 ENCOUNTER — Other Ambulatory Visit
Admission: RE | Admit: 2022-05-18 | Discharge: 2022-05-18 | Disposition: A | Discharge status: Home / Self Care | Payer: Medicare Other | Attending: Infectious Diseases | Admitting: Infectious Diseases

## 2022-05-18 VITALS — BP 130/80 | HR 83 | Temp 98.0°F | Ht 72.0 in | Wt 163.0 lb

## 2022-05-18 DIAGNOSIS — Z923 Personal history of irradiation: Secondary | ICD-10-CM | POA: Insufficient documentation

## 2022-05-18 DIAGNOSIS — Z9221 Personal history of antineoplastic chemotherapy: Secondary | ICD-10-CM | POA: Insufficient documentation

## 2022-05-18 DIAGNOSIS — Z85118 Personal history of other malignant neoplasm of bronchus and lung: Secondary | ICD-10-CM | POA: Diagnosis not present

## 2022-05-18 DIAGNOSIS — I4891 Unspecified atrial fibrillation: Secondary | ICD-10-CM | POA: Insufficient documentation

## 2022-05-18 DIAGNOSIS — R2243 Localized swelling, mass and lump, lower limb, bilateral: Secondary | ICD-10-CM | POA: Insufficient documentation

## 2022-05-18 DIAGNOSIS — A319 Mycobacterial infection, unspecified: Secondary | ICD-10-CM | POA: Diagnosis present

## 2022-05-18 NOTE — Patient Instructions (Addendum)
You are here for skin infection- need to talk to lab- will do blood culture today. Follow up soon

## 2022-05-18 NOTE — Progress Notes (Signed)
NAME: Jason Warren  DOB: 1929-03-31  MRN: [id number]  Date/Time: 05/18/2022 9:47 AM  REQUESTING PROVIDER: Dr.Brahmandy Subjective:  REASON FOR CONSULT: skin lesions- AFB noted in gram stain ? Jason Warren is a 88 y.o. male is here with his wife and daughter . He has a  history of Afib, metatstaic lung cancer  s/p chemo, radiation, PDLI inhibitor and radiation to brain for mets He is currently not on any treatment for the past few months  Is referred to me for mulitple skin nodules some of which are ulcerating- he has had this for the apst 2 months and it started on his rt forearm and now has close to 20 scattered over legs and arms He has fatigue . 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  Pt is here to discuss management options   Past Medical History:  Diagnosis Date   Arthritis    Dysrhythmia    A-fib   Hip pain    left   HOH (hard of hearing)    LBBB (left bundle branch block) 08/05/2020   Lung cancer ([place])    Skin cancer, basal cell     face top of head    Past Surgical History:  Procedure Laterality Date   CATARACT EXTRACTION W/PHACO Right 07/13/2017   Procedure: CATARACT EXTRACTION PHACO AND INTRAOCULAR LENS PLACEMENT ([place]);  Surgeon: Leandrew Koyanagi, MD;  Location: [facility];  Service: Ophthalmology;  Laterality: Right;  IVA TOPICAL RIGHT   CATARACT EXTRACTION W/PHACO Left 01/04/2018   Procedure: CATARACT EXTRACTION PHACO AND INTRAOCULAR LENS PLACEMENT ([place]) LEFT;  Surgeon: Leandrew Koyanagi, MD;  Location: [facility];  Service: Ophthalmology;  Laterality: Left;   COLONOSCOPY     FEMUR FRACTURE SURGERY Right 01/03/2022   INTRAMEDULLARY (IM) NAIL INTERTROCHANTERIC Right 01/03/2022   Procedure: INTRAMEDULLARY (IM) NAIL  INTERTROCHANTRIC;  Surgeon: Corky Mull, MD;  Location: [facility];  Service: Orthopedics;  Laterality: Right;   IR IMAGING GUIDED PORT INSERTION  08/27/2020   JOINT REPLACEMENT     Left total hip Dr. Su Hoff 08-04-18   ROTATOR CUFF REPAIR Right    SKIN CANCER EXCISION     face   TOTAL HIP ARTHROPLASTY Left 08/04/2018   Procedure: TOTAL HIP ARTHROPLASTY ANTERIOR APPROACH;  Surgeon: Frederik Pear, MD;  Location: [facility];  Service: Orthopedics;  Laterality: Left;   VIDEO BRONCHOSCOPY WITH ENDOBRONCHIAL NAVIGATION N/A 08/07/2020   Procedure: VIDEO BRONCHOSCOPY WITH ENDOBRONCHIAL NAVIGATION;  Surgeon: Ottie Glazier, MD;  Location: [facility];  Service: Thoracic;  Laterality: N/A;   VIDEO BRONCHOSCOPY WITH ENDOBRONCHIAL ULTRASOUND N/A 08/07/2020   Procedure: VIDEO BRONCHOSCOPY WITH ENDOBRONCHIAL ULTRASOUND;  Surgeon: Ottie Glazier, MD;  Location: [facility];  Service: Thoracic;  Laterality: N/A;    Social History   Socioeconomic History   Marital status: Married    Spouse name: Not on file   Number of children: Not on file   Years of education: Not on file   Highest education level: Not on file  Occupational History   Not on file  Tobacco Use   Smoking status: Former    Packs/day: 1.00    Years: 4.00    Total pack years: 4.00    Types: Cigarettes   Smokeless tobacco: Never   Tobacco comments:  quit early 70's  Vaping Use   Vaping Use: Never used  Substance and Sexual Activity   Alcohol use: No   Drug use: No   Sexual activity: Not Currently  Other Topics Concern   Not on file  Social History Narrative   > quit 35 years; smoked for 15 years. Rare alcohol. 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    Social Determinants of Health   Financial Resource Strain: Not on file  Food Insecurity: Not on file  Transportation Needs: Not on file  Physical Activity: Not on file  Stress: Not on file  Social Connections:  Not on file  Intimate Partner Violence: Not on file    Family History  Problem Relation Age of Onset   Throat cancer Brother         & lung cancer   No Known Allergies I? Current Outpatient Medications  Medication Sig Dispense Refill   acetaminophen (TYLENOL) 500 MG tablet Take 1 tablet (500 mg total) by mouth every 6 (six) hours as needed. 30 tablet 0   apixaban (ELIQUIS) 5 MG TABS tablet Take 5 mg by mouth 2 (two) times daily.     hydroxypropyl methylcellulose / hypromellose (ISOPTO TEARS / GONIOVISC) 2.5 % ophthalmic solution Place 1 drop into the left eye daily as needed for dry eyes.     lidocaine-prilocaine (EMLA) cream Apply 1 Application topically as needed. Apply small amount to port site at least 1 hour prior to it being accessed, cover with plastic wrap 30 g 1   Multiple Vitamin (MULTIVITAMIN) tablet Take 1 tablet by mouth daily.     pravastatin (PRAVACHOL) 80 MG tablet Take 80 mg by mouth at bedtime.      saw palmetto 160 MG capsule Take 160 mg by mouth 2 (two) times daily.     No current facility-administered medications for this visit.   Facility-Administered Medications Ordered in Other Visits  Medication Dose Route Frequency Provider Last Rate Last Admin   heparin lock flush 100 UNIT/ML injection            heparin lock flush 100 unit/mL  500 Units Intravenous Once Charlaine Dalton R, MD       sodium chloride flush (NS) 0.9 % injection 10 mL  10 mL Intravenous PRN [name], MD   10 mL at 02/11/21 0835     Abtx:  Anti-infectives (From admission, onward)    None       REVIEW OF SYSTEMS:  Const: negative fever, negative chills, + weight loss Eyes: negative diplopia or visual changes, negative eye pain ENT: negative coryza, negative sore throat Resp: + cough, +dyspnea Cards: negative for chest pain, palpitations, has lower extremity edema GU: negative for frequency, dysuria and hematuria GI: has upper abdominal pain and pain back Skin: multiple  skin nodules Heme: negative for easy bruising and gum/nose bleeding MS: r myalgias, arthralgias, back pain and muscle weakness Neurolo:negative for headaches, dizziness, vertigo, memory problems  Psych: negative for feelings of anxiety, depression  Endocrine: negative for thyroid, diabetes Allergy/Immunology- negative for any medication or food allergies  Objective:  VITALS:  BP 130/80   Pulse 83   Temp 98 F (36.7 C) (Temporal)   Ht 6' (1.829 m)   Wt 163 lb (73.9 kg)   BMI 22.11 kg/m   PHYSICAL EXAM:  General: Alert, cooperative, no distress, looks young for 89 yrs Hard of hearing Head: Normocephalic, without  obvious abnormality, atraumatic. Eyes: Conjunctivae clear, anicteric sclerae. Pupils are equal ENT Nares normal. No drainage or sinus tenderness. Lips, mucosa, and tongue normal. No Thrush Neck:symmetrical, no adenopathy, thyroid: non tender no carotid bruit and no JVD. Back: No CVA tenderness. Lungs: b/l air entry Decreased bases Heart: regular Abdomen: Soft, non-tender,not distended. Bowel sounds normal. No masses Extremities: b/l severe leg edema Skin: muliple violaceous nodules over skin- some of them with ulceraiton It is only on the extremities           Rt 2nd toe is swollen with a fluctuant nodule Lymph: Cervical, supraclavicular normal. Neurologic: Grossly non-focal Pertinent Labs Lab Results CBC    Component Value Date/Time   WBC 9.5 05/14/2022 1027   RBC 3.96 (L) 05/14/2022 1027   HGB 12.3 (L) 05/14/2022 1027   HCT 37.6 (L) 05/14/2022 1027   PLT 396 05/14/2022 1027   MCV 94.9 05/14/2022 1027   MCH 31.1 05/14/2022 1027   MCHC 32.7 05/14/2022 1027   RDW 15.5 05/14/2022 1027   LYMPHSABS 1.8 05/14/2022 1027   MONOABS 0.9 05/14/2022 1027   EOSABS 0.0 05/14/2022 1027   BASOSABS 0.0 05/14/2022 1027       Latest Ref Rng & Units 05/14/2022   10:27 AM 05/11/2022   10:47 AM 04/09/2022    9:21 AM  CMP  Glucose 70 - 99 mg/dL 104   152   BUN 8  - 23 mg/dL 15   22   Creatinine 0.61 - 1.24 mg/dL 0.81  1.00  1.04   Sodium 135 - 145 mmol/L 134   131   Potassium 3.5 - 5.1 mmol/L 4.0   3.9   Chloride 98 - 111 mmol/L 98   100   CO2 22 - 32 mmol/L 24   24   Calcium 8.9 - 10.3 mg/dL 8.6   8.0   Total Protein 6.5 - 8.1 g/dL 6.6   6.1   Total Bilirubin 0.3 - 1.2 mg/dL 0.6   0.9   Alkaline Phos 38 - 126 U/L 170   153   AST 15 - 41 U/L 24   33   ALT 0 - 44 U/L 14   27       Microbiology: No results found for this or any previous visit (from the past 240 hour(s)).  IMAGING RESULTS:  I have personally reviewed the films ?Newly enlarged posterior right tracheoesophageal groove mediastinal lymph node, compatible with recurrent nodal metastasis. . 2. Stable left perihilar radiation fibrosis without local tumor recurrence. Stable small dependent left pleural effusion.   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 Less likely to be MAI/[place] which are primarily lung infection Chelonae, fortuitum are easy to treat but m. Abscessus has multiple resistance pattern including erm gene and resistance to macrolide Will check to see whether the first slide for pathology can be sent for 16s RNA sequencing to [facility] It would be better to start treatment sooner that wit for culture results which may take 3-6 weeks If NGS cannot be done ten would empirically start Rx with possibly a  macrolide, IV imipenem and may be tigecycline  Discussed the management with patient, wife and  daughter. Spoke to Dr.kim Dermatologist and then to [place] pathologist ( [phone]) Also communicated with [facility] lab. Total time spent providing care and coordinating care 70 min  ?Note:  This document was prepared using Dragon voice recognition software and may include unintentional dictation errors.

## 2022-05-19 ENCOUNTER — Encounter: Payer: Self-pay | Admitting: Internal Medicine

## 2022-05-20 ENCOUNTER — Other Ambulatory Visit: Payer: Self-pay | Admitting: Internal Medicine

## 2022-05-20 LAB — ACID FAST SMEAR (AFB, MYCOBACTERIA)

## 2022-05-20 MED ORDER — FUROSEMIDE 20 MG PO TABS: 20.0000 mg | ORAL_TABLET | Freq: Every day | ORAL | 0 refills | Status: DC

## 2022-05-20 NOTE — Progress Notes (Signed)
I sent a prescription for Lasix 20 mg a day to the pharmacy-[facility].  Sent a message to the daughter.  FYI

## 2022-05-24 ENCOUNTER — Telehealth: Payer: Self-pay

## 2022-05-24 ENCOUNTER — Encounter: Payer: Self-pay | Admitting: Infectious Diseases

## 2022-05-24 NOTE — Telephone Encounter (Signed)
Patient's daughter called wanting to discuss blood culture results. Relayed that final result is not back yet, they would like to be called when this results to discuss next steps.   Beryle Flock, RN

## 2022-05-25 NOTE — Telephone Encounter (Signed)
Patient's daughter called wanting an update. Advised her that blood culture has not resulted yet. She is wondering if Dr. Delaine Lame is still planning on sending in antibiotics. Will route to provider.   Beryle Flock, RN

## 2022-05-27 ENCOUNTER — Other Ambulatory Visit: Payer: Self-pay | Admitting: Infectious Diseases

## 2022-05-27 ENCOUNTER — Encounter
Admission: RE | Admit: 2022-05-27 | Discharge: 2022-05-27 | Disposition: A | Discharge status: Home / Self Care | Payer: Medicare Other | Source: Ambulatory Visit | Attending: Internal Medicine | Admitting: Internal Medicine

## 2022-05-27 ENCOUNTER — Encounter: Payer: Self-pay | Admitting: Infectious Diseases

## 2022-05-27 DIAGNOSIS — J90 Pleural effusion, not elsewhere classified: Secondary | ICD-10-CM | POA: Diagnosis not present

## 2022-05-27 DIAGNOSIS — R2232 Localized swelling, mass and lump, left upper limb: Secondary | ICD-10-CM | POA: Diagnosis not present

## 2022-05-27 DIAGNOSIS — C3492 Malignant neoplasm of unspecified part of left bronchus or lung: Secondary | ICD-10-CM | POA: Diagnosis present

## 2022-05-27 DIAGNOSIS — I700 Atherosclerosis of aorta: Secondary | ICD-10-CM | POA: Insufficient documentation

## 2022-05-27 LAB — GLUCOSE, CAPILLARY: Glucose-Capillary: 94 mg/dL (ref 70–99)

## 2022-05-27 MED ORDER — SULFAMETHOXAZOLE-TRIMETHOPRIM 800-160 MG PO TABS: 1.0000 | ORAL_TABLET | Freq: Two times a day (BID) | ORAL | 0 refills | Status: DC

## 2022-05-27 MED ORDER — CLARITHROMYCIN 500 MG PO TABS
500.0000 mg | ORAL_TABLET | Freq: Two times a day (BID) | ORAL | 0 refills | Status: DC
Start: 2022-05-27 — End: 2022-06-08

## 2022-05-27 MED ORDER — FLUDEOXYGLUCOSE F - 18 (FDG) INJECTION
8.4000 | Freq: Once | INTRAVENOUS | Status: AC | PRN
  Administered 2022-05-27: 9 via INTRAVENOUS

## 2022-05-27 NOTE — Progress Notes (Signed)
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

## 2022-06-01 HISTORY — PX: MOHS SURGERY: SUR867

## 2022-06-02 ENCOUNTER — Encounter: Payer: Self-pay | Admitting: Internal Medicine

## 2022-06-02 ENCOUNTER — Inpatient Hospital Stay (HOSPITAL_BASED_OUTPATIENT_CLINIC_OR_DEPARTMENT_OTHER): Payer: Medicare Other | Admitting: Internal Medicine

## 2022-06-02 VITALS — BP 113/68 | HR 84 | Temp 97.3°F | Ht 72.0 in | Wt 162.6 lb

## 2022-06-02 DIAGNOSIS — C3492 Malignant neoplasm of unspecified part of left bronchus or lung: Secondary | ICD-10-CM

## 2022-06-02 DIAGNOSIS — C3432 Malignant neoplasm of lower lobe, left bronchus or lung: Secondary | ICD-10-CM | POA: Diagnosis not present

## 2022-06-02 MED ORDER — FUROSEMIDE 20 MG PO TABS: 20.0000 mg | ORAL_TABLET | Freq: Every day | ORAL | 0 refills | Status: DC

## 2022-06-02 MED ORDER — TRAZODONE HCL 50 MG PO TABS: 50.0000 mg | ORAL_TABLET | Freq: Every evening | ORAL | 3 refills | Status: DC | PRN

## 2022-06-02 NOTE — Assessment & Plan Note (Addendum)
#  Stage IV- -non-small cell lung cancer-[favor adenocarcinoma] [at dx-stage III- s/p  concurrent chemoradiation; s/p  adjuvant durvalumab; finished Feb 2023]; with metachronous brain metastases [March 2023];   AUG-2023-CT/PET scan-  Tracheal esophageal lymph node displays moderate increased metabolic activity in signs of central necrosis compatible with metastatic lymph node. Signs of bony metastasis to the LEFT olecranon.  # Discussed the likelihood of recurrent in the tracheoesophageal groove.  Discussed options of immunotherapy versus radiation.   Discussed with Dr. Zenon Mayo off radiation at this time.  Discussed the use of immunotherapy; PD-L1-90 percent.   I discussed the mechanism of action; The goal of therapy is palliative; and length of treatments are likely ongoing/based upon the results of the scans. Discussed the potential side effects of immunotherapy including but not limited to diarrhea; skin rash; elevated LFTs/endocrine abnormalities etc.    # Brain metastases--s/p SBRT [March, 23rrd-2023] AUG 2023- Continued interval decrease in size of a peripherally enhancing right frontal lobe metastasis, now measuring 9 x 8 mm. Surrounding edema persists, but has also decreased.  Monitor for now.  #  Mild intermittent elevation of of Alk phos/mild AST ALT elevation-? From [facility]- monitor for now- STABLE.   #Skin nodules-s/p biopsy [Dr.Kim]-stain positive atypical AFB; repeat biopsy done/culture sent.  Multiple areas of cutaneous nodules with increased metabolic activity raising the question of cutaneous neoplasm. Discussed with Dr. Steva Ready ID; on 8/15- on Antibiotics [x 14 days clarithromycin/bactrim ].   # insomnia- Bil LE swelling: sec to steroids-Tapered off today.  # A. fib on Eliquis [Dr.Kowalski]-  Mild swelling in legs- EF in Feb 2022- 55-60%- discussed re: lasix; declines.   # Right hip fracture status post ORIF.  Nonpathologic. On  physical therapy- STABLE.  # MediPort flush q 6  W-stable.  # DISPOSITION: # follow up in 3 weeks- MD; labs- cbc/cmp;TSH/port- [facility] [NEW]Dr.B  # I reviewed the blood work- with the patient in detail; also reviewed the imaging independently [as summarized above]; and with the patient in detail.

## 2022-06-02 NOTE — Progress Notes (Signed)
Pt would like something to help him sleep.  Wife is concerned with swelling in the abdomen.  PET results.  Pt wants you to be aware of the skin infection he's being treated for on his arms, legs and toes.

## 2022-06-02 NOTE — Progress Notes (Signed)
[name]  Patient Care Team: Baxter Hire, MD as PCP - General (Internal Medicine) [name], RN as Oncology Nurse Navigator [name], MD as Consulting Physician (Hematology and Oncology) [name], RN as Registered Nurse (Oncology)  CHIEF COMPLAINTS/PURPOSE OF CONSULTATION: Lung cancer  #  Oncology History Overview Note  # NOV 2021- LEFT LOWER LOBE NON-SMALL CELL CA [favor adeno ca]; T2N3 [right hilar; subcarinal LN;[name]; NOV 2021-MRI [place]  # 11/30- carbo-Taxol-RT [RT until 10/23/20]; s/p IMFINZI on 2/24. [70 months]; finished February 2023.  # #Brain metastases-March 2023-MRI-brain 2.1 cm peripherally enhancing lesion in the right paracentral lobule concerning for metastatic disease. There is mild perilesional edema and regional mass effect but no midline shift. On dexamethasone 2 mg a day.  Noted to have significant movement of the left lower extremity weakness.  Plan with SBRT on 3/22.  # SEP 20th, 2023- Keytruda [PDL-1:90%]  # Right lower lobe - ~4.6 x 3.1 cm  [PET 07-2020]- demonstrates no significant hypermetabolic activity (SUV max 1.8]-benign. STABLE.     # A.fib [Eliquis; Dr.Kowalski]  # # SURVIVORSHIP:   # GENETICS:   #NGS-ordered  DIAGNOSIS: Lung cancer  STAGE:   III      ;  GOALS:  cure  CURRENT/MOST RECENT THERAPY : carbo-Taxol-RT ]   Stage 4 lung cancer, left ([place])  08/14/2020 Initial Diagnosis   Cancer of lower lobe of left lung ([place])   09/02/2020 - 10/22/2020 Chemotherapy    Patient is on Treatment Plan: LUNG DURVALUMAB Q14D      10/16/2020 Cancer Staging   Staging form: Lung, AJCC 8th Edition - Clinical: Stage IIIB (cT2a, cN3, cM0) - Signed by [name], MD on 10/16/2020   12/17/2020 - 11/27/2021 Chemotherapy   Patient is on Treatment Plan : LUNG Durvalumab q14d     06/23/2022 -  Chemotherapy   Patient is on Treatment Plan : LUNG NSCLC Pembrolizumab (200) q21d       HISTORY OF PRESENTING ILLNESS: Patient in a wheelchair.  He is accompanied by his wife/daughter.  Jason Warren 88 y.o.  male patient with currently stage IV [metachronus stage III lung non-small cell lung; s/p treatment feb 2023 with Brain mets status post [facility] 2023 is here for a follow up-review results of the PET scan.  In the interim patient diagnosed with atypical mycobacterial infection of the extremities s/p biopsy/culture with dermatology.  S/p evaluation with ID.  Currently on antibiotics.  Complains of ongoing fatigue.  Mild cough mild fatigue not significantly worse.  Continues to have mild difficulty swallowing mostly solids.   Review of Systems  Constitutional:  Positive for malaise/fatigue. Negative for chills, diaphoresis, fever and weight loss.  HENT:  Negative for nosebleeds and sore throat.   Eyes:  Negative for double vision.  Respiratory:  Negative for hemoptysis, sputum production, shortness of breath and wheezing.   Cardiovascular:  Positive for leg swelling. Negative for chest pain, palpitations and orthopnea.  Gastrointestinal:  Negative for abdominal pain, blood in stool, constipation, diarrhea, heartburn, melena, nausea and vomiting.  Genitourinary:  Negative for dysuria and urgency.  Musculoskeletal:  Negative for back pain and joint pain.  Skin:  Negative for itching.  Neurological:  Positive for focal weakness. Negative for tingling, weakness and headaches.  Endo/Heme/Allergies:  Does not bruise/bleed easily.  Psychiatric/Behavioral:  Negative for depression. The patient is not nervous/anxious and does not have insomnia.      MEDICAL HISTORY:  Past Medical History:  Diagnosis  Date   Arthritis    Dysrhythmia    A-fib   Hip pain    left   HOH (hard of hearing)    LBBB (left bundle branch block) 08/05/2020   Lung cancer ([place])    Skin cancer, basal cell     face top of head    SURGICAL HISTORY: Past Surgical History:  Procedure Laterality  Date   CATARACT EXTRACTION W/PHACO Right 07/13/2017   Procedure: CATARACT EXTRACTION PHACO AND INTRAOCULAR LENS PLACEMENT ([place]);  Surgeon: Leandrew Koyanagi, MD;  Location: [facility];  Service: Ophthalmology;  Laterality: Right;  IVA TOPICAL RIGHT   CATARACT EXTRACTION W/PHACO Left 01/04/2018   Procedure: CATARACT EXTRACTION PHACO AND INTRAOCULAR LENS PLACEMENT ([place]) LEFT;  Surgeon: Leandrew Koyanagi, MD;  Location: [facility];  Service: Ophthalmology;  Laterality: Left;   COLONOSCOPY     FEMUR FRACTURE SURGERY Right 01/03/2022   INTRAMEDULLARY (IM) NAIL INTERTROCHANTERIC Right 01/03/2022   Procedure: INTRAMEDULLARY (IM) NAIL INTERTROCHANTRIC;  Surgeon: Corky Mull, MD;  Location: [facility];  Service: Orthopedics;  Laterality: Right;   IR IMAGING GUIDED PORT INSERTION  08/27/2020   JOINT REPLACEMENT     Left total hip Dr. Su Hoff 08-04-18   MOHS SURGERY  06/01/2022   ROTATOR CUFF REPAIR Right    SKIN CANCER EXCISION     face   TOTAL HIP ARTHROPLASTY Left 08/04/2018   Procedure: TOTAL HIP ARTHROPLASTY ANTERIOR APPROACH;  Surgeon: Frederik Pear, MD;  Location: [facility];  Service: Orthopedics;  Laterality: Left;   VIDEO BRONCHOSCOPY WITH ENDOBRONCHIAL NAVIGATION N/A 08/07/2020   Procedure: VIDEO BRONCHOSCOPY WITH ENDOBRONCHIAL NAVIGATION;  Surgeon: Ottie Glazier, MD;  Location: [facility];  Service: Thoracic;  Laterality: N/A;   VIDEO BRONCHOSCOPY WITH ENDOBRONCHIAL ULTRASOUND N/A 08/07/2020   Procedure: VIDEO BRONCHOSCOPY WITH ENDOBRONCHIAL ULTRASOUND;  Surgeon: Ottie Glazier, MD;  Location: [facility];  Service: Thoracic;  Laterality: N/A;    SOCIAL HISTORY: Social History   Socioeconomic History   Marital status: Married    Spouse name: Not on file   Number of children: Not on file   Years of education: Not on file   Highest education level: Not on file  Occupational History   Not on file  Tobacco Use   Smoking status: Former    Packs/day: 1.00     Years: 4.00    Total pack years: 4.00    Types: Cigarettes   Smokeless tobacco: Never   Tobacco comments:    quit early 70's  Vaping Use   Vaping Use: Never used  Substance and Sexual Activity   Alcohol use: No   Drug use: No   Sexual activity: Not Currently  Other Topics Concern   Not on file  Social History Narrative   > quit 35 years; smoked for 15 years. Rare alcohol. 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    Social Determinants of Health   Financial Resource Strain: Not on file  Food Insecurity: Not on file  Transportation Needs: Not on file  Physical Activity: Not on file  Stress: Not on file  Social Connections: Not on file  Intimate Partner Violence: Not on file    FAMILY HISTORY: Family History  Problem Relation Age of Onset   Throat cancer Brother         & lung cancer    ALLERGIES:  has No Known Allergies.  MEDICATIONS:  Current Outpatient Medications  Medication Sig Dispense  Refill   acetaminophen (TYLENOL) 500 MG tablet Take 1 tablet (500 mg total) by mouth every 6 (six) hours as needed. 30 tablet 0   apixaban (ELIQUIS) 5 MG TABS tablet Take 5 mg by mouth 2 (two) times daily.     clarithromycin (BIAXIN) 500 MG tablet Take 1 tablet (500 mg total) by mouth 2 (two) times daily. 28 tablet 0   hydroxypropyl methylcellulose / hypromellose (ISOPTO TEARS / GONIOVISC) 2.5 % ophthalmic solution Place 1 drop into the left eye daily as needed for dry eyes.     lidocaine-prilocaine (EMLA) cream Apply 1 Application topically as needed. Apply small amount to port site at least 1 hour prior to it being accessed, cover with plastic wrap 30 g 1   Multiple Vitamin (MULTIVITAMIN) tablet Take 1 tablet by mouth daily.     pravastatin (PRAVACHOL) 80 MG tablet Take 80 mg by mouth at bedtime.      saw palmetto 160 MG capsule Take 160 mg by mouth 2 (two) times daily.     sulfamethoxazole-trimethoprim (BACTRIM DS)  800-160 MG tablet Take 1 tablet by mouth 2 (two) times daily. 28 tablet 0   traZODone (DESYREL) 50 MG tablet Take 1 tablet (50 mg total) by mouth at bedtime as needed for sleep. 30 tablet 3   furosemide (LASIX) 20 MG tablet Take 1 tablet (20 mg total) by mouth daily. 20 tablet 0   No current facility-administered medications for this visit.   Facility-Administered Medications Ordered in Other Visits  Medication Dose Route Frequency Provider Last Rate Last Admin   heparin lock flush 100 UNIT/ML injection            heparin lock flush 100 unit/mL  500 Units Intravenous Once Charlaine Dalton R, MD       sodium chloride flush (NS) 0.9 % injection 10 mL  10 mL Intravenous PRN Charlaine Dalton R, MD   10 mL at 02/11/21 0835   PHYSICAL EXAMINATION: ECOG PERFORMANCE STATUS: 1 - Symptomatic but completely ambulatory  Vitals:   06/02/22 1001  BP: 113/68  Pulse: 84  Temp: (!) 97.3 F (36.3 C)  SpO2: 99%   Filed Weights   06/02/22 1001  Weight: 162 lb 9.6 oz (73.8 kg)   Bilateral upper lower extremity boils noted.  Physical Exam HENT:     Head: Normocephalic and atraumatic.     Mouth/Throat:     Pharynx: No oropharyngeal exudate.  Eyes:     Pupils: Pupils are equal, round, and reactive to light.  Cardiovascular:     Rate and Rhythm: Normal rate and regular rhythm.  Pulmonary:     Effort: Pulmonary effort is normal. No respiratory distress.     Breath sounds: No wheezing.     Comments: Slightly decreased breath sounds left lower lung base. Abdominal:     General: Bowel sounds are normal. There is no distension.     Palpations: Abdomen is soft. There is no mass.     Tenderness: There is no abdominal tenderness. There is no guarding or rebound.  Musculoskeletal:        General: No tenderness. Normal range of motion.     Cervical back: Normal range of motion and neck supple.  Skin:    General: Skin is warm.  Neurological:     Mental Status: He is alert and oriented to person,  place, and time.  Psychiatric:        Mood and Affect: Affect normal.      LABORATORY DATA:  I have reviewed the data as listed Lab Results  Component Value Date   WBC 9.5 05/14/2022   HGB 12.3 (L) 05/14/2022   HCT 37.6 (L) 05/14/2022   MCV 94.9 05/14/2022   PLT 396 05/14/2022   Recent Labs    03/19/22 1018 04/09/22 0921 05/11/22 1047 05/14/22 1027  NA 131* 131*  --  134*  K 4.5 3.9  --  4.0  CL 98 100  --  98  CO2 26 24  --  24  GLUCOSE 123* 152*  --  104*  BUN 22 22  --  15  CREATININE 0.77 1.04 1.00 0.81  CALCIUM 7.9* 8.0*  --  8.6*  GFRNONAA >60 >60  --  >60  PROT 5.8* 6.1*  --  6.6  ALBUMIN 3.2* 3.1*  --  3.4*  AST 42* 33  --  24  ALT 64* 27  --  14  ALKPHOS 117 153*  --  170*  BILITOT 1.1 0.9  --  0.6    RADIOGRAPHIC STUDIES: I have personally reviewed the radiological images as listed and agreed with the findings in the report. NM PET Image Restage (PS) Skull Base to Thigh (F-18 FDG)  Result Date: 05/28/2022 CLINICAL DATA:  Subsequent treatment strategy for lung cancer with suspicion for disease recurrence. EXAM: NUCLEAR MEDICINE PET SKULL BASE TO THIGH TECHNIQUE: 9.0 mCi F-18 FDG was injected intravenously. Full-ring PET imaging was performed from the skull base to thigh after the radiotracer. CT data was obtained and used for attenuation correction and anatomic localization. Fasting blood glucose: 94 mg/dl COMPARISON:  Recent CT of the chest of May 11, 2022 and prior PET scan from October of 2021. FINDINGS: Mediastinal blood pool activity: SUV max 1.70 Liver activity: SUV max NA NECK: No hypermetabolic lymph nodes in the neck. Incidental CT findings: None. CHEST: Lymph node adjacent to the esophagus displays increased metabolic activity and central lack of FDG uptake (image 82/2) 2.5 x 2.2 cm with a maximum SUV of 3.81. No additional signs of hypermetabolic lymph node or lesion within the thoracic cavity. Incidental CT findings: Calcified aortic atherosclerotic  changes. Stable heart size. Stable aortic and central pulmonary vascular caliber. RIGHT-sided Port-A-Cath terminates at the caval to atrial junction. Small dependent LEFT-sided pleural effusion without areas of focal increased metabolic activity. Post treatment changes about the RIGHT and LEFT mid chest with volume loss and scarring emanating from the RIGHT and LEFT hilum showing no change or focal area of increased metabolic activity. ABDOMEN/PELVIS: No abnormal hypermetabolic activity within the liver, pancreas, adrenal glands, or spleen. No hypermetabolic lymph nodes in the abdomen or pelvis. Incidental CT findings: No acute process related to liver, gallbladder, pancreas, spleen, adrenal glands or of the kidneys. Urinary bladder is decompressed. Signs of bladder diverticulum along the RIGHT posterolateral urinary bladder. Limited assessment of the urinary bladder due to streak artifact from hip arthroplasty changes. No acute gastrointestinal findings. Stool throughout the colon. Aortic atherosclerosis. No signs of adenopathy by size criteria in the abdomen or the pelvis. SKELETON: Focal area of increased metabolic activity in the LEFT olecranon on is associated with central area of lytic change and some sclerosis (image 161/2) maximum SUV of 6.28 focal lytic area of 9 mm. No additional discrete bony lesion. Multiple cutaneous nodules most numerous over the LEFT upper arm, and along the RIGHT upper arm. These areas are not well assessed on CT but are reportedly visible per technologist notes, example of metabolic activity in nodularity seen on image 101/2  about the LEFT arm with a maximum SUV of 2.45. Also cutaneous nodules about the forearms bilaterally Nonspecific increased metabolic activity in the soft tissues about the RIGHT hip following prior ORIF, likely related to prior surgery Incidental CT findings: Spinal degenerative changes. IMPRESSION: Tracheal esophageal lymph node displays moderate increased  metabolic activity in signs of central necrosis compatible with metastatic lymph node. This may reflect recurrent lung cancer given patient history; however, given other findings on the current PET with multiple cutaneous lesions, tissue diagnosis should be considered as warranted. Multiple areas of cutaneous nodules with increased metabolic activity raising the question of cutaneous neoplasm. Correlate with recent biopsy results as the patient has reportedly undergone recent biopsy. Signs of bony metastasis to the LEFT olecranon. Electronically Signed   By: Zetta Bills M.D.   On: 05/28/2022 16:17   CT Chest W Contrast  Result Date: 05/11/2022 CLINICAL DATA:  Non-small cell lung cancer (NSCLC), metastatic, assess treatment response. * Tracking Code: BO * EXAM: CT CHEST WITH CONTRAST TECHNIQUE: Multidetector CT imaging of the chest was performed during intravenous contrast administration. RADIATION DOSE REDUCTION: This exam was performed according to the departmental dose-optimization program which includes automated exposure control, adjustment of the mA and/or kV according to patient size and/or use of iterative reconstruction technique. CONTRAST:  79m OMNIPAQUE IOHEXOL 300 MG/ML  SOLN COMPARISON:  11/03/2021 chest CT. FINDINGS: Cardiovascular: Normal heart size. No significant pericardial effusion/thickening. Left anterior descending and right coronary atherosclerosis. Right internal jugular Port-A-Cath terminates in the lower third of the SVC. Atherosclerotic nonaneurysmal thoracic aorta. Normal caliber pulmonary arteries. No central pulmonary emboli. Mediastinum/Nodes: No discrete thyroid nodules. Unremarkable esophagus. No axillary adenopathy. Newly enlarged 1.9 cm short axis diameter posterior right tracheoesophageal groove node (series 2/image 49), increased from 0.9 cm. No additional pathologically enlarged mediastinal nodes. No hilar adenopathy. Lungs/Pleura: No pneumothorax. Small dependent left  pleural effusion is stable. No right pleural effusion. Masslike consolidation in superior segment right lower lobe with associated bronchiectasis, volume loss, distortion and surrounding mild ground-glass opacity (series 3/image 93), not substantially changed since baseline 07/17/2020 chest CT, favoring postinfectious scarring or remote post treatment change. Sharply marginated left parahilar lung consolidation with associated volume loss, bronchiectasis and distortion, unchanged and compatible with radiation fibrosis. Solid 0.3 cm right upper lobe pulmonary nodule (series 3/image 89) is stable. No acute consolidative airspace disease or new significant pulmonary nodules. Upper abdomen: Simple 2.4 cm upper right renal cyst, for which no follow-up imaging is recommended. Musculoskeletal: No aggressive appearing focal osseous lesions. Chronic moderate L1 vertebral compression fracture. Mild-to-moderate thoracolumbar spondylosis. IMPRESSION: 1. Newly enlarged posterior right tracheoesophageal groove mediastinal lymph node, compatible with recurrent nodal metastasis. Suggest PET-CT for restaging. 2. Stable left perihilar radiation fibrosis without local tumor recurrence. Stable small dependent left pleural effusion. 3. No additional potential findings of metastatic disease in the chest. 4. Two-vessel coronary atherosclerosis. 5. Chronic moderate L1 vertebral compression fracture. 6. Aortic Atherosclerosis (ICD10-I70.0). Electronically Signed   By: JIlona SorrelM.D.   On: 05/11/2022 16:31   MR BRAIN W WO CONTRAST  Result Date: 05/11/2022 CLINICAL DATA:  Provided history: Stage IV lung cancer. Lung cancer metastatic to brain. Brain metastases, assess treatment response. EXAM: MRI HEAD WITHOUT AND WITH CONTRAST TECHNIQUE: Multiplanar, multiecho pulse sequences of the brain and surrounding structures were obtained without and with intravenous contrast. CONTRAST:  7.553mGADAVIST GADOBUTROL 1 MMOL/ML IV SOLN COMPARISON:   Prior brain MRI examinations 02/22/2022 and earlier. FINDINGS: The axial T1 weighted post-contrast sequence is moderately motion  degraded. Brain: Mild-to-moderate generalized parenchymal atrophy. A peripherally enhancing lesion in the posteromedial right frontal lobe has decreased in size since the prior brain MRI of 02/22/2022, now measuring 9 x 8 mm in transaxial dimensions (previously 18 x 15 mm. There is some developing encephalomalacia at this site. Surrounding edema persists, but has significantly decreased. Within the limitations of motion degraded post-contrast imaging, no new intracranial metastasis is identified. Background mild-to-moderate multifocal T2 FLAIR hyperintense signal abnormality within the cerebral white matter and pons, nonspecific but compatible with chronic small vessel ischemic disease. Redemonstrated chronic microhemorrhages within the right parietal lobe and posterior left temporal lobe. There is no acute infarct. No extra-axial fluid collection. No midline shift. Vascular: Maintained flow voids within the proximal large arterial vessels. Skull and upper cervical spine: No focal suspicious marrow lesion. Sinuses/Orbits: No mass or acute finding within the imaged orbits. Prior bilateral ocular lens replacement. No significant paranasal sinus disease. IMPRESSION: Continued interval decrease in size of a peripherally enhancing right frontal lobe metastasis, now measuring 9 x 8 mm. Surrounding edema persists, but has also decreased. Within the limitations of motion degraded post-contrast imaging, no new intracranial metastasis is identified. Electronically Signed   By: Kellie Simmering D.O.   On: 05/11/2022 16:09     ASSESSMENT & PLAN:   Stage 4 lung cancer, left ([place]) #Stage IV- -non-small cell lung cancer-[favor adenocarcinoma] [at dx-stage III- s/p  concurrent chemoradiation; s/p  adjuvant durvalumab; finished Feb 2023]; with metachronous brain metastases [March 2023];   AUG-2023-CT/PET  scan-  Tracheal esophageal lymph node displays moderate increased metabolic activity in signs of central necrosis compatible with metastatic lymph node. Signs of bony metastasis to the LEFT olecranon.  # Discussed the likelihood of recurrent in the tracheoesophageal groove.  Discussed options of immunotherapy versus radiation.   Discussed with Dr. Zenon Mayo off radiation at this time.  Discussed the use of immunotherapy; PD-L1-90 percent.   I discussed the mechanism of action; The goal of therapy is palliative; and length of treatments are likely ongoing/based upon the results of the scans. Discussed the potential side effects of immunotherapy including but not limited to diarrhea; skin rash; elevated LFTs/endocrine abnormalities etc.    # Brain metastases--s/p SBRT [March, 23rrd-2023] AUG 2023- Continued interval decrease in size of a peripherally enhancing right frontal lobe metastasis, now measuring 9 x 8 mm. Surrounding edema persists, but has also decreased.  Monitor for now.  #  Mild intermittent elevation of of Alk phos/mild AST ALT elevation-? From Imfinzi- monitor for now- STABLE.   #Skin nodules-s/p biopsy [Dr.Kim]-stain positive atypical AFB; repeat biopsy done/culture sent.  Multiple areas of cutaneous nodules with increased metabolic activity raising the question of cutaneous neoplasm. Discussed with Dr. Steva Ready ID; on 8/15- on Antibiotics [x 14 days clarithromycin/bactrim ].   # insomnia- Bil LE swelling: sec to steroids-Tapered off today.  # A. fib on Eliquis [Dr.Kowalski]-  Mild swelling in legs- EF in Feb 2022- 55-60%- discussed re: lasix; declines.   # Right hip fracture status post ORIF.  Nonpathologic. On  physical therapy- STABLE.  # MediPort flush q 6 W-stable.  # DISPOSITION: # follow up in 3 weeks- MD; labs- cbc/cmp;TSH/port- KEYTRUDA [NEW]Dr.B  # I reviewed the blood work- with the patient in detail; also reviewed the imaging independently [as summarized above];  and with the patient in detail.          All questions were answered. The patient knows to call the clinic with any problems, questions or concerns.  [name], MD 06/04/2022 1:56 AM

## 2022-06-02 NOTE — Progress Notes (Signed)
DISCONTINUE ON PATHWAY REGIMEN - Non-Small Cell Lung     A cycle is every 14 days:     Durvalumab   **Always confirm dose/schedule in your pharmacy ordering system**  REASON: Disease Progression PRIOR TREATMENT: [id number]: Durvalumab 10 mg/kg q14 Days x up to 12 Months TREATMENT RESPONSE: Progressive Disease (PD)  START ON PATHWAY REGIMEN - Non-Small Cell Lung     A cycle is every 21 days:     Pembrolizumab   **Always confirm dose/schedule in your pharmacy ordering system**  Patient Characteristics: Stage IV Metastatic, Nonsquamous, Molecular Analysis Completed, Molecular Alteration Present and Targeted Therapy Exhausted OR EGFR Exon 20+ or KRAS G12C+ or HER2+ Present and No Prior Chemo/Immunotherapy OR No Alteration Present, Second Line -  Chemotherapy/Immunotherapy, PS = 0, 1, No Prior PD-1/PD-L1  Inhibitor and Immunotherapy Candidate Therapeutic Status: Stage IV Metastatic Histology: Nonsquamous Cell Broad Molecular Profiling Status: Engineer, manufacturing Analysis Results: No Alteration Present ECOG Performance Status: 1 Chemotherapy/Immunotherapy Line of Therapy: Second Line Chemotherapy/Immunotherapy Immunotherapy Candidate Status: Candidate for Immunotherapy Prior Immunotherapy Status: No Prior PD-1/PD-L1 Inhibitor Intent of Therapy: Non-Curative / Palliative Intent, Discussed with Patient

## 2022-06-03 ENCOUNTER — Telehealth: Payer: Self-pay

## 2022-06-03 NOTE — Telephone Encounter (Signed)
Received call from [name], daughter of Mr. Jason Warren who has concerns regarding her father's culture results and if he need sto continue currently antibiotics. Advised family that results were still currently pending. Routing message to Dr. Delaine Lame to follow up.  Eugenia Mcalpine

## 2022-06-08 ENCOUNTER — Encounter: Payer: Self-pay | Admitting: Infectious Diseases

## 2022-06-08 ENCOUNTER — Other Ambulatory Visit
Admission: RE | Admit: 2022-06-08 | Discharge: 2022-06-08 | Disposition: A | Discharge status: Home / Self Care | Payer: Medicare Other | Source: Ambulatory Visit | Attending: Infectious Diseases | Admitting: Infectious Diseases

## 2022-06-08 ENCOUNTER — Ambulatory Visit: Payer: Medicare Other | Attending: Infectious Diseases | Admitting: Infectious Diseases

## 2022-06-08 ENCOUNTER — Telehealth: Payer: Self-pay

## 2022-06-08 VITALS — BP 117/73 | HR 80 | Temp 97.0°F | Wt 158.0 lb

## 2022-06-08 DIAGNOSIS — A311 Cutaneous mycobacterial infection: Secondary | ICD-10-CM | POA: Insufficient documentation

## 2022-06-08 DIAGNOSIS — Z85118 Personal history of other malignant neoplasm of bronchus and lung: Secondary | ICD-10-CM | POA: Insufficient documentation

## 2022-06-08 DIAGNOSIS — Z923 Personal history of irradiation: Secondary | ICD-10-CM | POA: Diagnosis not present

## 2022-06-08 DIAGNOSIS — L089 Local infection of the skin and subcutaneous tissue, unspecified: Secondary | ICD-10-CM | POA: Diagnosis not present

## 2022-06-08 DIAGNOSIS — A318 Other mycobacterial infections: Secondary | ICD-10-CM | POA: Insufficient documentation

## 2022-06-08 DIAGNOSIS — L98491 Non-pressure chronic ulcer of skin of other sites limited to breakdown of skin: Secondary | ICD-10-CM | POA: Diagnosis not present

## 2022-06-08 DIAGNOSIS — I4891 Unspecified atrial fibrillation: Secondary | ICD-10-CM | POA: Insufficient documentation

## 2022-06-08 DIAGNOSIS — R229 Localized swelling, mass and lump, unspecified: Secondary | ICD-10-CM | POA: Insufficient documentation

## 2022-06-08 DIAGNOSIS — J90 Pleural effusion, not elsewhere classified: Secondary | ICD-10-CM | POA: Diagnosis not present

## 2022-06-08 LAB — COMPREHENSIVE METABOLIC PANEL
ALT: 16 U/L (ref 0–44)
AST: 25 U/L (ref 15–41)
Albumin: 3.7 g/dL (ref 3.5–5.0)
Alkaline Phosphatase: 198 U/L — ABNORMAL HIGH (ref 38–126)
Anion gap: 10 (ref 5–15)
BUN: 18 mg/dL (ref 8–23)
CO2: 25 mmol/L (ref 22–32)
Calcium: 9.2 mg/dL (ref 8.9–10.3)
Chloride: 98 mmol/L (ref 98–111)
Creatinine, Ser: 1.31 mg/dL — ABNORMAL HIGH (ref 0.61–1.24)
GFR, Estimated: 52 mL/min — ABNORMAL LOW (ref >60)
Glucose, Bld: 99 mg/dL (ref 70–99)
Potassium: 5.3 mmol/L — ABNORMAL HIGH (ref 3.5–5.1)
Sodium: 133 mmol/L — ABNORMAL LOW (ref 135–145)
Total Bilirubin: 1 mg/dL (ref 0.3–1.2)
Total Protein: 7.4 g/dL (ref 6.5–8.1)

## 2022-06-08 LAB — CBC WITH DIFFERENTIAL/PLATELET
Abs Immature Granulocytes: 0.1 10*3/uL — ABNORMAL HIGH (ref 0.00–0.07)
Basophils Absolute: 0.1 10*3/uL (ref 0.0–0.1)
Basophils Relative: 1 %
Eosinophils Absolute: 0.1 10*3/uL (ref 0.0–0.5)
Eosinophils Relative: 1 %
HCT: 38.4 % — ABNORMAL LOW (ref 39.0–52.0)
Hemoglobin: 12.4 g/dL — ABNORMAL LOW (ref 13.0–17.0)
Immature Granulocytes: 1 %
Lymphocytes Relative: 18 %
Lymphs Abs: 1.6 10*3/uL (ref 0.7–4.0)
MCH: 30.8 pg (ref 26.0–34.0)
MCHC: 32.3 g/dL (ref 30.0–36.0)
MCV: 95.5 fL (ref 80.0–100.0)
Monocytes Absolute: 0.8 10*3/uL (ref 0.1–1.0)
Monocytes Relative: 9 %
Neutro Abs: 6.5 10*3/uL (ref 1.7–7.7)
Neutrophils Relative %: 70 %
Platelets: 458 10*3/uL — ABNORMAL HIGH (ref 150–400)
RBC: 4.02 MIL/uL — ABNORMAL LOW (ref 4.22–5.81)
RDW: 15.1 % (ref 11.5–15.5)
WBC: 9.1 10*3/uL (ref 4.0–10.5)
nRBC: 0 % (ref 0.0–0.2)

## 2022-06-08 MED ORDER — GENTAMICIN SULFATE 0.1 % EX OINT: 1.0000 | TOPICAL_OINTMENT | Freq: Three times a day (TID) | CUTANEOUS | 0 refills | Status: DC

## 2022-06-08 MED ORDER — CLARITHROMYCIN 500 MG PO TABS
500.0000 mg | ORAL_TABLET | Freq: Two times a day (BID) | ORAL | 1 refills | Status: DC
Start: 2022-06-08 — End: 2022-06-29

## 2022-06-08 MED ORDER — SULFAMETHOXAZOLE-TRIMETHOPRIM 800-160 MG PO TABS
1.0000 | ORAL_TABLET | Freq: Two times a day (BID) | ORAL | 1 refills | Status: DC
Start: 2022-06-08 — End: 2022-06-09

## 2022-06-08 NOTE — Telephone Encounter (Signed)
I reached out to the patient's daughter [name] regarding treatment plan. Per patient's daughter [name] patient would like to do oral antibiotics. Please send in antibiotics for the patient. I have also advised Pam with [facility] that the patient does not want to do IV antibiotics. Hevin Jeffcoat [name]

## 2022-06-08 NOTE — Patient Instructions (Addendum)
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 Or if that is not possible will have to add oral  third antibiotic. Let me know- follow up 3 weeks. Labs today

## 2022-06-08 NOTE — Progress Notes (Unsigned)
NAME: Jason Warren  DOB: 09-Dec-1928  MRN: [id number]  Date/Time: 06/08/2022 9:09 AM   Subjective:  Pt here for follow up- last seen on 05/18/22  Here with daughter,and wife ? Jason Warren is a 88 y.o. male is here with his wife and daughter . 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  Past Medical History:  Diagnosis Date   Arthritis    Dysrhythmia    A-fib   Hip pain    left   HOH (hard of hearing)    LBBB (left bundle branch block) 08/05/2020   Lung cancer (HCC)    Skin cancer, basal cell     face top of head    Past Surgical History:  Procedure Laterality Date   CATARACT EXTRACTION W/PHACO Right 07/13/2017   Procedure: CATARACT EXTRACTION PHACO AND INTRAOCULAR LENS PLACEMENT (IOC);  Surgeon: Lockie Mola, MD;  Location: [facility];  Service: Ophthalmology;  Laterality: Right;  IVA TOPICAL RIGHT   CATARACT EXTRACTION W/PHACO Left 01/04/2018   Procedure: CATARACT EXTRACTION PHACO AND INTRAOCULAR LENS PLACEMENT (IOC) LEFT;   Surgeon: Lockie Mola, MD;  Location: [facility];  Service: Ophthalmology;  Laterality: Left;   COLONOSCOPY     FEMUR FRACTURE SURGERY Right 01/03/2022   INTRAMEDULLARY (IM) NAIL INTERTROCHANTERIC Right 01/03/2022   Procedure: INTRAMEDULLARY (IM) NAIL INTERTROCHANTRIC;  Surgeon: Christena Flake, MD;  Location: [facility];  Service: Orthopedics;  Laterality: Right;   IR IMAGING GUIDED PORT INSERTION  08/27/2020   JOINT REPLACEMENT     Left total hip Dr. Nelia Shi 08-04-18   MOHS SURGERY  06/01/2022   ROTATOR CUFF REPAIR Right    SKIN CANCER EXCISION     face   TOTAL HIP ARTHROPLASTY Left 08/04/2018   Procedure: TOTAL HIP ARTHROPLASTY ANTERIOR APPROACH;  Surgeon: Gean Birchwood, MD;  Location: [facility];  Service: Orthopedics;  Laterality: Left;   VIDEO BRONCHOSCOPY WITH ENDOBRONCHIAL NAVIGATION N/A 08/07/2020   Procedure: VIDEO BRONCHOSCOPY WITH ENDOBRONCHIAL NAVIGATION;  Surgeon: Vida Rigger, MD;  Location: [facility];  Service: Thoracic;  Laterality: N/A;   VIDEO BRONCHOSCOPY WITH ENDOBRONCHIAL ULTRASOUND N/A 08/07/2020   Procedure: VIDEO BRONCHOSCOPY WITH ENDOBRONCHIAL ULTRASOUND;  Surgeon: Vida Rigger, MD;  Location: [facility];  Service: Thoracic;  Laterality: N/A;    Social History   Socioeconomic History   Marital status: Married    Spouse name: Not on file   Number of children: Not on file   Years of education: Not on file  Highest education level: Not on file  Occupational History   Not on file  Tobacco Use   Smoking status: Former    Packs/day: 1.00    Years: 4.00    Total pack years: 4.00    Types: Cigarettes   Smokeless tobacco: Never   Tobacco comments:    quit early 70's  Vaping Use   Vaping Use: Never used  Substance and Sexual Activity   Alcohol use: No   Drug use: No   Sexual activity: Not Currently  Other Topics Concern   Not on file  Social History Narrative   > quit 35 years; smoked for 15 years. Rare alcohol. 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    Social Determinants of Health   Financial Resource Strain: Not on file  Food Insecurity: Not on file  Transportation Needs: Not on file  Physical Activity: Not on file  Stress: Not on file  Social Connections: Not on file  Intimate Partner Violence: Not on file    Family History  Problem Relation Age of Onset   Throat cancer Brother         & lung cancer   No Known Allergies I? Current Outpatient Medications  Medication Sig Dispense Refill   acetaminophen (TYLENOL) 500 MG tablet Take 1 tablet (500 mg total) by mouth every 6 (six) hours as needed. 30 tablet 0   apixaban (ELIQUIS) 5 MG TABS tablet Take 5 mg by mouth 2 (two) times daily.     clarithromycin (BIAXIN) 500 MG tablet Take 1 tablet (500 mg total) by mouth 2 (two) times daily. 28 tablet 0   furosemide (LASIX) 20 MG tablet Take 1 tablet (20 mg total) by mouth daily. 20 tablet 0   hydroxypropyl methylcellulose / hypromellose (ISOPTO TEARS / GONIOVISC) 2.5 % ophthalmic solution Place 1 drop into the left eye daily as needed for dry eyes.     lidocaine-prilocaine (EMLA) cream Apply 1 Application topically as needed. Apply small amount to port site at least 1 hour prior to it being accessed, cover with plastic wrap 30 g 1   Multiple Vitamin (MULTIVITAMIN) tablet Take 1 tablet by mouth daily.     pravastatin (PRAVACHOL) 80 MG tablet Take 80 mg by mouth at bedtime.      saw palmetto 160 MG capsule Take 160 mg by mouth 2 (two) times daily.     sulfamethoxazole-trimethoprim (BACTRIM DS) 800-160 MG tablet Take 1 tablet by mouth 2 (two) times daily. 28 tablet 0   traZODone (DESYREL) 50 MG tablet Take 1 tablet (50 mg total) by mouth at bedtime as needed for sleep. 30 tablet 3   No current facility-administered medications for this visit.   Facility-Administered Medications Ordered in Other Visits  Medication Dose Route Frequency Provider Last Rate Last  Admin   heparin lock flush 100 UNIT/ML injection            heparin lock flush 100 unit/mL  500 Units Intravenous Once Charlaine Dalton R, MD       sodium chloride flush (NS) 0.9 % injection 10 mL  10 mL Intravenous PRN [name], MD   10 mL at 02/11/21 0835     Abtx:  Anti-infectives (From admission, onward)    None       REVIEW OF SYSTEMS:  Const: negative fever, negative chills, + weight loss Eyes: negative diplopia or visual changes, negative eye pain ENT: negative  coryza, negative sore throat Resp: + cough, +dyspnea Cards: negative for chest pain, palpitations, has lower extremity edema GU: negative for frequency, dysuria and hematuria GI: has upper abdominal pain and pain back Skin: multiple skin nodules Heme: negative for easy bruising and gum/nose bleeding MS: r myalgias, arthralgias, back pain and muscle weakness Neurolo:negative for headaches, dizziness, vertigo, memory problems  Psych: negative for feelings of anxiety, depression  Endocrine: negative for thyroid, diabetes Allergy/Immunology- negative for any medication or food allergies  Objective:  VITALS:  BP 117/73   Pulse 80   Temp (!) 97 F (36.1 C) (Temporal)   Wt 158 lb (71.7 kg)   BMI 21.43 kg/m   PHYSICAL EXAM:  General: Alert, cooperative, no distress, looks young for 89 yrs Hard of hearing Head: Normocephalic, without obvious abnormality, atraumatic. Eyes: Conjunctivae clear, anicteric sclerae. Pupils are equal ENT Nares normal. No drainage or sinus tenderness. Lips, mucosa, and tongue normal. No Thrush Neck:symmetrical, no adenopathy, thyroid: non tender no carotid bruit and no JVD. Back: No CVA tenderness. Lungs: b/l air entry Decreased bases Heart: regular Abdomen: Soft, non-tender,not distended. Bowel sounds normal. No masses Extremities: b/l severe leg edema Skin: muliple violaceous nodules over skin- some of them with ulceraiton It is only on the  extremities           Rt 2nd toe is swollen with a fluctuant nodule Lymph: Cervical, supraclavicular normal. Neurologic: Grossly non-focal Pertinent Labs Lab Results CBC    Component Value Date/Time   WBC 9.5 05/14/2022 1027   RBC 3.96 (L) 05/14/2022 1027   HGB 12.3 (L) 05/14/2022 1027   HCT 37.6 (L) 05/14/2022 1027   PLT 396 05/14/2022 1027   MCV 94.9 05/14/2022 1027   MCH 31.1 05/14/2022 1027   MCHC 32.7 05/14/2022 1027   RDW 15.5 05/14/2022 1027   LYMPHSABS 1.8 05/14/2022 1027   MONOABS 0.9 05/14/2022 1027   EOSABS 0.0 05/14/2022 1027   BASOSABS 0.0 05/14/2022 1027       Latest Ref Rng & Units 05/14/2022   10:27 AM 05/11/2022   10:47 AM 04/09/2022    9:21 AM  CMP  Glucose 70 - 99 mg/dL 104   152   BUN 8 - 23 mg/dL 15   22   Creatinine 0.61 - 1.24 mg/dL 0.81  1.00  1.04   Sodium 135 - 145 mmol/L 134   131   Potassium 3.5 - 5.1 mmol/L 4.0   3.9   Chloride 98 - 111 mmol/L 98   100   CO2 22 - 32 mmol/L 24   24   Calcium 8.9 - 10.3 mg/dL 8.6   8.0   Total Protein 6.5 - 8.1 g/dL 6.6   6.1   Total Bilirubin 0.3 - 1.2 mg/dL 0.6   0.9   Alkaline Phos 38 - 126 U/L 170   153   AST 15 - 41 U/L 24   33   ALT 0 - 44 U/L 14   27       Microbiology: No results found for this or any previous visit (from the past 240 hour(s)).  IMAGING RESULTS:  I have personally reviewed the films ?Newly enlarged posterior right tracheoesophageal groove mediastinal lymph node, compatible with recurrent nodal metastasis. . 2. Stable left perihilar radiation fibrosis without local tumor recurrence. Stable small dependent left pleural effusion.   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 Less likely to be MAI/[place] which are primarily lung infection Chelonae, fortuitum are easy to treat but m. Abscessus has multiple resistance pattern including erm gene and resistance to macrolide Will check to see whether the first slide for pathology can be sent for 16s RNA sequencing to [facility] It would be better to start treatment sooner that wit for culture results which may take 3-6 weeks If NGS cannot be done ten would empirically start Rx with possibly a macrolide, IV imipenem and may be tigecycline  Discussed the management with patient, wife and daughter. Spoke to Dr.kim Dermatologist and then to [place] pathologist ( [phone]) Also communicated with [facility] lab. Total time spent providing care and coordinating care 70 min  ?Note:  This document was prepared using Dragon voice recognition software and may include unintentional dictation errors.

## 2022-06-09 ENCOUNTER — Telehealth: Payer: Self-pay | Admitting: Infectious Diseases

## 2022-06-09 MED ORDER — LEVOFLOXACIN 500 MG PO TABS: 500.0000 mg | ORAL_TABLET | Freq: Every day | ORAL | 0 refills | Status: DC

## 2022-06-09 MED ORDER — DOXYCYCLINE MONOHYDRATE 100 MG PO TABS: 100.0000 mg | ORAL_TABLET | Freq: Two times a day (BID) | ORAL | 0 refills | Status: DC

## 2022-06-09 NOTE — Telephone Encounter (Signed)
Pt has mycobacterium chelonae multiple skin and soft tissue infection in his extremities- wa son bactrim + clarithromycin- bactrim caused K to go up 5.3, and cr 1.3 So it is discontinued He will take Doxycycline 100mg  PO BID He will continue clarithromycin 500mg  PO BID Adding a third antibiotic because of extensive soft tissue infection and new ones showing up Levaquin 500mg  Po Q24 hr Watch for Qtc prolongation with clarithro and levaquin Watch for any increase in bleeding with clarithro and apixiban Discussed with daughter

## 2022-06-17 ENCOUNTER — Other Ambulatory Visit: Payer: Self-pay | Admitting: Infectious Diseases

## 2022-06-17 ENCOUNTER — Ambulatory Visit: Payer: Medicare Other | Admitting: Infectious Diseases

## 2022-06-17 DIAGNOSIS — A319 Mycobacterial infection, unspecified: Secondary | ICD-10-CM

## 2022-06-18 ENCOUNTER — Telehealth: Payer: Self-pay | Admitting: Internal Medicine

## 2022-06-18 ENCOUNTER — Other Ambulatory Visit: Payer: Self-pay | Admitting: Internal Medicine

## 2022-06-18 NOTE — Telephone Encounter (Signed)
I reached out to Dr.Ravishkar-recommends holding of immunotherapy until next evaluation on 9/26.  I tried to reach the patient to discuss recommendations-in agreement with holding therapy until cleared by ID; also plan to reschedule the treatment.  Left a voicemail/wife.  Re-schedule appointment to Oct 6th- MD; labs- cbc/cmp;TSH; Beryle Flock Dr.B

## 2022-06-21 ENCOUNTER — Other Ambulatory Visit: Payer: Self-pay | Admitting: Internal Medicine

## 2022-06-23 ENCOUNTER — Other Ambulatory Visit: Payer: Medicare Other

## 2022-06-23 ENCOUNTER — Ambulatory Visit: Payer: Medicare Other

## 2022-06-23 ENCOUNTER — Ambulatory Visit: Payer: Medicare Other | Admitting: Internal Medicine

## 2022-06-29 ENCOUNTER — Encounter: Payer: Self-pay | Admitting: Infectious Diseases

## 2022-06-29 ENCOUNTER — Ambulatory Visit: Payer: Medicare Other | Attending: Infectious Diseases | Admitting: Infectious Diseases

## 2022-06-29 VITALS — BP 111/73 | HR 87 | Temp 97.4°F | Wt 160.0 lb

## 2022-06-29 DIAGNOSIS — A311 Cutaneous mycobacterial infection: Secondary | ICD-10-CM | POA: Diagnosis not present

## 2022-06-29 DIAGNOSIS — A319 Mycobacterial infection, unspecified: Secondary | ICD-10-CM | POA: Diagnosis not present

## 2022-06-29 DIAGNOSIS — Z923 Personal history of irradiation: Secondary | ICD-10-CM | POA: Diagnosis not present

## 2022-06-29 DIAGNOSIS — C3490 Malignant neoplasm of unspecified part of unspecified bronchus or lung: Secondary | ICD-10-CM | POA: Diagnosis not present

## 2022-06-29 DIAGNOSIS — Z9221 Personal history of antineoplastic chemotherapy: Secondary | ICD-10-CM | POA: Diagnosis not present

## 2022-06-29 MED ORDER — LEVOFLOXACIN 500 MG PO TABS: 500.0000 mg | ORAL_TABLET | Freq: Every day | ORAL | 2 refills | Status: DC

## 2022-06-29 MED ORDER — CLARITHROMYCIN 500 MG PO TABS: 500.0000 mg | ORAL_TABLET | Freq: Two times a day (BID) | ORAL | 2 refills | Status: DC

## 2022-06-29 MED ORDER — DOXYCYCLINE MONOHYDRATE 100 MG PO TABS: 100.0000 mg | ORAL_TABLET | Freq: Two times a day (BID) | ORAL | 2 refills | Status: DC

## 2022-06-29 NOTE — Patient Instructions (Addendum)
You are here for follow up . You are doing better with the skin lesions due to mycobacterial infection- you will continue with levaquin, clarithromycin and doxycycline -follow up 42months,

## 2022-06-29 NOTE — Progress Notes (Signed)
NAME: Jason Warren  DOB: 12/08/1928  MRN: [id number]  Date/Time: 06/29/2022 10:37 AM   Subjective:  Pt here for follow up- mycobacterium chelonae skin and soft tiisue infection- arms and legs Here with daughter,and wife Doing better But has poor appetitie ? Jason Warren is a 88 y.o. male is here with his wife and daughter . He has a  history of Afib, metatstaic lung cancer  s/p chemo, radiation, PDLI inhibitor and radiation to brain for mets.  was referred to me in Aug for mulitple skin nodules some of which were ulcerating- he has had this for the past 3 months and it started on his rt forearm and now has close to 20 scattered over legs and arms  HE underwent a biopsy by Dr.Kim dermatologist on 04/29/22 and pathology showed rare AFB on stain- Another biopsy was taken from left arm for culture on 05/05/22 and it is still pending- the afb stain was negative. 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- He started clarithromycin and bactrim PO  on 05/28/22- Because of Hyperkalemia the bactrim was dc on 06/08/22 and he was place don Doxy 181m PO BID and levaquin 5069monce aday along with clarithromycin 50071mO BID Since then some of the lesions have started to shrink and no new ones He wears TED hose and that is stripping the scabs off HE will see Dr.Brahmandy oct 5th for Pdl1 inhibitor infusion   Past Medical History:  Diagnosis Date   Arthritis    Dysrhythmia    A-fib   Hip pain    left   HOH (hard of hearing)    LBBB (left bundle branch block) 08/05/2020   Lung cancer ([facility]  Skin cancer, basal cell     face top of head    Past Surgical History:  Procedure Laterality Date   CATARACT EXTRACTION W/PHACO Right 07/13/2017   Procedure: CATARACT EXTRACTION PHACO AND INTRAOCULAR LENS PLACEMENT ([place] Surgeon: BraLeandrew KoyanagiD;  [facility];  Laterality: Right;  IVA TOPICAL RIGHT   CATARACT EXTRACTION W/PHACO Left 01/04/2018   Procedure: CATARACT EXTRACTION PHACO AND INTRAOCULAR LENS PLACEMENT ([facility];  Surgeon: BraLeandrew KoyanagiD;  [facility];  Laterality: Left;   COLONOSCOPY     FEMUR FRACTURE SURGERY Right 01/03/2022   INTRAMEDULLARY (IM) NAIL INTERTROCHANTERIC Right 01/03/2022   Procedure: INTRAMEDULLARY (IM) NAIL INTERTROCHANTRIC;  Surgeon: PogCorky MullD;  Location: [facility];  Service: Orthopedics;  Laterality: Right;   IR IMAGING GUIDED PORT INSERTION  08/27/2020   JOINT REPLACEMENT     Left total hip Dr. FarSu Hoff-1-19   MOHS SURGERY  06/01/2022   ROTATOR CUFF REPAIR Right    SKIN CANCER EXCISION     face   TOTAL HIP ARTHROPLASTY Left 08/04/2018   Procedure: TOTAL HIP ARTHROPLASTY ANTERIOR APPROACH;  Surgeon: RowFrederik PearD;  Location: [facility];  Service: Orthopedics;  Laterality: Left;   VIDEO BRONCHOSCOPY WITH ENDOBRONCHIAL NAVIGATION N/A 08/07/2020   Procedure: VIDEO BRONCHOSCOPY WITH ENDOBRONCHIAL NAVIGATION;  Surgeon: AleOttie GlazierD;  Location: [facility];  Service: Thoracic;  Laterality: N/A;   VIDEO BRONCHOSCOPY WITH ENDOBRONCHIAL ULTRASOUND N/A 08/07/2020   Procedure: VIDEO BRONCHOSCOPY WITH ENDOBRONCHIAL ULTRASOUND;  Surgeon: AleOttie GlazierD;  Location: [facility];  Service: Thoracic;  Laterality: N/A;    Social History   Socioeconomic History  Marital status: Married    Spouse name: Not on file   Number of children: Not on file   Years of education: Not on file   Highest education level: Not on file  Occupational History   Not on file  Tobacco Use   Smoking status: Former    Packs/day: 1.00    Years: 4.00    Total pack years: 4.00    Types: Cigarettes   Smokeless tobacco: Never   Tobacco comments:    quit early 70's  Vaping Use   Vaping Use: Never used  Substance and Sexual Activity   Alcohol use: No   Drug  use: No   Sexual activity: Not Currently  Other Topics Concern   Not on file  Social History Narrative   > quit 35 years; smoked for 15 years. Rare alcohol. 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    Social Determinants of Health   Financial Resource Strain: Not on file  Food Insecurity: Not on file  Transportation Needs: Not on file  Physical Activity: Not on file  Stress: Not on file  Social Connections: Not on file  Intimate Partner Violence: Not on file    Family History  Problem Relation Age of Onset   Throat cancer Brother         & lung cancer   No Known Allergies I? Current Outpatient Medications  Medication Sig Dispense Refill   acetaminophen (TYLENOL) 500 MG tablet Take 1 tablet (500 mg total) by mouth every 6 (six) hours as needed. 30 tablet 0   apixaban (ELIQUIS) 5 MG TABS tablet Take 5 mg by mouth 2 (two) times daily.     clarithromycin (BIAXIN) 500 MG tablet Take 1 tablet (500 mg total) by mouth 2 (two) times daily. 60 tablet 1   doxycycline (ADOXA) 100 MG tablet Take 1 tablet (100 mg total) by mouth 2 (two) times daily. 60 tablet 0   furosemide (LASIX) 20 MG tablet Take 1 tablet by mouth once daily 20 tablet 0   gentamicin ointment (GARAMYCIN) 0.1 % Apply 1 Application topically 3 (three) times daily. 15 g 0   hydroxypropyl methylcellulose / hypromellose (ISOPTO TEARS / GONIOVISC) 2.5 % ophthalmic solution Place 1 drop into the left eye daily as needed for dry eyes.     levofloxacin (LEVAQUIN) 500 MG tablet Take 1 tablet (500 mg total) by mouth daily. 30 tablet 0   lidocaine-prilocaine (EMLA) cream Apply 1 Application topically as needed. Apply small amount to port site at least 1 hour prior to it being accessed, cover with plastic wrap 30 g 1   Multiple Vitamin (MULTIVITAMIN) tablet Take 1 tablet by mouth daily.     pravastatin (PRAVACHOL) 80 MG tablet Take 80 mg by mouth at bedtime.      saw palmetto  160 MG capsule Take 160 mg by mouth 2 (two) times daily.     traZODone (DESYREL) 50 MG tablet Take 1 tablet (50 mg total) by mouth at bedtime as needed for sleep. 30 tablet 3   No current facility-administered medications for this visit.   Facility-Administered Medications Ordered in Other Visits  Medication Dose Route Frequency Provider Last Rate Last Admin   heparin lock flush 100 UNIT/ML injection            heparin lock flush 100 unit/mL  500 Units Intravenous Once [name], MD       sodium chloride  flush (NS) 0.9 % injection 10 mL  10 mL Intravenous PRN [name], MD   10 mL at 02/11/21 0835     Abtx:  Anti-infectives (From admission, onward)    None       REVIEW OF SYSTEMS:  Const: negative fever, negative chills, + weight loss Eyes: negative diplopia or visual changes, negative eye pain ENT: negative coryza, negative sore throat Resp: + cough, +dyspnea Cards: negative for chest pain, palpitations, has lower extremity edema GU: negative for frequency, dysuria and hematuria GI: poor appetitie Skin: multiple skin nodules Heme: negative for easy bruising and gum/nose bleeding MS: no energy Neurolo:negative for headaches, dizziness, vertigo, memory problems  Psych:  anxiety,  Endocrine: negative for thyroid, diabetes Allergy/Immunology- negative for any medication or food allergies  Objective:  VITALS:  BP 111/73   Pulse 87   Temp (!) 97.4 F (36.3 C) (Temporal)   Wt 160 lb (72.6 kg)   BMI 21.70 kg/m   PHYSICAL EXAM:  General: Alert, cooperative, no distress,  Hard of hearing Head: scalp - vertex covered with dressing. There is a bruise next to it Lungs: b/l air entry Decreased bases Heart: regular Abdomen: did not examine Extremities: b/l leg edema improved Skin: muliple violaceous nodules over skin- healing now-  No new lesions          Lymph: Cervical, supraclavicular normal. Neurologic: Grossly non-focal Pertinent  Labs Lab Results CBC    Component Value Date/Time   WBC 9.1 06/08/2022 1017   RBC 4.02 (L) 06/08/2022 1017   HGB 12.4 (L) 06/08/2022 1017   HCT 38.4 (L) 06/08/2022 1017   PLT 458 (H) 06/08/2022 1017   MCV 95.5 06/08/2022 1017   MCH 30.8 06/08/2022 1017   MCHC 32.3 06/08/2022 1017   RDW 15.1 06/08/2022 1017   LYMPHSABS 1.6 06/08/2022 1017   MONOABS 0.8 06/08/2022 1017   EOSABS 0.1 06/08/2022 1017   BASOSABS 0.1 06/08/2022 1017       Latest Ref Rng & Units 06/08/2022   10:17 AM 05/14/2022   10:27 AM 05/11/2022   10:47 AM  CMP  Glucose 70 - 99 mg/dL 99  104    BUN 8 - 23 mg/dL 18  15    Creatinine 0.61 - 1.24 mg/dL 1.31  0.81  1.00   Sodium 135 - 145 mmol/L 133  134    Potassium 3.5 - 5.1 mmol/L 5.3  4.0    Chloride 98 - 111 mmol/L 98  98    CO2 22 - 32 mmol/L 25  24    Calcium 8.9 - 10.3 mg/dL 9.2  8.6    Total Protein 6.5 - 8.1 g/dL 7.4  6.6    Total Bilirubin 0.3 - 1.2 mg/dL 1.0  0.6    Alkaline Phos 38 - 126 U/L 198  170    AST 15 - 41 U/L 25  24    ALT 0 - 44 U/L 16  14        Microbiology: No results found for this or any previous visit (from the past 240 hour(s)).  IMAGING RESULTS:  I have personally reviewed the films ?Newly enlarged posterior right tracheoesophageal groove mediastinal lymph node, compatible with recurrent nodal metastasis. . 2. Stable left perihilar radiation fibrosis without local tumor recurrence. Stable small dependent left pleural effusion.   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

## 2022-07-01 LAB — ACID FAST CULTURE WITH REFLEXED SENSITIVITIES (MYCOBACTERIA): Acid Fast Culture: NEGATIVE

## 2022-07-09 ENCOUNTER — Inpatient Hospital Stay: Payer: Medicare Other

## 2022-07-09 ENCOUNTER — Encounter: Payer: Self-pay | Admitting: Internal Medicine

## 2022-07-09 ENCOUNTER — Inpatient Hospital Stay: Payer: Medicare Other | Attending: Internal Medicine | Admitting: Internal Medicine

## 2022-07-09 VITALS — BP 95/59 | HR 81 | Temp 96.0°F | Resp 18 | Ht 72.0 in | Wt 156.8 lb

## 2022-07-09 DIAGNOSIS — A319 Mycobacterial infection, unspecified: Secondary | ICD-10-CM | POA: Diagnosis not present

## 2022-07-09 DIAGNOSIS — Z23 Encounter for immunization: Secondary | ICD-10-CM

## 2022-07-09 DIAGNOSIS — C7931 Secondary malignant neoplasm of brain: Secondary | ICD-10-CM | POA: Diagnosis present

## 2022-07-09 DIAGNOSIS — C3492 Malignant neoplasm of unspecified part of left bronchus or lung: Secondary | ICD-10-CM

## 2022-07-09 DIAGNOSIS — I4891 Unspecified atrial fibrillation: Secondary | ICD-10-CM | POA: Diagnosis not present

## 2022-07-09 DIAGNOSIS — Z79899 Other long term (current) drug therapy: Secondary | ICD-10-CM | POA: Diagnosis not present

## 2022-07-09 DIAGNOSIS — C3432 Malignant neoplasm of lower lobe, left bronchus or lung: Secondary | ICD-10-CM | POA: Insufficient documentation

## 2022-07-09 DIAGNOSIS — G4700 Insomnia, unspecified: Secondary | ICD-10-CM | POA: Insufficient documentation

## 2022-07-09 DIAGNOSIS — Z87891 Personal history of nicotine dependence: Secondary | ICD-10-CM | POA: Diagnosis not present

## 2022-07-09 DIAGNOSIS — R110 Nausea: Secondary | ICD-10-CM | POA: Insufficient documentation

## 2022-07-09 DIAGNOSIS — Z7901 Long term (current) use of anticoagulants: Secondary | ICD-10-CM | POA: Insufficient documentation

## 2022-07-09 DIAGNOSIS — K5900 Constipation, unspecified: Secondary | ICD-10-CM | POA: Diagnosis not present

## 2022-07-09 DIAGNOSIS — Z5112 Encounter for antineoplastic immunotherapy: Secondary | ICD-10-CM | POA: Diagnosis present

## 2022-07-09 LAB — CBC WITH DIFFERENTIAL/PLATELET
Abs Immature Granulocytes: 0.03 10*3/uL (ref 0.00–0.07)
Basophils Absolute: 0 10*3/uL (ref 0.0–0.1)
Basophils Relative: 0 %
Eosinophils Absolute: 0.1 10*3/uL (ref 0.0–0.5)
Eosinophils Relative: 1 %
HCT: 38 % — ABNORMAL LOW (ref 39.0–52.0)
Hemoglobin: 12.6 g/dL — ABNORMAL LOW (ref 13.0–17.0)
Immature Granulocytes: 1 %
Lymphocytes Relative: 20 %
Lymphs Abs: 1.3 10*3/uL (ref 0.7–4.0)
MCH: 31.4 pg (ref 26.0–34.0)
MCHC: 33.2 g/dL (ref 30.0–36.0)
MCV: 94.8 fL (ref 80.0–100.0)
Monocytes Absolute: 0.7 10*3/uL (ref 0.1–1.0)
Monocytes Relative: 10 %
Neutro Abs: 4.3 10*3/uL (ref 1.7–7.7)
Neutrophils Relative %: 68 %
Platelets: 265 10*3/uL (ref 150–400)
RBC: 4.01 MIL/uL — ABNORMAL LOW (ref 4.22–5.81)
RDW: 15.5 % (ref 11.5–15.5)
WBC: 6.4 10*3/uL (ref 4.0–10.5)
nRBC: 0 % (ref 0.0–0.2)

## 2022-07-09 LAB — TSH: TSH: 5.873 u[IU]/mL — ABNORMAL HIGH (ref 0.350–4.500)

## 2022-07-09 LAB — COMPREHENSIVE METABOLIC PANEL
ALT: 13 U/L (ref 0–44)
AST: 23 U/L (ref 15–41)
Albumin: 3.7 g/dL (ref 3.5–5.0)
Alkaline Phosphatase: 170 U/L — ABNORMAL HIGH (ref 38–126)
Anion gap: 7 (ref 5–15)
BUN: 20 mg/dL (ref 8–23)
CO2: 25 mmol/L (ref 22–32)
Calcium: 8.8 mg/dL — ABNORMAL LOW (ref 8.9–10.3)
Chloride: 101 mmol/L (ref 98–111)
Creatinine, Ser: 0.88 mg/dL (ref 0.61–1.24)
GFR, Estimated: >60 mL/min (ref >60)
Glucose, Bld: 113 mg/dL — ABNORMAL HIGH (ref 70–99)
Potassium: 4 mmol/L (ref 3.5–5.1)
Sodium: 133 mmol/L — ABNORMAL LOW (ref 135–145)
Total Bilirubin: 0.8 mg/dL (ref 0.3–1.2)
Total Protein: 6.7 g/dL (ref 6.5–8.1)

## 2022-07-09 MED ORDER — SODIUM CHLORIDE 0.9% FLUSH
10.0000 mL | INTRAVENOUS | Status: DC | PRN
  Administered 2022-07-09: 10 mL
  Filled 2022-07-09: qty 10

## 2022-07-09 MED ORDER — INFLUENZA VAC A&B SA ADJ QUAD 0.5 ML IM PRSY
0.5000 mL | PREFILLED_SYRINGE | Freq: Once | INTRAMUSCULAR | Status: AC
  Administered 2022-07-09: 0.5 mL via INTRAMUSCULAR
  Filled 2022-07-09: qty 0.5

## 2022-07-09 MED ORDER — SODIUM CHLORIDE 0.9 % IV SOLN
200.0000 mg | Freq: Once | INTRAVENOUS | Status: AC
  Administered 2022-07-09: 200 mg via INTRAVENOUS
  Filled 2022-07-09: qty 8

## 2022-07-09 MED ORDER — HEPARIN SOD (PORK) LOCK FLUSH 100 UNIT/ML IV SOLN
500.0000 [IU] | Freq: Once | INTRAVENOUS | Status: AC | PRN
  Administered 2022-07-09: 500 [IU]
  Filled 2022-07-09: qty 5

## 2022-07-09 MED ORDER — SODIUM CHLORIDE 0.9 % IV SOLN
Freq: Once | INTRAVENOUS | Status: AC
  Filled 2022-07-09: qty 250

## 2022-07-09 NOTE — Progress Notes (Signed)
Taking Furosemide 20 mg QD and needs a refill if he is to continue.    No appetite, 4 lb wt loss, with difficulty swallowing solids and coughs during/after eating.  While eating he has a lot of "air" in abdomen.  BP 95/59, HR 81.

## 2022-07-09 NOTE — Assessment & Plan Note (Addendum)
#  Stage IV- -non-small cell lung cancer-[favor adenocarcinoma] [at dx-stage III- s/p  concurrent chemoradiation; s/p  adjuvant durvalumab; finished Feb 2023]; with metachronous brain metastases [March 2023];   AUG-2023-CT/PET scan-  Tracheal esophageal lymph node displays moderate increased metabolic activity in signs of central necrosis compatible with metastatic lymph node. Signs of bony metastasis to the LEFT olecranon.   # Given recurrent i disease in the tracheoesophageal groove-proceed with Keytruda  [PD-L1-90 percent].  We will plan imaging after 3-4 treatments.   # Brain metastases--s/p SBRT [March, 23rrd-2023] AUG 2023- Continued interval decrease in size of a peripherally enhancing right frontal lobe metastasis, now measuring 9 x 8 mm. Surrounding edema persists, but has also decreased.  Monitor for now.  We will again repeat imaging in 1 to 2 months.  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   # insomnia- Bil LE swelling: sec to steroids-Tapered off today.  # A. fib on Eliquis [Dr.Kowalski]-  Mild swelling in legs- EF in Feb 2022- 55-60%- discussed re: lasix; declines.   # Right hip fracture status post ORIF.  Nonpathologic. On  physical therapy- STABLE.  # MediPort flush q 6 W-stable.  # Vaccination- flu shots today; covid later  # DISPOSITION: # flu shot today # Keytruda today # follow up in 3 weeks/Oct 30th- MD; labs- cbc/cmp; Shanda HowellsBeryle Flock ]Dr.B

## 2022-07-09 NOTE — Patient Instructions (Signed)

## 2022-07-09 NOTE — Progress Notes (Signed)
[place] NOTE  Patient Care Team: Baxter Hire, MD as PCP - General (Internal Medicine) [name], RN as Oncology Nurse Navigator [name], MD as Consulting Physician (Hematology and Oncology) [name], RN as Registered Nurse (Oncology)  CHIEF COMPLAINTS/PURPOSE OF CONSULTATION: Lung cancer  #  Oncology History Overview Note  # NOV 2021- LEFT LOWER LOBE NON-SMALL CELL CA [favor adeno ca]; T2N3 [right hilar; subcarinal LN;[name]; NOV 2021-MRI [place]  # 11/30- carbo-Taxol-RT [RT until 10/23/20]; s/p IMFINZI on 2/24. [72 months]; finished February 2023.  # #Brain metastases-March 2023-MRI-brain 2.1 cm peripherally enhancing lesion in the right paracentral lobule concerning for metastatic disease. There is mild perilesional edema and regional mass effect but no midline shift. On dexamethasone 2 mg a day.  Noted to have significant movement of the left lower extremity weakness.  Plan with SBRT on 3/22.  # SEP 20th, 2023- Keytruda [PDL-1:90%]  # Right lower lobe - ~4.6 x 3.1 cm  [PET 07-2020]- demonstrates no significant hypermetabolic activity (SUV max 1.8]-benign. STABLE.     # A.fib [Eliquis; Dr.Kowalski]  # # SURVIVORSHIP:   # GENETICS:   #NGS-ordered  DIAGNOSIS: Lung cancer  STAGE:   III      ;  GOALS:  cure  CURRENT/MOST RECENT THERAPY : carbo-Taxol-RT ]   Stage 4 lung cancer, left ([place])  08/14/2020 Initial Diagnosis   Cancer of lower lobe of left lung ([place])   09/02/2020 - 10/22/2020 Chemotherapy    Patient is on Treatment Plan: LUNG DURVALUMAB Q14D      10/16/2020 Cancer Staging   Staging form: Lung, AJCC 8th Edition - Clinical: Stage IIIB (cT2a, cN3, cM0) - Signed by [name], MD on 10/16/2020   12/17/2020 - 11/27/2021 Chemotherapy   Patient is on Treatment Plan : LUNG Durvalumab q14d     07/09/2022 -  Chemotherapy   Patient is on Treatment Plan : LUNG NSCLC Pembrolizumab (200) q21d       HISTORY OF PRESENTING ILLNESS: Patient i ambulating with the rolling walker.  He is accompanied by his wife.   Jason Warren 88 y.o.  male patient with currently stage IV [metachronus stage III lung non-small cell lung; s/p treatment feb 2023 with Brain mets status post [facility] MARCH 2023 is here for a follow up/proceed with immunotherapy.  In the interim patient followed up with Dr. Ethelle Lyon regarding atypical mycobacterial infection of the extremities.  Currently on antibiotics.  Notes her improvement of the skin lesions not resolved.  Complains of ongoing fatigue.  Mild cough;  not significantly worse.  Continues to have mild difficulty swallowing mostly solids.  Admits to nausea from his antibiotics.  Otherwise no falls.  He continues to go around in a rolling walker.  Review of Systems  Constitutional:  Positive for malaise/fatigue. Negative for chills, diaphoresis, fever and weight loss.  HENT:  Negative for nosebleeds and sore throat.   Eyes:  Negative for double vision.  Respiratory:  Negative for hemoptysis, sputum production, shortness of breath and wheezing.   Cardiovascular:  Positive for leg swelling. Negative for chest pain, palpitations and orthopnea.  Gastrointestinal:  Negative for abdominal pain, blood in stool, constipation, diarrhea, heartburn, melena, nausea and vomiting.  Genitourinary:  Negative for dysuria and urgency.  Musculoskeletal:  Negative for back pain and joint pain.  Skin:  Negative for itching.  Neurological:  Positive for focal weakness. Negative for tingling, weakness and headaches.  Endo/Heme/Allergies:  Does not bruise/bleed easily.  Psychiatric/Behavioral:  Negative  for depression. The patient is not nervous/anxious and does not have insomnia.      MEDICAL HISTORY:  Past Medical History:  Diagnosis Date   Arthritis    Dysrhythmia    A-fib   Hip pain    left   HOH (hard of hearing)    LBBB (left bundle branch block) 08/05/2020   Lung cancer  ([place])    Skin cancer, basal cell     face top of head    SURGICAL HISTORY: Past Surgical History:  Procedure Laterality Date   CATARACT EXTRACTION W/PHACO Right 07/13/2017   Procedure: CATARACT EXTRACTION PHACO AND INTRAOCULAR LENS PLACEMENT ([place]);  Surgeon: Leandrew Koyanagi, MD;  Location: [facility];  Service: Ophthalmology;  Laterality: Right;  IVA TOPICAL RIGHT   CATARACT EXTRACTION W/PHACO Left 01/04/2018   Procedure: CATARACT EXTRACTION PHACO AND INTRAOCULAR LENS PLACEMENT ([place]) LEFT;  Surgeon: Leandrew Koyanagi, MD;  Location: [facility];  Service: Ophthalmology;  Laterality: Left;   COLONOSCOPY     FEMUR FRACTURE SURGERY Right 01/03/2022   INTRAMEDULLARY (IM) NAIL INTERTROCHANTERIC Right 01/03/2022   Procedure: INTRAMEDULLARY (IM) NAIL INTERTROCHANTRIC;  Surgeon: Corky Mull, MD;  Location: [facility];  Service: Orthopedics;  Laterality: Right;   IR IMAGING GUIDED PORT INSERTION  08/27/2020   JOINT REPLACEMENT     Left total hip Dr. Su Hoff 08-04-18   MOHS SURGERY  06/01/2022   ROTATOR CUFF REPAIR Right    SKIN CANCER EXCISION     face   TOTAL HIP ARTHROPLASTY Left 08/04/2018   Procedure: TOTAL HIP ARTHROPLASTY ANTERIOR APPROACH;  Surgeon: Frederik Pear, MD;  Location: [facility];  Service: Orthopedics;  Laterality: Left;   VIDEO BRONCHOSCOPY WITH ENDOBRONCHIAL NAVIGATION N/A 08/07/2020   Procedure: VIDEO BRONCHOSCOPY WITH ENDOBRONCHIAL NAVIGATION;  Surgeon: Ottie Glazier, MD;  Location: [facility];  Service: Thoracic;  Laterality: N/A;   VIDEO BRONCHOSCOPY WITH ENDOBRONCHIAL ULTRASOUND N/A 08/07/2020   Procedure: VIDEO BRONCHOSCOPY WITH ENDOBRONCHIAL ULTRASOUND;  Surgeon: Ottie Glazier, MD;  Location: [facility];  Service: Thoracic;  Laterality: N/A;    SOCIAL HISTORY: Social History   Socioeconomic History   Marital status: Married    Spouse name: Not on file   Number of children: Not on file   Years of education: Not on file   Highest  education level: Not on file  Occupational History   Not on file  Tobacco Use   Smoking status: Former    Packs/day: 1.00    Years: 4.00    Total pack years: 4.00    Types: Cigarettes   Smokeless tobacco: Never   Tobacco comments:    quit early 70's  Vaping Use   Vaping Use: Never used  Substance and Sexual Activity   Alcohol use: No   Drug use: No   Sexual activity: Not Currently  Other Topics Concern   Not on file  Social History Narrative   > quit 35 years; smoked for 15 years. Rare alcohol. 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    Social Determinants of Health   Financial Resource Strain: Not on file  Food Insecurity: Not on file  Transportation Needs: Not on file  Physical Activity: Not on file  Stress: Not on file  Social Connections: Not on file  Intimate Partner Violence: Not on file    FAMILY HISTORY: Family History  Problem Relation Age of Onset   Throat cancer Brother         &  lung cancer    ALLERGIES:  has No Known Allergies.  MEDICATIONS:  Current Outpatient Medications  Medication Sig Dispense Refill   acetaminophen (TYLENOL) 500 MG tablet Take 1 tablet (500 mg total) by mouth every 6 (six) hours as needed. 30 tablet 0   apixaban (ELIQUIS) 5 MG TABS tablet Take 5 mg by mouth 2 (two) times daily.     clarithromycin (BIAXIN) 500 MG tablet Take 1 tablet (500 mg total) by mouth 2 (two) times daily. 60 tablet 2   doxycycline (ADOXA) 100 MG tablet Take 1 tablet (100 mg total) by mouth 2 (two) times daily. 60 tablet 2   furosemide (LASIX) 20 MG tablet Take 1 tablet by mouth once daily 20 tablet 0   gentamicin ointment (GARAMYCIN) 0.1 % Apply 1 Application topically 3 (three) times daily. 15 g 0   hydroxypropyl methylcellulose / hypromellose (ISOPTO TEARS / GONIOVISC) 2.5 % ophthalmic solution Place 1 drop into the left eye daily as needed for dry eyes.     levofloxacin (LEVAQUIN) 500 MG  tablet Take 1 tablet (500 mg total) by mouth daily. 30 tablet 2   lidocaine-prilocaine (EMLA) cream Apply 1 Application topically as needed. Apply small amount to port site at least 1 hour prior to it being accessed, cover with plastic wrap 30 g 1   pravastatin (PRAVACHOL) 80 MG tablet Take 80 mg by mouth at bedtime.      traZODone (DESYREL) 50 MG tablet Take 1 tablet (50 mg total) by mouth at bedtime as needed for sleep. 30 tablet 3   Multiple Vitamin (MULTIVITAMIN) tablet Take 1 tablet by mouth daily. (Patient not taking: Reported on 07/09/2022)     saw palmetto 160 MG capsule Take 160 mg by mouth 2 (two) times daily. (Patient not taking: Reported on 07/09/2022)     No current facility-administered medications for this visit.   Facility-Administered Medications Ordered in Other Visits  Medication Dose Route Frequency Provider Last Rate Last Admin   heparin lock flush 100 UNIT/ML injection            heparin lock flush 100 unit/mL  500 Units Intravenous Once Charlaine Dalton R, MD       sodium chloride flush (NS) 0.9 % injection 10 mL  10 mL Intravenous PRN [name], MD   10 mL at 02/11/21 0835   PHYSICAL EXAMINATION: ECOG PERFORMANCE STATUS: 1 - Symptomatic but completely ambulatory  Vitals:   07/09/22 1000  BP: (!) 95/59  Pulse: 81  Resp: 18  Temp: (!) 96 F (35.6 C)  SpO2: 100%   Filed Weights   07/09/22 1000  Weight: 156 lb 12.8 oz (71.1 kg)   Bilateral upper lower extremity boils noted.  Physical Exam HENT:     Head: Normocephalic and atraumatic.     Mouth/Throat:     Pharynx: No oropharyngeal exudate.  Eyes:     Pupils: Pupils are equal, round, and reactive to light.  Cardiovascular:     Rate and Rhythm: Normal rate and regular rhythm.  Pulmonary:     Effort: Pulmonary effort is normal. No respiratory distress.     Breath sounds: No wheezing.     Comments: Slightly decreased breath sounds left lower lung base. Abdominal:     General: Bowel sounds are  normal. There is no distension.     Palpations: Abdomen is soft. There is no mass.     Tenderness: There is no abdominal tenderness. There is no guarding or rebound.  Musculoskeletal:  General: No tenderness. Normal range of motion.     Cervical back: Normal range of motion and neck supple.  Skin:    General: Skin is warm.  Neurological:     Mental Status: He is alert and oriented to person, place, and time.  Psychiatric:        Mood and Affect: Affect normal.      LABORATORY DATA:  I have reviewed the data as listed Lab Results  Component Value Date   WBC 6.4 07/09/2022   HGB 12.6 (L) 07/09/2022   HCT 38.0 (L) 07/09/2022   MCV 94.8 07/09/2022   PLT 265 07/09/2022   Recent Labs    05/14/22 1027 06/08/22 1017 07/09/22 0956  NA 134* 133* 133*  K 4.0 5.3* 4.0  CL 98 98 101  CO2 _0 GLUCOSE 104* 99 113*  BUN _1 CREATININE 0.81 1.31* 0.88  CALCIUM 8.6* 9.2 8.8*  GFRNONAA >60 52* >60  PROT 6.6 7.4 6.7  ALBUMIN 3.4* 3.7 3.7  AST _2 ALT _3 ALKPHOS 170* 198* 170*  BILITOT 0.6 1.0 0.8    RADIOGRAPHIC STUDIES: I have personally reviewed the radiological images as listed and agreed with the findings in the report. No results found.   ASSESSMENT & PLAN:   Stage 4 lung cancer, left ([place]) #Stage IV- -non-small cell lung cancer-[favor adenocarcinoma] [at dx-stage III- s/p  concurrent chemoradiation; s/p  adjuvant durvalumab; finished Feb 2023]; with metachronous brain metastases [March 2023];   AUG-2023-CT/PET scan-  Tracheal esophageal lymph node displays moderate increased metabolic activity in signs of central necrosis compatible with metastatic lymph node. Signs of bony metastasis to the LEFT olecranon.   # Given recurrent i disease in the tracheoesophageal groove-proceed with Keytruda  [PD-L1-90 percent].  We will plan imaging after 3-4 treatments.   # Brain metastases--s/p SBRT [March, 23rrd-2023] AUG 2023- Continued interval decrease  in size of a peripherally enhancing right frontal lobe metastasis, now measuring 9 x 8 mm. Surrounding edema persists, but has also decreased.  Monitor for now.  We will again repeat imaging in 1 to 2 months.  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   # insomnia- Bil LE swelling: sec to steroids-Tapered off today.  # A. fib on Eliquis [Dr.Kowalski]-  Mild swelling in legs- EF in Feb 2022- 55-60%- discussed re: lasix; declines.   # Right hip fracture status post ORIF.  Nonpathologic. On  physical therapy- STABLE.  # MediPort flush q 6 W-stable.  # Vaccination- flu shots today; covid later  # DISPOSITION: # flu shot today # Keytruda today # follow up in 3 weeks/Oct 30th- MD; labs- cbc/cmp; Shanda HowellsBeryle Flock ]Dr.B           All questions were answered. The patient knows to call the clinic with any problems, questions or concerns.   [name], MD 07/09/2022 11:00 AM

## 2022-07-10 LAB — T4: T4, Total: 6.2 ug/dL (ref 4.5–12.0)

## 2022-07-14 ENCOUNTER — Encounter: Payer: Self-pay | Admitting: Internal Medicine

## 2022-07-14 ENCOUNTER — Other Ambulatory Visit: Payer: Self-pay | Admitting: Internal Medicine

## 2022-07-14 MED ORDER — FUROSEMIDE 20 MG PO TABS: 20.0000 mg | ORAL_TABLET | Freq: Every day | ORAL | 3 refills | Status: DC

## 2022-07-19 ENCOUNTER — Encounter: Payer: Self-pay | Admitting: Internal Medicine

## 2022-07-19 ENCOUNTER — Other Ambulatory Visit: Payer: Self-pay

## 2022-07-19 ENCOUNTER — Encounter: Payer: Self-pay | Admitting: Infectious Diseases

## 2022-07-19 MED ORDER — ONDANSETRON HCL 8 MG PO TABS: ORAL_TABLET | ORAL | 1 refills | Status: DC

## 2022-07-22 ENCOUNTER — Encounter: Payer: Self-pay | Admitting: Internal Medicine

## 2022-07-22 ENCOUNTER — Encounter: Payer: Self-pay | Admitting: Infectious Diseases

## 2022-07-23 ENCOUNTER — Inpatient Hospital Stay (HOSPITAL_BASED_OUTPATIENT_CLINIC_OR_DEPARTMENT_OTHER): Payer: Medicare Other | Admitting: Hospice and Palliative Medicine

## 2022-07-23 ENCOUNTER — Inpatient Hospital Stay: Payer: Medicare Other

## 2022-07-23 ENCOUNTER — Other Ambulatory Visit: Payer: Self-pay

## 2022-07-23 ENCOUNTER — Telehealth: Payer: Self-pay | Admitting: *Deleted

## 2022-07-23 ENCOUNTER — Encounter: Payer: Self-pay | Admitting: Hospice and Palliative Medicine

## 2022-07-23 ENCOUNTER — Encounter: Payer: Self-pay | Admitting: Infectious Diseases

## 2022-07-23 VITALS — BP 101/62 | HR 77 | Temp 97.6°F | Resp 18 | Ht 72.0 in | Wt 156.0 lb

## 2022-07-23 DIAGNOSIS — R110 Nausea: Secondary | ICD-10-CM | POA: Diagnosis not present

## 2022-07-23 DIAGNOSIS — Z95828 Presence of other vascular implants and grafts: Secondary | ICD-10-CM

## 2022-07-23 DIAGNOSIS — C3492 Malignant neoplasm of unspecified part of left bronchus or lung: Secondary | ICD-10-CM

## 2022-07-23 DIAGNOSIS — Z5112 Encounter for antineoplastic immunotherapy: Secondary | ICD-10-CM | POA: Diagnosis not present

## 2022-07-23 DIAGNOSIS — R5383 Other fatigue: Secondary | ICD-10-CM

## 2022-07-23 LAB — CBC WITH DIFFERENTIAL/PLATELET
Abs Immature Granulocytes: 0.06 10*3/uL (ref 0.00–0.07)
Basophils Absolute: 0 10*3/uL (ref 0.0–0.1)
Basophils Relative: 0 %
Eosinophils Absolute: 0.1 10*3/uL (ref 0.0–0.5)
Eosinophils Relative: 1 %
HCT: 39.1 % (ref 39.0–52.0)
Hemoglobin: 12.7 g/dL — ABNORMAL LOW (ref 13.0–17.0)
Immature Granulocytes: 1 %
Lymphocytes Relative: 16 %
Lymphs Abs: 1.2 10*3/uL (ref 0.7–4.0)
MCH: 31.1 pg (ref 26.0–34.0)
MCHC: 32.5 g/dL (ref 30.0–36.0)
MCV: 95.8 fL (ref 80.0–100.0)
Monocytes Absolute: 0.7 10*3/uL (ref 0.1–1.0)
Monocytes Relative: 9 %
Neutro Abs: 5.5 10*3/uL (ref 1.7–7.7)
Neutrophils Relative %: 73 %
Platelets: 297 10*3/uL (ref 150–400)
RBC: 4.08 MIL/uL — ABNORMAL LOW (ref 4.22–5.81)
RDW: 14.8 % (ref 11.5–15.5)
WBC: 7.6 10*3/uL (ref 4.0–10.5)
nRBC: 0 % (ref 0.0–0.2)

## 2022-07-23 LAB — COMPREHENSIVE METABOLIC PANEL
ALT: 12 U/L (ref 0–44)
AST: 23 U/L (ref 15–41)
Albumin: 3.5 g/dL (ref 3.5–5.0)
Alkaline Phosphatase: 147 U/L — ABNORMAL HIGH (ref 38–126)
Anion gap: 9 (ref 5–15)
BUN: 21 mg/dL (ref 8–23)
CO2: 26 mmol/L (ref 22–32)
Calcium: 8.5 mg/dL — ABNORMAL LOW (ref 8.9–10.3)
Chloride: 97 mmol/L — ABNORMAL LOW (ref 98–111)
Creatinine, Ser: 0.83 mg/dL (ref 0.61–1.24)
GFR, Estimated: >60 mL/min (ref >60)
Glucose, Bld: 135 mg/dL — ABNORMAL HIGH (ref 70–99)
Potassium: 4 mmol/L (ref 3.5–5.1)
Sodium: 132 mmol/L — ABNORMAL LOW (ref 135–145)
Total Bilirubin: 0.7 mg/dL (ref 0.3–1.2)
Total Protein: 6.7 g/dL (ref 6.5–8.1)

## 2022-07-23 LAB — MAGNESIUM: Magnesium: 2.1 mg/dL (ref 1.7–2.4)

## 2022-07-23 MED ORDER — SODIUM CHLORIDE 0.9% FLUSH
10.0000 mL | Freq: Once | INTRAVENOUS | Status: AC
  Filled 2022-07-23: qty 10

## 2022-07-23 MED ORDER — PROCHLORPERAZINE MALEATE 10 MG PO TABS: 5.0000 mg | ORAL_TABLET | Freq: Four times a day (QID) | ORAL | 0 refills | Status: DC | PRN

## 2022-07-23 MED ORDER — DEXAMETHASONE SODIUM PHOSPHATE 10 MG/ML IJ SOLN
4.0000 mg | Freq: Once | INTRAMUSCULAR | Status: AC
  Administered 2022-07-23: 4 mg via INTRAVENOUS
  Filled 2022-07-23: qty 1

## 2022-07-23 MED ORDER — HEPARIN SOD (PORK) LOCK FLUSH 100 UNIT/ML IV SOLN
INTRAVENOUS | Status: AC
  Administered 2022-07-23: 500 [IU] via INTRAVENOUS
  Filled 2022-07-23: qty 5

## 2022-07-23 MED ORDER — SODIUM CHLORIDE 0.9 % IV SOLN
INTRAVENOUS | Status: DC
  Filled 2022-07-23 (×2): qty 250

## 2022-07-23 MED ORDER — ONDANSETRON HCL 4 MG/2ML IJ SOLN
8.0000 mg | Freq: Once | INTRAMUSCULAR | Status: AC
  Administered 2022-07-23: 8 mg via INTRAVENOUS
  Filled 2022-07-23: qty 4

## 2022-07-23 NOTE — Progress Notes (Signed)
Symptom Management [place] at [facility] [phone] [phone]  Patient Care Team: Baxter Hire, MD as PCP - General (Internal Medicine) [name], RN as Oncology Nurse Navigator [name], MD as Consulting Physician (Hematology and Oncology) [name], RN as Registered Nurse (Oncology)   NAME OF PATIENT: Jason Warren  [id number]  [birth year]   DATE OF VISIT: 07/23/22  REASON FOR CONSULT: Jason Warren is a 88 y.o. male with multiple medical problems including stage IV non-small cell lung cancer originally diagnosed November 2021 status post CarboTaxol RT.  Status post Imfinzi for 12 months.  Brain metastasis in March 2023 status post SBRT.  Patient most recently on treatment with Keytruda.  Patient is followed by ID for treatment of atypical mycobacterial skin infection -on treatment with Levaquin/Doxy/clarithromycin.  INTERVAL HISTORY: Patient presents to [facility] for evaluation of nausea.  Patient says he has had fairly persistent nausea since he started his antibiotic regimen.  He denies vomiting but does endorse poor oral intake.  He is drinking oral supplements but minimal food intake.  No diarrhea but he does have some mild constipation going up to 3 to 4 days without having a bowel movement.  Patient denies neurological changes including headaches, visual changes, confusion.  Denies recent fevers or illnesses. Denies any easy bleeding or bruising. Reports fair appetite and denies weight loss. Denies chest pain. Denies any nausea, vomiting, constipation, or diarrhea. Denies urinary complaints. Patient offers no further specific complaints today.   PAST MEDICAL HISTORY: Past Medical History:  Diagnosis Date   Arthritis    Dysrhythmia    A-fib   Hip pain    left   HOH (hard of hearing)    LBBB (left bundle branch block) 08/05/2020   Lung cancer ([place])    Skin cancer, basal cell     face top  of head    PAST SURGICAL HISTORY:  Past Surgical History:  Procedure Laterality Date   CATARACT EXTRACTION W/PHACO Right 07/13/2017   Procedure: CATARACT EXTRACTION PHACO AND INTRAOCULAR LENS PLACEMENT ([place]);  Surgeon: Leandrew Koyanagi, MD;  Location: [facility];  Service: Ophthalmology;  Laterality: Right;  IVA TOPICAL RIGHT   CATARACT EXTRACTION W/PHACO Left 01/04/2018   Procedure: CATARACT EXTRACTION PHACO AND INTRAOCULAR LENS PLACEMENT ([place]) LEFT;  Surgeon: Leandrew Koyanagi, MD;  Location: [facility];  Service: Ophthalmology;  Laterality: Left;   COLONOSCOPY     FEMUR FRACTURE SURGERY Right 01/03/2022   INTRAMEDULLARY (IM) NAIL INTERTROCHANTERIC Right 01/03/2022   Procedure: INTRAMEDULLARY (IM) NAIL INTERTROCHANTRIC;  Surgeon: Corky Mull, MD;  Location: [facility];  Service: Orthopedics;  Laterality: Right;   IR IMAGING GUIDED PORT INSERTION  08/27/2020   JOINT REPLACEMENT     Left total hip Dr. Su Hoff 08-04-18   MOHS SURGERY  06/01/2022   ROTATOR CUFF REPAIR Right    SKIN CANCER EXCISION     face   TOTAL HIP ARTHROPLASTY Left 08/04/2018   Procedure: TOTAL HIP ARTHROPLASTY ANTERIOR APPROACH;  Surgeon: Frederik Pear, MD;  [facility];  Service: Orthopedics;  Laterality: Left;   VIDEO BRONCHOSCOPY WITH ENDOBRONCHIAL NAVIGATION N/A 08/07/2020   Procedure: VIDEO BRONCHOSCOPY WITH ENDOBRONCHIAL NAVIGATION;  Surgeon: Ottie Glazier, MD;  Location: [facility];  Service: Thoracic;  Laterality: N/A;   VIDEO BRONCHOSCOPY WITH ENDOBRONCHIAL ULTRASOUND N/A 08/07/2020   Procedure: VIDEO BRONCHOSCOPY WITH ENDOBRONCHIAL ULTRASOUND;  Surgeon: Ottie Glazier, MD;  Location: [facility];  Service: Thoracic;  Laterality: N/A;    HEMATOLOGY/ONCOLOGY  HISTORY:  Oncology History Overview Note  # NOV 2021- LEFT LOWER LOBE NON-SMALL CELL CA [favor adeno ca]; T2N3 [right hilar; subcarinal LN;[name]; NOV 2021-MRI Brain-NEG  # 11/30- carbo-Taxol-RT [RT until 10/23/20];  s/p IMFINZI on 2/24. [83 months]; finished February 2023.  # #Brain metastases-March 2023-MRI-brain 2.1 cm peripherally enhancing lesion in the right paracentral lobule concerning for metastatic disease. There is mild perilesional edema and regional mass effect but no midline shift. On dexamethasone 2 mg a day.  Noted to have significant movement of the left lower extremity weakness.  Plan with SBRT on 3/22.  # SEP 20th, 2023- Keytruda [PDL-1:90%]  # Right lower lobe - ~4.6 x 3.1 cm  [PET 07-2020]- demonstrates no significant hypermetabolic activity (SUV max 1.8]-benign. STABLE.   # OCT [id number] Flock- disease in the tracheoesophageal groove-proceed with Keytruda  [PD-L1-90 percent]  # A.fib [Eliquis; Dr.Kowalski]  # # SURVIVORSHIP:   # GENETICS:   #NGS-ordered  DIAGNOSIS: Lung cancer  STAGE:   III      ;  GOALS:  cure  CURRENT/MOST RECENT THERAPY : carbo-Taxol-RT ]   Stage 4 lung cancer, left ([place])  08/14/2020 Initial Diagnosis   Cancer of lower lobe of left lung ([place])   09/02/2020 - 10/22/2020 Chemotherapy    Patient is on Treatment Plan: LUNG DURVALUMAB Q14D      10/16/2020 Cancer Staging   Staging form: Lung, AJCC 8th Edition - Clinical: Stage IIIB (cT2a, cN3, cM0) - Signed by [name], MD on 10/16/2020   12/17/2020 - 11/27/2021 Chemotherapy   Patient is on Treatment Plan : LUNG Durvalumab q14d     07/09/2022 -  Chemotherapy   Patient is on Treatment Plan : LUNG NSCLC Pembrolizumab (200) q21d       ALLERGIES:  has No Known Allergies.  MEDICATIONS:  Current Outpatient Medications  Medication Sig Dispense Refill   acetaminophen (TYLENOL) 500 MG tablet Take 1 tablet (500 mg total) by mouth every 6 (six) hours as needed. 30 tablet 0   apixaban (ELIQUIS) 5 MG TABS tablet Take 5 mg by mouth 2 (two) times daily.     clarithromycin (BIAXIN) 500 MG tablet Take 1 tablet (500 mg total) by mouth 2 (two) times daily. 60 tablet 2   doxycycline (ADOXA) 100 MG  tablet Take 1 tablet (100 mg total) by mouth 2 (two) times daily. 60 tablet 2   furosemide (LASIX) 20 MG tablet Take 1 tablet (20 mg total) by mouth daily. 30 tablet 3   gentamicin ointment (GARAMYCIN) 0.1 % Apply 1 Application topically 3 (three) times daily. 15 g 0   hydroxypropyl methylcellulose / hypromellose (ISOPTO TEARS / GONIOVISC) 2.5 % ophthalmic solution Place 1 drop into the left eye daily as needed for dry eyes.     levofloxacin (LEVAQUIN) 500 MG tablet Take 1 tablet (500 mg total) by mouth daily. 30 tablet 2   lidocaine-prilocaine (EMLA) cream Apply 1 Application topically as needed. Apply small amount to port site at least 1 hour prior to it being accessed, cover with plastic wrap 30 g 1   Multiple Vitamin (MULTIVITAMIN) tablet Take 1 tablet by mouth daily. (Patient not taking: Reported on 07/09/2022)     ondansetron (ZOFRAN) 8 MG tablet One pill every 8 hours as needed for nausea/vomitting. 40 tablet 1   pravastatin (PRAVACHOL) 80 MG tablet Take 80 mg by mouth at bedtime.      saw palmetto 160 MG capsule Take 160 mg by mouth 2 (two) times daily. (Patient not taking:  Reported on 07/09/2022)     traZODone (DESYREL) 50 MG tablet Take 1 tablet (50 mg total) by mouth at bedtime as needed for sleep. 30 tablet 3   No current facility-administered medications for this visit.   Facility-Administered Medications Ordered in Other Visits  Medication Dose Route Frequency Provider Last Rate Last Admin   0.9 %  sodium chloride infusion   Intravenous Continuous Kyrese Gartman, Kirt Boys, NP 999 mL/hr at 07/23/22 1325 New Bag at 07/23/22 1325   heparin lock flush 100 UNIT/ML injection            heparin lock flush 100 unit/mL  500 Units Intravenous Once [name], MD       ondansetron [facility]) injection 8 mg  8 mg Intravenous Once Duaine Radin, Vonna Kotyk R, NP       sodium chloride flush (NS) 0.9 % injection 10 mL  10 mL Intravenous PRN Charlaine Dalton R, MD   10 mL at 02/11/21 0835   sodium  chloride flush (NS) 0.9 % injection 10 mL  10 mL Intravenous Once Brayton Baumgartner, Kirt Boys, NP        VITAL SIGNS: There were no vitals taken for this visit. There were no vitals filed for this visit.  Estimated body mass index is 21.27 kg/m as calculated from the following:   Height as of 07/09/22: 6' (1.829 m).   Weight as of 07/09/22: 156 lb 12.8 oz (71.1 kg).  LABS: CBC:    Component Value Date/Time   WBC 7.6 07/23/2022 1312   HGB 12.7 (L) 07/23/2022 1312   HCT 39.1 07/23/2022 1312   PLT 297 07/23/2022 1312   MCV 95.8 07/23/2022 1312   NEUTROABS 5.5 07/23/2022 1312   LYMPHSABS 1.2 07/23/2022 1312   MONOABS 0.7 07/23/2022 1312   EOSABS 0.1 07/23/2022 1312   BASOSABS 0.0 07/23/2022 1312   Comprehensive Metabolic Panel:    Component Value Date/Time   NA 133 (L) 07/09/2022 0956   K 4.0 07/09/2022 0956   CL 101 07/09/2022 0956   CO2 25 07/09/2022 0956   BUN 20 07/09/2022 0956   CREATININE 0.88 07/09/2022 0956   GLUCOSE 113 (H) 07/09/2022 0956   CALCIUM 8.8 (L) 07/09/2022 0956   AST 23 07/09/2022 0956   ALT 13 07/09/2022 0956   ALKPHOS 170 (H) 07/09/2022 0956   BILITOT 0.8 07/09/2022 0956   PROT 6.7 07/09/2022 0956   ALBUMIN 3.7 07/09/2022 0956    RADIOGRAPHIC STUDIES: No results found.  PERFORMANCE STATUS (ECOG) : 2 - Symptomatic, <50% confined to bed  Review of Systems Unless otherwise noted, a complete review of systems is negative.  Physical Exam General: NAD Cardiovascular: regular rate and rhythm Pulmonary: clear ant fields Abdomen: soft, nontender, + bowel sounds GU: no suprapubic tenderness Extremities: no edema, no joint deformities Skin: Multiple raised, erythematous, nondraining lesions to legs/arms Neurological: Weakness but otherwise nonfocal   IMPRESSION/PLAN: Nausea without vomiting -could be multifactorial with antibiotic regimen/cancer treatment.  No significant neurological changes that would suggest worsening brain metastasis.  Patient is  taking Zofran every 8 hours with only mild improvement.  We will start prochlorperazine as needed.  Proceed with IV fluids and will give IV dexamethasone x1.  Mycobacterial skin lesions -on triple antibiotic therapy and followed by ID.  Case and plan discussed with Dr. Rogue Bussing   RTC next week for follow-up  Patient expressed understanding and was in agreement with this plan. He also understands that He can call clinic at any time with any questions, concerns,  or complaints.   Thank you for allowing me to participate in the care of this very pleasant patient.   Time Total: 15 minutes  Visit consisted of counseling and education dealing with the complex and emotionally intense issues of symptom management in the setting of serious illness.Greater than 50%  of this time was spent counseling and coordinating care related to the above assessment and plan.  Signed by: Altha Harm, PhD, NP-C

## 2022-07-23 NOTE — Telephone Encounter (Signed)
Attempted to reach Jan [phone]). Left vm for apt information. Will need to see patient at 1245 for port lab/ smc/ infusion

## 2022-07-28 ENCOUNTER — Inpatient Hospital Stay: Payer: Medicare Other

## 2022-07-28 ENCOUNTER — Encounter: Payer: Self-pay | Admitting: Hospice and Palliative Medicine

## 2022-07-28 ENCOUNTER — Inpatient Hospital Stay (HOSPITAL_BASED_OUTPATIENT_CLINIC_OR_DEPARTMENT_OTHER): Payer: Medicare Other | Admitting: Hospice and Palliative Medicine

## 2022-07-28 VITALS — BP 101/67 | HR 80 | Temp 96.7°F | Resp 16 | Ht 72.0 in | Wt 154.0 lb

## 2022-07-28 DIAGNOSIS — C3492 Malignant neoplasm of unspecified part of left bronchus or lung: Secondary | ICD-10-CM

## 2022-07-28 DIAGNOSIS — Z5112 Encounter for antineoplastic immunotherapy: Secondary | ICD-10-CM | POA: Diagnosis not present

## 2022-07-28 LAB — COMPREHENSIVE METABOLIC PANEL
ALT: 17 U/L (ref 0–44)
AST: 21 U/L (ref 15–41)
Albumin: 3.5 g/dL (ref 3.5–5.0)
Alkaline Phosphatase: 137 U/L — ABNORMAL HIGH (ref 38–126)
Anion gap: 7 (ref 5–15)
BUN: 23 mg/dL (ref 8–23)
CO2: 27 mmol/L (ref 22–32)
Calcium: 8.2 mg/dL — ABNORMAL LOW (ref 8.9–10.3)
Chloride: 100 mmol/L (ref 98–111)
Creatinine, Ser: 0.79 mg/dL (ref 0.61–1.24)
GFR, Estimated: >60 mL/min (ref >60)
Glucose, Bld: 124 mg/dL — ABNORMAL HIGH (ref 70–99)
Potassium: 3.7 mmol/L (ref 3.5–5.1)
Sodium: 134 mmol/L — ABNORMAL LOW (ref 135–145)
Total Bilirubin: 0.6 mg/dL (ref 0.3–1.2)
Total Protein: 6.5 g/dL (ref 6.5–8.1)

## 2022-07-28 LAB — CBC WITH DIFFERENTIAL/PLATELET
Abs Immature Granulocytes: 0.1 10*3/uL — ABNORMAL HIGH (ref 0.00–0.07)
Basophils Absolute: 0 10*3/uL (ref 0.0–0.1)
Basophils Relative: 0 %
Eosinophils Absolute: 0.1 10*3/uL (ref 0.0–0.5)
Eosinophils Relative: 1 %
HCT: 40.4 % (ref 39.0–52.0)
Hemoglobin: 13.2 g/dL (ref 13.0–17.0)
Immature Granulocytes: 1 %
Lymphocytes Relative: 13 %
Lymphs Abs: 1.3 10*3/uL (ref 0.7–4.0)
MCH: 31.2 pg (ref 26.0–34.0)
MCHC: 32.7 g/dL (ref 30.0–36.0)
MCV: 95.5 fL (ref 80.0–100.0)
Monocytes Absolute: 0.8 10*3/uL (ref 0.1–1.0)
Monocytes Relative: 8 %
Neutro Abs: 7.6 10*3/uL (ref 1.7–7.7)
Neutrophils Relative %: 77 %
Platelets: 283 10*3/uL (ref 150–400)
RBC: 4.23 MIL/uL (ref 4.22–5.81)
RDW: 15.1 % (ref 11.5–15.5)
WBC: 9.8 10*3/uL (ref 4.0–10.5)
nRBC: 0 % (ref 0.0–0.2)

## 2022-07-28 MED ORDER — SODIUM CHLORIDE 0.9% FLUSH
10.0000 mL | Freq: Once | INTRAVENOUS | Status: AC
  Administered 2022-07-28: 10 mL via INTRAVENOUS
  Filled 2022-07-28: qty 10

## 2022-07-28 MED ORDER — HEPARIN SOD (PORK) LOCK FLUSH 100 UNIT/ML IV SOLN
500.0000 [IU] | Freq: Once | INTRAVENOUS | Status: AC
  Administered 2022-07-28: 500 [IU] via INTRAVENOUS
  Filled 2022-07-28: qty 5

## 2022-07-28 NOTE — Progress Notes (Signed)
Pt  states that he feels extra air in stomach and his daughter states he is bloated and burps a lot. His appetite it not as good as it has been. He felt tired and not good appetite last week and got fluids and made him feel better. He has protein drinks and only takes them if he did not eat good. I suggested for him to take a protein drink daily. The only meat he can eat is chicken and does eat fruit

## 2022-07-28 NOTE — Progress Notes (Signed)
Symptom Management [place] at [facility] [phone] Fax:([phone]  Patient Care Team: Baxter Hire, MD as PCP - General (Internal Medicine) [name], RN as Oncology Nurse Navigator [name], MD as Consulting Physician (Hematology and Oncology) [name], RN as Registered Nurse (Oncology)   NAME OF PATIENT: Jason Warren  [id number]  August 22, 1929   DATE OF VISIT: 07/28/22  REASON FOR CONSULT: Norvin Ohlin is a 88 y.o. male with multiple medical problems including stage IV non-small cell lung cancer originally diagnosed November 2021 status post CarboTaxol RT.  Status post Imfinzi for 12 months.  Brain metastasis in March 2023 status post SBRT.  Patient most recently on treatment with Keytruda.  Patient is followed by ID for treatment of atypical mycobacterial skin infection -on treatment with Levaquin/Doxy/clarithromycin.  INTERVAL HISTORY: Patient was seen in [facility] on 07/23/2022 for nausea thought secondary to his antibiotic regimen.  He was started on prochlorperazine in addition to his ondansetron.  Patient returns [facility] today for follow-up regarding symptoms.  Patient states that he feels good today without any acute symptoms.  He reports improved appetite and resolution of nausea and vomiting.  He does endorse some frustration with his antibiotics and says he is unsure that he wants to continue antibiotic regimen.  Denies recent fevers or illnesses. Denies any easy bleeding or bruising. Reports fair appetite and denies weight loss. Denies chest pain. Denies any nausea, vomiting, constipation, or diarrhea. Denies urinary complaints. Patient offers no further specific complaints today.   PAST MEDICAL HISTORY: Past Medical History:  Diagnosis Date   Arthritis    Dysrhythmia    A-fib   Hip pain    left   HOH (hard of hearing)    LBBB (left bundle branch block) 08/05/2020   Lung cancer ([place])    Skin  cancer, basal cell     face top of head    PAST SURGICAL HISTORY:  Past Surgical History:  Procedure Laterality Date   CATARACT EXTRACTION W/PHACO Right 07/13/2017   Procedure: CATARACT EXTRACTION PHACO AND INTRAOCULAR LENS PLACEMENT ([place]);  Surgeon: Leandrew Koyanagi, MD;  Location: [place];  Service: Ophthalmology;  Laterality: Right;  IVA TOPICAL RIGHT   CATARACT EXTRACTION W/PHACO Left 01/04/2018   Procedure: CATARACT EXTRACTION PHACO AND INTRAOCULAR LENS PLACEMENT ([place]) LEFT;  Surgeon: Leandrew Koyanagi, MD;  Location: [place];  Service: Ophthalmology;  Laterality: Left;   COLONOSCOPY     FEMUR FRACTURE SURGERY Right 01/03/2022   INTRAMEDULLARY (IM) NAIL INTERTROCHANTERIC Right 01/03/2022   Procedure: INTRAMEDULLARY (IM) NAIL INTERTROCHANTRIC;  Surgeon: Corky Mull, MD;  Location: [facility];  Service: Orthopedics;  Laterality: Right;   IR IMAGING GUIDED PORT INSERTION  08/27/2020   JOINT REPLACEMENT     Left total hip Dr. Su Hoff 08-04-18   MOHS SURGERY  06/01/2022   ROTATOR CUFF REPAIR Right    SKIN CANCER EXCISION     face   TOTAL HIP ARTHROPLASTY Left 08/04/2018   Procedure: TOTAL HIP ARTHROPLASTY ANTERIOR APPROACH;  Surgeon: Frederik Pear, MD;  [facility];  Service: Orthopedics;  Laterality: Left;   VIDEO BRONCHOSCOPY WITH ENDOBRONCHIAL NAVIGATION N/A 08/07/2020   Procedure: VIDEO BRONCHOSCOPY WITH ENDOBRONCHIAL NAVIGATION;  Surgeon: Ottie Glazier, MD;  Location: [facility];  Service: Thoracic;  Laterality: N/A;   VIDEO BRONCHOSCOPY WITH ENDOBRONCHIAL ULTRASOUND N/A 08/07/2020   Procedure: VIDEO BRONCHOSCOPY WITH ENDOBRONCHIAL ULTRASOUND;  Surgeon: Ottie Glazier, MD;  Location: [facility];  Service: Thoracic;  Laterality: N/A;  HEMATOLOGY/ONCOLOGY HISTORY:  Oncology History Overview Note  # NOV 2021- LEFT LOWER LOBE NON-SMALL CELL CA [favor adeno ca]; T2N3 [right hilar; subcarinal LN;[name]; NOV 2021-MRI Brain-NEG  # 11/30-  carbo-Taxol-RT [RT until 10/23/20]; s/p IMFINZI on 2/24. [91 months]; finished February 2023.  # #Brain metastases-March 2023-MRI-brain 2.1 cm peripherally enhancing lesion in the right paracentral lobule concerning for metastatic disease. There is mild perilesional edema and regional mass effect but no midline shift. On dexamethasone 2 mg a day.  Noted to have significant movement of the left lower extremity weakness.  Plan with SBRT on 3/22.  # SEP 20th, 2023- Keytruda [PDL-1:90%]  # Right lower lobe - ~4.6 x 3.1 cm  [PET 07-2020]- demonstrates no significant hypermetabolic activity (SUV max 1.8]-benign. STABLE.   # OCT [id number] Flock- disease in the tracheoesophageal groove-proceed with Keytruda  [PD-L1-90 percent]  # A.fib [Eliquis; Dr.Kowalski]  # # SURVIVORSHIP:   # GENETICS:   #NGS-ordered  DIAGNOSIS: Lung cancer  STAGE:   III      ;  GOALS:  cure  CURRENT/MOST RECENT THERAPY : carbo-Taxol-RT ]   Stage 4 lung cancer, left ([place])  08/14/2020 Initial Diagnosis   Cancer of lower lobe of left lung ([place])   09/02/2020 - 10/22/2020 Chemotherapy    Patient is on Treatment Plan: LUNG DURVALUMAB Q14D      10/16/2020 Cancer Staging   Staging form: Lung, AJCC 8th Edition - Clinical: Stage IIIB (cT2a, cN3, cM0) - Signed by [name], MD on 10/16/2020   12/17/2020 - 11/27/2021 Chemotherapy   Patient is on Treatment Plan : LUNG Durvalumab q14d     07/09/2022 -  Chemotherapy   Patient is on Treatment Plan : LUNG NSCLC Pembrolizumab (200) q21d       ALLERGIES:  has No Known Allergies.  MEDICATIONS:  Current Outpatient Medications  Medication Sig Dispense Refill   acetaminophen (TYLENOL) 500 MG tablet Take 1 tablet (500 mg total) by mouth every 6 (six) hours as needed. 30 tablet 0   apixaban (ELIQUIS) 5 MG TABS tablet Take 5 mg by mouth 2 (two) times daily.     clarithromycin (BIAXIN) 500 MG tablet Take 1 tablet (500 mg total) by mouth 2 (two) times daily. 60  tablet 2   doxycycline (ADOXA) 100 MG tablet Take 1 tablet (100 mg total) by mouth 2 (two) times daily. 60 tablet 2   furosemide (LASIX) 20 MG tablet Take 1 tablet (20 mg total) by mouth daily. 30 tablet 3   gentamicin ointment (GARAMYCIN) 0.1 % Apply 1 Application topically 3 (three) times daily. 15 g 0   hydroxypropyl methylcellulose / hypromellose (ISOPTO TEARS / GONIOVISC) 2.5 % ophthalmic solution Place 1 drop into the left eye daily as needed for dry eyes.     levofloxacin (LEVAQUIN) 500 MG tablet Take 1 tablet (500 mg total) by mouth daily. 30 tablet 2   lidocaine-prilocaine (EMLA) cream Apply 1 Application topically as needed. Apply small amount to port site at least 1 hour prior to it being accessed, cover with plastic wrap 30 g 1   ondansetron (ZOFRAN) 8 MG tablet One pill every 8 hours as needed for nausea/vomitting. 40 tablet 1   pravastatin (PRAVACHOL) 80 MG tablet Take 80 mg by mouth at bedtime.      saw palmetto 160 MG capsule Take 160 mg by mouth 2 (two) times daily.     traZODone (DESYREL) 50 MG tablet Take 1 tablet (50 mg total) by mouth at bedtime as needed for  sleep. 30 tablet 3   Multiple Vitamin (MULTIVITAMIN) tablet Take 1 tablet by mouth daily. (Patient not taking: Reported on 07/28/2022)     prochlorperazine (COMPAZINE) 10 MG tablet Take 0.5-1 tablets (5-10 mg total) by mouth every 6 (six) hours as needed for nausea or vomiting. (Patient not taking: Reported on 07/28/2022) 30 tablet 0   No current facility-administered medications for this visit.   Facility-Administered Medications Ordered in Other Visits  Medication Dose Route Frequency Provider Last Rate Last Admin   heparin lock flush 100 UNIT/ML injection            heparin lock flush 100 unit/mL  500 Units Intravenous Once Charlaine Dalton R, MD       heparin lock flush 100 unit/mL  500 Units Intravenous Once Boss Danielsen, Kirt Boys, NP       sodium chloride flush (NS) 0.9 % injection 10 mL  10 mL Intravenous PRN  [name], MD   10 mL at 02/11/21 0835   sodium chloride flush (NS) 0.9 % injection 10 mL  10 mL Intravenous Once Renardo Cheatum, Kirt Boys, NP        VITAL SIGNS: BP 101/67   Pulse 80   Temp (!) 96.7 F (35.9 C) (Tympanic)   Resp 16   Ht 6' (1.829 m)   Wt 154 lb (69.9 kg)   BMI 20.89 kg/m  Filed Weights   07/28/22 1036  Weight: 154 lb (69.9 kg)    Estimated body mass index is 20.89 kg/m as calculated from the following:   Height as of this encounter: 6' (1.829 m).   Weight as of this encounter: 154 lb (69.9 kg).  LABS: CBC:    Component Value Date/Time   WBC 9.8 07/28/2022 1023   HGB 13.2 07/28/2022 1023   HCT 40.4 07/28/2022 1023   PLT 283 07/28/2022 1023   MCV 95.5 07/28/2022 1023   NEUTROABS 7.6 07/28/2022 1023   LYMPHSABS 1.3 07/28/2022 1023   MONOABS 0.8 07/28/2022 1023   EOSABS 0.1 07/28/2022 1023   BASOSABS 0.0 07/28/2022 1023   Comprehensive Metabolic Panel:    Component Value Date/Time   NA 134 (L) 07/28/2022 1023   K 3.7 07/28/2022 1023   CL 100 07/28/2022 1023   CO2 27 07/28/2022 1023   BUN 23 07/28/2022 1023   CREATININE 0.79 07/28/2022 1023   GLUCOSE 124 (H) 07/28/2022 1023   CALCIUM 8.2 (L) 07/28/2022 1023   AST 21 07/28/2022 1023   ALT 17 07/28/2022 1023   ALKPHOS 137 (H) 07/28/2022 1023   BILITOT 0.6 07/28/2022 1023   PROT 6.5 07/28/2022 1023   ALBUMIN 3.5 07/28/2022 1023    RADIOGRAPHIC STUDIES: No results found.  PERFORMANCE STATUS (ECOG) : 2 - Symptomatic, <50% confined to bed  Review of Systems Unless otherwise noted, a complete review of systems is negative.  Physical Exam General: NAD Cardiovascular: regular rate and rhythm Pulmonary: clear ant fields Abdomen: soft, nontender, + bowel sounds GU: no suprapubic tenderness Extremities: no edema, no joint deformities Skin: Multiple raised, erythematous, nondraining lesions to legs/arms Neurological: Weakness but otherwise nonfocal   IMPRESSION/PLAN: Nausea without  vomiting -Resolved.  Continue as needed antiemetics.  Continue supportive care as needed.  Mycobacterial skin lesions -on triple antibiotic therapy and followed by ID.  Patient says he is unsure if he wants to continue antibiotic regimen versus immunotherapy.  He sees Dr. Rogue Bussing on Monday to discuss treatment options.  Case and plan discussed with Dr. Rogue Bussing   RTC next week  for follow-up as previously scheduled  Patient expressed understanding and was in agreement with this plan. He also understands that He can call clinic at any time with any questions, concerns, or complaints.   Thank you for allowing me to participate in the care of this very pleasant patient.   Time Total: 15 minutes  Visit consisted of counseling and education dealing with the complex and emotionally intense issues of symptom management in the setting of serious illness.Greater than 50%  of this time was spent counseling and coordinating care related to the above assessment and plan.  Signed by: Altha Harm, PhD, NP-C

## 2022-07-29 ENCOUNTER — Encounter: Payer: Self-pay | Admitting: Infectious Diseases

## 2022-07-29 ENCOUNTER — Ambulatory Visit: Payer: Medicare Other | Attending: Infectious Diseases | Admitting: Infectious Diseases

## 2022-07-29 DIAGNOSIS — T887XXA Unspecified adverse effect of drug or medicament, initial encounter: Secondary | ICD-10-CM

## 2022-07-29 DIAGNOSIS — Z923 Personal history of irradiation: Secondary | ICD-10-CM | POA: Diagnosis not present

## 2022-07-29 DIAGNOSIS — Z85118 Personal history of other malignant neoplasm of bronchus and lung: Secondary | ICD-10-CM | POA: Diagnosis not present

## 2022-07-29 DIAGNOSIS — D849 Immunodeficiency, unspecified: Secondary | ICD-10-CM | POA: Diagnosis not present

## 2022-07-29 DIAGNOSIS — A318 Other mycobacterial infections: Secondary | ICD-10-CM | POA: Insufficient documentation

## 2022-07-29 DIAGNOSIS — C3490 Malignant neoplasm of unspecified part of unspecified bronchus or lung: Secondary | ICD-10-CM

## 2022-07-29 DIAGNOSIS — K5900 Constipation, unspecified: Secondary | ICD-10-CM | POA: Diagnosis not present

## 2022-07-29 DIAGNOSIS — R229 Localized swelling, mass and lump, unspecified: Secondary | ICD-10-CM | POA: Diagnosis not present

## 2022-07-29 DIAGNOSIS — Z9221 Personal history of antineoplastic chemotherapy: Secondary | ICD-10-CM | POA: Diagnosis not present

## 2022-07-29 DIAGNOSIS — R630 Anorexia: Secondary | ICD-10-CM | POA: Diagnosis not present

## 2022-07-29 DIAGNOSIS — I4891 Unspecified atrial fibrillation: Secondary | ICD-10-CM | POA: Insufficient documentation

## 2022-07-29 NOTE — Progress Notes (Signed)
NAME: Jason Warren  DOB: 1928-10-13  MRN: [id number]  Date/Time: 07/29/2022 12:53 PM  The purpose of this virtual visit is to provide medical care while limiting exposure to the novel coronavirus (COVID19) for both patient and office staff.   Consent was obtained for phone visit:  Yes.   Answered questions that patient had about telehealth interaction:  Yes.   I discussed the limitations, risks, security and privacy concerns of performing an evaluation and management service by telephone. I also discussed with the patient that there may be a patient responsible charge related to this service. The patient expressed understanding and agreed to proceed.   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 HAs history of Afib, metatstaic lung cancer  s/p chemo, radiation, PDLI inhibitor and radiation to brain for mets.  HE restarted immune therapy 2 weeks ago Developed one new skin nodule since then  Past Medical History:  Diagnosis Date   Arthritis    Dysrhythmia    A-fib   Hip pain    left   HOH (hard of hearing)    LBBB (left bundle branch block) 08/05/2020   Lung cancer ([place])    Skin cancer, basal cell     face top of head    Past Surgical History:  Procedure Laterality Date   CATARACT EXTRACTION W/PHACO Right 07/13/2017   Procedure: CATARACT EXTRACTION PHACO AND INTRAOCULAR LENS PLACEMENT ([place]);  Surgeon: Leandrew Koyanagi, MD;  Location: [place];  Service: Ophthalmology;  Laterality: Right;  IVA TOPICAL RIGHT   CATARACT EXTRACTION W/PHACO Left 01/04/2018   Procedure: CATARACT EXTRACTION PHACO AND INTRAOCULAR LENS PLACEMENT ([place]) LEFT;  Surgeon: Leandrew Koyanagi, MD;  Location:  [place];  Service: Ophthalmology;  Laterality: Left;   COLONOSCOPY     FEMUR FRACTURE SURGERY Right 01/03/2022   INTRAMEDULLARY (IM) NAIL INTERTROCHANTERIC Right 01/03/2022   Procedure: INTRAMEDULLARY (IM) NAIL INTERTROCHANTRIC;  Surgeon: Corky Mull, MD;  Location: [facility];  Service: Orthopedics;  Laterality: Right;   IR IMAGING GUIDED PORT INSERTION  08/27/2020   JOINT REPLACEMENT     Left total hip Dr. Su Hoff 08-04-18   MOHS SURGERY  06/01/2022   ROTATOR CUFF REPAIR Right    SKIN CANCER EXCISION     face   TOTAL HIP ARTHROPLASTY Left 08/04/2018   Procedure: TOTAL HIP ARTHROPLASTY ANTERIOR APPROACH;  Surgeon: Frederik Pear, MD;  [facility];  Service: Orthopedics;  Laterality: Left;   VIDEO BRONCHOSCOPY WITH ENDOBRONCHIAL NAVIGATION N/A 08/07/2020   Procedure: VIDEO BRONCHOSCOPY WITH ENDOBRONCHIAL NAVIGATION;  Surgeon: Ottie Glazier, MD;  Location: [facility];  Service: Thoracic;  Laterality: N/A;   VIDEO BRONCHOSCOPY WITH ENDOBRONCHIAL ULTRASOUND N/A 08/07/2020   Procedure: VIDEO BRONCHOSCOPY WITH ENDOBRONCHIAL ULTRASOUND;  Surgeon: Ottie Glazier, MD;  Location: [facility];  Service: Thoracic;  Laterality: N/A;    Social History   Socioeconomic History   Marital status: Married    Spouse name: Not on file   Number of children: Not on file   Years of education: Not on file   Highest education level: Not on file  Occupational History   Not on file  Tobacco Use   Smoking status: Former    Packs/day: 1.00    Years: 4.00    Total  pack years: 4.00    Types: Cigarettes   Smokeless tobacco: Never   Tobacco comments:    quit early 70's  Vaping Use   Vaping Use: Never used  Substance and Sexual Activity   Alcohol use: No   Drug use: No   Sexual activity: Not Currently  Other Topics Concern   Not on file  Social History Narrative   > quit 35 years; smoked for 15 years. Rare alcohol. 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    Social Determinants of Health   Financial Resource Strain: Not on file  Food Insecurity: Not on file  Transportation Needs: Not on file  Physical Activity: Not on file  Stress: Not on file  Social Connections: Not on file  Intimate Partner Violence: Not on file    Family History  Problem Relation Age of Onset   Throat cancer Brother         & lung cancer   No Known Allergies I? Current Outpatient Medications  Medication Sig Dispense Refill   acetaminophen (TYLENOL) 500 MG tablet Take 1 tablet (500 mg total) by mouth every 6 (six) hours as needed. 30 tablet 0   apixaban (ELIQUIS) 5 MG TABS tablet Take 5 mg by mouth 2 (two) times daily.     clarithromycin (BIAXIN) 500 MG tablet Take 1 tablet (500 mg total) by mouth 2 (two) times daily. 60 tablet 2   doxycycline (ADOXA) 100 MG tablet Take 1 tablet (100 mg total) by mouth 2 (two) times daily. 60 tablet 2   furosemide (LASIX) 20 MG tablet Take 1 tablet (20 mg total) by mouth daily. 30 tablet 3   gentamicin ointment (GARAMYCIN) 0.1 % Apply 1 Application topically 3 (three) times daily. 15 g 0   hydroxypropyl methylcellulose / hypromellose (ISOPTO TEARS / GONIOVISC) 2.5 % ophthalmic solution Place 1 drop into the left eye daily as needed for dry eyes.     levofloxacin (LEVAQUIN) 500 MG tablet Take 1 tablet (500 mg total) by mouth daily. 30 tablet 2   lidocaine-prilocaine (EMLA) cream Apply 1 Application topically as needed. Apply small amount to port site at least 1 hour prior to it being accessed, cover with plastic wrap 30 g 1   Multiple Vitamin (MULTIVITAMIN) tablet Take 1 tablet by mouth daily. (Patient not taking: Reported on 07/28/2022)     ondansetron (ZOFRAN) 8 MG tablet One pill every 8 hours as needed for nausea/vomitting. 40 tablet 1   pravastatin (PRAVACHOL) 80 MG tablet Take 80 mg by mouth at bedtime.      prochlorperazine (COMPAZINE) 10 MG tablet Take 0.5-1 tablets (5-10 mg total) by  mouth every 6 (six) hours as needed for nausea or vomiting. (Patient not taking: Reported on 07/28/2022) 30 tablet 0   saw palmetto 160 MG capsule Take 160 mg by mouth 2 (two) times daily.     traZODone (DESYREL) 50 MG tablet Take 1 tablet (50 mg total) by mouth at bedtime as needed for sleep. 30 tablet 3   No current facility-administered medications for this visit.   Facility-Administered Medications Ordered in Other Visits  Medication Dose Route Frequency Provider Last Rate Last Admin   heparin lock flush 100 UNIT/ML injection            heparin lock flush 100 unit/mL  500 Units Intravenous Once Charlaine Dalton R, MD       sodium chloride flush (NS) 0.9 %  injection 10 mL  10 mL Intravenous PRN [name], MD   10 mL at 02/11/21 0835   sodium chloride flush (NS) 0.9 % injection 10 mL  10 mL Intravenous Once Borders, Kirt Boys, NP         Abtx:  Anti-infectives (From admission, onward)    None       REVIEW OF SYSTEMS:  Const: negative fever, negative chills, + +weight loss GI: poor appetite, severe constipation needing enema No energy fatigue  anxiety,       Impression/Recommendation   Stage IV metastaic non small cell lung cancer - getting immune check point inhibitor treatment  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 Linzess ( to discuss with oncology) Also discussed with heme onc regarding use of marinol  Will follow up 1 week by phone  Discussed the management with patient, wife and daughter [name] Total time spent 11 min ?

## 2022-08-02 ENCOUNTER — Encounter: Payer: Self-pay | Admitting: Internal Medicine

## 2022-08-02 ENCOUNTER — Inpatient Hospital Stay: Payer: Medicare Other

## 2022-08-02 ENCOUNTER — Inpatient Hospital Stay (HOSPITAL_BASED_OUTPATIENT_CLINIC_OR_DEPARTMENT_OTHER): Payer: Medicare Other | Admitting: Internal Medicine

## 2022-08-02 ENCOUNTER — Other Ambulatory Visit: Payer: Self-pay

## 2022-08-02 VITALS — BP 101/66 | HR 82 | Temp 97.3°F | Resp 16 | Ht 72.0 in | Wt 149.8 lb

## 2022-08-02 DIAGNOSIS — C3492 Malignant neoplasm of unspecified part of left bronchus or lung: Secondary | ICD-10-CM

## 2022-08-02 DIAGNOSIS — Z5112 Encounter for antineoplastic immunotherapy: Secondary | ICD-10-CM | POA: Diagnosis not present

## 2022-08-02 LAB — CBC WITH DIFFERENTIAL/PLATELET
Abs Immature Granulocytes: 0.09 10*3/uL — ABNORMAL HIGH (ref 0.00–0.07)
Basophils Absolute: 0 10*3/uL (ref 0.0–0.1)
Basophils Relative: 0 %
Eosinophils Absolute: 0.1 10*3/uL (ref 0.0–0.5)
Eosinophils Relative: 1 %
HCT: 41.6 % (ref 39.0–52.0)
Hemoglobin: 13.7 g/dL (ref 13.0–17.0)
Immature Granulocytes: 1 %
Lymphocytes Relative: 14 %
Lymphs Abs: 1.4 10*3/uL (ref 0.7–4.0)
MCH: 31.4 pg (ref 26.0–34.0)
MCHC: 32.9 g/dL (ref 30.0–36.0)
MCV: 95.2 fL (ref 80.0–100.0)
Monocytes Absolute: 0.9 10*3/uL (ref 0.1–1.0)
Monocytes Relative: 9 %
Neutro Abs: 7.5 10*3/uL (ref 1.7–7.7)
Neutrophils Relative %: 75 %
Platelets: 285 10*3/uL (ref 150–400)
RBC: 4.37 MIL/uL (ref 4.22–5.81)
RDW: 14.6 % (ref 11.5–15.5)
WBC: 10 10*3/uL (ref 4.0–10.5)
nRBC: 0 % (ref 0.0–0.2)

## 2022-08-02 LAB — COMPREHENSIVE METABOLIC PANEL
ALT: 16 U/L (ref 0–44)
AST: 25 U/L (ref 15–41)
Albumin: 3.7 g/dL (ref 3.5–5.0)
Alkaline Phosphatase: 160 U/L — ABNORMAL HIGH (ref 38–126)
Anion gap: 7 (ref 5–15)
BUN: 19 mg/dL (ref 8–23)
CO2: 25 mmol/L (ref 22–32)
Calcium: 8.4 mg/dL — ABNORMAL LOW (ref 8.9–10.3)
Chloride: 99 mmol/L (ref 98–111)
Creatinine, Ser: 0.84 mg/dL (ref 0.61–1.24)
GFR, Estimated: >60 mL/min (ref >60)
Glucose, Bld: 117 mg/dL — ABNORMAL HIGH (ref 70–99)
Potassium: 4.3 mmol/L (ref 3.5–5.1)
Sodium: 131 mmol/L — ABNORMAL LOW (ref 135–145)
Total Bilirubin: 0.7 mg/dL (ref 0.3–1.2)
Total Protein: 7 g/dL (ref 6.5–8.1)

## 2022-08-02 MED ORDER — SODIUM CHLORIDE 0.9 % IV SOLN
200.0000 mg | Freq: Once | INTRAVENOUS | Status: AC
  Administered 2022-08-02: 200 mg via INTRAVENOUS
  Filled 2022-08-02: qty 8

## 2022-08-02 MED ORDER — MIRTAZAPINE 7.5 MG PO TABS: 7.5000 mg | ORAL_TABLET | Freq: Every day | ORAL | 1 refills | Status: DC

## 2022-08-02 MED ORDER — SODIUM CHLORIDE 0.9 % IV SOLN
Freq: Once | INTRAVENOUS | Status: AC
  Filled 2022-08-02: qty 250

## 2022-08-02 MED ORDER — LIDOCAINE-PRILOCAINE 2.5-2.5 % EX CREA: 1.0000 | TOPICAL_CREAM | CUTANEOUS | 1 refills | Status: DC | PRN

## 2022-08-02 MED ORDER — HEPARIN SOD (PORK) LOCK FLUSH 100 UNIT/ML IV SOLN
500.0000 [IU] | Freq: Once | INTRAVENOUS | Status: AC | PRN
  Administered 2022-08-02: 500 [IU]
  Filled 2022-08-02: qty 5

## 2022-08-02 NOTE — Progress Notes (Signed)
Decreasing appetite with 5 lb wt loss.  Still having difficulty swallowing solids and cough when eating.  Worsening constipation with last bowel movement 2 days ago.     Has stopped Levauin as advised by ID MD.

## 2022-08-02 NOTE — Patient Instructions (Signed)
[facility]  Discharge Instructions: Thank you for choosing [facility] to provide your oncology and hematology care.  If you have a lab appointment with the Cancer Center, please go directly to the Cancer Center and check in at the registration area.  Wear comfortable clothing and clothing appropriate for easy access to any Portacath or PICC line.   We strive to give you quality time with your provider. You may need to reschedule your appointment if you arrive late (15 or more minutes).  Arriving late affects you and other patients whose appointments are after yours.  Also, if you miss three or more appointments without notifying the office, you may be dismissed from the clinic at the provider's discretion.      For prescription refill requests, have your pharmacy contact our office and allow 72 hours for refills to be completed.       To help prevent nausea and vomiting after your treatment, we encourage you to take your nausea medication as directed.  BELOW ARE SYMPTOMS THAT SHOULD BE REPORTED IMMEDIATELY: *FEVER GREATER THAN 100.4 F (38 C) OR HIGHER *CHILLS OR SWEATING *NAUSEA AND VOMITING THAT IS NOT CONTROLLED WITH YOUR NAUSEA MEDICATION *UNUSUAL SHORTNESS OF BREATH *UNUSUAL BRUISING OR BLEEDING *URINARY PROBLEMS (pain or burning when urinating, or frequent urination) *BOWEL PROBLEMS (unusual diarrhea, constipation, pain near the anus) TENDERNESS IN MOUTH AND THROAT WITH OR WITHOUT PRESENCE OF ULCERS (sore throat, sores in mouth, or a toothache) UNUSUAL RASH, SWELLING OR PAIN  UNUSUAL VAGINAL DISCHARGE OR ITCHING   Items with * indicate a potential emergency and should be followed up as soon as possible or go to the Emergency Department if any problems should occur.  Please show the CHEMOTHERAPY ALERT CARD or IMMUNOTHERAPY ALERT CARD at check-in to the Emergency Department and triage nurse.  Should you have questions after your  visit or need to cancel or reschedule your appointment, please contact [facility]  [phone] and follow the prompts.  Office hours are 8:00 a.m. to 4:30 p.m. Monday - Friday. Please note that voicemails left after 4:00 p.m. may not be returned until the following business day.  We are closed weekends and major holidays. You have access to a nurse at all times for urgent questions. Please call the main number to the clinic [phone] and follow the prompts.  For any non-urgent questions, you may also contact your provider using MyChart. We now offer e-Visits for anyone 18 and older to request care online for non-urgent symptoms. For details visit [email or web address].   Also download the MyChart app! Go to the app store, search "MyChart", open the app, select [facility], and log in with your MyChart username and password.  Masks are optional in the cancer centers. If you would like for your care team to wear a mask while they are taking care of you, please let them know. For doctor visits, patients may have with them one support person who is at least 86 years old. At this time, visitors are not allowed in the infusion area.   

## 2022-08-02 NOTE — Progress Notes (Signed)
[facility] NOTE  Patient Care Team: Baxter Hire, MD as PCP - General (Internal Medicine) [name], RN as Oncology Nurse Navigator [name], MD as Consulting Physician (Hematology and Oncology) [name], RN as Registered Nurse (Oncology)  CHIEF COMPLAINTS/PURPOSE OF CONSULTATION: Lung cancer  #  Oncology History Overview Note  # NOV 2021- LEFT LOWER LOBE NON-SMALL CELL CA [favor adeno ca]; T2N3 [right hilar; subcarinal LN;[name]; NOV 2021-MRI [place]  # 11/30- carbo-Taxol-RT [RT until 10/23/20]; s/p IMFINZI on 2/24. [93 months]; finished February 2023.  # #Brain metastases-March 2023-MRI-brain 2.1 cm peripherally enhancing lesion in the right paracentral lobule concerning for metastatic disease. There is mild perilesional edema and regional mass effect but no midline shift. On dexamethasone 2 mg a day.  Noted to have significant movement of the left lower extremity weakness.  Plan with SBRT on 3/22.  # SEP 20th, 2023- Keytruda [PDL-1:90%]  # Right lower lobe - ~4.6 x 3.1 cm  [PET 07-2020]- demonstrates no significant hypermetabolic activity (SUV max 1.8]-benign. STABLE.   # OCT [id number] Flock- disease in the tracheoesophageal groove-proceed with Keytruda  [PD-L1-90 percent]  # A.fib [Eliquis; Dr.Kowalski]  # # SURVIVORSHIP:   # GENETICS:   #NGS-ordered  DIAGNOSIS: Lung cancer  STAGE:   III      ;  GOALS:  cure  CURRENT/MOST RECENT THERAPY : carbo-Taxol-RT ]   Stage 4 lung cancer, left ([place])  08/14/2020 Initial Diagnosis   Cancer of lower lobe of left lung ([place])   09/02/2020 - 10/22/2020 Chemotherapy    Patient is on Treatment Plan: LUNG DURVALUMAB Q14D      10/16/2020 Cancer Staging   Staging form: Lung, AJCC 8th Edition - Clinical: Stage IIIB (cT2a, cN3, cM0) - Signed by [name], MD on 10/16/2020   12/17/2020 - 11/27/2021 Chemotherapy   Patient is on Treatment Plan : LUNG Durvalumab  q14d     07/09/2022 -  Chemotherapy   Patient is on Treatment Plan : LUNG NSCLC Pembrolizumab (200) q21d      HISTORY OF PRESENTING ILLNESS: Patient i ambulating with the rolling walker.  He is accompanied by his daughter.   Jason Warren 88 y.o.  male patient with currently stage IV [metachronus stage III lung non-small cell lung; s/p treatment feb 2023 with Brain mets status post [facility] MARCH 2023 is here for a follow up/proceed with immunotherapy.  Decreasing appetite with 5 lb wt loss.  Still having difficulty swallowing solids and cough when eating.  Worsening constipation with last bowel movement 2 days ago.  Patient has used-laxatives; stool softener without any significant benefit.   Has stopped Levauin as advised by ID MD-second and nausea poor appetite. Continues to complain of poor appetite.  Complains of ongoing fatigue.  Mild cough;  not significantly worse.  Continues to have mild difficulty swallowing mostly solids. Otherwise no falls.  He continues to go around in a rolling walker.  Review of Systems  Constitutional:  Positive for malaise/fatigue. Negative for chills, diaphoresis, fever and weight loss.  HENT:  Negative for nosebleeds and sore throat.   Eyes:  Negative for double vision.  Respiratory:  Negative for hemoptysis, sputum production, shortness of breath and wheezing.   Cardiovascular:  Positive for leg swelling. Negative for chest pain, palpitations and orthopnea.  Gastrointestinal:  Negative for abdominal pain, blood in stool, constipation, diarrhea, heartburn, melena, nausea and vomiting.  Genitourinary:  Negative for dysuria and urgency.  Musculoskeletal:  Negative for back pain and  joint pain.  Skin:  Negative for itching.  Neurological:  Positive for focal weakness. Negative for tingling, weakness and headaches.  Endo/Heme/Allergies:  Does not bruise/bleed easily.  Psychiatric/Behavioral:  Negative for depression. The patient is not nervous/anxious and does  not have insomnia.      MEDICAL HISTORY:  Past Medical History:  Diagnosis Date   Arthritis    Dysrhythmia    A-fib   Hip pain    left   HOH (hard of hearing)    LBBB (left bundle branch block) 08/05/2020   Lung cancer ([place])    Skin cancer, basal cell     face top of head    SURGICAL HISTORY: Past Surgical History:  Procedure Laterality Date   CATARACT EXTRACTION W/PHACO Right 07/13/2017   Procedure: CATARACT EXTRACTION PHACO AND INTRAOCULAR LENS PLACEMENT ([place]);  Surgeon: Leandrew Koyanagi, MD;  Location: [facility];  Service: Ophthalmology;  Laterality: Right;  IVA TOPICAL RIGHT   CATARACT EXTRACTION W/PHACO Left 01/04/2018   Procedure: CATARACT EXTRACTION PHACO AND INTRAOCULAR LENS PLACEMENT ([place]) LEFT;  Surgeon: Leandrew Koyanagi, MD;  Location: [facility];  Service: Ophthalmology;  Laterality: Left;   COLONOSCOPY     FEMUR FRACTURE SURGERY Right 01/03/2022   INTRAMEDULLARY (IM) NAIL INTERTROCHANTERIC Right 01/03/2022   Procedure: INTRAMEDULLARY (IM) NAIL INTERTROCHANTRIC;  Surgeon: Corky Mull, MD;  Location: [facility];  Service: Orthopedics;  Laterality: Right;   IR IMAGING GUIDED PORT INSERTION  08/27/2020   JOINT REPLACEMENT     Left total hip Dr. Su Hoff 08-04-18   MOHS SURGERY  06/01/2022   ROTATOR CUFF REPAIR Right    SKIN CANCER EXCISION     face   TOTAL HIP ARTHROPLASTY Left 08/04/2018   Procedure: TOTAL HIP ARTHROPLASTY ANTERIOR APPROACH;  Surgeon: Frederik Pear, MD;  Location: [facility];  Service: Orthopedics;  Laterality: Left;   VIDEO BRONCHOSCOPY WITH ENDOBRONCHIAL NAVIGATION N/A 08/07/2020   Procedure: VIDEO BRONCHOSCOPY WITH ENDOBRONCHIAL NAVIGATION;  Surgeon: Ottie Glazier, MD;  Location: [facility];  Service: Thoracic;  Laterality: N/A;   VIDEO BRONCHOSCOPY WITH ENDOBRONCHIAL ULTRASOUND N/A 08/07/2020   Procedure: VIDEO BRONCHOSCOPY WITH ENDOBRONCHIAL ULTRASOUND;  Surgeon: Ottie Glazier, MD;  Location: [facility];  Service:  Thoracic;  Laterality: N/A;    SOCIAL HISTORY: Social History   Socioeconomic History   Marital status: Married    Spouse name: Not on file   Number of children: Not on file   Years of education: Not on file   Highest education level: Not on file  Occupational History   Not on file  Tobacco Use   Smoking status: Former    Packs/day: 1.00    Years: 4.00    Total pack years: 4.00    Types: Cigarettes   Smokeless tobacco: Never   Tobacco comments:    quit early 70's  Vaping Use   Vaping Use: Never used  Substance and Sexual Activity   Alcohol use: No   Drug use: No   Sexual activity: Not Currently  Other Topics Concern   Not on file  Social History Narrative   > quit 35 years; smoked for 15 years. Rare alcohol. 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    Social Determinants of Health   Financial Resource Strain: Not on file  Food Insecurity: Not on file  Transportation Needs: Not on file  Physical Activity: Not on file  Stress: Not on file  Social Connections: Not  on file  Intimate Partner Violence: Not on file    FAMILY HISTORY: Family History  Problem Relation Age of Onset   Throat cancer Brother         & lung cancer    ALLERGIES:  has No Known Allergies.  MEDICATIONS:  Current Outpatient Medications  Medication Sig Dispense Refill   acetaminophen (TYLENOL) 500 MG tablet Take 1 tablet (500 mg total) by mouth every 6 (six) hours as needed. 30 tablet 0   apixaban (ELIQUIS) 5 MG TABS tablet Take 5 mg by mouth 2 (two) times daily.     clarithromycin (BIAXIN) 500 MG tablet Take 1 tablet (500 mg total) by mouth 2 (two) times daily. 60 tablet 2   doxycycline (ADOXA) 100 MG tablet Take 1 tablet (100 mg total) by mouth 2 (two) times daily. 60 tablet 2   furosemide (LASIX) 20 MG tablet Take 1 tablet (20 mg total) by mouth daily. 30 tablet 3   gentamicin ointment (GARAMYCIN) 0.1 % Apply 1 Application  topically 3 (three) times daily. 15 g 0   hydroxypropyl methylcellulose / hypromellose (ISOPTO TEARS / GONIOVISC) 2.5 % ophthalmic solution Place 1 drop into the left eye daily as needed for dry eyes.     lidocaine-prilocaine (EMLA) cream Apply 1 Application topically as needed. Apply small amount to port site at least 1 hour prior to it being accessed, cover with plastic wrap 30 g 1   mirtazapine (REMERON) 7.5 MG tablet Take 1 tablet (7.5 mg total) by mouth at bedtime. 30 tablet 1   ondansetron (ZOFRAN) 8 MG tablet One pill every 8 hours as needed for nausea/vomitting. 40 tablet 1   pravastatin (PRAVACHOL) 80 MG tablet Take 80 mg by mouth at bedtime.      saw palmetto 160 MG capsule Take 160 mg by mouth 2 (two) times daily.     traZODone (DESYREL) 50 MG tablet Take 1 tablet (50 mg total) by mouth at bedtime as needed for sleep. 30 tablet 3   levofloxacin (LEVAQUIN) 500 MG tablet Take 1 tablet (500 mg total) by mouth daily. (Patient not taking: Reported on 08/02/2022) 30 tablet 2   Multiple Vitamin (MULTIVITAMIN) tablet Take 1 tablet by mouth daily. (Patient not taking: Reported on 07/28/2022)     prochlorperazine (COMPAZINE) 10 MG tablet Take 0.5-1 tablets (5-10 mg total) by mouth every 6 (six) hours as needed for nausea or vomiting. (Patient not taking: Reported on 07/28/2022) 30 tablet 0   No current facility-administered medications for this visit.   Facility-Administered Medications Ordered in Other Visits  Medication Dose Route Frequency Provider Last Rate Last Admin   heparin lock flush 100 UNIT/ML injection            heparin lock flush 100 unit/mL  500 Units Intravenous Once Charlaine Dalton R, MD       heparin lock flush 100 unit/mL  500 Units Intracatheter Once PRN [name], MD       pembrolizumab [facility]) 200 mg in sodium chloride 0.9 % 50 mL chemo infusion  200 mg Intravenous Once Charlaine Dalton R, MD       sodium chloride flush (NS) 0.9 % injection 10 mL  10  mL Intravenous PRN Charlaine Dalton R, MD   10 mL at 02/11/21 0835   sodium chloride flush (NS) 0.9 % injection 10 mL  10 mL Intravenous Once Borders, Kirt Boys, NP       PHYSICAL EXAMINATION: ECOG PERFORMANCE STATUS: 1 - Symptomatic but completely ambulatory  Vitals:   08/02/22 0900  BP: 101/66  Pulse: 82  Resp: 16  Temp: (!) 97.3 F (36.3 C)   Filed Weights   08/02/22 0900  Weight: 149 lb 12.8 oz (67.9 kg)   Bilateral upper lower extremity boils noted.  Physical Exam HENT:     Head: Normocephalic and atraumatic.     Mouth/Throat:     Pharynx: No oropharyngeal exudate.  Eyes:     Pupils: Pupils are equal, round, and reactive to light.  Cardiovascular:     Rate and Rhythm: Normal rate and regular rhythm.  Pulmonary:     Effort: Pulmonary effort is normal. No respiratory distress.     Breath sounds: No wheezing.     Comments: Slightly decreased breath sounds left lower lung base. Abdominal:     General: Bowel sounds are normal. There is no distension.     Palpations: Abdomen is soft. There is no mass.     Tenderness: There is no abdominal tenderness. There is no guarding or rebound.  Musculoskeletal:        General: No tenderness. Normal range of motion.     Cervical back: Normal range of motion and neck supple.  Skin:    General: Skin is warm.  Neurological:     Mental Status: He is alert and oriented to person, place, and time.  Psychiatric:        Mood and Affect: Affect normal.      LABORATORY DATA:  I have reviewed the data as listed Lab Results  Component Value Date   WBC 10.0 08/02/2022   HGB 13.7 08/02/2022   HCT 41.6 08/02/2022   MCV 95.2 08/02/2022   PLT 285 08/02/2022   Recent Labs    07/23/22 1312 07/28/22 1023 08/02/22 0856  NA 132* 134* 131*  K 4.0 3.7 4.3  CL 97* 100 99  CO2 _0 GLUCOSE 135* 124* 117*  BUN _1 CREATININE 0.83 0.79 0.84  CALCIUM 8.5* 8.2* 8.4*  GFRNONAA >60 >60 >60  PROT 6.7 6.5 7.0  ALBUMIN 3.5 3.5  3.7  AST _2 ALT _3 ALKPHOS 147* 137* 160*  BILITOT 0.7 0.6 0.7    RADIOGRAPHIC STUDIES: I have personally reviewed the radiological images as listed and agreed with the findings in the report. No results found.   ASSESSMENT & PLAN:   Stage 4 lung cancer, left ([place]) #Stage IV- -non-small cell lung cancer-[favor adenocarcinoma] [at dx-stage III- s/p  concurrent chemoradiation; s/p  adjuvant durvalumab; finished Feb 2023]; with metachronous brain metastases [March 2023];   AUG-2023-CT/PET scan-  Tracheal esophageal lymph node displays moderate increased metabolic activity in signs of central necrosis compatible with metastatic lymph node. Signs of bony metastasis to the LEFT olecranon.  Patient currently on single agent Keytruda [ PD-L1-90 percent].  #Patient s/p currently cycle #1 of Keytruda.  I had a long discussion with the patient and daughter regarding pros and cons of ongoing immunotherapy in the context of his skin infections.  There is a potential concern for worsening skin infection from immunotherapy.  However after lengthy discussion-given the overall poor prognosis from progressive lung cancer, patient is inclined to proceed with immunotherapy at this time.   #  Given recurrent in disease in the tracheoesophageal groove-proceed with Keytruda #2 today.  We will plan imaging after 3-4 treatments.   #Left olecranon metastases-continue monitoring for pain; if worse would recommend radiation..   # Brain metastases--s/p SBRT Luetta [place]ting,  23rrd-2023] AUG 2023- Continued interval decrease in size of a peripherally enhancing right frontal lobe metastasis, now measuring 9 x 8 mm. Surrounding edema persists, but has also decreased.  Will repeat imaging in 1 to 2 months; order MRI at next visit.  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 Currently OFF levaquin [stopped  10/26]-overall improved; but some concerns for new lesion.  Will inform Dr. Delaine Lame.   # Poor appetite- discussed possible adrenal insuff; [leg sweeling on steroids]reocmmend mirtazipine; new script sent  # Constipation: Status post laxatives stool softener in the past.  Miralax- recommend BID- TID prn; if not improved more frequently.  # insomnia- Bil LE swelling: sec to steroids-Tapered off-mirtazapine should improve.  Appetite and also sleep.  # A. fib on Eliquis [Dr.Kowalski]-  Mild swelling in legs- EF in Feb 2022- 55-60%- STABLE.   # Right hip fracture status post ORIF.  Nonpathologic. On  physical therapy- STABLE.  # MediPort flush q 6 W-stable.  # Vaccination- flu shots today; covid- OK.   # DISPOSITION: # Keytruda today # follow up in 3 weeks  MD; labs- cbc/cmp; TSH port- KEYTRUDA-..Dr.B  # I reviewed the blood work- with the patient in detail; also reviewed the imaging independently [as summarized above]; and with the patient in detail.             All questions were answered. The patient knows to call the clinic with any problems, questions or concerns.   [name], MD 08/02/2022 10:48 AM

## 2022-08-02 NOTE — Assessment & Plan Note (Addendum)
#  Stage IV- -non-small cell lung cancer-[favor adenocarcinoma] [at dx-stage III- s/p  concurrent chemoradiation; s/p  adjuvant durvalumab; finished Feb 2023]; with metachronous brain metastases [March 2023];   AUG-2023-CT/PET scan-  Tracheal esophageal lymph node displays moderate increased metabolic activity in signs of central necrosis compatible with metastatic lymph node. Signs of bony metastasis to the LEFT olecranon.  Patient currently on single agent Keytruda [ PD-L1-90 percent].  #Patient s/p currently cycle #1 of Keytruda.  I had a long discussion with the patient and daughter regarding pros and cons of ongoing immunotherapy in the context of his skin infections.  There is a potential concern for worsening skin infection from immunotherapy.  However after lengthy discussion-given the overall poor prognosis from progressive lung cancer, patient is inclined to proceed with immunotherapy at this time.   #  Given recurrent in disease in the tracheoesophageal groove-proceed with Keytruda #2 today.  We will plan imaging after 3-4 treatments.   #Left olecranon metastases-continue monitoring for pain; if worse would recommend radiation..   # Brain metastases--s/p SBRT [March, 23rrd-2023] AUG 2023- Continued interval decrease in size of a peripherally enhancing right frontal lobe metastasis, now measuring 9 x 8 mm. Surrounding edema persists, but has also decreased.  Will repeat imaging in 1 to 2 months; order MRI at next visit.  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 Currently OFF levaquin [stopped 10/26]-overall improved; but some concerns for new lesion.  Will inform Dr. Delaine Lame.   # Poor appetite- discussed possible adrenal insuff; [leg sweeling on steroids]reocmmend mirtazipine; new script sent  # Constipation: Status post laxatives stool softener in the past.  Miralax-  recommend BID- TID prn; if not improved more frequently.  # insomnia- Bil LE swelling: sec to steroids-Tapered off-mirtazapine should improve.  Appetite and also sleep.  # A. fib on Eliquis [Dr.Kowalski]-  Mild swelling in legs- EF in Feb 2022- 55-60%- STABLE.   # Right hip fracture status post ORIF.  Nonpathologic. On  physical therapy- STABLE.  # MediPort flush q 6 W-stable.  # Vaccination- flu shots today; covid- OK.   # DISPOSITION: # Keytruda today # follow up in 3 weeks  MD; labs- cbc/cmp; TSH port- KEYTRUDA-..Dr.B  # I reviewed the blood work- with the patient in detail; also reviewed the imaging independently [as summarized above]; and with the patient in detail.

## 2022-08-03 ENCOUNTER — Telehealth: Payer: Self-pay | Admitting: Infectious Diseases

## 2022-08-03 NOTE — Telephone Encounter (Signed)
Called the patient to find out whether his nausea /appetitie improved after stopping levaquin- No difference- but he doe snot want to go back on it- He is being treated for mycobacterium chelonae skin/soft tissue infection- on clarithromycin/doxy . Levaquin was stopped lats week He is also getting immunotherapy for ca lung HE does not want to go back on levaquin He will keep the follow up appt

## 2022-08-17 ENCOUNTER — Telehealth: Payer: Self-pay

## 2022-08-17 NOTE — Telephone Encounter (Signed)
Nutrition  Patient identified on Malnutrition Screening report for weight loss and poor appetite.    Called patient this afternoon but no answer or option to leave voicemail.    Briant Angelillo B. Zenia Resides, [place], [place] Registered Dietitian [phone]

## 2022-08-18 ENCOUNTER — Telehealth: Payer: Self-pay | Admitting: *Deleted

## 2022-08-18 ENCOUNTER — Encounter: Payer: Self-pay | Admitting: Internal Medicine

## 2022-08-18 ENCOUNTER — Other Ambulatory Visit: Payer: Self-pay | Admitting: *Deleted

## 2022-08-18 DIAGNOSIS — R630 Anorexia: Secondary | ICD-10-CM

## 2022-08-18 DIAGNOSIS — R634 Abnormal weight loss: Secondary | ICD-10-CM

## 2022-08-18 DIAGNOSIS — C3492 Malignant neoplasm of unspecified part of left bronchus or lung: Secondary | ICD-10-CM

## 2022-08-18 NOTE — Telephone Encounter (Signed)
Per request of daughter, returned her phone call to discuss. Patient reports decrease appetite/5+wt loss. He may only drink one boost at dinner and eat a yogurt at breakfast. He fixes the foods, but then doesn't want to eat it. Patient feels that the antibiotics have caused the appetite changes/perception of taste.  Daughter would like pt to see Josh in [facility] for mgmt of iv fluids. Pt has a [place] treatment apts on Monday. Pt given an apt for 10 am for port labs and f/u with [facility]/ possible iv fluids.

## 2022-08-19 ENCOUNTER — Inpatient Hospital Stay: Payer: Medicare Other

## 2022-08-19 ENCOUNTER — Encounter: Payer: Self-pay | Admitting: Hospice and Palliative Medicine

## 2022-08-19 ENCOUNTER — Inpatient Hospital Stay (HOSPITAL_BASED_OUTPATIENT_CLINIC_OR_DEPARTMENT_OTHER): Payer: Medicare Other | Admitting: Hospice and Palliative Medicine

## 2022-08-19 ENCOUNTER — Telehealth: Payer: Self-pay | Admitting: *Deleted

## 2022-08-19 ENCOUNTER — Inpatient Hospital Stay: Payer: Medicare Other | Attending: Internal Medicine

## 2022-08-19 ENCOUNTER — Other Ambulatory Visit: Payer: Self-pay

## 2022-08-19 VITALS — Wt 146.6 lb

## 2022-08-19 VITALS — BP 101/61 | HR 76 | Temp 96.0°F | Resp 20 | Ht 72.0 in | Wt 149.0 lb

## 2022-08-19 DIAGNOSIS — R5383 Other fatigue: Secondary | ICD-10-CM

## 2022-08-19 DIAGNOSIS — K5909 Other constipation: Secondary | ICD-10-CM | POA: Insufficient documentation

## 2022-08-19 DIAGNOSIS — R634 Abnormal weight loss: Secondary | ICD-10-CM

## 2022-08-19 DIAGNOSIS — R630 Anorexia: Secondary | ICD-10-CM

## 2022-08-19 DIAGNOSIS — C3492 Malignant neoplasm of unspecified part of left bronchus or lung: Secondary | ICD-10-CM

## 2022-08-19 DIAGNOSIS — C3432 Malignant neoplasm of lower lobe, left bronchus or lung: Secondary | ICD-10-CM | POA: Insufficient documentation

## 2022-08-19 DIAGNOSIS — Z452 Encounter for adjustment and management of vascular access device: Secondary | ICD-10-CM | POA: Diagnosis not present

## 2022-08-19 DIAGNOSIS — C7931 Secondary malignant neoplasm of brain: Secondary | ICD-10-CM | POA: Insufficient documentation

## 2022-08-19 DIAGNOSIS — I4891 Unspecified atrial fibrillation: Secondary | ICD-10-CM | POA: Insufficient documentation

## 2022-08-19 DIAGNOSIS — Z7901 Long term (current) use of anticoagulants: Secondary | ICD-10-CM | POA: Diagnosis not present

## 2022-08-19 DIAGNOSIS — Z5112 Encounter for antineoplastic immunotherapy: Secondary | ICD-10-CM | POA: Diagnosis not present

## 2022-08-19 DIAGNOSIS — E860 Dehydration: Secondary | ICD-10-CM

## 2022-08-19 LAB — CBC WITH DIFFERENTIAL/PLATELET
Abs Immature Granulocytes: 0.09 10*3/uL — ABNORMAL HIGH (ref 0.00–0.07)
Basophils Absolute: 0 10*3/uL (ref 0.0–0.1)
Basophils Relative: 0 %
Eosinophils Absolute: 0.1 10*3/uL (ref 0.0–0.5)
Eosinophils Relative: 1 %
HCT: 42.5 % (ref 39.0–52.0)
Hemoglobin: 13.8 g/dL (ref 13.0–17.0)
Immature Granulocytes: 1 %
Lymphocytes Relative: 15 %
Lymphs Abs: 1.4 10*3/uL (ref 0.7–4.0)
MCH: 30.5 pg (ref 26.0–34.0)
MCHC: 32.5 g/dL (ref 30.0–36.0)
MCV: 94 fL (ref 80.0–100.0)
Monocytes Absolute: 0.9 10*3/uL (ref 0.1–1.0)
Monocytes Relative: 9 %
Neutro Abs: 7 10*3/uL (ref 1.7–7.7)
Neutrophils Relative %: 74 %
Platelets: 449 10*3/uL — ABNORMAL HIGH (ref 150–400)
RBC: 4.52 MIL/uL (ref 4.22–5.81)
RDW: 14.2 % (ref 11.5–15.5)
WBC: 9.5 10*3/uL (ref 4.0–10.5)
nRBC: 0 % (ref 0.0–0.2)

## 2022-08-19 LAB — COMPREHENSIVE METABOLIC PANEL
ALT: 17 U/L (ref 0–44)
AST: 28 U/L (ref 15–41)
Albumin: 3.5 g/dL (ref 3.5–5.0)
Alkaline Phosphatase: 168 U/L — ABNORMAL HIGH (ref 38–126)
Anion gap: 11 (ref 5–15)
BUN: 22 mg/dL (ref 8–23)
CO2: 24 mmol/L (ref 22–32)
Calcium: 8.9 mg/dL (ref 8.9–10.3)
Chloride: 97 mmol/L — ABNORMAL LOW (ref 98–111)
Creatinine, Ser: 0.72 mg/dL (ref 0.61–1.24)
GFR, Estimated: >60 mL/min (ref >60)
Glucose, Bld: 121 mg/dL — ABNORMAL HIGH (ref 70–99)
Potassium: 3.9 mmol/L (ref 3.5–5.1)
Sodium: 132 mmol/L — ABNORMAL LOW (ref 135–145)
Total Bilirubin: 0.8 mg/dL (ref 0.3–1.2)
Total Protein: 7 g/dL (ref 6.5–8.1)

## 2022-08-19 MED ORDER — SODIUM CHLORIDE 0.9% FLUSH
10.0000 mL | Freq: Once | INTRAVENOUS | Status: AC
  Administered 2022-08-19: 10 mL via INTRAVENOUS
  Filled 2022-08-19: qty 10

## 2022-08-19 MED ORDER — HEPARIN SOD (PORK) LOCK FLUSH 100 UNIT/ML IV SOLN
500.0000 [IU] | Freq: Once | INTRAVENOUS | Status: AC
  Administered 2022-08-19: 500 [IU] via INTRAVENOUS
  Filled 2022-08-19: qty 5

## 2022-08-19 MED ORDER — SODIUM CHLORIDE 0.9 % IV SOLN
INTRAVENOUS | Status: DC
  Filled 2022-08-19 (×2): qty 250

## 2022-08-19 MED ORDER — DRONABINOL 2.5 MG PO CAPS: 2.5000 mg | ORAL_CAPSULE | Freq: Two times a day (BID) | ORAL | 0 refills | Status: DC

## 2022-08-19 NOTE — Progress Notes (Signed)
Symptom Management [place] at [facility] [phone] [phone]  Patient Care Team: Baxter Hire, MD as PCP - General (Internal Medicine) [name], RN as Oncology Nurse Navigator [name], MD as Consulting Physician (Hematology and Oncology) [name], RN as Registered Nurse (Oncology)   NAME OF PATIENT: Jason Warren  [id number]  1929-06-30   DATE OF VISIT: 08/19/22  REASON FOR CONSULT: Jason Warren is a 88 y.o. male with multiple medical problems including stage IV non-small cell lung cancer originally diagnosed November 2021 status post CarboTaxol RT.  Status post Imfinzi for 12 months.  Brain metastasis in March 2023 status post SBRT.  Patient most recently on treatment with Keytruda.  Patient is followed by ID for treatment of atypical mycobacterial skin infection -on treatment with Levaquin/Doxy/clarithromycin.  INTERVAL HISTORY: Patient last saw Dr. Rogue Bussing on 08/02/2022. Patient was on triple antibiotic therapy for atypical mycobacterial skin infection but was felt to be having GI symptoms from antibiotic regimen.  There was concern that immunotherapy would potentially exacerbate skin infection the patient ultimately opted to proceed with treatment.  ID discontinued Levaquin the patient continues on Doxy/clarithromycin.  He received cycle 2 Keytruda on 10/30.  Patient reports [facility] today with poor oral intake.  He says that he feels like he wants to eat but just does not have much of an appetite.  He is able to eat some fruit but finds most proteins to be distasteful.  He is drinking a boost once a day.  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  He does not feel like this is dramatically changed.  Denies recent fevers or illnesses. Denies any easy bleeding or bruising. Reports fair appetite and denies weight loss. Denies chest pain.  Denies any nausea, vomiting, constipation, or diarrhea. Denies urinary complaints. Patient offers no further specific complaints today.   PAST MEDICAL HISTORY: Past Medical History:  Diagnosis Date   Arthritis    Dysrhythmia    A-fib   Hip pain    left   HOH (hard of hearing)    LBBB (left bundle branch block) 08/05/2020   Lung cancer ([place])    Skin cancer, basal cell     face top of head    PAST SURGICAL HISTORY:  Past Surgical History:  Procedure Laterality Date   CATARACT EXTRACTION W/PHACO Right 07/13/2017   Procedure: CATARACT EXTRACTION PHACO AND INTRAOCULAR LENS PLACEMENT ([place]);  Surgeon: Leandrew Koyanagi, MD;  Location: [place];  Service: Ophthalmology;  Laterality: Right;  IVA TOPICAL RIGHT   CATARACT EXTRACTION W/PHACO Left 01/04/2018   Procedure: CATARACT EXTRACTION PHACO AND INTRAOCULAR LENS PLACEMENT ([place]) LEFT;  Surgeon: Leandrew Koyanagi, MD;  Location: [place];  Service: Ophthalmology;  Laterality: Left;   COLONOSCOPY     FEMUR FRACTURE SURGERY Right 01/03/2022   INTRAMEDULLARY (IM) NAIL INTERTROCHANTERIC Right 01/03/2022   Procedure: INTRAMEDULLARY (IM) NAIL INTERTROCHANTRIC;  Surgeon: Corky Mull, MD;  Location: [facility];  Service: Orthopedics;  Laterality: Right;   IR IMAGING GUIDED PORT INSERTION  08/27/2020   JOINT REPLACEMENT     Left total hip Dr. Su Hoff 08-04-18   MOHS SURGERY  06/01/2022   ROTATOR CUFF REPAIR Right    SKIN CANCER EXCISION     face   TOTAL HIP ARTHROPLASTY Left 08/04/2018   Procedure: TOTAL HIP ARTHROPLASTY ANTERIOR APPROACH;  Surgeon: Frederik Pear, MD;  Location: [facility];  Service: Orthopedics;  Laterality: Left;  VIDEO BRONCHOSCOPY WITH ENDOBRONCHIAL NAVIGATION N/A 08/07/2020   Procedure: VIDEO BRONCHOSCOPY WITH ENDOBRONCHIAL NAVIGATION;  Surgeon: Ottie Glazier, MD;  Location: [facility];  Service: Thoracic;  Laterality: N/A;   VIDEO BRONCHOSCOPY WITH ENDOBRONCHIAL ULTRASOUND N/A 08/07/2020    Procedure: VIDEO BRONCHOSCOPY WITH ENDOBRONCHIAL ULTRASOUND;  Surgeon: Ottie Glazier, MD;  Location: [facility];  Service: Thoracic;  Laterality: N/A;    HEMATOLOGY/ONCOLOGY HISTORY:  Oncology History Overview Note  # NOV 2021- LEFT LOWER LOBE NON-SMALL CELL CA [favor adeno ca]; T2N3 [right hilar; subcarinal LN;[name]; NOV 2021-MRI [place]  # 11/30- carbo-Taxol-RT [RT until 10/23/20]; s/p IMFINZI on 2/24. [09 months]; finished February 2023.  # #Brain metastases-March 2023-MRI-brain 2.1 cm peripherally enhancing lesion in the right paracentral lobule concerning for metastatic disease. There is mild perilesional edema and regional mass effect but no midline shift. On dexamethasone 2 mg a day.  Noted to have significant movement of the left lower extremity weakness.  Plan with SBRT on 3/22.  # SEP 20th, 2023- Keytruda [PDL-1:90%]  # Right lower lobe - ~4.6 x 3.1 cm  [PET 07-2020]- demonstrates no significant hypermetabolic activity (SUV max 1.8]-benign. STABLE.   # OCT [id number] Flock- disease in the tracheoesophageal groove-proceed with Keytruda  [PD-L1-90 percent]  # A.fib [Eliquis; Dr.Kowalski]  # # SURVIVORSHIP:   # GENETICS:   #NGS-ordered  DIAGNOSIS: Lung cancer  STAGE:   III      ;  GOALS:  cure  CURRENT/MOST RECENT THERAPY : carbo-Taxol-RT ]   Stage 4 lung cancer, left ([place])  08/14/2020 Initial Diagnosis   Cancer of lower lobe of left lung ([place])   09/02/2020 - 10/22/2020 Chemotherapy    Patient is on Treatment Plan: LUNG DURVALUMAB Q14D      10/16/2020 Cancer Staging   Staging form: Lung, AJCC 8th Edition - Clinical: Stage IIIB (cT2a, cN3, cM0) - Signed by [name], MD on 10/16/2020   12/17/2020 - 11/27/2021 Chemotherapy   Patient is on Treatment Plan : LUNG Durvalumab q14d     07/09/2022 -  Chemotherapy   Patient is on Treatment Plan : LUNG NSCLC Pembrolizumab (200) q21d       ALLERGIES:  has No Known Allergies.  MEDICATIONS:  Current  Outpatient Medications  Medication Sig Dispense Refill   apixaban (ELIQUIS) 5 MG TABS tablet Take 5 mg by mouth 2 (two) times daily.     clarithromycin (BIAXIN) 500 MG tablet Take 1 tablet (500 mg total) by mouth 2 (two) times daily. 60 tablet 2   doxycycline (ADOXA) 100 MG tablet Take 1 tablet (100 mg total) by mouth 2 (two) times daily. 60 tablet 2   furosemide (LASIX) 20 MG tablet Take 1 tablet (20 mg total) by mouth daily. 30 tablet 3   hydroxypropyl methylcellulose / hypromellose (ISOPTO TEARS / GONIOVISC) 2.5 % ophthalmic solution Place 1 drop into the left eye daily as needed for dry eyes.     lidocaine-prilocaine (EMLA) cream Apply 1 Application topically as needed. Apply small amount to port site at least 1 hour prior to it being accessed, cover with plastic wrap 30 g 1   mirtazapine (REMERON) 7.5 MG tablet Take 1 tablet (7.5 mg total) by mouth at bedtime. 30 tablet 1   ondansetron (ZOFRAN) 8 MG tablet One pill every 8 hours as needed for nausea/vomitting. 40 tablet 1   pravastatin (PRAVACHOL) 80 MG tablet Take 80 mg by mouth at bedtime.      prochlorperazine (COMPAZINE) 10 MG tablet Take 0.5-1 tablets (5-10 mg total) by  mouth every 6 (six) hours as needed for nausea or vomiting. 30 tablet 0   saw palmetto 160 MG capsule Take 160 mg by mouth 2 (two) times daily.     traZODone (DESYREL) 50 MG tablet Take 1 tablet (50 mg total) by mouth at bedtime as needed for sleep. 30 tablet 3   acetaminophen (TYLENOL) 500 MG tablet Take 1 tablet (500 mg total) by mouth every 6 (six) hours as needed. (Patient not taking: Reported on 08/19/2022) 30 tablet 0   gentamicin ointment (GARAMYCIN) 0.1 % Apply 1 Application topically 3 (three) times daily. (Patient not taking: Reported on 08/19/2022) 15 g 0   Multiple Vitamin (MULTIVITAMIN) tablet Take 1 tablet by mouth daily. (Patient not taking: Reported on 08/19/2022)     No current facility-administered medications for this visit.   Facility-Administered  Medications Ordered in Other Visits  Medication Dose Route Frequency Provider Last Rate Last Admin   heparin lock flush 100 UNIT/ML injection            heparin lock flush 100 unit/mL  500 Units Intravenous Once Charlaine Dalton R, MD       heparin lock flush 100 unit/mL  500 Units Intravenous Once Nastashia Gallo, Kirt Boys, NP       sodium chloride flush (NS) 0.9 % injection 10 mL  10 mL Intravenous PRN [name], MD   10 mL at 02/11/21 0835   sodium chloride flush (NS) 0.9 % injection 10 mL  10 mL Intravenous Once Callee Rohrig, Kirt Boys, NP        VITAL SIGNS: BP 101/61   Pulse 76   Temp (!) 96 F (35.6 C) (Tympanic)   Resp 20   Ht 6' (1.829 m)   Wt 149 lb (67.6 kg)   BMI 20.21 kg/m  Filed Weights   08/19/22 1027  Weight: 149 lb (67.6 kg)    Estimated body mass index is 20.21 kg/m as calculated from the following:   Height as of this encounter: 6' (1.829 m).   Weight as of this encounter: 149 lb (67.6 kg).  LABS: CBC:    Component Value Date/Time   WBC 9.5 08/19/2022 1006   HGB 13.8 08/19/2022 1006   HCT 42.5 08/19/2022 1006   PLT 449 (H) 08/19/2022 1006   MCV 94.0 08/19/2022 1006   NEUTROABS 7.0 08/19/2022 1006   LYMPHSABS 1.4 08/19/2022 1006   MONOABS 0.9 08/19/2022 1006   EOSABS 0.1 08/19/2022 1006   BASOSABS 0.0 08/19/2022 1006   Comprehensive Metabolic Panel:    Component Value Date/Time   NA 132 (L) 08/19/2022 1006   K 3.9 08/19/2022 1006   CL 97 (L) 08/19/2022 1006   CO2 24 08/19/2022 1006   BUN 22 08/19/2022 1006   CREATININE 0.72 08/19/2022 1006   GLUCOSE 121 (H) 08/19/2022 1006   CALCIUM 8.9 08/19/2022 1006   AST 28 08/19/2022 1006   ALT 17 08/19/2022 1006   ALKPHOS 168 (H) 08/19/2022 1006   BILITOT 0.8 08/19/2022 1006   PROT 7.0 08/19/2022 1006   ALBUMIN 3.5 08/19/2022 1006    RADIOGRAPHIC STUDIES: No results found.  PERFORMANCE STATUS (ECOG) : 2 - Symptomatic, <50% confined to bed  Review of Systems Unless otherwise noted, a complete  review of systems is negative.  Physical Exam General: NAD Cardiovascular: regular rate and rhythm Pulmonary: clear ant fields Abdomen: soft, nontender, + bowel sounds GU: no suprapubic tenderness Extremities: no edema, no joint deformities Skin: Multiple raised, erythematous, nondraining lesions to legs/arms Neurological:  Weakness but otherwise nonfocal  IMPRESSION/PLAN: Poor oral intake-likely multifactorial but probably primarily stemming from his cancer.  He would benefit from re imaging but will defer to Dr. B. Patient has not found significant improvement on mirtazapine so we will discontinue.  We will trial Marinol.  Recommend increasing boost supplementation to 2-3 times daily.  We will send a new referral for nutrition.  Patient would likely benefit from intermittent supportive care visits/IV fluids.  We will proceed with IV fluids today.  Mycobacterial skin lesions -on antibiotic therapy and followed by ID.    RTC next week for follow-up as previously scheduled  Patient expressed understanding and was in agreement with this plan. He also understands that He can call clinic at any time with any questions, concerns, or complaints.   Thank you for allowing me to participate in the care of this very pleasant patient.   Time Total: 15 minutes  Visit consisted of counseling and education dealing with the complex and emotionally intense issues of symptom management in the setting of serious illness.Greater than 50%  of this time was spent counseling and coordinating care related to the above assessment and plan.  Signed by: Altha Harm, PhD, NP-C

## 2022-08-19 NOTE — Telephone Encounter (Signed)
[name] [name] - Rx #: [id number] PA submitted for the [facility]   Your information has been submitted to [place]. [employer] will review the request and notify you of the determination decision directly, typically within 3 business days of your submission and once all necessary information is received.  You will also receive your request decision electronically. To check for an update later, open the request again from your dashboard.  If [employer] [place] has not responded within the specified timeframe or if you have any questions about your PA submission, contact [place] directly at [facility]) [phone] or ([facility]) [phone].

## 2022-08-19 NOTE — Progress Notes (Signed)
Nutrition Assessment:  Patient with poor po intake, add on today.  88 year old male with stage IV non small cell lung cancer, brain mets s/p SBRT.  Currently receiving [place].  On antibiotics for atypical mycobacterial skin infection.    Met with patients daughter.  Patient with poor po intake, wants to eat but gets it in front of him and can't eat.  Issues with difficulty swallowing with known adenopathy adjacent to esophagus. Eating peaches with ice cream, orange slices, yogurt, chicken broth, boost shake, tomato soup but volume extremely low.  Patient feels antibiotics have effected taste  Medications: marinol added today  Labs: reviewed  Anthropometrics:   Height: 72 inches Weight: 149 lb 9.6 oz  162 lb 04/09/22 BMI: 20  8% weight loss in the last 4 months, significant   NUTRITION DIAGNOSIS: Inadequate oral intake related to cancer and cancer related treatment side effects/possible antibiotics as evidenced by 8% weight loss in the last 4 months and poor po intake   INTERVENTION:  Recommend Boost VHC, [employer] 1.4 shake (higher calories) and provided samples today to daughter. Provided recipe booklet on shakes, smoothies and other high calorie foods Discussed pureeing, chopping, grinding foods for ease of swallowing Encouraged nothing lowfat, utilizing gravies, sauces, butters for added calories at this time Small frequent nibbles. Discussed protein powders Contact information provided    MONITORING, EVALUATION, GOAL: weight trends, intake   NEXT VISIT: as needed  Myrle Dues B. Zenia Resides, [place], [place] Registered Dietitian [phone]

## 2022-08-23 ENCOUNTER — Telehealth: Payer: Self-pay | Admitting: *Deleted

## 2022-08-23 ENCOUNTER — Other Ambulatory Visit: Payer: Self-pay | Admitting: *Deleted

## 2022-08-23 ENCOUNTER — Inpatient Hospital Stay (HOSPITAL_BASED_OUTPATIENT_CLINIC_OR_DEPARTMENT_OTHER): Payer: Medicare Other | Admitting: Medical Oncology

## 2022-08-23 ENCOUNTER — Encounter: Payer: Self-pay | Admitting: Medical Oncology

## 2022-08-23 ENCOUNTER — Inpatient Hospital Stay: Payer: Medicare Other

## 2022-08-23 VITALS — BP 115/68 | HR 87 | Temp 96.7°F | Wt 150.0 lb

## 2022-08-23 DIAGNOSIS — R630 Anorexia: Secondary | ICD-10-CM | POA: Diagnosis not present

## 2022-08-23 DIAGNOSIS — C3492 Malignant neoplasm of unspecified part of left bronchus or lung: Secondary | ICD-10-CM

## 2022-08-23 DIAGNOSIS — Z09 Encounter for follow-up examination after completed treatment for conditions other than malignant neoplasm: Secondary | ICD-10-CM

## 2022-08-23 DIAGNOSIS — Z9189 Other specified personal risk factors, not elsewhere classified: Secondary | ICD-10-CM

## 2022-08-23 DIAGNOSIS — Z95828 Presence of other vascular implants and grafts: Secondary | ICD-10-CM

## 2022-08-23 DIAGNOSIS — C3490 Malignant neoplasm of unspecified part of unspecified bronchus or lung: Secondary | ICD-10-CM

## 2022-08-23 DIAGNOSIS — L989 Disorder of the skin and subcutaneous tissue, unspecified: Secondary | ICD-10-CM | POA: Diagnosis not present

## 2022-08-23 DIAGNOSIS — K208 Other esophagitis without bleeding: Secondary | ICD-10-CM

## 2022-08-23 DIAGNOSIS — T66XXXA Radiation sickness, unspecified, initial encounter: Secondary | ICD-10-CM

## 2022-08-23 DIAGNOSIS — Z5112 Encounter for antineoplastic immunotherapy: Secondary | ICD-10-CM | POA: Diagnosis not present

## 2022-08-23 DIAGNOSIS — R634 Abnormal weight loss: Secondary | ICD-10-CM

## 2022-08-23 DIAGNOSIS — E860 Dehydration: Secondary | ICD-10-CM

## 2022-08-23 DIAGNOSIS — C7931 Secondary malignant neoplasm of brain: Secondary | ICD-10-CM

## 2022-08-23 LAB — CBC WITH DIFFERENTIAL/PLATELET
Abs Immature Granulocytes: 0.11 10*3/uL — ABNORMAL HIGH (ref 0.00–0.07)
Basophils Absolute: 0.1 10*3/uL (ref 0.0–0.1)
Basophils Relative: 1 %
Eosinophils Absolute: 0.1 10*3/uL (ref 0.0–0.5)
Eosinophils Relative: 1 %
HCT: 41.8 % (ref 39.0–52.0)
Hemoglobin: 13.6 g/dL (ref 13.0–17.0)
Immature Granulocytes: 1 %
Lymphocytes Relative: 14 %
Lymphs Abs: 1.5 10*3/uL (ref 0.7–4.0)
MCH: 30.6 pg (ref 26.0–34.0)
MCHC: 32.5 g/dL (ref 30.0–36.0)
MCV: 93.9 fL (ref 80.0–100.0)
Monocytes Absolute: 0.9 10*3/uL (ref 0.1–1.0)
Monocytes Relative: 8 %
Neutro Abs: 8.3 10*3/uL — ABNORMAL HIGH (ref 1.7–7.7)
Neutrophils Relative %: 75 %
Platelets: 414 10*3/uL — ABNORMAL HIGH (ref 150–400)
RBC: 4.45 MIL/uL (ref 4.22–5.81)
RDW: 14.3 % (ref 11.5–15.5)
WBC: 10.9 10*3/uL — ABNORMAL HIGH (ref 4.0–10.5)
nRBC: 0 % (ref 0.0–0.2)

## 2022-08-23 LAB — COMPREHENSIVE METABOLIC PANEL
ALT: 15 U/L (ref 0–44)
AST: 26 U/L (ref 15–41)
Albumin: 3.5 g/dL (ref 3.5–5.0)
Alkaline Phosphatase: 167 U/L — ABNORMAL HIGH (ref 38–126)
Anion gap: 9 (ref 5–15)
BUN: 23 mg/dL (ref 8–23)
CO2: 25 mmol/L (ref 22–32)
Calcium: 9 mg/dL (ref 8.9–10.3)
Chloride: 100 mmol/L (ref 98–111)
Creatinine, Ser: 0.83 mg/dL (ref 0.61–1.24)
GFR, Estimated: >60 mL/min (ref >60)
Glucose, Bld: 140 mg/dL — ABNORMAL HIGH (ref 70–99)
Potassium: 4.2 mmol/L (ref 3.5–5.1)
Sodium: 134 mmol/L — ABNORMAL LOW (ref 135–145)
Total Bilirubin: 0.7 mg/dL (ref 0.3–1.2)
Total Protein: 7 g/dL (ref 6.5–8.1)

## 2022-08-23 LAB — TSH: TSH: 5.942 u[IU]/mL — ABNORMAL HIGH (ref 0.350–4.500)

## 2022-08-23 MED ORDER — DRONABINOL 2.5 MG PO CAPS: 2.5000 mg | ORAL_CAPSULE | Freq: Two times a day (BID) | ORAL | 0 refills | Status: DC

## 2022-08-23 MED ORDER — SODIUM CHLORIDE 0.9 % IV SOLN
Freq: Once | INTRAVENOUS | Status: DC
  Filled 2022-08-23: qty 250

## 2022-08-23 MED ORDER — HEPARIN SOD (PORK) LOCK FLUSH 100 UNIT/ML IV SOLN
500.0000 [IU] | Freq: Once | INTRAVENOUS | Status: AC | PRN
  Administered 2022-08-23: 500 [IU]
  Filled 2022-08-23: qty 5

## 2022-08-23 MED ORDER — SODIUM CHLORIDE 0.9 % IV SOLN
200.0000 mg | Freq: Once | INTRAVENOUS | Status: AC
  Administered 2022-08-23: 200 mg via INTRAVENOUS
  Filled 2022-08-23: qty 8

## 2022-08-23 MED ORDER — SODIUM CHLORIDE 0.9 % IV SOLN
INTRAVENOUS | Status: DC
  Filled 2022-08-23 (×2): qty 250

## 2022-08-23 NOTE — Telephone Encounter (Signed)
Jason Warren ([name]) - [id number] droNABinol 2.5MG  capsules Status: PA Response - ApprovedCreated: November 16th, 2023 [id number] Sent: November 16th, 2023 Open  Archive

## 2022-08-23 NOTE — Patient Instructions (Signed)
[facility] AT [place]  Discharge Instructions: Thank you for choosing [facility] to provide your oncology and hematology care.  If you have a lab appointment with the [facility], please go directly to the [facility] and check in at the registration area.  Wear comfortable clothing and clothing appropriate for easy access to any Portacath or PICC line.   We strive to give you quality time with your provider. You may need to reschedule your appointment if you arrive late (15 or more minutes).  Arriving late affects you and other patients whose appointments are after yours.  Also, if you miss three or more appointments without notifying the office, you may be dismissed from the clinic at the provider's discretion.      For prescription refill requests, have your pharmacy contact our office and allow 72 hours for refills to be completed.    To help prevent nausea and vomiting after your treatment, we encourage you to take your nausea medication as directed.  BELOW ARE SYMPTOMS THAT SHOULD BE REPORTED IMMEDIATELY: *FEVER GREATER THAN 100.4 F (38 C) OR HIGHER *CHILLS OR SWEATING *NAUSEA AND VOMITING THAT IS NOT CONTROLLED WITH YOUR NAUSEA MEDICATION *UNUSUAL SHORTNESS OF BREATH *UNUSUAL BRUISING OR BLEEDING *URINARY PROBLEMS (pain or burning when urinating, or frequent urination) *BOWEL PROBLEMS (unusual diarrhea, constipation, pain near the anus) TENDERNESS IN MOUTH AND THROAT WITH OR WITHOUT PRESENCE OF ULCERS (sore throat, sores in mouth, or a toothache) UNUSUAL RASH, SWELLING OR PAIN   Items with * indicate a potential emergency and should be followed up as soon as possible or go to the Emergency Department if any problems should occur.  Please show the CHEMOTHERAPY ALERT CARD or IMMUNOTHERAPY ALERT CARD at check-in to the Emergency Department and triage nurse.  Should you have questions after your visit or need to cancel or reschedule your  appointment, please contact [facility] [place] AT [place]  [phone] and follow the prompts.  Office hours are 8:00 a.m. to 4:30 p.m. Monday - Friday. Please note that voicemails left after 4:00 p.m. may not be returned until the following business day.  We are closed weekends and major holidays. You have access to a nurse at all times for urgent questions. Please call the main number to the clinic [phone] and follow the prompts.  For any non-urgent questions, you may also contact your provider using MyChart. We now offer e-Visits for anyone 8 and older to request care online for non-urgent symptoms. For details visit [email or web address].   Also download the MyChart app! Go to the app store, search "MyChart", open the app, select [facility], and log in with your MyChart username and password.  Masks are optional in the cancer centers. If you would like for your care team to wear a mask while they are taking care of you, please let them know. For doctor visits, patients may have with them one support person who is at least 86 years old. At this time, visitors are not allowed in the infusion area.

## 2022-08-23 NOTE — Telephone Encounter (Signed)
Per daughter's request. I personally reached out to [name] pt's daughter to discuss purpose of the future apts. Daughter stated pt doesn't want to really come on Wednesday and doesn't think he needs IV fluids. Explained to daughter that due to decrease intake, provider recommended iv fluids twice weekly. She gave verbal understanding and also inquired about the [employer]. I explained that josh, np sent the script to [facility] on [address]. She thanked me for calling her back.

## 2022-08-23 NOTE — Progress Notes (Addendum)
[facility] NOTE  Patient Care Team: Baxter Hire, MD as PCP - General (Internal Medicine) [name], RN as Oncology Nurse Navigator [name], MD as Consulting Physician (Hematology and Oncology) [name], RN as Registered Nurse (Oncology)  CHIEF COMPLAINTS/PURPOSE OF CONSULTATION: Lung cancer  #  Oncology History Overview Note  # NOV 2021- LEFT LOWER LOBE NON-SMALL CELL CA [favor adeno ca]; T2N3 [right hilar; subcarinal LN;[name]; NOV 2021-MRI [place]  # 11/30- carbo-Taxol-RT [RT until 10/23/20]; s/p IMFINZI on 2/24. [69 months]; finished February 2023.  # #Brain metastases-March 2023-MRI-brain 2.1 cm peripherally enhancing lesion in the right paracentral lobule concerning for metastatic disease. There is mild perilesional edema and regional mass effect but no midline shift. On dexamethasone 2 mg a day.  Noted to have significant movement of the left lower extremity weakness.  Plan with SBRT on 3/22.  # SEP 20th, 2023- Keytruda [PDL-1:90%]  # Right lower lobe - ~4.6 x 3.1 cm  [PET 07-2020]- demonstrates no significant hypermetabolic activity (SUV max 1.8]-benign. STABLE.   # OCT [id number] Flock- disease in the tracheoesophageal groove-proceed with Keytruda  [PD-L1-90 percent]  # A.fib [Eliquis; Dr.Kowalski]  # # SURVIVORSHIP:   # GENETICS:   #NGS-ordered  DIAGNOSIS: Lung cancer  STAGE:   III      ;  GOALS:  cure  CURRENT/MOST RECENT THERAPY : carbo-Taxol-RT ]   Stage 4 lung cancer, left ([place])  08/14/2020 Initial Diagnosis   Cancer of lower lobe of left lung ([place])   09/02/2020 - 10/22/2020 Chemotherapy    Patient is on Treatment Plan: LUNG DURVALUMAB Q14D      10/16/2020 Cancer Staging   Staging form: Lung, AJCC 8th Edition - Clinical: Stage IIIB (cT2a, cN3, cM0) - Signed by [name], MD on 10/16/2020   12/17/2020 - 11/27/2021 Chemotherapy   Patient is on Treatment Plan : LUNG Durvalumab  q14d     07/09/2022 -  Chemotherapy   Patient is on Treatment Plan : LUNG NSCLC Pembrolizumab (200) q21d      HISTORY OF PRESENTING ILLNESS: Patient is ambulating with a cane.  He is accompanied by his daughter.   [name] 88 y.o.  male patient with currently stage IV [metachronus stage III lung non-small cell lung; s/p treatment feb 2023 with Brain mets status post [facility] MARCH 2023 is here for a follow up/proceed with immunotherapy.  Continues to declines slowly per patient and daughter though weight is stable. Having trouble eating most solids. Drinking two boosts per day and eating things like ice cream, etc. Mornings are better for him and by lunch his nausea creeps in. Trying his home antiemetics with little improvement- hesitant to try others. Is seen by palliative and nutrition. At his last visit it was recommended that he stop his mirtazapine as this was not very effective at improving his appetite and starting Marinol. He reports today that he wants to give the Mirtazapine a bit more time before switching. He has also discussed repeat imaging both with Dr. Rogue Bussing as well Lindi Adie in palliative care but he wishes to hold off on this at this time. He wishes to continue his Beryle Flock which he reports he is tolerating well. No fevers, night sweats, vomiting, diarrhea. Stable chronic constipation.   Wt Readings from Last 3 Encounters:  08/23/22 150 lb (68 kg)  08/19/22 149 lb (67.6 kg)  08/19/22 146 lb 9.6 oz (66.5 kg)     Review of Systems  Constitutional:  Positive  for malaise/fatigue. Negative for chills, diaphoresis, fever and weight loss.  HENT:  Negative for nosebleeds and sore throat.   Eyes:  Negative for double vision.  Respiratory:  Negative for hemoptysis, sputum production, shortness of breath and wheezing.   Cardiovascular:  Positive for leg swelling. Negative for chest pain, palpitations and orthopnea.  Gastrointestinal:  Positive for nausea. Negative for  abdominal pain, blood in stool, constipation, diarrhea, heartburn, melena and vomiting.  Genitourinary:  Negative for dysuria and urgency.  Musculoskeletal:  Negative for back pain and joint pain.  Skin:  Negative for itching.  Neurological:  Positive for focal weakness. Negative for tingling, weakness and headaches.  Endo/Heme/Allergies:  Does not bruise/bleed easily.  Psychiatric/Behavioral:  Negative for depression. The patient is not nervous/anxious and does not have insomnia.      MEDICAL HISTORY:  Past Medical History:  Diagnosis Date   Arthritis    Dysrhythmia    A-fib   Hip pain    left   HOH (hard of hearing)    LBBB (left bundle branch block) 08/05/2020   Lung cancer ([place])    Skin cancer, basal cell     face top of head    SURGICAL HISTORY: Past Surgical History:  Procedure Laterality Date   CATARACT EXTRACTION W/PHACO Right 07/13/2017   Procedure: CATARACT EXTRACTION PHACO AND INTRAOCULAR LENS PLACEMENT ([place]);  Surgeon: Leandrew Koyanagi, MD;  Location: [facility];  Service: Ophthalmology;  Laterality: Right;  IVA TOPICAL RIGHT   CATARACT EXTRACTION W/PHACO Left 01/04/2018   Procedure: CATARACT EXTRACTION PHACO AND INTRAOCULAR LENS PLACEMENT ([place]) LEFT;  Surgeon: Leandrew Koyanagi, MD;  Location: [facility];  Service: Ophthalmology;  Laterality: Left;   COLONOSCOPY     FEMUR FRACTURE SURGERY Right 01/03/2022   INTRAMEDULLARY (IM) NAIL INTERTROCHANTERIC Right 01/03/2022   Procedure: INTRAMEDULLARY (IM) NAIL INTERTROCHANTRIC;  Surgeon: Corky Mull, MD;  Location: [facility];  Service: Orthopedics;  Laterality: Right;   IR IMAGING GUIDED PORT INSERTION  08/27/2020   JOINT REPLACEMENT     Left total hip Dr. Su Hoff 08-04-18   MOHS SURGERY  06/01/2022   ROTATOR CUFF REPAIR Right    SKIN CANCER EXCISION     face   TOTAL HIP ARTHROPLASTY Left 08/04/2018   Procedure: TOTAL HIP ARTHROPLASTY ANTERIOR APPROACH;  Surgeon: Frederik Pear, MD;   Location: [facility];  Service: Orthopedics;  Laterality: Left;   VIDEO BRONCHOSCOPY WITH ENDOBRONCHIAL NAVIGATION N/A 08/07/2020   Procedure: VIDEO BRONCHOSCOPY WITH ENDOBRONCHIAL NAVIGATION;  Surgeon: Ottie Glazier, MD;  Location: [facility];  Service: Thoracic;  Laterality: N/A;   VIDEO BRONCHOSCOPY WITH ENDOBRONCHIAL ULTRASOUND N/A 08/07/2020   Procedure: VIDEO BRONCHOSCOPY WITH ENDOBRONCHIAL ULTRASOUND;  Surgeon: Ottie Glazier, MD;  Location: [facility];  Service: Thoracic;  Laterality: N/A;    SOCIAL HISTORY: Social History   Socioeconomic History   Marital status: Married    Spouse name: Not on file   Number of children: Not on file   Years of education: Not on file   Highest education level: Not on file  Occupational History   Not on file  Tobacco Use   Smoking status: Former    Packs/day: 1.00    Years: 4.00    Total pack years: 4.00    Types: Cigarettes   Smokeless tobacco: Never   Tobacco comments:    quit early 70's  Vaping Use   Vaping Use: Never used  Substance and Sexual Activity   Alcohol use: No   Drug use: No  Sexual activity: Not Currently  Other Topics Concern   Not on file  Social History Narrative   > quit 35 years; smoked for 15 years. Rare alcohol. 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    Social Determinants of Health   Financial Resource Strain: Not on file  Food Insecurity: Not on file  Transportation Needs: Not on file  Physical Activity: Not on file  Stress: Not on file  Social Connections: Not on file  Intimate Partner Violence: Not on file    FAMILY HISTORY: Family History  Problem Relation Age of Onset   Throat cancer Brother         & lung cancer    ALLERGIES:  has No Known Allergies.  MEDICATIONS:  Current Outpatient Medications  Medication Sig Dispense Refill   apixaban (ELIQUIS) 5 MG TABS tablet Take 5 mg by mouth 2 (two) times daily.     clarithromycin  (BIAXIN) 500 MG tablet Take 1 tablet (500 mg total) by mouth 2 (two) times daily. 60 tablet 2   doxycycline (ADOXA) 100 MG tablet Take 1 tablet (100 mg total) by mouth 2 (two) times daily. 60 tablet 2   dronabinol (MARINOL) 2.5 MG capsule Take 1 capsule (2.5 mg total) by mouth 2 (two) times daily before a meal. 60 capsule 0   furosemide (LASIX) 20 MG tablet Take 1 tablet (20 mg total) by mouth daily. 30 tablet 3   hydroxypropyl methylcellulose / hypromellose (ISOPTO TEARS / GONIOVISC) 2.5 % ophthalmic solution Place 1 drop into the left eye daily as needed for dry eyes.     lidocaine-prilocaine (EMLA) cream Apply 1 Application topically as needed. Apply small amount to port site at least 1 hour prior to it being accessed, cover with plastic wrap 30 g 1   Multiple Vitamin (MULTIVITAMIN) tablet Take 1 tablet by mouth daily.     ondansetron (ZOFRAN) 8 MG tablet One pill every 8 hours as needed for nausea/vomitting. 40 tablet 1   pravastatin (PRAVACHOL) 80 MG tablet Take 80 mg by mouth at bedtime.      prochlorperazine (COMPAZINE) 10 MG tablet Take 0.5-1 tablets (5-10 mg total) by mouth every 6 (six) hours as needed for nausea or vomiting. 30 tablet 0   saw palmetto 160 MG capsule Take 160 mg by mouth 2 (two) times daily.     traZODone (DESYREL) 50 MG tablet Take 1 tablet (50 mg total) by mouth at bedtime as needed for sleep. 30 tablet 3   acetaminophen (TYLENOL) 500 MG tablet Take 1 tablet (500 mg total) by mouth every 6 (six) hours as needed. (Patient not taking: Reported on 08/19/2022) 30 tablet 0   gentamicin ointment (GARAMYCIN) 0.1 % Apply 1 Application topically 3 (three) times daily. (Patient not taking: Reported on 08/19/2022) 15 g 0   No current facility-administered medications for this visit.   Facility-Administered Medications Ordered in Other Visits  Medication Dose Route Frequency Provider Last Rate Last Admin   0.9 %  sodium chloride infusion   Intravenous Once Charlaine Dalton R,  MD       0.9 %  sodium chloride infusion   Intravenous Continuous Georg Ang M, PA-C       heparin lock flush 100 UNIT/ML injection            heparin lock flush 100 unit/mL  500 Units Intravenous Once [name], MD       pembrolizumab (  KEYTRUDA) 200 mg in sodium chloride 0.9 % 50 mL chemo infusion  200 mg Intravenous Once Charlaine Dalton R, MD       sodium chloride flush (NS) 0.9 % injection 10 mL  10 mL Intravenous PRN Charlaine Dalton R, MD   10 mL at 02/11/21 0835   sodium chloride flush (NS) 0.9 % injection 10 mL  10 mL Intravenous Once Borders, Kirt Boys, NP       PHYSICAL EXAMINATION: ECOG PERFORMANCE STATUS: 1 - Symptomatic but completely ambulatory  Vitals:   08/23/22 0917  BP: 115/68  Pulse: 87  Temp: (!) 96.7 F (35.9 C)   Filed Weights   08/23/22 0917  Weight: 150 lb (68 kg)   Bilateral upper lower extremity boils noted.  Physical Exam Vitals and nursing note reviewed.  Constitutional:      Comments: Thin, frail. Multiple crusted skin lesions.   HENT:     Head: Normocephalic and atraumatic.     Mouth/Throat:     Pharynx: No oropharyngeal exudate.  Eyes:     Pupils: Pupils are equal, round, and reactive to light.  Cardiovascular:     Rate and Rhythm: Normal rate and regular rhythm.  Pulmonary:     Effort: Pulmonary effort is normal. No respiratory distress.     Breath sounds: No wheezing.     Comments: Slightly decreased breath sounds left lower lung base. Abdominal:     General: Bowel sounds are normal. There is no distension.     Palpations: Abdomen is soft. There is no mass.     Tenderness: There is no abdominal tenderness. There is no guarding or rebound.  Musculoskeletal:        General: No tenderness. Normal range of motion.     Cervical back: Normal range of motion and neck supple.  Skin:    General: Skin is warm.  Neurological:     Mental Status: He is alert and oriented to person, place, and time.  Psychiatric:        Mood  and Affect: Affect normal.      LABORATORY DATA:  I have reviewed the data as listed Lab Results  Component Value Date   WBC 10.9 (H) 08/23/2022   HGB 13.6 08/23/2022   HCT 41.8 08/23/2022   MCV 93.9 08/23/2022   PLT 414 (H) 08/23/2022   Recent Labs    08/02/22 0856 08/19/22 1006 08/23/22 0856  NA 131* 132* 134*  K 4.3 3.9 4.2  CL 99 97* 100  CO2 _0 GLUCOSE 117* 121* 140*  BUN _1 CREATININE 0.84 0.72 0.83  CALCIUM 8.4* 8.9 9.0  GFRNONAA >60 >60 >60  PROT 7.0 7.0 7.0  ALBUMIN 3.7 3.5 3.5  AST _2 ALT _3 ALKPHOS 160* 168* 167*  BILITOT 0.7 0.8 0.7    RADIOGRAPHIC STUDIES: I have personally reviewed the radiological images as listed and agreed with the findings in the report. No results found.   ASSESSMENT & PLAN:   Stage 4 lung cancer, left ([place]) Stage IV- -non-small cell lung cancer-[favor adenocarcinoma] [at dx-stage III- s/p  concurrent chemoradiation; s/p  adjuvant durvalumab; finished Feb 2023]; with metachronous brain metastases [March 2023];   AUG-2023-CT/PET scan-  Tracheal esophageal lymph node displays moderate increased metabolic activity in signs of central necrosis compatible with metastatic lymph node. Signs of bony metastasis to the LEFT olecranon.  Patient currently on single agent Keytruda [ PD-L1-90 percent].   #Patient s/p  currently cycle #2 of Keytruda.  He is tolerating this well. He wishes to proceed forward with Cycle 3 Day 1 Keytruda therapy today. Labs reviewed with patient and acceptable for treatment. Skin lesions do not seem to be worsening. We will continue to monitor. Per Dr. Darlin Priestly last note plan is to re-image after 3-4 treatments. They wish to discuss this with Dr. Rogue Bussing at their next follow up with him.     #Left olecranon metastases-continue monitoring for pain; if worse would recommend radiation. Stable at this time- no change to plan.    # Brain metastases--s/p SBRT [March, 23rrd-2023] AUG  2023- Continued interval decrease in size of a peripherally enhancing right frontal lobe metastasis, now measuring 9 x 8 mm. Surrounding edema persists, but has also decreased.  Today they wish to hold off until they can discuss further with Dr. Rogue Bussing at his next visit. Per his last note he would be due for MRI imaging after today's appointment.    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 Currently OFF levaquin [stopped 10/26]-overall improved. This is stable at this time.    # Poor appetite- discussed possible adrenal insuff; [leg sweeling on steroids]. Mirtazipine minimally effective. Weight is stable now which is good and does not appear to be due to fluid retention. For now patient elects to continue the Mirtazipine and hold off of on the Marinol at this time. Scheduling a follow up visit with palliative as well as with his medical oncologist Dr. Rogue Bussing to discuss further at a later date.    # Constipation: Status post laxatives stool softener in the past.  Miralax- recommend BID- TID prn; if not improved more frequently. No changes to this plan at this time. Hoping that twice weekly IVF may help.    # insomnia- Bil LE swelling: sec to steroids-Tapered off-mirtazapine should improve.  Appetite and also sleep. At this time this is no longer a concern for patient and daughter.    # A. fib on Eliquis [Dr.Kowalski]-  Mild swelling in legs- EF in Feb 2022- 55-60%- STABLE. No change to plan at this time   # Right hip fracture status post ORIF.  Nonpathologic. On  physical therapy- STABLE. No change to plan at this time.    # MediPort flush q 6 W-stable.   # Vaccination- flu shots; covid- OK.    # DISPOSITION: # Cycle 3 Day 1 Keytruda today along with IVF RTC Wed palliative with josh and fluids RTC 2 times per week APP, port labs(CBC w/, CMP, mag), Fluids  RTC Dr. B asap  when he returns to discuss imaging, labs, fluids (this can be one of the 2 times per week visits) RTC APP, labs (CBC w/, CMP, TSH), fluids, keytruda 3 weeks-[place]   # I reviewed the blood work- with the patient in detail; also reviewed the imaging independently [as summarized above]; and with the patient in detail.      All questions were answered. The patient knows to call the clinic with any problems, questions or concerns.   Hughie Closs, PA-C 08/23/2022 10:08 AM

## 2022-08-24 LAB — T4: T4, Total: 6.5 ug/dL (ref 4.5–12.0)

## 2022-08-25 ENCOUNTER — Other Ambulatory Visit: Payer: Self-pay | Admitting: *Deleted

## 2022-08-25 ENCOUNTER — Inpatient Hospital Stay: Payer: Medicare Other | Admitting: Medical Oncology

## 2022-08-25 ENCOUNTER — Inpatient Hospital Stay: Payer: Medicare Other

## 2022-08-25 DIAGNOSIS — E860 Dehydration: Secondary | ICD-10-CM

## 2022-08-30 ENCOUNTER — Inpatient Hospital Stay: Payer: Medicare Other

## 2022-08-30 ENCOUNTER — Inpatient Hospital Stay: Payer: Medicare Other | Admitting: Medical Oncology

## 2022-08-30 ENCOUNTER — Encounter: Payer: Self-pay | Admitting: Internal Medicine

## 2022-08-31 ENCOUNTER — Encounter: Payer: Self-pay | Admitting: Infectious Diseases

## 2022-08-31 ENCOUNTER — Ambulatory Visit: Payer: Medicare Other | Attending: Infectious Diseases | Admitting: Infectious Diseases

## 2022-08-31 VITALS — BP 110/70 | HR 87 | Temp 96.9°F

## 2022-08-31 DIAGNOSIS — Z801 Family history of malignant neoplasm of trachea, bronchus and lung: Secondary | ICD-10-CM | POA: Diagnosis not present

## 2022-08-31 DIAGNOSIS — K5900 Constipation, unspecified: Secondary | ICD-10-CM | POA: Insufficient documentation

## 2022-08-31 DIAGNOSIS — A318 Other mycobacterial infections: Secondary | ICD-10-CM

## 2022-08-31 DIAGNOSIS — C7931 Secondary malignant neoplasm of brain: Secondary | ICD-10-CM | POA: Diagnosis not present

## 2022-08-31 DIAGNOSIS — I4891 Unspecified atrial fibrillation: Secondary | ICD-10-CM | POA: Insufficient documentation

## 2022-08-31 DIAGNOSIS — C3490 Malignant neoplasm of unspecified part of unspecified bronchus or lung: Secondary | ICD-10-CM | POA: Diagnosis not present

## 2022-08-31 DIAGNOSIS — R630 Anorexia: Secondary | ICD-10-CM | POA: Diagnosis not present

## 2022-08-31 DIAGNOSIS — Z85118 Personal history of other malignant neoplasm of bronchus and lung: Secondary | ICD-10-CM | POA: Diagnosis not present

## 2022-08-31 NOTE — Patient Instructions (Signed)
You are here forf ollow up of mycobacterium chelonae infection of the skin and soft tissue- you are on antibiotics clarithromycin and doxy and you are having poor appetite weakness- you are thinking of stopping antibiotics after talking to Dr.B. follow prn

## 2022-08-31 NOTE — Progress Notes (Signed)
F/u visit Here with family  88 yr male with history of Afib, metastatic  lung cancer  s/p chemo, radiation, PDLI inhibitor and radiation to brain for mets.  On pembrulizumab Has mycobacterium chelonae skin and soft tissue infecitons of the extremities I first saw him in August and he was soon started on double  antibiotic therapy with clarithromycin/Doxy Then Levaquin was added but he could not tolerate levaquin because of poor appetite and he stopped it a month ago His appetite has not improved and he wants to stop all antibiotics and see. He has weakness and fell once HE got an infusion of PDL1 inhibiotir 2 weeks ago He has had a few new lesions appearing on his rt leg    Past Medical History:  Diagnosis Date   Arthritis    Dysrhythmia    A-fib   Hip pain    left   HOH (hard of hearing)    LBBB (left bundle branch block) 08/05/2020   Lung cancer ([place])    Skin cancer, basal cell     face top of head    Past Surgical History:  Procedure Laterality Date   CATARACT EXTRACTION W/PHACO Right 07/13/2017   Procedure: CATARACT EXTRACTION PHACO AND INTRAOCULAR LENS PLACEMENT ([place]);  Surgeon: Leandrew Koyanagi, MD;  Location: [facility] [facility];  Service: Ophthalmology;  Laterality: Right;  IVA TOPICAL RIGHT   CATARACT EXTRACTION W/PHACO Left 01/04/2018   Procedure: CATARACT EXTRACTION PHACO AND INTRAOCULAR LENS PLACEMENT ([place]) LEFT;  Surgeon: Leandrew Koyanagi, MD;  Location: [facility];  Service: Ophthalmology;  Laterality: Left;   COLONOSCOPY     FEMUR FRACTURE SURGERY Right 01/03/2022   INTRAMEDULLARY (IM) NAIL INTERTROCHANTERIC Right 01/03/2022   Procedure: INTRAMEDULLARY (IM) NAIL INTERTROCHANTRIC;  Surgeon: Corky Mull, MD;  Location: [facility];  Service: Orthopedics;  Laterality: Right;   IR IMAGING GUIDED PORT INSERTION  08/27/2020   JOINT REPLACEMENT     Left total hip Dr. Su Hoff 08-04-18   MOHS SURGERY  06/01/2022   ROTATOR CUFF REPAIR Right    SKIN  CANCER EXCISION     face   TOTAL HIP ARTHROPLASTY Left 08/04/2018   Procedure: TOTAL HIP ARTHROPLASTY ANTERIOR APPROACH;  Surgeon: Frederik Pear, MD;  [facility];  Service: Orthopedics;  Laterality: Left;   VIDEO BRONCHOSCOPY WITH ENDOBRONCHIAL NAVIGATION N/A 08/07/2020   Procedure: VIDEO BRONCHOSCOPY WITH ENDOBRONCHIAL NAVIGATION;  Surgeon: Ottie Glazier, MD;  Location: [facility];  Service: Thoracic;  Laterality: N/A;   VIDEO BRONCHOSCOPY WITH ENDOBRONCHIAL ULTRASOUND N/A 08/07/2020   Procedure: VIDEO BRONCHOSCOPY WITH ENDOBRONCHIAL ULTRASOUND;  Surgeon: Ottie Glazier, MD;  Location: [facility];  Service: Thoracic;  Laterality: N/A;    Social History   Socioeconomic History   Marital status: Married    Spouse name: Not on file   Number of children: Not on file   Years of education: Not on file   Highest education level: Not on file  Occupational History   Not on file  Tobacco Use   Smoking status: Former    Packs/day: 1.00    Years: 4.00    Total pack years: 4.00    Types: Cigarettes   Smokeless tobacco: Never   Tobacco comments:    quit early 70's  Vaping Use   Vaping Use: Never used  Substance and Sexual Activity   Alcohol use: No   Drug use: No   Sexual activity: Not Currently  Other Topics Concern   Not on file  Social History Narrative   >  quit 35 years; smoked for 15 years. Rare alcohol. 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    Social Determinants of Health   Financial Resource Strain: Not on file  Food Insecurity: Not on file  Transportation Needs: Not on file  Physical Activity: Not on file  Stress: Not on file  Social Connections: Not on file  Intimate Partner Violence: Not on file    Family History  Problem Relation Age of Onset   Throat cancer Brother         & lung cancer   No Known Allergies I? Current Outpatient Medications  Medication Sig Dispense Refill   acetaminophen  (TYLENOL) 500 MG tablet Take 1 tablet (500 mg total) by mouth every 6 (six) hours as needed. 30 tablet 0   apixaban (ELIQUIS) 5 MG TABS tablet Take 5 mg by mouth 2 (two) times daily.     clarithromycin (BIAXIN) 500 MG tablet Take 1 tablet (500 mg total) by mouth 2 (two) times daily. 60 tablet 2   doxycycline (ADOXA) 100 MG tablet Take 1 tablet (100 mg total) by mouth 2 (two) times daily. 60 tablet 2   dronabinol (MARINOL) 2.5 MG capsule Take 1 capsule (2.5 mg total) by mouth 2 (two) times daily before a meal. 60 capsule 0   furosemide (LASIX) 20 MG tablet Take 1 tablet (20 mg total) by mouth daily. 30 tablet 3   gentamicin ointment (GARAMYCIN) 0.1 % Apply 1 Application topically 3 (three) times daily. 15 g 0   hydroxypropyl methylcellulose / hypromellose (ISOPTO TEARS / GONIOVISC) 2.5 % ophthalmic solution Place 1 drop into the left eye daily as needed for dry eyes.     lidocaine-prilocaine (EMLA) cream Apply 1 Application topically as needed. Apply small amount to port site at least 1 hour prior to it being accessed, cover with plastic wrap 30 g 1   Multiple Vitamin (MULTIVITAMIN) tablet Take 1 tablet by mouth daily.     ondansetron (ZOFRAN) 8 MG tablet One pill every 8 hours as needed for nausea/vomitting. 40 tablet 1   pravastatin (PRAVACHOL) 80 MG tablet Take 80 mg by mouth at bedtime.      prochlorperazine (COMPAZINE) 10 MG tablet Take 0.5-1 tablets (5-10 mg total) by mouth every 6 (six) hours as needed for nausea or vomiting. 30 tablet 0   saw palmetto 160 MG capsule Take 160 mg by mouth 2 (two) times daily.     traZODone (DESYREL) 50 MG tablet Take 1 tablet (50 mg total) by mouth at bedtime as needed for sleep. 30 tablet 3   No current facility-administered medications for this visit.   Facility-Administered Medications Ordered in Other Visits  Medication Dose Route Frequency Provider Last Rate Last Admin   heparin lock flush 100 UNIT/ML injection            heparin lock flush 100 unit/mL   500 Units Intravenous Once Charlaine Dalton R, MD       sodium chloride flush (NS) 0.9 % injection 10 mL  10 mL Intravenous PRN Charlaine Dalton R, MD   10 mL at 02/11/21 0835   sodium chloride flush (NS) 0.9 % injection 10 mL  10 mL Intravenous Once Borders, Kirt Boys, NP         Abtx:  Anti-infectives (From admission, onward)    None       REVIEW OF SYSTEMS:  Const: negative fever, negative chills, + +weight loss  GI: poor appetite, severe constipation needing enema No energy fatigue  anxiety,  No cough or sob No head ache New skin nodules  O/e Pale, weak, alert, in wheel chair Enaciated Chest b/l air entry [id number] Abd soft Edema legs Multiple purplish nodules scattered over extremities    Impression/Recommendation   Stage IV metastaic non small cell lung cancer - getting immune check point inhibitor treatment  Atypical mycobacteria skin infection in an immune compromised individual due to mycobacterium chelonae Pt currently on DOXY and clarithromycin,  Poor appetite /low energy- and severe constipation The antibiotics are not curing his infecitionn but rather keeping it under control- this is because he continues to get immune check point inhibitor HE wants to stop antibiotics- okay to hold and see  He will discuss with oncologist  Metastatic lung cancer Weight loss Weakness   Discussed with patient and his family in great detail He will let me know what he decides regarding antibiotic Follow PRN ?

## 2022-09-01 ENCOUNTER — Inpatient Hospital Stay: Payer: Medicare Other

## 2022-09-01 ENCOUNTER — Inpatient Hospital Stay (HOSPITAL_BASED_OUTPATIENT_CLINIC_OR_DEPARTMENT_OTHER): Payer: Medicare Other | Admitting: Internal Medicine

## 2022-09-01 ENCOUNTER — Telehealth: Payer: Self-pay | Admitting: *Deleted

## 2022-09-01 ENCOUNTER — Other Ambulatory Visit: Payer: Self-pay | Admitting: Internal Medicine

## 2022-09-01 VITALS — BP 97/65 | HR 77 | Temp 97.4°F | Resp 18 | Wt 148.6 lb

## 2022-09-01 DIAGNOSIS — R1319 Other dysphagia: Secondary | ICD-10-CM | POA: Diagnosis not present

## 2022-09-01 DIAGNOSIS — C3492 Malignant neoplasm of unspecified part of left bronchus or lung: Secondary | ICD-10-CM | POA: Diagnosis not present

## 2022-09-01 DIAGNOSIS — Z95828 Presence of other vascular implants and grafts: Secondary | ICD-10-CM

## 2022-09-01 DIAGNOSIS — C7931 Secondary malignant neoplasm of brain: Secondary | ICD-10-CM

## 2022-09-01 DIAGNOSIS — E860 Dehydration: Secondary | ICD-10-CM

## 2022-09-01 DIAGNOSIS — C3490 Malignant neoplasm of unspecified part of unspecified bronchus or lung: Secondary | ICD-10-CM

## 2022-09-01 DIAGNOSIS — Z5112 Encounter for antineoplastic immunotherapy: Secondary | ICD-10-CM | POA: Diagnosis not present

## 2022-09-01 LAB — COMPREHENSIVE METABOLIC PANEL
ALT: 15 U/L (ref 0–44)
AST: 26 U/L (ref 15–41)
Albumin: 3.3 g/dL — ABNORMAL LOW (ref 3.5–5.0)
Alkaline Phosphatase: 162 U/L — ABNORMAL HIGH (ref 38–126)
Anion gap: 10 (ref 5–15)
BUN: 20 mg/dL (ref 8–23)
CO2: 24 mmol/L (ref 22–32)
Calcium: 8.8 mg/dL — ABNORMAL LOW (ref 8.9–10.3)
Chloride: 99 mmol/L (ref 98–111)
Creatinine, Ser: 0.75 mg/dL (ref 0.61–1.24)
GFR, Estimated: >60 mL/min (ref >60)
Glucose, Bld: 155 mg/dL — ABNORMAL HIGH (ref 70–99)
Potassium: 4 mmol/L (ref 3.5–5.1)
Sodium: 133 mmol/L — ABNORMAL LOW (ref 135–145)
Total Bilirubin: 0.8 mg/dL (ref 0.3–1.2)
Total Protein: 6.9 g/dL (ref 6.5–8.1)

## 2022-09-01 LAB — CBC WITH DIFFERENTIAL/PLATELET
Abs Immature Granulocytes: 0.08 10*3/uL — ABNORMAL HIGH (ref 0.00–0.07)
Basophils Absolute: 0 10*3/uL (ref 0.0–0.1)
Basophils Relative: 0 %
Eosinophils Absolute: 0.1 10*3/uL (ref 0.0–0.5)
Eosinophils Relative: 1 %
HCT: 39.5 % (ref 39.0–52.0)
Hemoglobin: 13 g/dL (ref 13.0–17.0)
Immature Granulocytes: 1 %
Lymphocytes Relative: 9 %
Lymphs Abs: 0.9 10*3/uL (ref 0.7–4.0)
MCH: 30.9 pg (ref 26.0–34.0)
MCHC: 32.9 g/dL (ref 30.0–36.0)
MCV: 93.8 fL (ref 80.0–100.0)
Monocytes Absolute: 0.7 10*3/uL (ref 0.1–1.0)
Monocytes Relative: 7 %
Neutro Abs: 8.5 10*3/uL — ABNORMAL HIGH (ref 1.7–7.7)
Neutrophils Relative %: 82 %
Platelets: 386 10*3/uL (ref 150–400)
RBC: 4.21 MIL/uL — ABNORMAL LOW (ref 4.22–5.81)
RDW: 14.5 % (ref 11.5–15.5)
WBC: 10.2 10*3/uL (ref 4.0–10.5)
nRBC: 0 % (ref 0.0–0.2)

## 2022-09-01 MED ORDER — SODIUM CHLORIDE 0.9% FLUSH
10.0000 mL | Freq: Once | INTRAVENOUS | Status: AC
  Administered 2022-09-01: 10 mL via INTRAVENOUS
  Filled 2022-09-01: qty 10

## 2022-09-01 MED ORDER — LEVOFLOXACIN 500 MG PO TABS: 500.0000 mg | ORAL_TABLET | Freq: Every day | ORAL | 0 refills | Status: DC

## 2022-09-01 MED ORDER — HEPARIN SOD (PORK) LOCK FLUSH 100 UNIT/ML IV SOLN
500.0000 [IU] | Freq: Once | INTRAVENOUS | Status: AC
  Administered 2022-09-01: 500 [IU]
  Filled 2022-09-01: qty 5

## 2022-09-01 MED ORDER — CEPHALEXIN 500 MG PO CAPS: 500.0000 mg | ORAL_CAPSULE | Freq: Three times a day (TID) | ORAL | 0 refills | Status: DC

## 2022-09-01 NOTE — Progress Notes (Signed)
No iv fluids needed today. Port d/c

## 2022-09-01 NOTE — Progress Notes (Signed)
I spoke with Dr. Steva Ready.  Recommend discontinuation of clarithromycin/doxycycline.  Recommend not starting Levaquin as previously ordered.  Recommend starting Keflex 500 mg 3 times daily for 7 days.   Heather/Kendra-please inform patient of above.  GB

## 2022-09-01 NOTE — Assessment & Plan Note (Addendum)
#  Stage IV- -non-small cell lung cancer-[favor adenocarcinoma] [at dx-stage III- s/p  concurrent chemoradiation; s/p  adjuvant durvalumab; finished Feb 2023]; with metachronous brain metastases [March 2023];   AUG-2023-CT/PET scan-  Tracheal esophageal lymph node displays moderate increased metabolic activity in signs of central necrosis compatible with metastatic lymph node. Signs of bony metastasis to the LEFT olecranon.  Patient currently on single agent Keytruda [ PD-L1-90 percent].  # currently s/p keytruda cycle #3 appx 10 days ago.  sec to cellulitis- Left LE- see below.    Labs today reviewed; We will plan re-imaging given ongoing issues.  PET Scan ordered.  #Left olecranon metastases-continue monitoring for pain; if worse would recommend radiation.   # Left foot swelling/ankle pain- cellutis- recommend adding levaquin x2 weeks; new script sent.  Discussed that if not improved would recommend IV antibiotics.  Patient to call with an update in 1 week.  Continue Tylenol prn.  Discussed with Dr.Ravishankar.   # Brain metastases--s/p SBRT [March, 23rrd-2023] AUG 2023- Continued interval decrease in size of a peripherally enhancing right frontal lobe metastasis, now measuring 9 x 8 mm. Surrounding edema persists, but has also decreased.  Will order MRI Brain.    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  Discussed with Dr.Ravishankar.   # Dysphagia/ Poor appetite- discussed possible adrenal insuff; [leg swelling on steroids]-continue marinol for now [x7 days]- if not improved then add zyprexa.   # Constipation: Status post laxatives stool softener in the past.  Miralax- recommend BID- TID prn; if not improved more frequently.  # A. fib on Eliquis [Dr.Kowalski]-  Mild swelling in legs- EF in Feb 2022- 55-60%- STABLE.  Discussed regarding importance of avoiding falls while on  anticoagulation.  # Right hip fracture status post ORIF.  Nonpathologic. On  physical therapy- STABLE.  # MediPort- functional stable.  # Vaccination- flu shots today; covid- OK.   # DISPOSITION: # No IVFs today # MRI Brain ASAP # Barium esophagogram ASAP # follow up on dec 11 MD; labs- cbc/cmp; TSH port- KEYTRUDA; PET prior- Dr.B

## 2022-09-01 NOTE — Progress Notes (Signed)
[facility] NOTE  Patient Care Team: Baxter Hire, MD as PCP - General (Internal Medicine) [name], RN as Oncology Nurse Navigator [name], MD as Consulting Physician (Hematology and Oncology) [name], RN as Registered Nurse (Oncology)  CHIEF COMPLAINTS/PURPOSE OF CONSULTATION: Lung cancer  #  Oncology History Overview Note  # NOV 2021- LEFT LOWER LOBE NON-SMALL CELL CA [favor adeno ca]; T2N3 [right hilar; subcarinal LN;[name]; NOV 2021-MRI [facility]  # 11/30- carbo-Taxol-RT [RT until 10/23/20]; s/p IMFINZI on 2/24. [63 months]; finished February 2023.  # #Brain metastases-March 2023-MRI-brain 2.1 cm peripherally enhancing lesion in the right paracentral lobule concerning for metastatic disease. There is mild perilesional edema and regional mass effect but no midline shift. On dexamethasone 2 mg a day.  Noted to have significant movement of the left lower extremity weakness.  Plan with SBRT on 3/22.  # SEP 20th, 2023- Keytruda [PDL-1:90%]  # Right lower lobe - ~4.6 x 3.1 cm  [PET 07-2020]- demonstrates no significant hypermetabolic activity (SUV max 1.8]-benign. STABLE.   # OCT 1SH-7026Beryle Flock- disease in the tracheoesophageal groove-proceed with Keytruda  [PD-L1-90 percent]  # A.fib [Eliquis; Dr.Kowalski]  # # SURVIVORSHIP:   # GENETICS:   #NGS-ordered  DIAGNOSIS: Lung cancer  STAGE:   III      ;  GOALS:  cure  CURRENT/MOST RECENT THERAPY : carbo-Taxol-RT ]   Stage 4 lung cancer, left ([place])  08/14/2020 Initial Diagnosis   Cancer of lower lobe of left lung ([place])   09/02/2020 - 10/22/2020 Chemotherapy    Patient is on Treatment Plan: LUNG DURVALUMAB Q14D      10/16/2020 Cancer Staging   Staging form: Lung, AJCC 8th Edition - Clinical: Stage IIIB (cT2a, cN3, cM0) - Signed by [name], MD on 10/16/2020   12/17/2020 - 11/27/2021 Chemotherapy   Patient is on Treatment Plan : LUNG Durvalumab  q14d     07/09/2022 -  Chemotherapy   Patient is on Treatment Plan : LUNG NSCLC Pembrolizumab (200) q21d      HISTORY OF PRESENTING ILLNESS: Patient is ambulating with the wheel chair.  He is accompanied by his daughter.   Jason Warren 88 y.o.  male patient with currently stage IV [metachronus stage III lung non-small cell lung; s/p treatment feb 2023 with Brain mets status post [facility] MARCH 2023 is here for a follow up/proceed with immunotherapy.  Patient also on antibiotics- on Clarithromycin; and doxy for his atypical mycobacterial skin infection.  Complains of poor appetite.  Positive for weight loss.  Complains of difficulty swallowing especially solids.  Complains of cough post eating. Feels tired.   Complains of worsening left leg swelling; and pain with movement.   Complains of ongoing fatigue.  Also had a fall- hit head [on eliquis]; however declined emergency room evaluation.   Review of Systems  Constitutional:  Positive for malaise/fatigue. Negative for chills, diaphoresis, fever and weight loss.  HENT:  Negative for nosebleeds and sore throat.   Eyes:  Negative for double vision.  Respiratory:  Negative for hemoptysis, sputum production, shortness of breath and wheezing.   Cardiovascular:  Positive for leg swelling. Negative for chest pain, palpitations and orthopnea.  Gastrointestinal:  Negative for abdominal pain, blood in stool, constipation, diarrhea, heartburn, melena, nausea and vomiting.  Genitourinary:  Negative for dysuria and urgency.  Musculoskeletal:  Negative for back pain and joint pain.  Skin:  Negative for itching.  Neurological:  Positive for focal weakness. Negative for tingling, weakness  and headaches.  Endo/Heme/Allergies:  Does not bruise/bleed easily.  Psychiatric/Behavioral:  Negative for depression. The patient is not nervous/anxious and does not have insomnia.      MEDICAL HISTORY:  Past Medical History:  Diagnosis Date   Arthritis    Dysrhythmia     A-fib   Hip pain    left   HOH (hard of hearing)    LBBB (left bundle branch block) 08/05/2020   Lung cancer ([place])    Skin cancer, basal cell     face top of head    SURGICAL HISTORY: Past Surgical History:  Procedure Laterality Date   CATARACT EXTRACTION W/PHACO Right 07/13/2017   Procedure: CATARACT EXTRACTION PHACO AND INTRAOCULAR LENS PLACEMENT ([place]);  Surgeon: Leandrew Koyanagi, MD;  Location: [facility];  Service: Ophthalmology;  Laterality: Right;  IVA TOPICAL RIGHT   CATARACT EXTRACTION W/PHACO Left 01/04/2018   Procedure: CATARACT EXTRACTION PHACO AND INTRAOCULAR LENS PLACEMENT ([place]) LEFT;  Surgeon: Leandrew Koyanagi, MD;  Location: [facility];  Service: Ophthalmology;  Laterality: Left;   COLONOSCOPY     FEMUR FRACTURE SURGERY Right 01/03/2022   INTRAMEDULLARY (IM) NAIL INTERTROCHANTERIC Right 01/03/2022   Procedure: INTRAMEDULLARY (IM) NAIL INTERTROCHANTRIC;  Surgeon: Corky Mull, MD;  Location: [facility];  Service: Orthopedics;  Laterality: Right;   IR IMAGING GUIDED PORT INSERTION  08/27/2020   JOINT REPLACEMENT     Left total hip Dr. Su Hoff 08-04-18   MOHS SURGERY  06/01/2022   ROTATOR CUFF REPAIR Right    SKIN CANCER EXCISION     face   TOTAL HIP ARTHROPLASTY Left 08/04/2018   Procedure: TOTAL HIP ARTHROPLASTY ANTERIOR APPROACH;  Surgeon: Frederik Pear, MD;  Location: [facility];  Service: Orthopedics;  Laterality: Left;   VIDEO BRONCHOSCOPY WITH ENDOBRONCHIAL NAVIGATION N/A 08/07/2020   Procedure: VIDEO BRONCHOSCOPY WITH ENDOBRONCHIAL NAVIGATION;  Surgeon: Ottie Glazier, MD;  Location: [facility];  Service: Thoracic;  Laterality: N/A;   VIDEO BRONCHOSCOPY WITH ENDOBRONCHIAL ULTRASOUND N/A 08/07/2020   Procedure: VIDEO BRONCHOSCOPY WITH ENDOBRONCHIAL ULTRASOUND;  Surgeon: Ottie Glazier, MD;  Location: [facility];  Service: Thoracic;  Laterality: N/A;    SOCIAL HISTORY: Social History   Socioeconomic History   Marital status: Married     Spouse name: Not on file   Number of children: Not on file   Years of education: Not on file   Highest education level: Not on file  Occupational History   Not on file  Tobacco Use   Smoking status: Former    Packs/day: 1.00    Years: 4.00    Total pack years: 4.00    Types: Cigarettes   Smokeless tobacco: Never   Tobacco comments:    quit early 70's  Vaping Use   Vaping Use: Never used  Substance and Sexual Activity   Alcohol use: No   Drug use: No   Sexual activity: Not Currently  Other Topics Concern   Not on file  Social History Narrative   > quit 35 years; smoked for 15 years. Rare alcohol. 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    Social Determinants of Health   Financial Resource Strain: Not on file  Food Insecurity: Not on file  Transportation Needs: Not on file  Physical Activity: Not on file  Stress: Not on file  Social Connections: Not on file  Intimate Partner Violence: Not on file    FAMILY HISTORY: Family History  Problem Relation  Age of Onset   Throat cancer Brother         & lung cancer    ALLERGIES:  has No Known Allergies.  MEDICATIONS:  Current Outpatient Medications  Medication Sig Dispense Refill   acetaminophen (TYLENOL) 500 MG tablet Take 1 tablet (500 mg total) by mouth every 6 (six) hours as needed. 30 tablet 0   apixaban (ELIQUIS) 5 MG TABS tablet Take 5 mg by mouth 2 (two) times daily.     clarithromycin (BIAXIN) 500 MG tablet Take 1 tablet (500 mg total) by mouth 2 (two) times daily. 60 tablet 2   doxycycline (ADOXA) 100 MG tablet Take 1 tablet (100 mg total) by mouth 2 (two) times daily. 60 tablet 2   dronabinol (MARINOL) 2.5 MG capsule Take 1 capsule (2.5 mg total) by mouth 2 (two) times daily before a meal. 60 capsule 0   furosemide (LASIX) 20 MG tablet Take 1 tablet (20 mg total) by mouth daily. 30 tablet 3   gentamicin ointment (GARAMYCIN) 0.1 % Apply 1  Application topically 3 (three) times daily. 15 g 0   hydroxypropyl methylcellulose / hypromellose (ISOPTO TEARS / GONIOVISC) 2.5 % ophthalmic solution Place 1 drop into the left eye daily as needed for dry eyes.     levofloxacin (LEVAQUIN) 500 MG tablet Take 1 tablet (500 mg total) by mouth daily. 14 tablet 0   lidocaine-prilocaine (EMLA) cream Apply 1 Application topically as needed. Apply small amount to port site at least 1 hour prior to it being accessed, cover with plastic wrap 30 g 1   Multiple Vitamin (MULTIVITAMIN) tablet Take 1 tablet by mouth daily.     ondansetron (ZOFRAN) 8 MG tablet One pill every 8 hours as needed for nausea/vomitting. 40 tablet 1   pravastatin (PRAVACHOL) 80 MG tablet Take 80 mg by mouth at bedtime.      prochlorperazine (COMPAZINE) 10 MG tablet Take 0.5-1 tablets (5-10 mg total) by mouth every 6 (six) hours as needed for nausea or vomiting. 30 tablet 0   saw palmetto 160 MG capsule Take 160 mg by mouth 2 (two) times daily.     traZODone (DESYREL) 50 MG tablet Take 1 tablet (50 mg total) by mouth at bedtime as needed for sleep. 30 tablet 3   No current facility-administered medications for this visit.   Facility-Administered Medications Ordered in Other Visits  Medication Dose Route Frequency Provider Last Rate Last Admin   heparin lock flush 100 UNIT/ML injection            heparin lock flush 100 unit/mL  500 Units Intravenous Once Charlaine Dalton R, MD       sodium chloride flush (NS) 0.9 % injection 10 mL  10 mL Intravenous PRN Charlaine Dalton R, MD   10 mL at 02/11/21 0835   sodium chloride flush (NS) 0.9 % injection 10 mL  10 mL Intravenous Once Borders, Kirt Boys, NP       PHYSICAL EXAMINATION: ECOG PERFORMANCE STATUS: 1 - Symptomatic but completely ambulatory  Vitals:   09/01/22 0900  BP: 97/65  Pulse: 77  Resp: 18  Temp: (!) 97.4 F (36.3 C)  SpO2: 98%   Filed Weights   09/01/22 0900  Weight: 148 lb 9.6 oz (67.4 kg)   Bilateral upper  lower extremity boils noted.  Physical Exam HENT:     Head: Normocephalic and atraumatic.     Mouth/Throat:     Pharynx: No oropharyngeal exudate.  Eyes:     Pupils:  Pupils are equal, round, and reactive to light.  Cardiovascular:     Rate and Rhythm: Normal rate and regular rhythm.  Pulmonary:     Effort: Pulmonary effort is normal. No respiratory distress.     Breath sounds: No wheezing.     Comments: Slightly decreased breath sounds left lower lung base. Abdominal:     General: Bowel sounds are normal. There is no distension.     Palpations: Abdomen is soft. There is no mass.     Tenderness: There is no abdominal tenderness. There is no guarding or rebound.  Musculoskeletal:        General: No tenderness. Normal range of motion.     Cervical back: Normal range of motion and neck supple.  Skin:    General: Skin is warm.  Neurological:     Mental Status: He is alert and oriented to person, place, and time.  Psychiatric:        Mood and Affect: Affect normal.      LABORATORY DATA:  I have reviewed the data as listed Lab Results  Component Value Date   WBC 10.2 09/01/2022   HGB 13.0 09/01/2022   HCT 39.5 09/01/2022   MCV 93.8 09/01/2022   PLT 386 09/01/2022   Recent Labs    08/19/22 1006 08/23/22 0856 09/01/22 0911  NA 132* 134* 133*  K 3.9 4.2 4.0  CL 97* 100 99  CO2 _0 GLUCOSE 121* 140* 155*  BUN _1 CREATININE 0.72 0.83 0.75  CALCIUM 8.9 9.0 8.8*  GFRNONAA >60 >60 >60  PROT 7.0 7.0 6.9  ALBUMIN 3.5 3.5 3.3*  AST _2 ALT _3 ALKPHOS 168* 167* 162*  BILITOT 0.8 0.7 0.8    RADIOGRAPHIC STUDIES: I have personally reviewed the radiological images as listed and agreed with the findings in the report. No results found.   ASSESSMENT & PLAN:   Stage 4 lung cancer, left ([place]) #Stage IV- -non-small cell lung cancer-[favor adenocarcinoma] [at dx-stage III- s/p  concurrent chemoradiation; s/p  adjuvant durvalumab; finished Feb  2023]; with metachronous brain metastases [March 2023];   AUG-2023-CT/PET scan-  Tracheal esophageal lymph node displays moderate increased metabolic activity in signs of central necrosis compatible with metastatic lymph node. Signs of bony metastasis to the LEFT olecranon.  Patient currently on single agent Keytruda [ PD-L1-90 percent].  # currently s/p keytruda cycle #3 appx 10 days ago.  sec to cellulitis- Left LE- see below.    Labs today reviewed;We will plan re-imaging given ongoing issues.   Scan ordered.  #Left olecranon metastases-continue monitoring for pain; if worse would recommend radiation.   # Left foot swelling/ankle pain- cellutis- recommend adding levaquin; new script sent. Continue Tylenol prn.   # Brain metastases--s/p SBRT [March, 23rrd-2023] AUG 2023- Continued interval decrease in size of a peripherally enhancing right frontal lobe metastasis, now measuring 9 x 8 mm. Surrounding edema persists, but has also decreased.  Will order MRI Brain.    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 Currently OFF levaquin [stopped 10/26]-overall improved; but some concerns for new lesion- discussed with dr.R.   # Dysphagia/ Poor appetite- discussed possible adrenal insuff; [leg swelling on steroids]-continue marinol for now [x7 days]- if not iproved then add zyprexa.   # Constipation: Status post laxatives stool softener in the past.  Miralax- recommend BID- TID prn; if not improved more frequently.  # A.  fib on Eliquis [Dr.Kowalski]-  Mild swelling in legs- EF in Feb 2022- 55-60%- STABLE.   # Right hip fracture status post ORIF.  Nonpathologic. On  physical therapy- STABLE.  # MediPort- functional stable.  # Vaccination- flu shots today; covid- OK.   # DISPOSITION: # No IVFs today # MRI Brain ASAP # Barium esophagogram ASAP # follow up on dec 11 MD; labs- cbc/cmp;  TSH port- KEYTRUDA; PET prior- Dr.B            All questions were answered. The patient knows to call the clinic with any problems, questions or concerns.   [name], MD 09/01/2022 12:19 PM

## 2022-09-01 NOTE — Progress Notes (Signed)
No appetite with no improvement since starting Marinol 5 days ago (2lb wt loss).  Cause a "deep" cough after eating and does not take antiemetics due to difficulty swallowing.  Bilateral lower extremity swelling and weakness L>R with left foot/heel pain, 6/10 pain scale.  Fell 2 days ago with no injury.   BP 97/65, HR 77

## 2022-09-01 NOTE — Telephone Encounter (Signed)
Call placed to daughter [name] to inform of the following as communicated by Dr. Jacinto Reap.   I spoke with Dr. Steva Ready. Recommend discontinuation of clarithromycin/doxycycline. Recommend not starting Levaquin as previously ordered.   Recommend starting Keflex 500 mg 3 times daily for 7 days. Prescription sent.   Patients daughter aware of recommendations and verbalized understanding of plan. Prescription for Levaquin had not been picked up yet so advised not to pick up. Medication list updated to reflect above changes. Call placed to pharmacy with update.

## 2022-09-02 ENCOUNTER — Ambulatory Visit
Admission: RE | Admit: 2022-09-02 | Discharge: 2022-09-02 | Disposition: A | Discharge status: Home / Self Care | Payer: Medicare Other | Source: Ambulatory Visit | Attending: Internal Medicine | Admitting: Internal Medicine

## 2022-09-02 DIAGNOSIS — R1319 Other dysphagia: Secondary | ICD-10-CM | POA: Insufficient documentation

## 2022-09-02 DIAGNOSIS — C3492 Malignant neoplasm of unspecified part of left bronchus or lung: Secondary | ICD-10-CM | POA: Diagnosis not present

## 2022-09-03 ENCOUNTER — Other Ambulatory Visit: Payer: Self-pay | Admitting: *Deleted

## 2022-09-03 ENCOUNTER — Telehealth: Payer: Self-pay | Admitting: Internal Medicine

## 2022-09-03 ENCOUNTER — Encounter: Payer: Self-pay | Admitting: Internal Medicine

## 2022-09-03 DIAGNOSIS — R1319 Other dysphagia: Secondary | ICD-10-CM

## 2022-09-03 DIAGNOSIS — T17208D Unspecified foreign body in pharynx causing other injury, subsequent encounter: Secondary | ICD-10-CM

## 2022-09-03 NOTE — Telephone Encounter (Signed)
Spoke to patient's daughter Jan regarding barium study await evaluation with speech pathology/modified barium swallow.  For now recommend swallowing the pills with applesauce.  Try to limit liquids.

## 2022-09-05 IMAGING — MR MR HEAD WO/W CM
15 series · 48 of 48 positions shown · IV contrast (gadavist)
Comparison: 12/16/2021

CLINICAL DATA: Brain/CNS neoplasm, assess treatment response

EXAM:
MRI HEAD WITHOUT AND WITH CONTRAST
TECHNIQUE: Multiplanar, multiecho pulse sequences of the brain and surrounding
structures were obtained without and with intravenous contrast.
CONTRAST:  7mL GADAVIST GADOBUTROL 1 MMOL/ML IV SOLN

[Series 5: ax dwi_tracew · axial · 3.0mm · 0.65mm/px · z∈[-93,+58]mm · 3 of 48 slices shown]
[im 1/48]
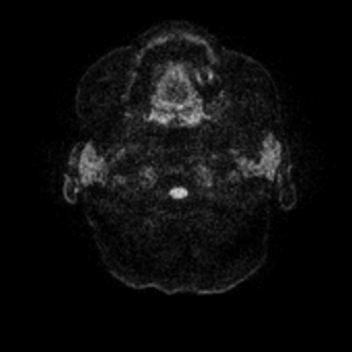
[im 24/48]
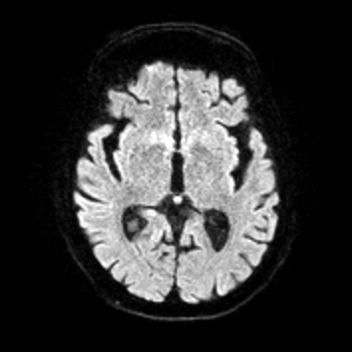
[im 48/48]
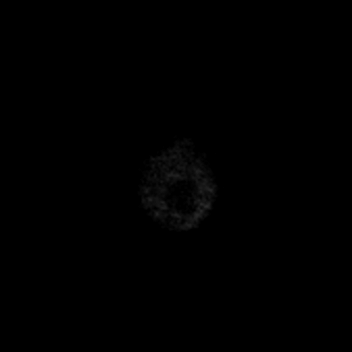

[Series 6: ax dwi_adc · axial · 3.0mm · 0.65mm/px · z∈[-93,+58]mm · 3 of 48 slices shown]
[im 1/48]
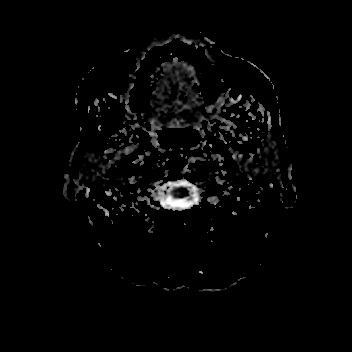
[im 24/48]
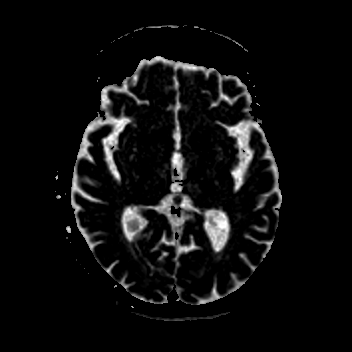
[im 48/48]
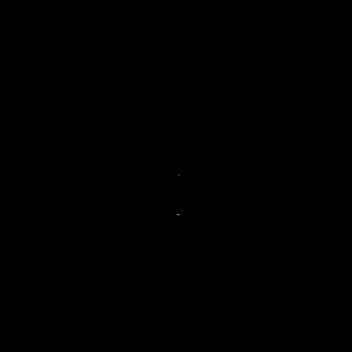

[Series 7: cor dwi_tracew · coronal · 5.0mm · 0.68mm/px · 2 of 40 slices shown]
[im 1/40]
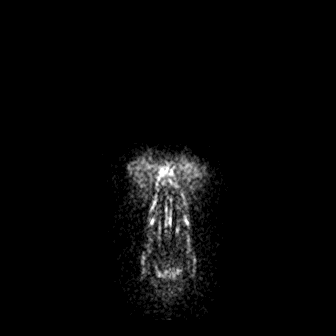
[im 40/40]
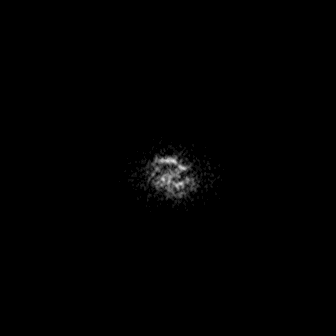

[Series 8: cor dwi_adc · coronal · 5.0mm · 0.68mm/px · 2 of 40 slices shown]
[im 1/40]
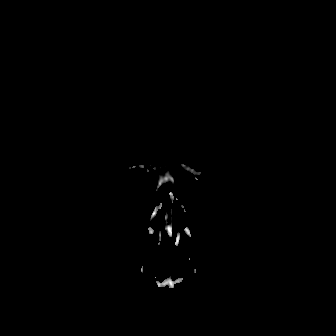
[im 40/40]
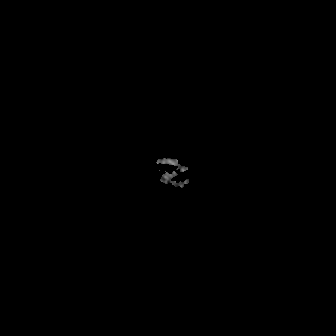

[Series 9: T1 · sagittal · 5.0mm · 0.62mm/px · 1 of 23 slices shown (1 of 2)]
[im 1/23]
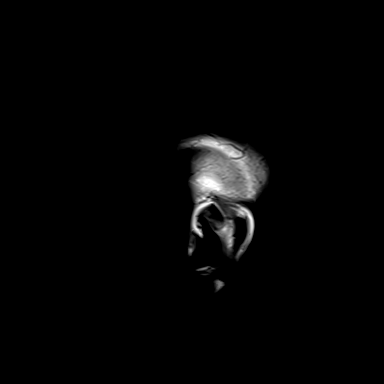

[Series 10: T2 · axial · 5.0mm · 0.53mm/px · 1 of 26 slices shown]
[im 1/26]
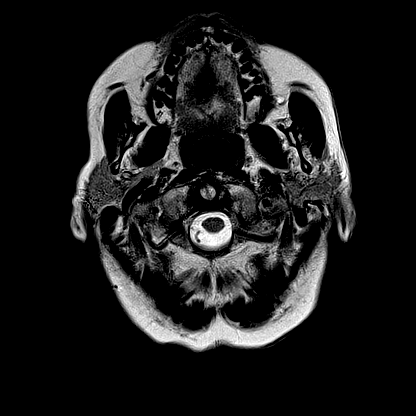

[Series 11: ax swi_mag · axial · 2.0mm · 0.90mm/px · z∈[-94,+59]mm · 4 of 80 slices shown]
[im 1/80]
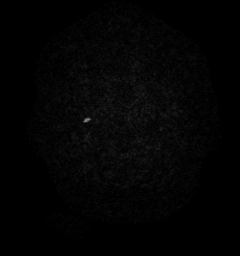
[im 27/80]
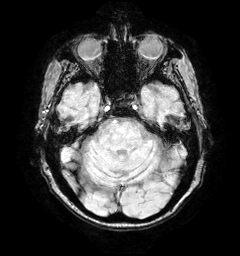
[im 53/80]
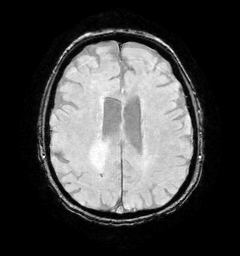
[im 80/80]
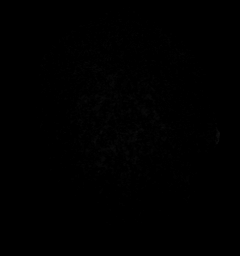

[Series 12: ax swi_pha · axial · 2.0mm · 0.90mm/px · z∈[-94,+59]mm · 4 of 80 slices shown]
[im 1/80]
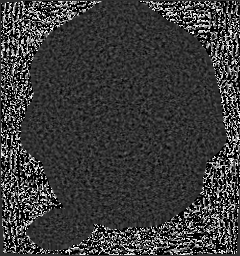
[im 27/80]
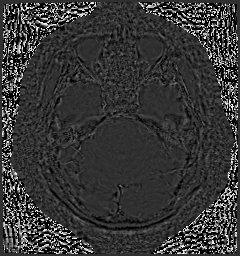
[im 53/80]
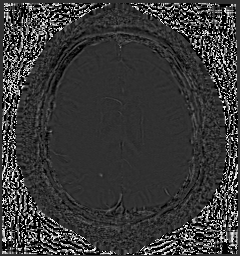
[im 80/80]
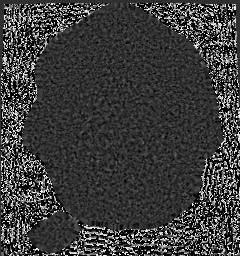

[Series 13: ax swi_swi · axial · 2.0mm · 0.90mm/px · z∈[-94,+59]mm · 4 of 80 slices shown]
[im 1/80]
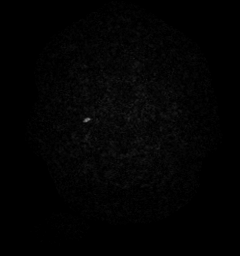
[im 27/80]
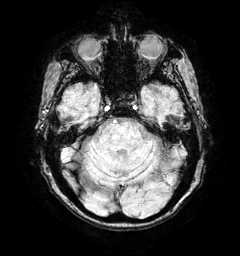
[im 53/80]
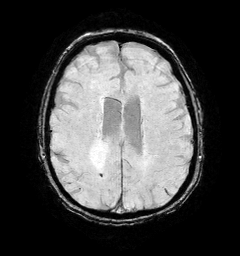
[im 80/80]
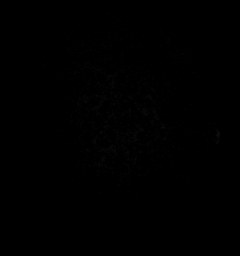

[Series 15: FLAIR · axial · 3.0mm · 0.53mm/px · z∈[-96,+62]mm · 3 of 55 slices shown]
[im 1/55]
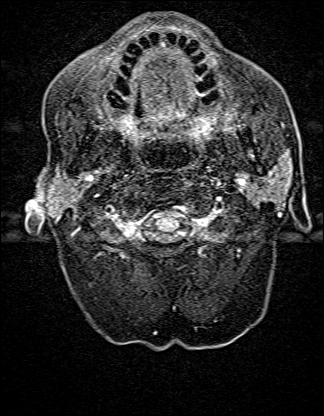
[im 28/55]
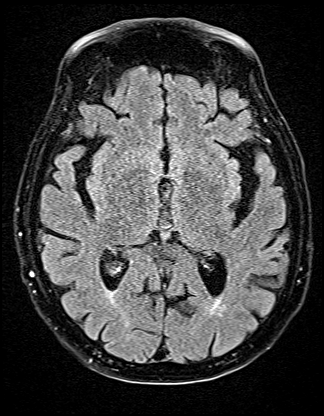
[im 55/55]
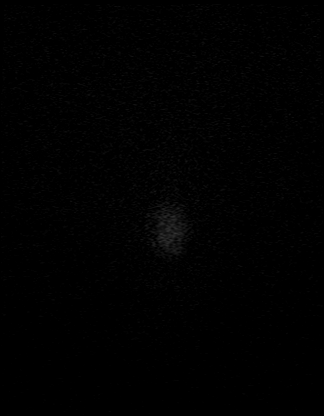

[Series 16: T1 · axial · 1.0mm · 0.98mm/px · z∈[-101,+67]mm · 9 of 172 slices shown (2 of 2)]
[im 1/172]
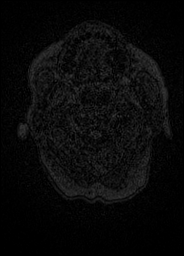
[im 22/172]
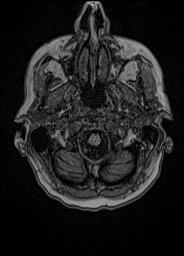
[im 43/172]
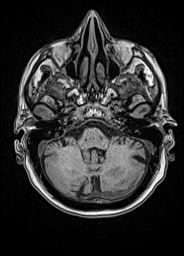
[im 65/172]
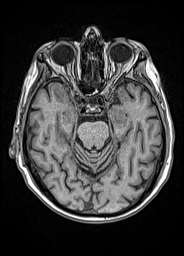
[im 86/172]
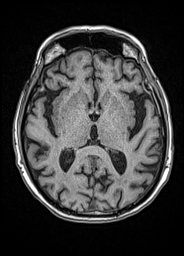
[im 107/172]
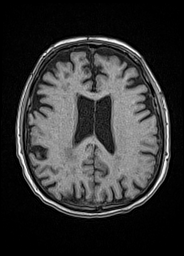
[im 129/172]
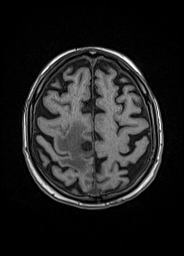
[im 150/172]
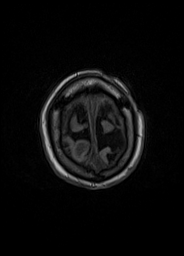
[im 172/172]
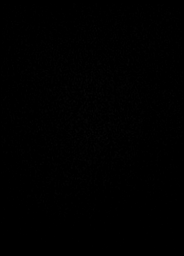

[Series 17: T2 post-contrast · coronal · 5.0mm · 0.57mm/px · 1 of 29 slices shown]
[im 1/29]
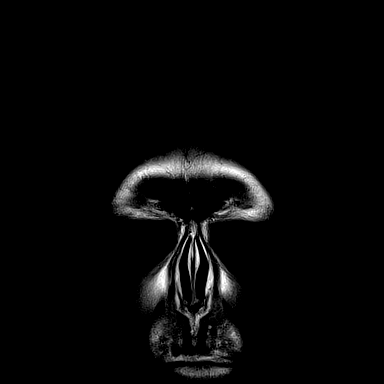

[Series 18: T1 post-contrast · axial · 1.0mm · 0.98mm/px · z∈[-101,+68]mm · 9 of 174 slices shown (1 of 3)]
[im 1/174]
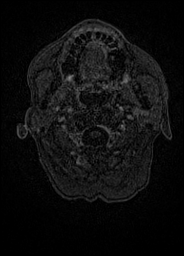
[im 22/174]
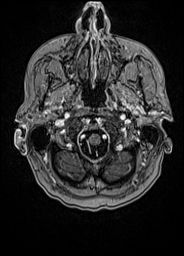
[im 44/174]
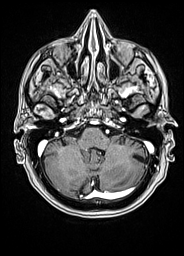
[im 65/174]
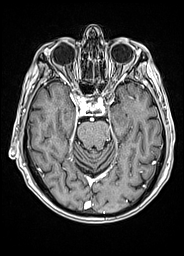
[im 87/174]
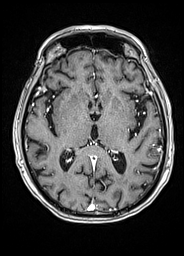
[im 109/174]
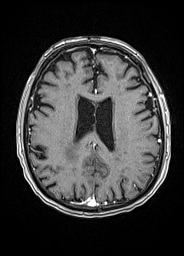
[im 130/174]
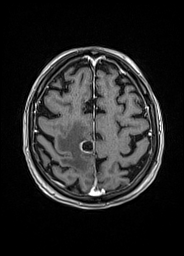
[im 152/174]
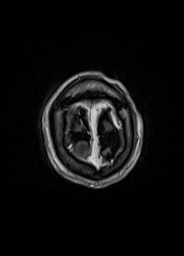
[im 174/174]
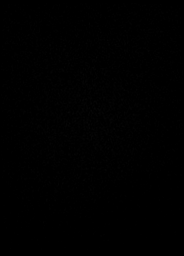

[Series 19: T1 post-contrast · coronal · 5.0mm · 0.57mm/px · 1 of 29 slices shown (2 of 3)]
[im 1/29]
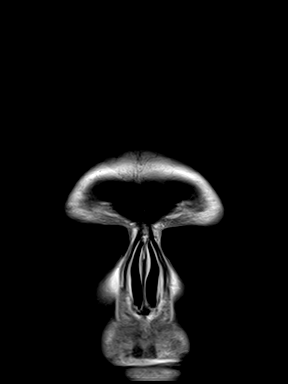

[Series 20: T1 post-contrast · sagittal · 5.0mm · 0.62mm/px · 1 of 23 slices shown (3 of 3)]
[im 1/23]
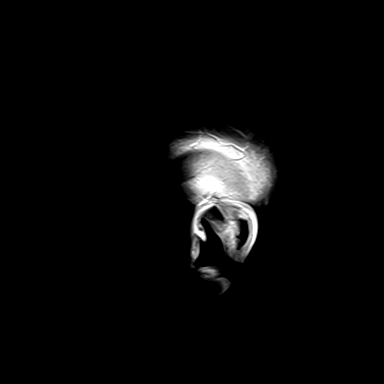

[48 of 48 positions shown; findings below may reference images not displayed]

FINDINGS: Brain: Peripherally enhancing lesion of the parasagittal right
frontal lobe presently measures up to 1.8 cm craniocaudal
(previously 2 cm). Surrounding edema has increased. There is no
significant mass effect.

No new mass or abnormal enhancement.

Additional patchy foci of T2 hyperintensity in the supratentorial
and pontine white matter nonspecific but may reflect stable chronic
microvascular ischemic changes. There is no acute infarction or
hemorrhage. Few small foci of susceptibility reflecting chronic
microhemorrhages are again noted. No hydrocephalus or extra-axial
collection.

Vascular: Major vessel flow voids at the skull base are preserved.

Skull and upper cervical spine: Normal marrow signal is preserved.

Sinuses/Orbits: Paranasal sinuses are aerated. Bilateral lens
replacements.

Other: Sella is unremarkable.  Mastoid air cells are clear.
IMPRESSION: Small decrease in size of treated right frontal metastasis.
Increased surrounding edema is likely related to treatment.

No new metastasis.

## 2022-09-06 ENCOUNTER — Telehealth: Payer: Self-pay | Admitting: *Deleted

## 2022-09-06 ENCOUNTER — Encounter: Payer: Self-pay | Admitting: Internal Medicine

## 2022-09-06 ENCOUNTER — Other Ambulatory Visit: Payer: Self-pay | Admitting: *Deleted

## 2022-09-06 NOTE — Telephone Encounter (Signed)
Jan called asking for number and to whom t was referred to for Speech therapy referral. I see that it was to [facility], but there is a note on the referral stating that they need to speak with doctor before evaluate  Referral Notes Number of Notes: 2 . Type Date User Summary Attachment  General 09/06/2022 12:59 PM Light, Melissa A Waiting on MaryBeth to check with Doctor. She said it looks like he will need a MBSS scheduled before speech therapy evaluation -  Note: Waiting on MaryBeth to check with Doctor. She said it looks like he will need a MBSS scheduled before speech therapy evaluation . Type Date User Summary Attachment  Provider Comments 09/03/2022  1:18 PM Johney Maine, RN Provider Comments -  Note: Evaluation for aspiration on recent studies

## 2022-09-07 ENCOUNTER — Telehealth: Payer: Self-pay

## 2022-09-07 ENCOUNTER — Other Ambulatory Visit: Payer: Self-pay

## 2022-09-07 ENCOUNTER — Inpatient Hospital Stay: Payer: Medicare Other

## 2022-09-07 ENCOUNTER — Encounter: Payer: Self-pay | Admitting: Infectious Diseases

## 2022-09-07 NOTE — Telephone Encounter (Signed)
Nutrition  Daughter, [name] called and left message for RD cancelling nutrition phone follow-up visit for today. No plans to reschedule at this time.   Kimmberly Wisser B. Zenia Resides, [place], [place] Registered Dietitian [phone]

## 2022-09-08 ENCOUNTER — Other Ambulatory Visit: Payer: Self-pay | Admitting: Infectious Diseases

## 2022-09-08 ENCOUNTER — Other Ambulatory Visit: Payer: Self-pay | Admitting: Internal Medicine

## 2022-09-08 MED ORDER — CEPHALEXIN 500 MG PO CAPS: 500.0000 mg | ORAL_CAPSULE | Freq: Three times a day (TID) | ORAL | 0 refills | Status: DC

## 2022-09-08 NOTE — Progress Notes (Signed)
Sent 1 more week of keflex to [facility]

## 2022-09-09 ENCOUNTER — Ambulatory Visit
Admission: RE | Admit: 2022-09-09 | Discharge: 2022-09-09 | Disposition: A | Discharge status: Home / Self Care | Payer: Medicare Other | Source: Ambulatory Visit | Attending: Internal Medicine | Admitting: Internal Medicine

## 2022-09-09 DIAGNOSIS — C7931 Secondary malignant neoplasm of brain: Secondary | ICD-10-CM | POA: Insufficient documentation

## 2022-09-09 DIAGNOSIS — C3490 Malignant neoplasm of unspecified part of unspecified bronchus or lung: Secondary | ICD-10-CM | POA: Diagnosis present

## 2022-09-09 DIAGNOSIS — C3492 Malignant neoplasm of unspecified part of left bronchus or lung: Secondary | ICD-10-CM | POA: Insufficient documentation

## 2022-09-09 LAB — GLUCOSE, CAPILLARY: Glucose-Capillary: 104 mg/dL — ABNORMAL HIGH (ref 70–99)

## 2022-09-09 MED ORDER — GADOBUTROL 1 MMOL/ML IV SOLN
7.0000 mL | Freq: Once | INTRAVENOUS | Status: AC | PRN
  Administered 2022-09-09: 7 mL via INTRAVENOUS

## 2022-09-09 MED ORDER — FLUDEOXYGLUCOSE F - 18 (FDG) INJECTION
7.7000 | Freq: Once | INTRAVENOUS | Status: AC
  Administered 2022-09-09: 7.99 via INTRAVENOUS

## 2022-09-13 ENCOUNTER — Encounter: Payer: Medicare Other | Admitting: Speech Pathology

## 2022-09-13 ENCOUNTER — Inpatient Hospital Stay: Payer: Medicare Other | Attending: Internal Medicine | Admitting: Internal Medicine

## 2022-09-13 ENCOUNTER — Inpatient Hospital Stay: Payer: Medicare Other

## 2022-09-13 ENCOUNTER — Encounter: Payer: Self-pay | Admitting: Internal Medicine

## 2022-09-13 VITALS — BP 115/77 | HR 84 | Temp 98.3°F | Wt 143.4 lb

## 2022-09-13 DIAGNOSIS — C7931 Secondary malignant neoplasm of brain: Secondary | ICD-10-CM | POA: Diagnosis present

## 2022-09-13 DIAGNOSIS — C3492 Malignant neoplasm of unspecified part of left bronchus or lung: Secondary | ICD-10-CM

## 2022-09-13 DIAGNOSIS — C3490 Malignant neoplasm of unspecified part of unspecified bronchus or lung: Secondary | ICD-10-CM

## 2022-09-13 DIAGNOSIS — Z7901 Long term (current) use of anticoagulants: Secondary | ICD-10-CM | POA: Insufficient documentation

## 2022-09-13 DIAGNOSIS — I4891 Unspecified atrial fibrillation: Secondary | ICD-10-CM | POA: Diagnosis not present

## 2022-09-13 DIAGNOSIS — Z452 Encounter for adjustment and management of vascular access device: Secondary | ICD-10-CM | POA: Insufficient documentation

## 2022-09-13 DIAGNOSIS — C3432 Malignant neoplasm of lower lobe, left bronchus or lung: Secondary | ICD-10-CM | POA: Diagnosis present

## 2022-09-13 DIAGNOSIS — R1319 Other dysphagia: Secondary | ICD-10-CM

## 2022-09-13 LAB — CBC WITH DIFFERENTIAL/PLATELET
Abs Immature Granulocytes: 0.09 10*3/uL — ABNORMAL HIGH (ref 0.00–0.07)
Basophils Absolute: 0 10*3/uL (ref 0.0–0.1)
Basophils Relative: 0 %
Eosinophils Absolute: 0 10*3/uL (ref 0.0–0.5)
Eosinophils Relative: 0 %
HCT: 41.8 % (ref 39.0–52.0)
Hemoglobin: 13.6 g/dL (ref 13.0–17.0)
Immature Granulocytes: 1 %
Lymphocytes Relative: 12 %
Lymphs Abs: 1.6 10*3/uL (ref 0.7–4.0)
MCH: 30.4 pg (ref 26.0–34.0)
MCHC: 32.5 g/dL (ref 30.0–36.0)
MCV: 93.5 fL (ref 80.0–100.0)
Monocytes Absolute: 0.8 10*3/uL (ref 0.1–1.0)
Monocytes Relative: 7 %
Neutro Abs: 10.3 10*3/uL — ABNORMAL HIGH (ref 1.7–7.7)
Neutrophils Relative %: 80 %
Platelets: 496 10*3/uL — ABNORMAL HIGH (ref 150–400)
RBC: 4.47 MIL/uL (ref 4.22–5.81)
RDW: 14.2 % (ref 11.5–15.5)
WBC: 13 10*3/uL — ABNORMAL HIGH (ref 4.0–10.5)
nRBC: 0 % (ref 0.0–0.2)

## 2022-09-13 LAB — COMPREHENSIVE METABOLIC PANEL
ALT: 16 U/L (ref 0–44)
AST: 26 U/L (ref 15–41)
Albumin: 3.3 g/dL — ABNORMAL LOW (ref 3.5–5.0)
Alkaline Phosphatase: 194 U/L — ABNORMAL HIGH (ref 38–126)
Anion gap: 14 (ref 5–15)
BUN: 19 mg/dL (ref 8–23)
CO2: 23 mmol/L (ref 22–32)
Calcium: 8.3 mg/dL — ABNORMAL LOW (ref 8.9–10.3)
Chloride: 95 mmol/L — ABNORMAL LOW (ref 98–111)
Creatinine, Ser: 0.66 mg/dL (ref 0.61–1.24)
GFR, Estimated: >60 mL/min (ref >60)
Glucose, Bld: 181 mg/dL — ABNORMAL HIGH (ref 70–99)
Potassium: 3.7 mmol/L (ref 3.5–5.1)
Sodium: 132 mmol/L — ABNORMAL LOW (ref 135–145)
Total Bilirubin: 0.7 mg/dL (ref 0.3–1.2)
Total Protein: 6.8 g/dL (ref 6.5–8.1)

## 2022-09-13 LAB — TSH: TSH: 5.304 u[IU]/mL — ABNORMAL HIGH (ref 0.350–4.500)

## 2022-09-13 MED ORDER — HEPARIN SOD (PORK) LOCK FLUSH 100 UNIT/ML IV SOLN
500.0000 [IU] | Freq: Once | INTRAVENOUS | Status: AC
  Administered 2022-09-13: 500 [IU] via INTRAVENOUS
  Filled 2022-09-13: qty 5

## 2022-09-13 NOTE — Progress Notes (Signed)
Jason NOTE  Patient Care Team: Baxter Hire, Jason as PCP - General (Internal Medicine) [name], Jason as Oncology Nurse Navigator [name], Jason as Consulting Physician (Hematology and Oncology) [name], Jason as Registered Nurse (Oncology)  CHIEF COMPLAINTS/PURPOSE OF CONSULTATION: Lung cancer  #  Oncology History Overview Note  # NOV 2021- LEFT LOWER LOBE NON-SMALL CELL CA [favor adeno ca]; T2N3 [right hilar; subcarinal LN;[name]; NOV 2021-MRI [place]  # 11/30- carbo-Taxol-RT [RT until 10/23/20]; s/p IMFINZI on 2/24. [38 months]; finished February 2023.  # #Brain metastases-March 2023-MRI-brain 2.1 cm peripherally enhancing lesion in the right paracentral lobule concerning for metastatic disease. There is mild perilesional edema and regional mass effect but no midline shift. On dexamethasone 2 mg a day.  Noted to have significant movement of the left lower extremity weakness.  Plan with SBRT on 3/22.  # SEP 20th, 2023- Keytruda [PDL-1:90%]  # Right lower lobe - ~4.6 x 3.1 cm  [PET 07-2020]- demonstrates no significant hypermetabolic activity (SUV max 1.8]-benign. STABLE.   # OCT 1MM-0375Beryle Flock- disease in the tracheoesophageal groove-proceed with Keytruda  [PD-L1-90 percent]  # A.fib [Eliquis; Dr.Kowalski]  # # SURVIVORSHIP:   # GENETICS:   #NGS-ordered  DIAGNOSIS: Lung cancer  STAGE:   III      ;  GOALS:  cure  CURRENT/MOST RECENT THERAPY : carbo-Taxol-RT ]   Stage 4 lung cancer, left ([place])  08/14/2020 Initial Diagnosis   Cancer of lower lobe of left lung ([place])   09/02/2020 - 10/22/2020 Chemotherapy    Patient is on Treatment Plan: LUNG DURVALUMAB Q14D      10/16/2020 Cancer Staging   Staging form: Lung, AJCC 8th Edition - Clinical: Stage IIIB (cT2a, cN3, cM0) - Signed by [name], Jason on 10/16/2020   12/17/2020 - 11/27/2021 Chemotherapy   Patient is on Treatment Plan : LUNG Durvalumab  q14d     07/09/2022 -  Chemotherapy   Patient is on Treatment Plan : LUNG NSCLC Pembrolizumab (200) q21d      HISTORY OF PRESENTING ILLNESS: Patient is ambulating with the wheel chair.  He is accompanied by his daughter.   Jason Warren 88 y.o.  male patient with currently stage IV [metachronus stage III lung non-small cell lung; s/p treatment feb 2023 with Brain mets status post [facility] MARCH 2023 is here for a follow up/proceed with immunotherapy.  Patient also on antibiotics- on Clarithromycin; and doxy for his atypical mycobacterial skin infection. Pt is here to review the results of his PET scan.  Patient here for follow up. Patient complains of difficulty swallowing, unable to eat due to it.Patient states that when he tries to swallow solids he gets sick to his stomach. Patient has no appetite at all. He has lost 5 lbs since previous visit.  Patient noted to have improvement of his left lower extremity swelling/pain and redness.  He is currently on second week of Keflex as per ID.  No more falls.  Continues on Eliquis.  Review of Systems  Constitutional:  Positive for malaise/fatigue. Negative for chills, diaphoresis, fever and weight loss.  HENT:  Negative for nosebleeds and sore throat.   Eyes:  Negative for double vision.  Respiratory:  Negative for hemoptysis, sputum production, shortness of breath and wheezing.   Cardiovascular:  Positive for leg swelling. Negative for chest pain, palpitations and orthopnea.  Gastrointestinal:  Negative for abdominal pain, blood in stool, constipation, diarrhea, heartburn, melena, nausea and vomiting.  Genitourinary:  Negative for dysuria and urgency.  Musculoskeletal:  Negative for back pain and joint pain.  Skin:  Negative for itching.  Neurological:  Positive for focal weakness. Negative for tingling, weakness and headaches.  Endo/Heme/Allergies:  Does not bruise/bleed easily.  Psychiatric/Behavioral:  Negative for depression. The patient is not  nervous/anxious and does not have insomnia.      MEDICAL HISTORY:  Past Medical History:  Diagnosis Date   Arthritis    Dysrhythmia    A-fib   Hip pain    left   HOH (hard of hearing)    LBBB (left bundle branch block) 08/05/2020   Lung cancer ([place])    Skin cancer, basal cell     face top of head    SURGICAL HISTORY: Past Surgical History:  Procedure Laterality Date   CATARACT EXTRACTION W/PHACO Right 07/13/2017   Procedure: CATARACT EXTRACTION PHACO AND INTRAOCULAR LENS PLACEMENT ([place]);  Surgeon: Leandrew Koyanagi, Jason;  Location: [facility];  Service: Ophthalmology;  Laterality: Right;  IVA TOPICAL RIGHT   CATARACT EXTRACTION W/PHACO Left 01/04/2018   Procedure: CATARACT EXTRACTION PHACO AND INTRAOCULAR LENS PLACEMENT ([place]) LEFT;  Surgeon: Leandrew Koyanagi, Jason;  Location: [facility];  Service: Ophthalmology;  Laterality: Left;   COLONOSCOPY     FEMUR FRACTURE SURGERY Right 01/03/2022   INTRAMEDULLARY (IM) NAIL INTERTROCHANTERIC Right 01/03/2022   Procedure: INTRAMEDULLARY (IM) NAIL INTERTROCHANTRIC;  Surgeon: Corky Mull, Jason;  Location: [facility];  Service: Orthopedics;  Laterality: Right;   IR IMAGING GUIDED PORT INSERTION  08/27/2020   JOINT REPLACEMENT     Left total hip Dr. Su Hoff 08-04-18   MOHS SURGERY  06/01/2022   ROTATOR CUFF REPAIR Right    SKIN CANCER EXCISION     face   TOTAL HIP ARTHROPLASTY Left 08/04/2018   Procedure: TOTAL HIP ARTHROPLASTY ANTERIOR APPROACH;  Surgeon: Frederik Pear, Jason;  Location: [facility];  Service: Orthopedics;  Laterality: Left;   VIDEO BRONCHOSCOPY WITH ENDOBRONCHIAL NAVIGATION N/A 08/07/2020   Procedure: VIDEO BRONCHOSCOPY WITH ENDOBRONCHIAL NAVIGATION;  Surgeon: Ottie Glazier, Jason;  Location: [facility];  Service: Thoracic;  Laterality: N/A;   VIDEO BRONCHOSCOPY WITH ENDOBRONCHIAL ULTRASOUND N/A 08/07/2020   Procedure: VIDEO BRONCHOSCOPY WITH ENDOBRONCHIAL ULTRASOUND;  Surgeon: Ottie Glazier, Jason;  Location:  [facility];  Service: Thoracic;  Laterality: N/A;    SOCIAL HISTORY: Social History   Socioeconomic History   Marital status: Married    Spouse name: Not on file   Number of children: Not on file   Years of education: Not on file   Highest education level: Not on file  Occupational History   Not on file  Tobacco Use   Smoking status: Former    Packs/day: 1.00    Years: 4.00    Total pack years: 4.00    Types: Cigarettes   Smokeless tobacco: Never   Tobacco comments:    quit early 70's  Vaping Use   Vaping Use: Never used  Substance and Sexual Activity   Alcohol use: No   Drug use: No   Sexual activity: Not Currently  Other Topics Concern   Not on file  Social History Narrative   > quit 35 years; smoked for 15 years. Rare alcohol. 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    Social Determinants of Health   Financial Resource Strain: Not on file  Food Insecurity: Not on file  Transportation Needs: Not on file  Physical  Activity: Not on file  Stress: Not on file  Social Connections: Not on file  Intimate Partner Violence: Not on file    FAMILY HISTORY: Family History  Problem Relation Age of Onset   Throat cancer Brother         & lung cancer    ALLERGIES:  has No Known Allergies.  MEDICATIONS:  Current Outpatient Medications  Medication Sig Dispense Refill   acetaminophen (TYLENOL) 500 MG tablet Take 1 tablet (500 mg total) by mouth every 6 (six) hours as needed. 30 tablet 0   apixaban (ELIQUIS) 5 MG TABS tablet Take 5 mg by mouth 2 (two) times daily.     cephALEXin (KEFLEX) 500 MG capsule Take 1 capsule (500 mg total) by mouth 3 (three) times daily. 21 capsule 0   cephALEXin (KEFLEX) 500 MG capsule Take 1 capsule (500 mg total) by mouth 3 (three) times daily. 21 capsule 0   dronabinol (MARINOL) 2.5 MG capsule Take 1 capsule (2.5 mg total) by mouth 2 (two) times daily before a meal. 60 capsule 0    furosemide (LASIX) 20 MG tablet Take 1 tablet (20 mg total) by mouth daily. 30 tablet 3   gentamicin ointment (GARAMYCIN) 0.1 % Apply 1 Application topically 3 (three) times daily. 15 g 0   hydroxypropyl methylcellulose / hypromellose (ISOPTO TEARS / GONIOVISC) 2.5 % ophthalmic solution Place 1 drop into the left eye daily as needed for dry eyes.     lidocaine-prilocaine (EMLA) cream Apply 1 Application topically as needed. Apply small amount to port site at least 1 hour prior to it being accessed, cover with plastic wrap 30 g 1   Multiple Vitamin (MULTIVITAMIN) tablet Take 1 tablet by mouth daily.     ondansetron (ZOFRAN) 8 MG tablet One pill every 8 hours as needed for nausea/vomitting. 40 tablet 1   pravastatin (PRAVACHOL) 80 MG tablet Take 80 mg by mouth at bedtime.      prochlorperazine (COMPAZINE) 10 MG tablet Take 0.5-1 tablets (5-10 mg total) by mouth every 6 (six) hours as needed for nausea or vomiting. 30 tablet 0   saw palmetto 160 MG capsule Take 160 mg by mouth 2 (two) times daily.     traZODone (DESYREL) 50 MG tablet Take 1 tablet (50 mg total) by mouth at bedtime as needed for sleep. 30 tablet 3   No current facility-administered medications for this visit.   Facility-Administered Medications Ordered in Other Visits  Medication Dose Route Frequency Provider Last Rate Last Admin   heparin lock flush 100 UNIT/ML injection            heparin lock flush 100 unit/mL  500 Units Intravenous Once Charlaine Dalton R, Jason       sodium chloride flush (NS) 0.9 % injection 10 mL  10 mL Intravenous PRN Charlaine Dalton R, Jason   10 mL at 02/11/21 0835   sodium chloride flush (NS) 0.9 % injection 10 mL  10 mL Intravenous Once Borders, Kirt Boys, NP       PHYSICAL EXAMINATION: ECOG PERFORMANCE STATUS: 1 - Symptomatic but completely ambulatory  Vitals:   09/13/22 1301  BP: 115/77  Pulse: 84  Temp: 98.3 F (36.8 C)   Filed Weights   09/13/22 1301  Weight: 143 lb 6.4 oz (65 kg)    Bilateral upper lower extremity boils noted.  Physical Exam HENT:     Head: Normocephalic and atraumatic.     Mouth/Throat:     Pharynx: No oropharyngeal exudate.  Eyes:     Pupils: Pupils are equal, round, and reactive to light.  Cardiovascular:     Rate and Rhythm: Normal rate and regular rhythm.  Pulmonary:     Effort: Pulmonary effort is normal. No respiratory distress.     Breath sounds: No wheezing.     Comments: Slightly decreased breath sounds left lower lung base. Abdominal:     General: Bowel sounds are normal. There is no distension.     Palpations: Abdomen is soft. There is no mass.     Tenderness: There is no abdominal tenderness. There is no guarding or rebound.  Musculoskeletal:        General: No tenderness. Normal range of motion.     Cervical back: Normal range of motion and neck supple.  Skin:    General: Skin is warm.  Neurological:     Mental Status: He is alert and oriented to person, place, and time.  Psychiatric:        Mood and Affect: Affect normal.      LABORATORY DATA:  I have reviewed the data as listed Lab Results  Component Value Date   WBC 13.0 (H) 09/13/2022   HGB 13.6 09/13/2022   HCT 41.8 09/13/2022   MCV 93.5 09/13/2022   PLT 496 (H) 09/13/2022   Recent Labs    08/23/22 0856 09/01/22 0911 09/13/22 1240  NA 134* 133* 132*  K 4.2 4.0 3.7  CL 100 99 95*  CO2 _0 GLUCOSE 140* 155* 181*  BUN _1 CREATININE 0.83 0.75 0.66  CALCIUM 9.0 8.8* 8.3*  GFRNONAA >60 >60 >60  PROT 7.0 6.9 6.8  ALBUMIN 3.5 3.3* 3.3*  AST _2 ALT _3 ALKPHOS 167* 162* 194*  BILITOT 0.7 0.8 0.7    RADIOGRAPHIC STUDIES: I have personally reviewed the radiological images as listed and agreed with the findings in the report. NM PET Image Restage (PS) Skull Base to Thigh (F-18 FDG)  Result Date: 09/11/2022 CLINICAL DATA:  Subsequent treatment strategy for recurrent non-small cell lung cancer. EXAM: NUCLEAR MEDICINE PET SKULL  BASE TO THIGH TECHNIQUE: 7.99 mCi F-18 FDG was injected intravenously. Full-ring PET imaging was performed from the skull base to thigh after the radiotracer. CT data was obtained and used for attenuation correction and anatomic localization. Fasting blood glucose: 104 mg/dl COMPARISON:  05/27/2022 FINDINGS: Mediastinal blood pool activity: SUV max 1.67 Liver activity: SUV max NA NECK: No hypermetabolic lymph nodes in the neck. Incidental CT findings: Warren. CHEST: No tracer avid supraclavicular or axillary lymph nodes. Mediastinal lymph node posterior to the trachea measures 2.5 by 3.0 cm and has an SUV max of 5.33, image 81/2. On the previous exam this measured 2.2 x 2.4 cm with SUV max of 3.81. No additional tracer avid mediastinal or hilar lymph nodes. No tracer avid pulmonary nodule or mass. Incidental CT findings: Left upper lobe perihilar and paramediastinal radiation change is identified. Consolidation and architectural distortion within the perihilar right lower lobe is also again seen and appears unchanged presumed to represent post treatment changes. There is a small left pleural effusion which appears similar to the previous exam. Aortic atherosclerosis and coronary artery calcifications. Mild cardiac enlargement. No pericardial effusion. ABDOMEN/PELVIS: No abnormal tracer uptake within the liver, pancreas, spleen, or adrenal glands. No tracer avid abdominopelvic lymph nodes. Incidental CT findings: Aortic atherosclerosis. Right posterolateral bladder diverticula is again noted. SKELETON: Tracer avid lesion involving the left olecranon is again  seen with associated underlying lytic lesion. The lytic lesion measures 1.1 cm and has an SUV max of 5.71, image 162/2. Left knee. Previously 0.9 cm with SUV max of 6.28. Previously noted tracer avid cutaneous nodules within the left upper arm are no longer visualized. Incidental CT findings: Vertebral plana deformity is again seen at the T8 level. Mild compression  fracture involving the L1 vertebra is identified and appears unchanged from prior exam. IMPRESSION: 1. Mild increase in size and degree of FDG uptake associated with the metastatic mediastinal lymph node posterior to the trachea. 2. Similar size and degree of uptake associated with the tracer avid lytic lesion involving the left olecranon. 3. No new sites of disease. 4.  Aortic Atherosclerosis (ICD10-I70.0). Electronically Signed   By: Kerby Moors M.D.   On: 09/11/2022 15:23   MR BRAIN W WO CONTRAST  Result Date: 09/10/2022 CLINICAL DATA:  Brain metastases suspected, lung cancer EXAM: MRI HEAD WITHOUT AND WITH CONTRAST TECHNIQUE: Multiplanar, multiecho pulse sequences of the brain and surrounding structures were obtained without and with intravenous contrast. CONTRAST:  49m GADAVIST GADOBUTROL 1 MMOL/ML IV SOLN COMPARISON:  05/11/2022 FINDINGS: Brain: Further interval decrease in the size of a peripherally enhancing lesion in the posteromedial right frontal lobe, which now measures 5 x 10 x 9 mm (AP x TR x CC) (series 19, image 132 and series 20, image 12), previously 9 x 9 x 9 mm. Slightly decreased associated edema. No new enhancing lesions to suggest additional metastatic disease. The aforementioned lesion is associated with some restricted diffusion. No other foci of restricted diffusion to suggest acute or subacute infarct. No acute hemorrhage, mass, mass effect, or midline shift. No hydrocephalus or extra-axial collection. Unchanged punctate microhemorrhages in the left temporal lobe and right parietal lobe. T2 hyperintense signal in the periventricular white matter, likely the sequela of mild chronic small vessel ischemic disease. Vascular: Normal arterial flow voids. Normal arterial and venous enhancement. Skull and upper cervical spine: Normal marrow signal. Sinuses/Orbits: Clear paranasal sinuses. Status post bilateral lens replacements. Other: The mastoids are well aerated. IMPRESSION: 1. Further  interval decrease in the size of a peripherally enhancing lesion in the posteromedial right frontal lobe, with slightly decreased associated edema. 2. No new enhancing lesions to suggest additional metastatic disease. Electronically Signed   By: AMerilyn BabaM.D.   On: 09/10/2022 16:00   DG ESOPHAGUS W SINGLE CM (SOL OR THIN BA)  Result Date: 09/02/2022 CLINICAL DATA:  Patient reports difficulty swallowing causing coughing and regurgitation. History of lung cancer EXAM: ESOPHAGUS/BARIUM SWALLOW/TABLET STUDY TECHNIQUE: Single contrast examination was performed using thin liquid barium. This exam was performed by MReatha Armour PA-C , and was supervised and interpreted by Dr PValetta Mole FLUOROSCOPY: Radiation Exposure Index (as provided by the fluoroscopic device): 1.90 mGy Kerma COMPARISON:  Warren Available. FINDINGS: Exam significantly limited by aspiration on first swallow, causing termination of exam. Swallowing: Aspiration visualized on first swallow of thin contrast material. Patient reports being unable to cough to clear airway. Pharynx: Unremarkable. Esophagus: Not evaluated Esophageal motility: Not evaluated Hiatal Hernia: Not evaluated Gastroesophageal reflux: Not evaluated Ingested 165mbarium tablet: Not given Other: Warren. IMPRESSION: Significantly limited single contrast esophagram with aspiration of thin liquid barium visualized on first swallow. Electronically Signed   By: PeValetta Mole.D.   On: 09/02/2022 09:31     ASSESSMENT & PLAN:   Stage 4 lung [place] IV- -non-small cell lung cancer-[favor adenocarcinoma] [at dx-stage III- s/p  concurrent chemoradiation;  s/p  adjuvant durvalumab; finished Feb 2023]; with metachronous brain metastases [March 2023];   AUG-2023-CT/PET scan-  Tracheal esophageal lymph node displays moderate increased metabolic activity in signs of central necrosis compatible with metastatic lymph node. Signs of bony metastasis to the LEFT olecranon.   Patient currently on single agent Keytruda [ PD-L1-90 percent]. currently s/p keytruda cycle #3;   DEC 9th, 2023-PET scan: Mild increase in size and degree of FDG uptake associated with the metastatic mediastinal lymph node posterior to the trachea; Similar size and degree of uptake associated with the tracer avid lytic lesion involving the left olecranon; No new sites of disease.   # HOLD ketytruda cycle #4- Labs today reviewed; patient tolerating treatments with moderate to severe difficulties -see discussion in below  #Left olecranon metastases-continue monitoring for pain; if worse would recommend radiation.   # Brain metastases--s/p SBRT [March, 23rrd-2023] MRI- DEC 2023- Further interval decrease in the size of a peripherally enhancing lesion in the posteromedial right frontal lobe, with slightly decreased associated edema;  No new enhancing lesions to suggest additional metastatic disease.   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  Continue Tylenol prn. Discussed with Dr.Ravishankar.   # Weight loss/lack of appetite-multifactorial-underlying malignancy; possible side effect of Keytruda; also secondary to aspiration limited choice of foods-recommend evaluation with speech pathology/MBS as planned tomorrow.  Also see discussion below regarding prognosis  # Constipation: Status post laxatives stool softener in the past.  Miralax- recommend BID- TID prn; if not improved more frequently.  # A. fib on Eliquis [Dr.Kowalski]-  Mild swelling in legs- EF in Feb 2022- 55-60%- STABLE.  Discussed regarding importance of avoiding falls while on anticoagulation.  # Right hip fracture status post ORIF.  Nonpathologic. On  physical therapy- STABLE.  #  [facility]- functional stable.  # Vaccination- flu shots today; covid- OK.   # PROGNOSIS: Given the incurable nature of the disease /poor tolerance of therapy and in general less than 6 months of life expectancy-I introduced hospice philosophy to the patient and family.  Discussed that goal of care should be directed to symptom management rather than treating the underlying disease; and in the process help improve quality of life rather than quantity.  Discussed with hospice team would include-nurse, nurse aide, social worker and chaplain for help take care of patient with physical/emotional needs.  After lengthy discussion patient family agreed to meet with hospice.  However no decisions made regarding hospice enrollment at this time.  I will continue to follow patient in the clinic.   # DISPOSITION: # referral to hospice re: lung cancer- please call daughter-Joan  # NO keytruda today; De-access # as per IS follow up - Jason: labs- cbc/cmp;  port- KEYTRUDA; - Dr.B  # I reviewed the blood work- with the patient in detail; also reviewed the imaging independently [as summarized above]; and with the patient in detail.   # 40 minutes face-to-face with the patient discussing the above plan of care; more than 50% of time spent on prognosis/ natural history; counseling and coordination.              All questions were answered. The patient knows to call the clinic with any problems, questions or concerns.   [name], Jason 09/13/2022 2:34 PM

## 2022-09-13 NOTE — Assessment & Plan Note (Addendum)
#  Stage IV- -non-small cell lung cancer-[favor adenocarcinoma] [at dx-stage III- s/p  concurrent chemoradiation; s/p  adjuvant durvalumab; finished Feb 2023]; with metachronous brain metastases [March 2023];   AUG-2023-CT/PET scan-  Tracheal esophageal lymph node displays moderate increased metabolic activity in signs of central necrosis compatible with metastatic lymph node. Signs of bony metastasis to the LEFT olecranon.  Patient currently on single agent Keytruda [ PD-L1-90 percent]. currently s/p keytruda cycle #3;   DEC 9th, 2023-PET scan: Mild increase in size and degree of FDG uptake associated with the metastatic mediastinal lymph node posterior to the trachea; Similar size and degree of uptake associated with the tracer avid lytic lesion involving the left olecranon; No new sites of disease.   # HOLD ketytruda cycle #4- Labs today reviewed; patient tolerating treatments with moderate to severe difficulties -see discussion in below  #Left olecranon metastases-continue monitoring for pain; if worse would recommend radiation.   # Brain metastases--s/p SBRT [March, 23rrd-2023] MRI- DEC 2023- Further interval decrease in the size of a peripherally enhancing lesion in the posteromedial right frontal lobe, with slightly decreased associated edema;  No new enhancing lesions to suggest additional metastatic disease.   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  Continue Tylenol prn. Discussed with Dr.Ravishankar.   # Weight loss/lack of appetite-multifactorial-underlying malignancy; possible side effect of Keytruda; also secondary to aspiration limited choice of foods-recommend evaluation with speech pathology/MBS as planned tomorrow.  Also see discussion  below regarding prognosis  # Constipation: Status post laxatives stool softener in the past.  Miralax- recommend BID- TID prn; if not improved more frequently.  # A. fib on Eliquis [Dr.Kowalski]-  Mild swelling in legs- EF in Feb 2022- 55-60%- STABLE.  Discussed regarding importance of avoiding falls while on anticoagulation.  # Right hip fracture status post ORIF.  Nonpathologic. On  physical therapy- STABLE.  # MediPort- functional stable.  # Vaccination- flu shots today; covid- OK.   # PROGNOSIS: Given the incurable nature of the disease /poor tolerance of therapy and in general less than 6 months of life expectancy-I introduced hospice philosophy to the patient and family.  Discussed that goal of care should be directed to symptom management rather than treating the underlying disease; and in the process help improve quality of life rather than quantity.  Discussed with hospice team would include-nurse, nurse aide, social worker and chaplain for help take care of patient with physical/emotional needs.  After lengthy discussion patient family agreed to meet with hospice.  However no decisions made regarding hospice enrollment at this time.  I will continue to follow patient in the clinic.   # DISPOSITION: # referral to hospice re: lung cancer- please call daughter-Joan  # NO keytruda today; De-access # as per IS follow up - MD: labs- cbc/cmp;  port- KEYTRUDA; - Dr.B  # I reviewed the blood work- with the patient in detail; also reviewed the imaging independently [as summarized above]; and with the patient in detail.   # 40 minutes face-to-face with the patient discussing the above plan of care; more than 50% of time spent on prognosis/ natural history; counseling and coordination.

## 2022-09-13 NOTE — Progress Notes (Signed)
Patient here for follow up. Patient complains of difficulty swallowing, unable to eat due to it.Patient states that when he tries to swallow solids he gets sick to his stomach. Patient has no appetite at all. He has lost 5 lbs since previous visit.

## 2022-09-14 ENCOUNTER — Other Ambulatory Visit: Payer: Self-pay

## 2022-09-14 ENCOUNTER — Ambulatory Visit: Payer: Medicare Other

## 2022-09-16 ENCOUNTER — Ambulatory Visit: Payer: Medicare Other | Admitting: Radiation Oncology

## 2022-09-16 ENCOUNTER — Encounter: Payer: Self-pay | Admitting: Infectious Diseases

## 2022-09-16 ENCOUNTER — Other Ambulatory Visit: Payer: Self-pay

## 2022-09-16 ENCOUNTER — Ambulatory Visit: Payer: Medicare Other

## 2022-09-16 ENCOUNTER — Other Ambulatory Visit: Payer: Self-pay | Admitting: Pharmacist

## 2022-09-16 ENCOUNTER — Telehealth: Payer: Self-pay

## 2022-09-16 ENCOUNTER — Ambulatory Visit: Attending: Infectious Diseases | Admitting: Infectious Diseases

## 2022-09-16 VITALS — BP 106/62 | HR 77 | Temp 97.3°F

## 2022-09-16 DIAGNOSIS — A318 Other mycobacterial infections: Secondary | ICD-10-CM | POA: Diagnosis present

## 2022-09-16 MED ORDER — LEVOFLOXACIN 25 MG/ML PO SOLN: 500.0000 mg | Freq: Every day | ORAL | 0 refills | Status: DC

## 2022-09-16 MED ORDER — DOXYCYCLINE CALCIUM 50 MG/5ML PO SYRP: 100.0000 mg | ORAL_SOLUTION | Freq: Two times a day (BID) | ORAL | 2 refills | Status: DC

## 2022-09-16 MED ORDER — CLARITHROMYCIN 250 MG/5ML PO SUSR: 500.0000 mg | Freq: Two times a day (BID) | ORAL | 1 refills | Status: DC

## 2022-09-16 MED ORDER — DOXYCYCLINE MONOHYDRATE 25 MG/5ML PO SUSR: 100.0000 mg | Freq: Two times a day (BID) | ORAL | 2 refills | Status: DC

## 2022-09-16 NOTE — Patient Instructions (Signed)
You are here for follow up of the left ankle and foot swelling Looks like it is worsening of the mycobacterium infection Will restart Calrithromycin 500mg  Po twice a day, Doxycycline 100mg  twice a day and levaquin 500mg  Po once a day- all the antibiotics will be liquid form- please read the label carefully for the dosing- you can start the firt 2 today and then add the third in a day or two

## 2022-09-16 NOTE — Telephone Encounter (Signed)
[name] with [facility] called stating the doxycycline suspension sent to them is discontinued.  Cassie with our pharmacy team has changed this RX to doxycycline syrup and Dr.; Delaine Lame is aware.  Patient's Levaquin solution is also out of stock at [facility] at this time and they have ordered it, but not sure if they will have it in by tomorrow.   Per Dr. Delaine Lame it is ok for the patient to go ahead and start the doxy and clarithromycin. [name] with [name] will call our office if they are unable to get the [facility] in stock soon.   I did advise [name] we are closed Friday afternoon and will follow up on Monday if we do not hear from them Friday morning. Patient's daughter [name] aware of all information and will contact us with any questions  Traeger Sultana [name]

## 2022-09-16 NOTE — Progress Notes (Signed)
F/u visit Here with family - 2 daughters  88 yr male with history of Afib, metastatic  lung cancer  s/p chemo, radiation, PDLI inhibitor and radiation to brain for mets.  On pembrulizumab Has mycobacterium chelonae skin and soft tissue infecitons of the extremities I first saw him in August and he was soon started on double  antibiotic therapy with clarithromycin/Doxy Then Levaquin was added but he could not tolerate levaquin because of poor appetite and he stopped it after a month His appetite had  not improved and he wanted to stop all antibiotics which he did 2 weeks ago Last  infusion of PDL1 inhibiotir 2 weeks ago He has swelling and redness left ankle area for 2 weeks now and has taken 2 weeks of keflex with no improvement and hence he is here to see me No fever or chills His appetite is borderline HE has some trouble swallowing pills Also has a pressure sore like area left buttock     Past Medical History:  Diagnosis Date   Arthritis    Dysrhythmia    A-fib   Hip pain    left   HOH (hard of hearing)    LBBB (left bundle branch block) 08/05/2020   Lung cancer ([place])    Skin cancer, basal cell     face top of head    Past Surgical History:  Procedure Laterality Date   CATARACT EXTRACTION W/PHACO Right 07/13/2017   Procedure: CATARACT EXTRACTION PHACO AND INTRAOCULAR LENS PLACEMENT ([facility] [place]);  Surgeon: Leandrew Koyanagi, MD;  Location: [facility];  Service: Ophthalmology;  Laterality: Right;  IVA TOPICAL RIGHT   CATARACT EXTRACTION W/PHACO Left 01/04/2018   Procedure: CATARACT EXTRACTION PHACO AND INTRAOCULAR LENS PLACEMENT ([place]) LEFT;  Surgeon: Leandrew Koyanagi, MD;  Location: [facility];  Service: Ophthalmology;  Laterality: Left;   COLONOSCOPY     FEMUR FRACTURE SURGERY Right 01/03/2022   INTRAMEDULLARY (IM) NAIL INTERTROCHANTERIC Right 01/03/2022   Procedure: INTRAMEDULLARY (IM) NAIL INTERTROCHANTRIC;  Surgeon: Corky Mull, MD;  Location: [facility]  ORS;  Service: Orthopedics;  Laterality: Right;   IR IMAGING GUIDED PORT INSERTION  08/27/2020   JOINT REPLACEMENT     Left total hip Dr. Su Hoff 08-04-18   MOHS SURGERY  06/01/2022   ROTATOR CUFF REPAIR Right    SKIN CANCER EXCISION     face   TOTAL HIP ARTHROPLASTY Left 08/04/2018   Procedure: TOTAL HIP ARTHROPLASTY ANTERIOR APPROACH;  Surgeon: Frederik Pear, MD;  Location: [facility];  Service: Orthopedics;  Laterality: Left;   VIDEO BRONCHOSCOPY WITH ENDOBRONCHIAL NAVIGATION N/A 08/07/2020   Procedure: VIDEO BRONCHOSCOPY WITH ENDOBRONCHIAL NAVIGATION;  Surgeon: Ottie Glazier, MD;  Location: [facility] ORS;  Service: Thoracic;  Laterality: N/A;   VIDEO BRONCHOSCOPY WITH ENDOBRONCHIAL ULTRASOUND N/A 08/07/2020   Procedure: VIDEO BRONCHOSCOPY WITH ENDOBRONCHIAL ULTRASOUND;  Surgeon: Ottie Glazier, MD;  Location: [facility] OR[facility];  Service: Thoracic;  Laterality: N/A;    Social History   Socioeconomic History   Marital status: Married    Spouse name: Not on file   Number of children: Not on file   Years of education: Not on file   Highest education level: Not on file  Occupational History   Not on file  Tobacco Use   Smoking status: Former    Packs/day: 1.00    Years: 4.00    Total pack years: 4.00    Types: Cigarettes   Smokeless tobacco: Never   Tobacco comments:    quit early 70's  Vaping Use  Vaping Use: Never used  Substance and Sexual Activity   Alcohol use: No   Drug use: No   Sexual activity: Not Currently  Other Topics Concern   Not on file  Social History Narrative   > quit 35 years; smoked for 15 years. Rare alcohol. 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.    Social Determinants of Health   Financial Resource Strain: Not on file  Food Insecurity: Not on file  Transportation Needs: Not on file  Physical Activity: Not on file  Stress: Not on file  Social Connections: Not on file  Intimate Partner  Violence: Not on file    Family History  Problem Relation Age of Onset   Throat cancer Brother         & lung cancer   No Known Allergies I? Current Outpatient Medications  Medication Sig Dispense Refill   acetaminophen (TYLENOL) 500 MG tablet Take 1 tablet (500 mg total) by mouth every 6 (six) hours as needed. 30 tablet 0   apixaban (ELIQUIS) 5 MG TABS tablet Take 5 mg by mouth 2 (two) times daily.     cephALEXin (KEFLEX) 500 MG capsule Take 1 capsule (500 mg total) by mouth 3 (three) times daily. 21 capsule 0   dronabinol (MARINOL) 2.5 MG capsule Take 1 capsule (2.5 mg total) by mouth 2 (two) times daily before a meal. 60 capsule 0   furosemide (LASIX) 20 MG tablet Take 1 tablet (20 mg total) by mouth daily. 30 tablet 3   gentamicin ointment (GARAMYCIN) 0.1 % Apply 1 Application topically 3 (three) times daily. 15 g 0   hydroxypropyl methylcellulose / hypromellose (ISOPTO TEARS / GONIOVISC) 2.5 % ophthalmic solution Place 1 drop into the left eye daily as needed for dry eyes.     lidocaine-prilocaine (EMLA) cream Apply 1 Application topically as needed. Apply small amount to port site at least 1 hour prior to it being accessed, cover with plastic wrap 30 g 1   Multiple Vitamin (MULTIVITAMIN) tablet Take 1 tablet by mouth daily.     ondansetron (ZOFRAN) 8 MG tablet One pill every 8 hours as needed for nausea/vomitting. 40 tablet 1   pravastatin (PRAVACHOL) 80 MG tablet Take 80 mg by mouth at bedtime.      prochlorperazine (COMPAZINE) 10 MG tablet Take 0.5-1 tablets (5-10 mg total) by mouth every 6 (six) hours as needed for nausea or vomiting. 30 tablet 0   saw palmetto 160 MG capsule Take 160 mg by mouth 2 (two) times daily.     traZODone (DESYREL) 50 MG tablet Take 1 tablet (50 mg total) by mouth at bedtime as needed for sleep. 30 tablet 3   cephALEXin (KEFLEX) 500 MG capsule Take 1 capsule (500 mg total) by mouth 3 (three) times daily. (Patient not taking: Reported on 09/16/2022) 21  capsule 0   No current facility-administered medications for this visit.   Facility-Administered Medications Ordered in Other Visits  Medication Dose Route Frequency Provider Last Rate Last Admin   heparin lock flush 100 UNIT/ML injection            heparin lock flush 100 unit/mL  500 Units Intravenous Once Charlaine Dalton R, MD       sodium chloride flush (NS) 0.9 % injection 10 mL  10 mL Intravenous PRN Charlaine Dalton R, MD   10 mL at 02/11/21 0835   sodium chloride flush (NS) 0.9 % injection  10 mL  10 mL Intravenous Once Borders, Kirt Boys, NP         Abtx:  Anti-infectives (From admission, onward)    None       REVIEW OF SYSTEMS:  Const: negative fever, negative chills, + +weight loss GI: poor appetite, severe constipation needing enema No energy fatigue  anxiety,  No cough or sob No head ache New skin nodules   O/e BP 106/62   Pulse 77   Temp (!) 97.3 F (36.3 C) (Temporal)   Pale, weak, emaciated, in wheel chair  Chest b/l air entry  [id number] Abd did not examine  Multiple purplish nodules scattered over extremities Left foot around ankle area swollen and erythematous- not tender Ankle movt complete Rt great toe swollen              Impression/Recommendation   Stage IV metastaic non small cell lung cancer - getting immune check point inhibitor treatment  Atypical mycobacteria skin infection in an immune compromised individual due to mycobacterium chelonae Pt has not been taking any meds for 2 weeks- had stopped clarithromycin/doxycycline and levaquin ( the 3rd was stopped a month ago) He had decided not take the meds due to poor appetite Now there is a flare up of the skin lesions Rt great toe and left ankle No left ankle joint involvement He will restart the three antibiotics in liquid form  Left ankle erythema and edema- was being treated as cellulitis with kneflex for 2 weeks with no improvement as this is likely Mycobacterium  chelonae. Will stop keflex  Metastatic lung cancer Weight loss Weakness   Discussed with patient and his family in great detail Follow up in jan 2024 ?

## 2022-09-17 ENCOUNTER — Encounter: Payer: Self-pay | Admitting: Infectious Diseases

## 2022-09-17 ENCOUNTER — Other Ambulatory Visit: Payer: Self-pay

## 2022-09-20 MED ORDER — CLARITHROMYCIN 500 MG PO TABS: 500.0000 mg | ORAL_TABLET | Freq: Two times a day (BID) | ORAL | 1 refills | Status: DC

## 2022-09-20 MED ORDER — LEVOFLOXACIN 500 MG PO TABS: 500.0000 mg | ORAL_TABLET | Freq: Every day | ORAL | 1 refills | Status: DC

## 2022-09-20 MED ORDER — DOXYCYCLINE MONOHYDRATE 100 MG PO TABS: 100.0000 mg | ORAL_TABLET | Freq: Two times a day (BID) | ORAL | 1 refills | Status: DC

## 2022-09-21 ENCOUNTER — Encounter: Payer: Self-pay | Admitting: Internal Medicine

## 2022-09-21 ENCOUNTER — Other Ambulatory Visit (HOSPITAL_COMMUNITY): Payer: Self-pay

## 2022-09-28 ENCOUNTER — Encounter: Payer: Self-pay | Admitting: Internal Medicine

## 2022-10-01 ENCOUNTER — Telehealth: Payer: Self-pay | Admitting: *Deleted

## 2022-10-01 NOTE — Telephone Encounter (Signed)
Nurse received call from hospice nurse on triage line regarding pt having "angry red left ankle and foot".  Call returned and message left on voicemail.

## 2022-10-06 ENCOUNTER — Encounter: Payer: Self-pay | Admitting: Internal Medicine

## 2022-10-06 ENCOUNTER — Inpatient Hospital Stay: Payer: Medicare Other

## 2022-10-06 ENCOUNTER — Inpatient Hospital Stay: Payer: Medicare Other | Attending: Internal Medicine | Admitting: Internal Medicine

## 2022-10-06 DIAGNOSIS — C3492 Malignant neoplasm of unspecified part of left bronchus or lung: Secondary | ICD-10-CM | POA: Diagnosis not present

## 2022-10-06 DIAGNOSIS — Z452 Encounter for adjustment and management of vascular access device: Secondary | ICD-10-CM | POA: Insufficient documentation

## 2022-10-06 DIAGNOSIS — I4891 Unspecified atrial fibrillation: Secondary | ICD-10-CM | POA: Insufficient documentation

## 2022-10-06 DIAGNOSIS — Z7901 Long term (current) use of anticoagulants: Secondary | ICD-10-CM | POA: Insufficient documentation

## 2022-10-06 DIAGNOSIS — C3432 Malignant neoplasm of lower lobe, left bronchus or lung: Secondary | ICD-10-CM | POA: Diagnosis not present

## 2022-10-06 DIAGNOSIS — C7931 Secondary malignant neoplasm of brain: Secondary | ICD-10-CM | POA: Diagnosis present

## 2022-10-06 DIAGNOSIS — Z95828 Presence of other vascular implants and grafts: Secondary | ICD-10-CM

## 2022-10-06 LAB — CBC WITH DIFFERENTIAL/PLATELET
Abs Immature Granulocytes: 0.09 10*3/uL — ABNORMAL HIGH (ref 0.00–0.07)
Basophils Absolute: 0 10*3/uL (ref 0.0–0.1)
Basophils Relative: 0 %
Eosinophils Absolute: 0.1 10*3/uL (ref 0.0–0.5)
Eosinophils Relative: 1 %
HCT: 38.6 % — ABNORMAL LOW (ref 39.0–52.0)
Hemoglobin: 13 g/dL (ref 13.0–17.0)
Immature Granulocytes: 1 %
Lymphocytes Relative: 16 %
Lymphs Abs: 1.5 10*3/uL (ref 0.7–4.0)
MCH: 31 pg (ref 26.0–34.0)
MCHC: 33.7 g/dL (ref 30.0–36.0)
MCV: 91.9 fL (ref 80.0–100.0)
Monocytes Absolute: 0.7 10*3/uL (ref 0.1–1.0)
Monocytes Relative: 8 %
Neutro Abs: 7.2 10*3/uL (ref 1.7–7.7)
Neutrophils Relative %: 74 %
Platelets: 391 10*3/uL (ref 150–400)
RBC: 4.2 MIL/uL — ABNORMAL LOW (ref 4.22–5.81)
RDW: 15 % (ref 11.5–15.5)
WBC: 9.7 10*3/uL (ref 4.0–10.5)
nRBC: 0 % (ref 0.0–0.2)

## 2022-10-06 LAB — COMPREHENSIVE METABOLIC PANEL
ALT: 14 U/L (ref 0–44)
AST: 22 U/L (ref 15–41)
Albumin: 3.2 g/dL — ABNORMAL LOW (ref 3.5–5.0)
Alkaline Phosphatase: 173 U/L — ABNORMAL HIGH (ref 38–126)
Anion gap: 10 (ref 5–15)
BUN: 19 mg/dL (ref 8–23)
CO2: 25 mmol/L (ref 22–32)
Calcium: 8.1 mg/dL — ABNORMAL LOW (ref 8.9–10.3)
Chloride: 99 mmol/L (ref 98–111)
Creatinine, Ser: 0.69 mg/dL (ref 0.61–1.24)
GFR, Estimated: >60 mL/min (ref >60)
Glucose, Bld: 121 mg/dL — ABNORMAL HIGH (ref 70–99)
Potassium: 3.9 mmol/L (ref 3.5–5.1)
Sodium: 134 mmol/L — ABNORMAL LOW (ref 135–145)
Total Bilirubin: 0.9 mg/dL (ref 0.3–1.2)
Total Protein: 6.4 g/dL — ABNORMAL LOW (ref 6.5–8.1)

## 2022-10-06 MED ORDER — HEPARIN SOD (PORK) LOCK FLUSH 100 UNIT/ML IV SOLN
500.0000 [IU] | Freq: Once | INTRAVENOUS | Status: AC
  Administered 2022-10-06: 500 [IU] via INTRAVENOUS
  Filled 2022-10-06: qty 5

## 2022-10-06 NOTE — Assessment & Plan Note (Addendum)
#  Stage IV- -non-small cell lung cancer-[favor adenocarcinoma] [at dx-stage III- s/p  concurrent chemoradiation; s/p  adjuvant durvalumab; finished Feb 2023]; with metachronous brain metastases [March 2023];   AUG-2023-CT/PET scan-  Tracheal esophageal lymph node displays moderate increased metabolic activity in signs of central necrosis compatible with metastatic lymph node. Signs of bony metastasis to the LEFT olecranon.  Patient currently on single agent Keytruda [ PD-L1-90 percent]. currently s/p keytruda cycle #3; however September 11, 2022 PET scan shows progressive metastatic mediastinal lymph node; lytic lesion of the left olecranon.  # Recommend discontinuation of therapy given the progressive disease; declining performance status-and the inability to withstand any aggressive chemotherapeutic regimens.  I would recommend continuation of hospice.  # Brain metastases--s/p SBRT [March, 23rrd-2023] MRI- DEC 2023- Further interval decrease in the size of a peripherally enhancing lesion in the posteromedial right frontal lobe, with slightly decreased associated edema;  No new enhancing lesions to suggest additional metastatic disease.  Given the recent falls possible progression/recurrence-however hold off any further workup.  Continue hospice.  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 Right ankle-redness new cellutis- Keflex x2  weeks-defer to Dr.Ravishankar for further recommendations.  # A. fib on Eliquis [Dr.Kowalski]-  Mild swelling in legs- EF in Feb 2022- 55-60%- STABLE.  Discussed regarding importance of avoiding falls while on anticoagulation.  # MediPort- functional stable; port flush continue under hospice..  # PROGNOSIS: Discussed that the life expectancy on average is few months.  Patient states that he is at peace with his current situation.  Patient  will continue hospice for now.  Patient will follow-up with [place] only as needed.  # DISPOSITION: # DE-Access; NO keytruda  # follow up needed- Dr.B

## 2022-10-06 NOTE — Progress Notes (Signed)
[place] NOTE  Patient Care Team: Baxter Hire, MD as PCP - General (Internal Medicine) [name], RN as Oncology Nurse Navigator [name], MD as Consulting Physician (Hematology and Oncology) [name], RN as Registered Nurse (Oncology)  CHIEF COMPLAINTS/PURPOSE OF CONSULTATION: Lung cancer  #  Oncology History Overview Note  # NOV 2021- LEFT LOWER LOBE NON-SMALL CELL CA [favor adeno ca]; T2N3 [right hilar; subcarinal LN;[name]; NOV 2021-MRI [place]  # 11/30- carbo-Taxol-RT [RT until 10/23/20]; s/p IMFINZI on 2/24. [23 months]; finished February 2023.  # #Brain metastases-March 2023-MRI-brain 2.1 cm peripherally enhancing lesion in the right paracentral lobule concerning for metastatic disease. There is mild perilesional edema and regional mass effect but no midline shift. On dexamethasone 2 mg a day.  Noted to have significant movement of the left lower extremity weakness.  Plan with SBRT on 3/22.  # SEP 20th, 2023- Keytruda [PDL-1:90%]  # Right lower lobe - ~4.6 x 3.1 cm  [PET 07-2020]- demonstrates no significant hypermetabolic activity (SUV max 1.8]-benign. STABLE.   # OCT [id number] Flock- disease in the tracheoesophageal groove-proceed with Keytruda  [PD-L1-90 percent]  # A.fib [Eliquis; Dr.Kowalski]  # # SURVIVORSHIP:   # GENETICS:   #NGS-ordered  DIAGNOSIS: Lung cancer  STAGE:   III      ;  GOALS:  cure  CURRENT/MOST RECENT THERAPY : carbo-Taxol-RT ]   Stage 4 lung cancer, left ([place])  08/14/2020 Initial Diagnosis   Cancer of lower lobe of left lung ([place])   09/02/2020 - 10/22/2020 Chemotherapy    Patient is on Treatment Plan: LUNG DURVALUMAB Q14D      10/16/2020 Cancer Staging   Staging form: Lung, AJCC 8th Edition - Clinical: Stage IIIB (cT2a, cN3, cM0) - Signed by [name], MD on 10/16/2020   12/17/2020 - 11/27/2021 Chemotherapy   Patient is on Treatment Plan : LUNG Durvalumab  q14d     07/09/2022 -  Chemotherapy   Patient is on Treatment Plan : LUNG NSCLC Pembrolizumab (200) q21d      HISTORY OF PRESENTING ILLNESS: Patient is ambulating with the wheel chair.  He is accompanied by his daughter.   [name] 89 y.o.  male patient with currently stage IV [metachronus stage III lung non-small cell lung; s/p treatment feb 2023 with Brain mets status post [facility] 2023 is here for a follow up.  At last visit patient was noted to have progressive disease on the PET scan.  Patient's treatment is on hold.   Patient reports worsening weakness and had a fall 2 days ago that was checked by EMT with no injuries.  He continues to feel poorly.  Has lost weight.  Difficulty swallowing.  Of note patient also on antibiotics- on Clarithromycin; and doxy for his atypical mycobacterial skin infection.  However notes to have redness of his right ankle.   Review of Systems  Constitutional:  Positive for malaise/fatigue. Negative for chills, diaphoresis, fever and weight loss.  HENT:  Negative for nosebleeds and sore throat.   Eyes:  Negative for double vision.  Respiratory:  Negative for hemoptysis, sputum production, shortness of breath and wheezing.   Cardiovascular:  Positive for leg swelling. Negative for chest pain, palpitations and orthopnea.  Gastrointestinal:  Negative for abdominal pain, blood in stool, constipation, diarrhea, heartburn, melena, nausea and vomiting.  Genitourinary:  Negative for dysuria and urgency.  Musculoskeletal:  Negative for back pain and joint pain.  Skin:  Negative for itching.  Neurological:  Positive for focal weakness. Negative for tingling, weakness and headaches.  Endo/Heme/Allergies:  Does not bruise/bleed easily.  Psychiatric/Behavioral:  Negative for depression. The patient is not nervous/anxious and does not have insomnia.      MEDICAL HISTORY:  Past Medical History:  Diagnosis Date   Arthritis    Dysrhythmia    A-fib   Hip pain     left   HOH (hard of hearing)    LBBB (left bundle branch block) 08/05/2020   Lung cancer ([place])    Skin cancer, basal cell     face top of head    SURGICAL HISTORY: Past Surgical History:  Procedure Laterality Date   CATARACT EXTRACTION W/PHACO Right 07/13/2017   Procedure: CATARACT EXTRACTION PHACO AND INTRAOCULAR LENS PLACEMENT ([place]);  Surgeon: Leandrew Koyanagi, MD;  Location: [place];  Service: Ophthalmology;  Laterality: Right;  IVA TOPICAL RIGHT   CATARACT EXTRACTION W/PHACO Left 01/04/2018   Procedure: CATARACT EXTRACTION PHACO AND INTRAOCULAR LENS PLACEMENT ([place]) LEFT;  Surgeon: Leandrew Koyanagi, MD;  Location: [place];  Service: Ophthalmology;  Laterality: Left;   COLONOSCOPY     FEMUR FRACTURE SURGERY Right 01/03/2022   INTRAMEDULLARY (IM) NAIL INTERTROCHANTERIC Right 01/03/2022   Procedure: INTRAMEDULLARY (IM) NAIL INTERTROCHANTRIC;  Surgeon: Corky Mull, MD;  Location: [facility];  Service: Orthopedics;  Laterality: Right;   IR IMAGING GUIDED PORT INSERTION  08/27/2020   JOINT REPLACEMENT     Left total hip Dr. Su Hoff 08-04-18   MOHS SURGERY  06/01/2022   ROTATOR CUFF REPAIR Right    SKIN CANCER EXCISION     face   TOTAL HIP ARTHROPLASTY Left 08/04/2018   Procedure: TOTAL HIP ARTHROPLASTY ANTERIOR APPROACH;  Surgeon: Frederik Pear, MD;  Location: [facility];  Service: Orthopedics;  Laterality: Left;   VIDEO BRONCHOSCOPY WITH ENDOBRONCHIAL NAVIGATION N/A 08/07/2020   Procedure: VIDEO BRONCHOSCOPY WITH ENDOBRONCHIAL NAVIGATION;  Surgeon: Ottie Glazier, MD;  Location: [facility];  Service: Thoracic;  Laterality: N/A;   VIDEO BRONCHOSCOPY WITH ENDOBRONCHIAL ULTRASOUND N/A 08/07/2020   Procedure: VIDEO BRONCHOSCOPY WITH ENDOBRONCHIAL ULTRASOUND;  Surgeon: Ottie Glazier, MD;  Location: [facility];  Service: Thoracic;  Laterality: N/A;    SOCIAL HISTORY: Social History   Socioeconomic History   Marital status: Married    Spouse name: Not on  file   Number of children: Not on file   Years of education: Not on file   Highest education level: Not on file  Occupational History   Not on file  Tobacco Use   Smoking status: Former    Packs/day: 1.00    Years: 4.00    Total pack years: 4.00    Types: Cigarettes   Smokeless tobacco: Never   Tobacco comments:    quit early 70's  Vaping Use   Vaping Use: Never used  Substance and Sexual Activity   Alcohol use: No   Drug use: No   Sexual activity: Not Currently  Other Topics Concern   Not on file  Social History Narrative   > quit 35 years; smoked for 15 years. Rare alcohol. 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    Social Determinants of Health   Financial Resource Strain: Not on file  Food Insecurity: Not on file  Transportation Needs: Not on file  Physical Activity: Not on file  Stress: Not on file  Social Connections: Not on file  Intimate Partner Violence: Not on file  FAMILY HISTORY: Family History  Problem Relation Age of Onset   Throat cancer Brother         & lung cancer    ALLERGIES:  has No Known Allergies.  MEDICATIONS:  Current Outpatient Medications  Medication Sig Dispense Refill   acetaminophen (TYLENOL) 500 MG tablet Take 1 tablet (500 mg total) by mouth every 6 (six) hours as needed. 30 tablet 0   apixaban (ELIQUIS) 5 MG TABS tablet Take 5 mg by mouth 2 (two) times daily.     clarithromycin (BIAXIN) 500 MG tablet Take 1 tablet (500 mg total) by mouth 2 (two) times daily. 60 tablet 1   doxycycline (ADOXA) 100 MG tablet Take 1 tablet (100 mg total) by mouth 2 (two) times daily. 60 tablet 1   gentamicin ointment (GARAMYCIN) 0.1 % Apply 1 Application topically 3 (three) times daily. 15 g 0   hydroxypropyl methylcellulose / hypromellose (ISOPTO TEARS / GONIOVISC) 2.5 % ophthalmic solution Place 1 drop into the left eye daily as needed for dry eyes.     levofloxacin (LEVAQUIN) 500 MG  tablet Take 1 tablet (500 mg total) by mouth daily. 30 tablet 1   lidocaine-prilocaine (EMLA) cream Apply 1 Application topically as needed. Apply small amount to port site at least 1 hour prior to it being accessed, cover with plastic wrap 30 g 1   ondansetron (ZOFRAN) 8 MG tablet One pill every 8 hours as needed for nausea/vomitting. 40 tablet 1   prochlorperazine (COMPAZINE) 10 MG tablet Take 0.5-1 tablets (5-10 mg total) by mouth every 6 (six) hours as needed for nausea or vomiting. 30 tablet 0   traZODone (DESYREL) 50 MG tablet Take 1 tablet (50 mg total) by mouth at bedtime as needed for sleep. 30 tablet 3   dronabinol (MARINOL) 2.5 MG capsule Take 1 capsule (2.5 mg total) by mouth 2 (two) times daily before a meal. (Patient not taking: Reported on 10/06/2022) 60 capsule 0   furosemide (LASIX) 20 MG tablet Take 1 tablet (20 mg total) by mouth daily. (Patient not taking: Reported on 10/06/2022) 30 tablet 3   Multiple Vitamin (MULTIVITAMIN) tablet Take 1 tablet by mouth daily. (Patient not taking: Reported on 10/06/2022)     pravastatin (PRAVACHOL) 80 MG tablet Take 80 mg by mouth at bedtime.  (Patient not taking: Reported on 10/06/2022)     saw palmetto 160 MG capsule Take 160 mg by mouth 2 (two) times daily. (Patient not taking: Reported on 10/06/2022)     No current facility-administered medications for this visit.   Facility-Administered Medications Ordered in Other Visits  Medication Dose Route Frequency Provider Last Rate Last Admin   heparin lock flush 100 UNIT/ML injection            heparin lock flush 100 unit/mL  500 Units Intravenous Once Charlaine Dalton R, MD       sodium chloride flush (NS) 0.9 % injection 10 mL  10 mL Intravenous PRN Charlaine Dalton R, MD   10 mL at 02/11/21 0835   sodium chloride flush (NS) 0.9 % injection 10 mL  10 mL Intravenous Once Borders, Kirt Boys, NP       PHYSICAL EXAMINATION: ECOG PERFORMANCE STATUS: 1 - Symptomatic but completely ambulatory  Vitals:    10/06/22 1400  BP: 101/66  Pulse: 85  Resp: 16  Temp: (!) 96.1 F (35.6 C)  SpO2: 100%   Filed Weights   Bilateral upper lower extremity boils noted.  Physical Exam HENT:  Head: Normocephalic and atraumatic.     Mouth/Throat:     Pharynx: No oropharyngeal exudate.  Eyes:     Pupils: Pupils are equal, round, and reactive to light.  Cardiovascular:     Rate and Rhythm: Normal rate and regular rhythm.  Pulmonary:     Effort: Pulmonary effort is normal. No respiratory distress.     Breath sounds: No wheezing.     Comments: Slightly decreased breath sounds left lower lung base. Abdominal:     General: Bowel sounds are normal. There is no distension.     Palpations: Abdomen is soft. There is no mass.     Tenderness: There is no abdominal tenderness. There is no guarding or rebound.  Musculoskeletal:        General: No tenderness. Normal range of motion.     Cervical back: Normal range of motion and neck supple.  Skin:    General: Skin is warm.  Neurological:     Mental Status: He is alert and oriented to person, place, and time.  Psychiatric:        Mood and Affect: Affect normal.      LABORATORY DATA:  I have reviewed the data as listed Lab Results  Component Value Date   WBC 9.7 10/06/2022   HGB 13.0 10/06/2022   HCT 38.6 (L) 10/06/2022   MCV 91.9 10/06/2022   PLT 391 10/06/2022   Recent Labs    09/01/22 0911 09/13/22 1240 10/06/22 1413  NA 133* 132* 134*  K 4.0 3.7 3.9  CL 99 95* 99  CO2 _0 GLUCOSE 155* 181* 121*  BUN _1 CREATININE 0.75 0.66 0.69  CALCIUM 8.8* 8.3* 8.1*  GFRNONAA >60 >60 >60  PROT 6.9 6.8 6.4*  ALBUMIN 3.3* 3.3* 3.2*  AST _2 ALT _3 ALKPHOS 162* 194* 173*  BILITOT 0.8 0.7 0.9    RADIOGRAPHIC STUDIES: I have personally reviewed the radiological images as listed and agreed with the findings in the report. NM PET Image Restage (PS) Skull Base to Thigh (F-18 FDG)  Result Date: 09/11/2022 CLINICAL  DATA:  Subsequent treatment strategy for recurrent non-small cell lung cancer. EXAM: NUCLEAR MEDICINE PET SKULL BASE TO THIGH TECHNIQUE: 7.99 mCi F-18 FDG was injected intravenously. Full-ring PET imaging was performed from the skull base to thigh after the radiotracer. CT data was obtained and used for attenuation correction and anatomic localization. Fasting blood glucose: 104 mg/dl COMPARISON:  05/27/2022 FINDINGS: Mediastinal blood pool activity: SUV max 1.67 Liver activity: SUV max NA NECK: No hypermetabolic lymph nodes in the neck. Incidental CT findings: None. CHEST: No tracer avid supraclavicular or axillary lymph nodes. Mediastinal lymph node posterior to the trachea measures 2.5 by 3.0 cm and has an SUV max of 5.33, image 81/2. On the previous exam this measured 2.2 x 2.4 cm with SUV max of 3.81. No additional tracer avid mediastinal or hilar lymph nodes. No tracer avid pulmonary nodule or mass. Incidental CT findings: Left upper lobe perihilar and paramediastinal radiation change is identified. Consolidation and architectural distortion within the perihilar right lower lobe is also again seen and appears unchanged presumed to represent post treatment changes. There is a small left pleural effusion which appears similar to the previous exam. Aortic atherosclerosis and coronary artery calcifications. Mild cardiac enlargement. No pericardial effusion. ABDOMEN/PELVIS: No abnormal tracer uptake within the liver, pancreas, spleen, or adrenal glands. No tracer avid abdominopelvic lymph nodes. Incidental CT findings: Aortic atherosclerosis.  Right posterolateral bladder diverticula is again noted. SKELETON: Tracer avid lesion involving the left olecranon is again seen with associated underlying lytic lesion. The lytic lesion measures 1.1 cm and has an SUV max of 5.71, image 162/2. Left knee. Previously 0.9 cm with SUV max of 6.28. Previously noted tracer avid cutaneous nodules within the left upper arm are no  longer visualized. Incidental CT findings: Vertebral plana deformity is again seen at the T8 level. Mild compression fracture involving the L1 vertebra is identified and appears unchanged from prior exam. IMPRESSION: 1. Mild increase in size and degree of FDG uptake associated with the metastatic mediastinal lymph node posterior to the trachea. 2. Similar size and degree of uptake associated with the tracer avid lytic lesion involving the left olecranon. 3. No new sites of disease. 4.  Aortic Atherosclerosis (ICD10-I70.0). Electronically Signed   By: Kerby Moors M.D.   On: 09/11/2022 15:23   MR BRAIN W WO CONTRAST  Result Date: 09/10/2022 CLINICAL DATA:  Brain metastases suspected, lung cancer EXAM: MRI HEAD WITHOUT AND WITH CONTRAST TECHNIQUE: Multiplanar, multiecho pulse sequences of the brain and surrounding structures were obtained without and with intravenous contrast. CONTRAST:  39m GADAVIST GADOBUTROL 1 MMOL/ML IV SOLN COMPARISON:  05/11/2022 FINDINGS: Brain: Further interval decrease in the size of a peripherally enhancing lesion in the posteromedial right frontal lobe, which now measures 5 x 10 x 9 mm (AP x TR x CC) (series 19, image 132 and series 20, image 12), previously 9 x 9 x 9 mm. Slightly decreased associated edema. No new enhancing lesions to suggest additional metastatic disease. The aforementioned lesion is associated with some restricted diffusion. No other foci of restricted diffusion to suggest acute or subacute infarct. No acute hemorrhage, mass, mass effect, or midline shift. No hydrocephalus or extra-axial collection. Unchanged punctate microhemorrhages in the left temporal lobe and right parietal lobe. T2 hyperintense signal in the periventricular white matter, likely the sequela of mild chronic small vessel ischemic disease. Vascular: Normal arterial flow voids. Normal arterial and venous enhancement. Skull and upper cervical spine: Normal marrow signal. Sinuses/Orbits: Clear  paranasal sinuses. Status post bilateral lens replacements. Other: The mastoids are well aerated. IMPRESSION: 1. Further interval decrease in the size of a peripherally enhancing lesion in the posteromedial right frontal lobe, with slightly decreased associated edema. 2. No new enhancing lesions to suggest additional metastatic disease. Electronically Signed   By: AMerilyn BabaM.D.   On: 09/10/2022 16:00     ASSESSMENT & PLAN:   Stage 4 lung [place] #Stage IV- -non-small cell lung cancer-[favor adenocarcinoma] [at dx-stage III- s/p  concurrent chemoradiation; s/p  adjuvant durvalumab; finished Feb 2023]; with metachronous brain metastases [March 2023];   AUG-2023-CT/PET scan-  Tracheal esophageal lymph node displays moderate increased metabolic activity in signs of central necrosis compatible with metastatic lymph node. Signs of bony metastasis to the LEFT olecranon.  Patient currently on single agent Keytruda [ PD-L1-90 percent]. currently s/p keytruda cycle #3; however September 11, 2022 PET scan shows progressive metastatic mediastinal lymph node; lytic lesion of the left olecranon.  # Recommend discontinuation of therapy given the progressive disease; declining performance status-and the inability to withstand any aggressive chemotherapeutic regimens.  I would recommend continuation of hospice.  # Brain metastases--s/p SBRT [March, 23rrd-2023] MRI- DEC 2023- Further interval decrease in the size of a peripherally enhancing lesion in the posteromedial right frontal lobe, with slightly decreased associated edema;  No new enhancing lesions to suggest additional metastatic disease.  Given the recent falls possible progression/recurrence-however hold off any further workup.  Continue hospice.  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 Right ankle-redness new  cellutis- Keflex x2  weeks-defer to Dr.Ravishankar for further recommendations.  # A. fib on Eliquis [Dr.Kowalski]-  Mild swelling in legs- EF in Feb 2022- 55-60%- STABLE.  Discussed regarding importance of avoiding falls while on anticoagulation.  # MediPort- functional stable; port flush continue under hospice..  # PROGNOSIS: Discussed that the life expectancy on average is few months.  Patient states that he is at peace with his current situation.  Patient will continue hospice for now.  Patient will follow-up with [place] only as needed.  # DISPOSITION: # DE-Access; NO keytruda  # follow up needed- Dr.B      All questions were answered. The patient knows to call the clinic with any problems, questions or concerns.   [name], MD 10/06/2022 3:28 PM

## 2022-10-06 NOTE — Progress Notes (Signed)
Patient reports worsening weakness and had a fall 2 days ago that was checked by EMT with no injuries.    Under Hospice Care at home and nurse comes out on Wed and Fri.

## 2022-10-07 ENCOUNTER — Encounter: Payer: Self-pay | Admitting: Internal Medicine

## 2022-10-07 ENCOUNTER — Other Ambulatory Visit: Payer: Self-pay

## 2022-10-07 MED ORDER — TRAZODONE HCL 50 MG PO TABS: 50.0000 mg | ORAL_TABLET | Freq: Every evening | ORAL | 3 refills | Status: DC | PRN

## 2022-10-14 ENCOUNTER — Encounter: Payer: Self-pay | Admitting: Infectious Diseases

## 2022-11-04 ENCOUNTER — Encounter: Payer: Self-pay | Admitting: Infectious Diseases

## 2022-11-05 ENCOUNTER — Other Ambulatory Visit: Payer: Self-pay

## 2022-11-05 ENCOUNTER — Other Ambulatory Visit (HOSPITAL_COMMUNITY): Payer: Self-pay

## 2022-11-05 ENCOUNTER — Encounter: Payer: Self-pay | Admitting: Internal Medicine

## 2022-11-05 NOTE — Telephone Encounter (Signed)
I ran a test claim for the liquid and the copay for the Biaxin 500mg  a 30 day supply is $631.61.

## 2022-11-05 NOTE — Progress Notes (Deleted)
I spoke to Jason Warren patient's daughter and advised her the pricing of the liquid antibiotics and that was part of their concerns the last time Dr. Delaine Lame sent in liquids for him. Patient's daughter stated they will hold off on changing the medications for now and discuss with Dr. Delaine Lame. I have also reached out to [facility] with pharmacy team to see if patient antibiotics can be crushed. Koby [place] [name]

## 2022-11-05 NOTE — Telephone Encounter (Signed)
I spoke to Jason Warren patient's daughter and advised her the pricing of the liquid antibiotics and that was part of their concerns the last time Dr. Delaine Lame sent in liquids for him. Patient's daughter stated they will hold off on changing the medications for now and discuss with Dr. Delaine Lame. I have also reached out to [facility] with pharmacy team to see if patient antibiotics can be crushed. Jason Warren [name]

## 2022-11-05 NOTE — Telephone Encounter (Signed)
Per Cassie patient can crush levaquin and doxy.. but shouldnt really crush clarithromycin.  I have advised the patient's daughter of instructions she has agreed to this and stated they will try this until visit with Dr. Delaine Lame on 11/11/22.  Jason Warren [name]

## 2022-11-08 ENCOUNTER — Other Ambulatory Visit: Payer: Self-pay | Admitting: Internal Medicine

## 2022-11-11 ENCOUNTER — Telehealth: Payer: Medicare Other | Admitting: Infectious Diseases

## 2022-11-16 ENCOUNTER — Telehealth: Payer: Self-pay | Admitting: *Deleted

## 2022-11-16 ENCOUNTER — Encounter: Payer: Self-pay | Admitting: Internal Medicine

## 2022-11-16 NOTE — Telephone Encounter (Signed)
Joy with Hospice called asking if patient 3 antibiotics for his cellulitis can be ordered under Dr B now/ Clarithromycin 500 mg twice a day, Doxycycline 100 mg twice a day, and Levofloxacin 500 mg daily

## 2022-11-16 NOTE — Telephone Encounter (Signed)
I spoke with Caryl Asp, RN. Okay to continue abx as directed by ID.

## 2022-11-17 ENCOUNTER — Encounter: Payer: Self-pay | Admitting: Internal Medicine

## 2022-11-17 ENCOUNTER — Other Ambulatory Visit: Payer: Self-pay

## 2022-11-17 MED ORDER — ONDANSETRON HCL 8 MG PO TABS: ORAL_TABLET | ORAL | 1 refills | Status: DC

## 2022-11-18 ENCOUNTER — Ambulatory Visit: Attending: Infectious Diseases | Admitting: Infectious Diseases

## 2022-11-18 DIAGNOSIS — A311 Cutaneous mycobacterial infection: Secondary | ICD-10-CM

## 2022-11-18 DIAGNOSIS — C3490 Malignant neoplasm of unspecified part of unspecified bronchus or lung: Secondary | ICD-10-CM | POA: Diagnosis not present

## 2022-11-18 NOTE — Progress Notes (Signed)
The purpose of this virtual visit is to provide medical care while limiting exposure to the novel coronavirus (COVID19) for both patient and office staff.   Consent was obtained for video visit:  Yes.   Answered questions that patient had about telehealth interaction:  Yes.   I discussed the limitations, risks, security and privacy concerns of performing an evaluation and management service by telephone. I also discussed with the patient that there may be a patient responsible charge related to this service. The patient expressed understanding and agreed to proceed.   Patient Location: Home Provider Location:office Wife, daughter and patient , provider on this visit  Here for follow up of Mycobacterium chelonae infection of skin and soft tissue of extremities- on clarithromycin, Levaquin and doxy Taking it after crushing the meds He has metastatic carcinoma lung but now hospice care  He says he cannot take solid food- on liquids Says the lesions on the legs are drying up and shrinking- couple of new nodules but not getting bigger No fever or chills  O/e pt is in no distress, frail, pale No oral cavity lesions  Lower extremities Multiple purplish nodules scattered over extremities Some of them are regressing on his thighs Some of them have scales- no bleeding  Mycobacterium chelonae skin and soft tissue infection PT would need another month or two of  triple antibiotic. He says he will continue He will let me know if he changes his mind May see him remotely in 2 months- they will call and make appt  Metastatic carcinoma lung- hospice care  Total time spent on this video call- 15 min

## 2022-11-29 ENCOUNTER — Telehealth: Payer: Self-pay

## 2022-11-29 MED ORDER — LEVOFLOXACIN 500 MG PO TABS: 500.0000 mg | ORAL_TABLET | Freq: Every day | ORAL | 2 refills | Status: DC

## 2022-11-29 MED ORDER — DOXYCYCLINE MONOHYDRATE 100 MG PO TABS: 100.0000 mg | ORAL_TABLET | Freq: Two times a day (BID) | ORAL | 2 refills | Status: DC

## 2022-11-29 MED ORDER — CLARITHROMYCIN 500 MG PO TABS: 500.0000 mg | ORAL_TABLET | Freq: Two times a day (BID) | ORAL | 2 refills | Status: DC

## 2022-11-29 NOTE — Addendum Note (Signed)
Addended by: Lucie Leather D on: 11/29/2022 03:10 PM   Modules accepted: Orders

## 2022-11-29 NOTE — Telephone Encounter (Signed)
Received voicemail from [place], patient's case manager, asking for refill of levofloxacin.   P: [phone]  Beryle Flock, RN

## 2022-11-29 NOTE — Telephone Encounter (Signed)
Spoke with Joy, patient needs 15 day supply with 2 refills at a time in order for medications to be covered. Confirms Julio is taking all three antibiotics. Will send to [facility] [address] as requested.   Beryle Flock, RN

## 2023-02-02 ENCOUNTER — Encounter: Payer: Self-pay | Admitting: Internal Medicine

## 2023-02-02 NOTE — Progress Notes (Signed)
Called and offered condolences to voicemail.  Unable to reach the daughter.

## 2023-02-02 DEATH — deceased

## 2023-10-13 ENCOUNTER — Telehealth: Payer: Medicare Other | Admitting: Infectious Diseases
